# Patient Record
Sex: Female | Born: 1980 | Race: White | Hispanic: No | Marital: Married | Smoking: Former smoker
Health system: Southern US, Community
[De-identification: ages and names within clinical notes are randomized; demographics above are authoritative.]

## PROBLEM LIST (undated history)

## (undated) ENCOUNTER — Inpatient Hospital Stay (HOSPITAL_COMMUNITY): Payer: Self-pay

## (undated) DIAGNOSIS — Z803 Family history of malignant neoplasm of breast: Secondary | ICD-10-CM

## (undated) DIAGNOSIS — Z800 Family history of malignant neoplasm of digestive organs: Secondary | ICD-10-CM

## (undated) DIAGNOSIS — C50912 Malignant neoplasm of unspecified site of left female breast: Secondary | ICD-10-CM

## (undated) DIAGNOSIS — F419 Anxiety disorder, unspecified: Secondary | ICD-10-CM

## (undated) HISTORY — DX: Family history of malignant neoplasm of digestive organs: Z80.0

## (undated) HISTORY — DX: Family history of malignant neoplasm of breast: Z80.3

---
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
## 2000-05-28 HISTORY — PX: WISDOM TOOTH EXTRACTION: SHX21

## 2001-11-04 ENCOUNTER — Emergency Department (HOSPITAL_COMMUNITY): Admission: EM | Admit: 2001-11-04 | Discharge: 2001-11-04 | Payer: Self-pay | Admitting: Emergency Medicine

## 2002-06-02 ENCOUNTER — Other Ambulatory Visit: Admission: RE | Admit: 2002-06-02 | Discharge: 2002-06-02 | Payer: Self-pay | Admitting: Obstetrics and Gynecology

## 2003-07-14 ENCOUNTER — Other Ambulatory Visit: Admission: RE | Admit: 2003-07-14 | Discharge: 2003-07-14 | Payer: Self-pay | Admitting: Obstetrics and Gynecology

## 2004-08-29 ENCOUNTER — Other Ambulatory Visit: Admission: RE | Admit: 2004-08-29 | Discharge: 2004-08-29 | Payer: Self-pay | Admitting: Obstetrics and Gynecology

## 2005-09-17 ENCOUNTER — Other Ambulatory Visit: Admission: RE | Admit: 2005-09-17 | Discharge: 2005-09-17 | Payer: Self-pay | Admitting: Obstetrics and Gynecology

## 2006-12-18 ENCOUNTER — Emergency Department (HOSPITAL_COMMUNITY): Admission: EM | Admit: 2006-12-18 | Discharge: 2006-12-18 | Payer: Self-pay | Admitting: Emergency Medicine

## 2008-03-26 ENCOUNTER — Encounter: Payer: Self-pay | Admitting: Obstetrics and Gynecology

## 2008-03-26 ENCOUNTER — Other Ambulatory Visit: Admission: RE | Admit: 2008-03-26 | Discharge: 2008-03-26 | Payer: Self-pay | Admitting: Obstetrics and Gynecology

## 2008-03-26 ENCOUNTER — Ambulatory Visit: Payer: Self-pay | Admitting: Obstetrics and Gynecology

## 2009-06-09 ENCOUNTER — Other Ambulatory Visit: Admission: RE | Admit: 2009-06-09 | Discharge: 2009-06-09 | Payer: Self-pay | Admitting: Obstetrics and Gynecology

## 2009-06-09 ENCOUNTER — Ambulatory Visit: Payer: Self-pay | Admitting: Obstetrics and Gynecology

## 2010-06-12 ENCOUNTER — Other Ambulatory Visit
Admission: RE | Admit: 2010-06-12 | Discharge: 2010-06-12 | Payer: Self-pay | Source: Home / Self Care | Admitting: Obstetrics and Gynecology

## 2010-06-12 ENCOUNTER — Other Ambulatory Visit: Payer: Self-pay | Admitting: Obstetrics and Gynecology

## 2010-06-12 ENCOUNTER — Ambulatory Visit
Admission: RE | Admit: 2010-06-12 | Discharge: 2010-06-12 | Payer: Self-pay | Source: Home / Self Care | Attending: Obstetrics and Gynecology | Admitting: Obstetrics and Gynecology

## 2011-07-09 ENCOUNTER — Encounter: Payer: Self-pay | Admitting: Gynecology

## 2011-07-09 DIAGNOSIS — B001 Herpesviral vesicular dermatitis: Secondary | ICD-10-CM | POA: Insufficient documentation

## 2011-07-13 ENCOUNTER — Ambulatory Visit (INDEPENDENT_AMBULATORY_CARE_PROVIDER_SITE_OTHER): Payer: BC Managed Care – PPO | Admitting: Obstetrics and Gynecology

## 2011-07-13 ENCOUNTER — Encounter: Payer: Self-pay | Admitting: Obstetrics and Gynecology

## 2011-07-13 DIAGNOSIS — N912 Amenorrhea, unspecified: Secondary | ICD-10-CM

## 2011-07-13 NOTE — Progress Notes (Signed)
Patient came to see me today having not had a cycle since 05/28/2011. She is therefore about 7 weeks from the first day of her last period. She has not used contraception for one year. She had 3 positive pregnancy tests at home.  Pelvic exam: External within normal limits. BUS within normal limits. Vaginal exam within normal limits. Cervix is clean without lesions. Uterus is normal size and shape but soft.  Adnexa failed to reveal masses. Rectovaginal examination is confirmatory and without masses. Kennon Portela present.  Assessment: Probable early pregnancy.  Plan: Quantitative hCG done. We will call patient with results. Patient started on prenatal vitamins.

## 2011-07-14 LAB — HCG, QUANTITATIVE, PREGNANCY: hCG, Beta Chain, Quant, S: 1922.7 m[IU]/mL

## 2011-07-16 ENCOUNTER — Other Ambulatory Visit: Payer: Self-pay | Admitting: *Deleted

## 2011-07-16 DIAGNOSIS — N912 Amenorrhea, unspecified: Secondary | ICD-10-CM

## 2011-07-18 ENCOUNTER — Ambulatory Visit (INDEPENDENT_AMBULATORY_CARE_PROVIDER_SITE_OTHER): Payer: BC Managed Care – PPO

## 2011-07-18 ENCOUNTER — Ambulatory Visit (INDEPENDENT_AMBULATORY_CARE_PROVIDER_SITE_OTHER): Payer: BC Managed Care – PPO | Admitting: Obstetrics and Gynecology

## 2011-07-18 DIAGNOSIS — O365990 Maternal care for other known or suspected poor fetal growth, unspecified trimester, not applicable or unspecified: Secondary | ICD-10-CM

## 2011-07-18 DIAGNOSIS — N912 Amenorrhea, unspecified: Secondary | ICD-10-CM

## 2011-07-18 LAB — US OB TRANSVAGINAL

## 2011-07-18 NOTE — Progress Notes (Signed)
Patient came back to see me today for early pregnancy ultrasound. She is not sure of her LMP. She had a reassuring Quant. On ultrasound today she has a five-week four-day pregnancy. It is intrauterine. No fetal pole seen. No fetal heart motion seen. The yolk sac appears normal. The patient has a corpus luteal cyst on the right ovary. Her left ovary is normal. Her cul-de-sac is free of fluid.  Assessment: Early intrauterine pregnancy  Plan:  followup ultrasound in 2 weeks.

## 2011-08-01 ENCOUNTER — Ambulatory Visit (INDEPENDENT_AMBULATORY_CARE_PROVIDER_SITE_OTHER): Payer: BC Managed Care – PPO

## 2011-08-01 ENCOUNTER — Ambulatory Visit (INDEPENDENT_AMBULATORY_CARE_PROVIDER_SITE_OTHER): Payer: BC Managed Care – PPO | Admitting: Obstetrics and Gynecology

## 2011-08-01 DIAGNOSIS — N912 Amenorrhea, unspecified: Secondary | ICD-10-CM

## 2011-08-01 DIAGNOSIS — Z1389 Encounter for screening for other disorder: Secondary | ICD-10-CM

## 2011-08-01 DIAGNOSIS — Z3687 Encounter for antenatal screening for uncertain dates: Secondary | ICD-10-CM

## 2011-08-01 LAB — US OB TRANSVAGINAL

## 2011-08-01 NOTE — Progress Notes (Signed)
Patient came back today for followup ultrasound. She was unsure of her dates and we did her first ultrasound 2 weeks ago there was an intrauterine pregnancy without fetal heart activity. Today there is been 2 weeks of growth and the patient is 7 weeks 4 days by ultrasound. There is now good fetal heart rate activity. The cervix is long and closed. A fetal pole was seen. A normal yolk sac is seen. A corpus luteal cyst on the right ovary was seen. The left ovary is normal. The cul-de-sac is normal. Patient's EDC was calculated based on ultrasound to be 03/15/2012. Patient and her husband were informed of all the above. She is doing well. She was given names of obstetricians. She will make an appointment. We discussed noninvasive genetic testing and she will discuss it with her new obstetrician.

## 2011-08-07 LAB — OB RESULTS CONSOLE HEPATITIS B SURFACE ANTIGEN: Hepatitis B Surface Ag: NEGATIVE

## 2011-08-07 LAB — OB RESULTS CONSOLE ANTIBODY SCREEN: Antibody Screen: NEGATIVE

## 2011-08-07 LAB — OB RESULTS CONSOLE RPR: RPR: NONREACTIVE

## 2011-08-07 LAB — OB RESULTS CONSOLE ABO/RH: RH Type: POSITIVE

## 2011-11-19 ENCOUNTER — Encounter (HOSPITAL_COMMUNITY): Payer: Self-pay | Admitting: *Deleted

## 2011-11-19 ENCOUNTER — Inpatient Hospital Stay (HOSPITAL_COMMUNITY)
Admission: AD | Admit: 2011-11-19 | Discharge: 2011-11-19 | Disposition: A | Discharge status: Home / Self Care | Payer: BC Managed Care – PPO | Source: Ambulatory Visit | Attending: Obstetrics and Gynecology | Admitting: Obstetrics and Gynecology

## 2011-11-19 ENCOUNTER — Inpatient Hospital Stay (HOSPITAL_COMMUNITY): Payer: BC Managed Care – PPO

## 2011-11-19 DIAGNOSIS — R1011 Right upper quadrant pain: Secondary | ICD-10-CM | POA: Insufficient documentation

## 2011-11-19 DIAGNOSIS — O212 Late vomiting of pregnancy: Secondary | ICD-10-CM | POA: Insufficient documentation

## 2011-11-19 DIAGNOSIS — O99891 Other specified diseases and conditions complicating pregnancy: Secondary | ICD-10-CM | POA: Insufficient documentation

## 2011-11-19 HISTORY — DX: Anxiety disorder, unspecified: F41.9

## 2011-11-19 LAB — COMPREHENSIVE METABOLIC PANEL
Albumin: 3.1 g/dL — ABNORMAL LOW (ref 3.5–5.2)
Alkaline Phosphatase: 68 U/L (ref 39–117)
BUN: 7 mg/dL (ref 6–23)
Potassium: 4 mEq/L (ref 3.5–5.1)
Sodium: 133 mEq/L — ABNORMAL LOW (ref 135–145)
Total Protein: 6.3 g/dL (ref 6.0–8.3)

## 2011-11-19 LAB — CBC
HCT: 30.4 % — ABNORMAL LOW (ref 36.0–46.0)
MCHC: 34.5 g/dL (ref 30.0–36.0)
RDW: 13.1 % (ref 11.5–15.5)

## 2011-11-19 MED ORDER — DEXTROSE 5 % AND 0.9 % NACL IV BOLUS
1000.0000 mL | Freq: Once | INTRAVENOUS | Status: AC
  Administered 2011-11-19: 1000 mL via INTRAVENOUS
  Filled 2011-11-19: qty 1000

## 2011-11-19 MED ORDER — FAMOTIDINE IN NACL 20-0.9 MG/50ML-% IV SOLN
20.0000 mg | Freq: Once | INTRAVENOUS | Status: AC
  Administered 2011-11-19: 20 mg via INTRAVENOUS
  Filled 2011-11-19: qty 50

## 2011-11-19 MED ORDER — ONDANSETRON HCL 4 MG/2ML IJ SOLN
4.0000 mg | Freq: Once | INTRAMUSCULAR | Status: AC
  Administered 2011-11-19: 4 mg via INTRAVENOUS
  Filled 2011-11-19: qty 2

## 2011-11-19 MED ORDER — ACETAMINOPHEN 325 MG PO TABS
650.0000 mg | ORAL_TABLET | Freq: Once | ORAL | Status: AC
  Administered 2011-11-19: 650 mg via ORAL
  Filled 2011-11-19: qty 2

## 2011-11-19 NOTE — MAU Provider Note (Signed)
  History    CSN: [id number], Arrival date and time: 11/19/11 0220  HPI G1 at [redacted] wks gestation. C/o sudden onset RUQ pain, nausea vomiting x 3 episodes since last evening. Ate Mac/cheese and fried Chicken at 6 pm, noted pain since, tried Tums, Pepcid since felt like epigastric fullness and heartburn initially but moved to RUQ then. No fever/ chills.  No prior gall stones or liver problems.  [facility] at [facility] (Dr Billy Coast) since 1st trim.   Past Medical History  Diagnosis Date  . Fever blister   . Anxiety     Past Surgical History  Procedure Date  . Wisdom tooth extraction     Family History  Problem Relation Age of Onset  . Diabetes Mother     History  Substance Use Topics  . Smoking status: Current Everyday Smoker -- 0.5 packs/day  . Smokeless tobacco: Not on file  . Alcohol Use: No    Allergies: No Known Allergies  Prescriptions prior to admission  Medication Sig Dispense Refill  . calcium carbonate (TUMS - DOSED IN MG ELEMENTAL CALCIUM) 500 MG chewable tablet Chew 2 tablets by mouth 2 (two) times daily.      . Escitalopram Oxalate (LEXAPRO PO) Take 15 mg by mouth daily.       . Famotidine (PEPCID PO) Take by mouth.      . lansoprazole (PREVACID) 30 MG capsule Take 30 mg by mouth daily.      . Prenatal Vit-Fe Fumarate-FA (MULTIVITAMIN-PRENATAL) 27-0.8 MG TABS Take 1 tablet by mouth daily.      Marland Kitchen ALPRAZolam (XANAX) 0.5 MG tablet Take 0.5 mg by mouth at bedtime as needed.      . ValACYclovir HCl (VALTREX PO) Take by mouth.        ROS No fever/ chills.  Physical Exam   Blood pressure 141/69, pulse 81, temperature 97.9 F (36.6 C), temperature source Oral, resp. rate 18, height 5\' 5"  (1.651 m), weight 107.049 kg (236 lb), last menstrual period 05/28/2011, SpO2 100.00%.  Physical Exam  A&O x 3, no acute distress. Pleasant HEENT: neg, no thyromegaly Lungs: CTA bilat CV: RRR, S1S2 normal Abdo: RUQ tenderness, but Murphy's sign neg. No rebound/guarding/rigidity.  Uterus soft, gravid, no contractions Extr: no edema/ tenderness Pelvic: deferred.  FHT: Present Toco: none  MAU Course  Procedures CBC, CMP reviewed. Elevated WBC.  RUQ sono- Gall stone (small), no e/o acute cholecystitis.   Assessment and Plan  IV hydration with D5NS (2 lit) Zofran, Famotidine, NPO. RUQ sono- reviewed.  Tolerated PO challenge, pain decreased significantly.   Plan-  F/up Gen Surgeon in next few days (office will call pt back with appointment Bland, no fat diet reviewed.  Pain mngmt with Tylenol Reportable symptoms incl fever/ worsening pain/nausea -vomiting and Ob related symptoms incl contractions/ bleeding/ leaking of fluid/ decreased fetal movements reviewed. Risk of PTL with any infection reviewed, pt voiced understanding.    Kyaira Trantham R 11/19/2011, 6:48 AM

## 2011-11-19 NOTE — MAU Note (Signed)
PT SAYS SHE HAS  UPPER  ABD AND MID- CHEST PAIN AT 7 PM-  TOOK TUMS-  VOMITED.  AT 10PM - CALLED OFFICE  - TOLD TO TAKE PREVACID- SHE DID .  NOW    PAIN IS  UPPER R QUAD-

## 2011-11-19 NOTE — Discharge Instructions (Signed)
Cholecystitis   Cholecystitis is swelling and irritation (inflammation) of your gallbladder. This often happens when gallstones or sludge build up in the gallbladder. Treatment is needed right away.  HOME CARE  Home care depends on how you were treated. In general:   If you were given antibiotic medicine, take it as told. Finish the medicine even if you start to feel better.   Only take medicines as told by your doctor.   Eat low-fat foods until your next doctor visit.   Keep all doctor visits as told.  GET HELP RIGHT AWAY IF:   You have more pain and medicine does not help.   Your pain moves to a different part of your belly (abdomen) or to your back.   You have a fever.   You feel sick to your stomach (nauseous).   You throw up (vomit).  MAKE SURE YOU:   Understand these instructions.   Will watch your condition.   Will get help right away if you are not doing well or get worse.  Document Released: 05/03/2011 Document Reviewed: 05/01/2011  [employer] Patient Information 2012 [employer].

## 2011-11-20 ENCOUNTER — Encounter (INDEPENDENT_AMBULATORY_CARE_PROVIDER_SITE_OTHER): Payer: Self-pay | Admitting: General Surgery

## 2011-11-20 ENCOUNTER — Encounter (INDEPENDENT_AMBULATORY_CARE_PROVIDER_SITE_OTHER): Payer: Self-pay | Admitting: Surgery

## 2011-11-20 ENCOUNTER — Ambulatory Visit (INDEPENDENT_AMBULATORY_CARE_PROVIDER_SITE_OTHER): Payer: BC Managed Care – PPO | Admitting: Surgery

## 2011-11-20 VITALS — BP 132/74 | HR 72 | Temp 97.4°F | Resp 14 | Ht 66.5 in | Wt 230.0 lb

## 2011-11-20 DIAGNOSIS — K8020 Calculus of gallbladder without cholecystitis without obstruction: Secondary | ICD-10-CM | POA: Insufficient documentation

## 2011-11-20 NOTE — Progress Notes (Signed)
Patient ID: [name], female   DOB: 1981/03/14, 31 y.o.   MRN: [id number]  Chief Complaint  Patient presents with  . Cholelithiasis    HPI [name] is a 31 y.o. female.  She is referred by Dr. Billy Coast for symptomatic cholelithiasis. She is starting her 24 week pregnancy. She had an attack of right upper quadrant abdominal pain over the weekend with nausea vomiting and diarrhea. Gallstones were diagnosed on ultrasound. Since then she has been much better and only has minimal discomfort. The pain was sharp and did not refer any where else. It was moderate to severe when it began HPI  Past Medical History  Diagnosis Date  . Fever blister   . Anxiety   . Abdominal pain   . Diarrhea   . Nausea & vomiting   . Gallstones     Past Surgical History  Procedure Date  . Wisdom tooth extraction 2002    Family History  Problem Relation Age of Onset  . Diabetes Mother   . Cancer Mother     no sure of tupe, was on her shoulder blade    Social History History  Substance Use Topics  . Smoking status: Current Everyday Smoker -- 1.0 packs/day    Types: Cigarettes  . Smokeless tobacco: Never Used  . Alcohol Use: No    No Known Allergies  Current Outpatient Prescriptions  Medication Sig Dispense Refill  . calcium carbonate (TUMS - DOSED IN MG ELEMENTAL CALCIUM) 500 MG chewable tablet Chew 2 tablets by mouth as needed.       . Escitalopram Oxalate (LEXAPRO PO) Take 15 mg by mouth daily.       . lansoprazole (PREVACID) 30 MG capsule Take 30 mg by mouth daily.      . Prenatal Vit-Fe Fumarate-FA (MULTIVITAMIN-PRENATAL) 27-0.8 MG TABS Take 1 tablet by mouth daily.        Review of Systems Review of Systems  Constitutional: Negative for fever, chills and unexpected weight change.  HENT: Negative for hearing loss, congestion, sore throat, trouble swallowing and voice change.   Eyes: Negative for visual disturbance.  Respiratory: Negative for cough and wheezing.     Cardiovascular: Negative for chest pain, palpitations and leg swelling.  Gastrointestinal: Positive for nausea, vomiting, abdominal pain and diarrhea. Negative for constipation, blood in stool, abdominal distention and anal bleeding.  Genitourinary: Negative for hematuria, vaginal bleeding and difficulty urinating.  Musculoskeletal: Negative for arthralgias.  Skin: Negative for rash and wound.  Neurological: Negative for seizures, syncope and headaches.  Hematological: Negative for adenopathy. Does not bruise/bleed easily.  Psychiatric/Behavioral: Negative for confusion.    Blood pressure 132/74, pulse 72, temperature 97.4 F (36.3 C), temperature source Temporal, resp. rate 14, height 5' 6.5" (1.689 m), weight 230 lb (104.327 kg), last menstrual period 05/28/2011.  Physical Exam Physical Exam  Constitutional: She is oriented to person, place, and time. She appears well-developed and well-nourished. No distress.       Gravid  HENT:  Head: Normocephalic and atraumatic.  Right Ear: External ear normal.  Left Ear: External ear normal.  Nose: Nose normal.  Mouth/Throat: Oropharynx is clear and moist.  Eyes: Conjunctivae are normal. Right eye exhibits no discharge.  Neck: Normal range of motion. No tracheal deviation present. No thyromegaly present.  Cardiovascular: Normal rate, regular rhythm, normal heart sounds and intact distal pulses.   No murmur heard. Pulmonary/Chest: Effort normal and breath sounds normal. No respiratory distress. She has no wheezes. She has no rales.  Abdominal: Soft. Bowel sounds are normal.       Her abdomen is gravid. It is soft. There is minimal right upper quadrant tenderness and no guarding  Musculoskeletal: Normal range of motion. She exhibits no edema and no tenderness.  Lymphadenopathy:    She has no cervical adenopathy.  Neurological: She is alert and oriented to person, place, and time.  Skin: Skin is warm and dry. No rash noted. No erythema.   Psychiatric: Her behavior is normal. Judgment normal.    Data Reviewed Ultrasound shows cholelithiasis. There is no gallbladder wall thickening and the bile ducts are normal. Liver function tests are normal  Assessment    Symptomatic cholelithiasis    Plan    I am going to try to treat this conservatively and will start her on antibiotics. I will see her back in one week. Hopefully we can avoid cholecystectomy in the risk of preterm labor. Should she have another attack, she will proceed to [facility] and we will change plans as needed       Jalena Vanderlinden A 11/20/2011, 10:54 AM

## 2011-11-23 ENCOUNTER — Telehealth (INDEPENDENT_AMBULATORY_CARE_PROVIDER_SITE_OTHER): Payer: Self-pay | Admitting: General Surgery

## 2011-11-23 NOTE — Telephone Encounter (Signed)
Pt calling to report another GB attack last night.  No fever, but severe nausea and pain, lasting for about 2 hours.  (She was previously instructed by Dr. Magnus Ivan to go to ER at [facility] if attack was 4 hours or more.)  Paged and updated Dr. Magnus Ivan; no new orders received.  Called pt and told her no change in the plan to manage conservatively for as long as possible, preferably until post partem.  She understands and will keep [place] informed.

## 2011-11-27 ENCOUNTER — Ambulatory Visit (INDEPENDENT_AMBULATORY_CARE_PROVIDER_SITE_OTHER): Payer: BC Managed Care – PPO | Admitting: Surgery

## 2011-11-27 ENCOUNTER — Encounter (INDEPENDENT_AMBULATORY_CARE_PROVIDER_SITE_OTHER): Payer: Self-pay | Admitting: Surgery

## 2011-11-27 VITALS — BP 120/70 | HR 108 | Temp 97.2°F | Resp 20 | Ht 66.5 in | Wt 224.8 lb

## 2011-11-27 DIAGNOSIS — K8020 Calculus of gallbladder without cholecystitis without obstruction: Secondary | ICD-10-CM

## 2011-11-27 NOTE — Progress Notes (Signed)
Subjective:     Patient ID: [name], female   DOB: [birth year], 31 y.o.   MRN: [id number]  HPI  She is here for followup. She had one attack of symptomatically  Cholelithiasis last week. The pain was moderate to severe and lasted approximately 2 hours. She has felt fine since then. Review of Systems     Objective:   Physical Exam Today her abdomen is soft and nontender    Assessment:     Symptomatically cholelithiasis in a pregnant patient    Plan:     She will be meeting her obstetrician today. We are still deciding whether to proceed with cholecystectomy next week versus still trying to wait it out until after the pregnancy

## 2011-11-28 ENCOUNTER — Telehealth (INDEPENDENT_AMBULATORY_CARE_PROVIDER_SITE_OTHER): Payer: Self-pay | Admitting: General Surgery

## 2011-11-28 ENCOUNTER — Other Ambulatory Visit (INDEPENDENT_AMBULATORY_CARE_PROVIDER_SITE_OTHER): Payer: Self-pay | Admitting: Surgery

## 2011-11-28 NOTE — Telephone Encounter (Signed)
Patient called status post office visit yesterday with Dr Magnus Ivan re: cholecystitis. She spoke with her OBGYN Dr Billy Coast who advised she can proceed with surgery as long as it is by next week since she will go into her third trimester after that. She has called re: getting surgery set up with no reply. Please advise if she can proceed with surgery next week.

## 2011-11-28 NOTE — Telephone Encounter (Signed)
Orders and posting in Epic

## 2011-12-02 ENCOUNTER — Encounter (HOSPITAL_COMMUNITY): Payer: Self-pay

## 2011-12-06 ENCOUNTER — Encounter (HOSPITAL_COMMUNITY)
Admission: RE | Admit: 2011-12-06 | Discharge: 2011-12-06 | Disposition: A | Discharge status: Home / Self Care | Payer: BC Managed Care – PPO | Source: Ambulatory Visit | Attending: Surgery | Admitting: Surgery

## 2011-12-06 ENCOUNTER — Encounter (HOSPITAL_COMMUNITY): Payer: Self-pay

## 2011-12-06 LAB — CBC
Hemoglobin: 11.3 g/dL — ABNORMAL LOW (ref 12.0–15.0)
MCH: 30.1 pg (ref 26.0–34.0)
RBC: 3.75 MIL/uL — ABNORMAL LOW (ref 3.87–5.11)

## 2011-12-06 LAB — SURGICAL PCR SCREEN: MRSA, PCR: NEGATIVE

## 2011-12-06 NOTE — Pre-Procedure Instructions (Signed)
Patient is [redacted] wks gestation with gallstones for a lap chole tomorrow. Per Dr Malen Gauze ok to see patient DOS.  Patient is healthy.

## 2011-12-06 NOTE — Patient Instructions (Addendum)
   Your procedure is scheduled on: Friday, July 12th  Enter through the Main Entrance of [facility] at: 8:30am Pick up the phone at the desk and dial [phone] and inform us of your arrival.  Please call this number if you have any problems the morning of surgery: [phone]  Remember: Do not eat food after midnight: Thursday Do not drink clear liquids after: Thursday Take these medicines the morning of surgery with a SIP OF WATER: Dispirone if needed with a sip of water on DOS  Do not wear jewelry, make-up, or FINGER nail polish No metal in your hair or on your body. Do not wear lotions, powders, perfumes or deodorant. Do not shave 48 hours prior to surgery. Do not bring valuables to the hospital. Contacts, dentures or bridgework may not be worn into surgery.  Leave suitcase in the car. After Surgery it may be brought to your room. For patients being admitted to the hospital, checkout time is 11:00am the day of discharge. Home with Husband [name] cell [phone]  Patients discharged on the day of surgery will not be allowed to drive home.     Remember to use your hibiclens as instructed.Please shower with 1/2 bottle the evening before your surgery and the other 1/2 bottle the morning of surgery. Neck down avoiding private area.

## 2011-12-07 ENCOUNTER — Ambulatory Visit (HOSPITAL_COMMUNITY): Payer: BC Managed Care – PPO | Admitting: Anesthesiology

## 2011-12-07 ENCOUNTER — Encounter (HOSPITAL_COMMUNITY): Payer: Self-pay | Admitting: Anesthesiology

## 2011-12-07 ENCOUNTER — Ambulatory Visit (INDEPENDENT_AMBULATORY_CARE_PROVIDER_SITE_OTHER): Payer: BC Managed Care – PPO | Admitting: General Surgery

## 2011-12-07 ENCOUNTER — Encounter (HOSPITAL_COMMUNITY)
Admission: RE | Disposition: A | Discharge status: Home / Self Care | Payer: Self-pay | Source: Ambulatory Visit | Attending: Surgery

## 2011-12-07 ENCOUNTER — Other Ambulatory Visit: Payer: Self-pay | Admitting: Obstetrics and Gynecology

## 2011-12-07 ENCOUNTER — Ambulatory Visit (HOSPITAL_COMMUNITY)
Admission: RE | Admit: 2011-12-07 | Discharge: 2011-12-07 | Disposition: A | Discharge status: Home / Self Care | Payer: BC Managed Care – PPO | Source: Ambulatory Visit | Attending: Surgery | Admitting: Surgery

## 2011-12-07 DIAGNOSIS — K8066 Calculus of gallbladder and bile duct with acute and chronic cholecystitis without obstruction: Secondary | ICD-10-CM

## 2011-12-07 DIAGNOSIS — Z01812 Encounter for preprocedural laboratory examination: Secondary | ICD-10-CM | POA: Insufficient documentation

## 2011-12-07 DIAGNOSIS — Z01818 Encounter for other preprocedural examination: Secondary | ICD-10-CM | POA: Insufficient documentation

## 2011-12-07 DIAGNOSIS — K8020 Calculus of gallbladder without cholecystitis without obstruction: Secondary | ICD-10-CM

## 2011-12-07 DIAGNOSIS — O9989 Other specified diseases and conditions complicating pregnancy, childbirth and the puerperium: Secondary | ICD-10-CM | POA: Insufficient documentation

## 2011-12-07 DIAGNOSIS — K8000 Calculus of gallbladder with acute cholecystitis without obstruction: Secondary | ICD-10-CM | POA: Insufficient documentation

## 2011-12-07 HISTORY — PX: CHOLECYSTECTOMY: SHX55

## 2011-12-07 SURGERY — LAPAROSCOPIC CHOLECYSTECTOMY
Anesthesia: General | Site: Abdomen | Wound class: Clean Contaminated

## 2011-12-07 MED ORDER — FENTANYL CITRATE 0.05 MG/ML IJ SOLN
INTRAMUSCULAR | Status: AC
  Filled 2011-12-07: qty 2

## 2011-12-07 MED ORDER — PROPOFOL 10 MG/ML IV EMUL
INTRAVENOUS | Status: DC | PRN
  Administered 2011-12-07: 80 mg via INTRAVENOUS
  Administered 2011-12-07 (×2): 20 mg via INTRAVENOUS
  Administered 2011-12-07: 180 mg via INTRAVENOUS
  Administered 2011-12-07: 80 mg via INTRAVENOUS

## 2011-12-07 MED ORDER — EPHEDRINE 5 MG/ML INJ
INTRAVENOUS | Status: AC
  Filled 2011-12-07: qty 10

## 2011-12-07 MED ORDER — NEOSTIGMINE METHYLSULFATE 1 MG/ML IJ SOLN
INTRAMUSCULAR | Status: DC | PRN
  Administered 2011-12-07: 2 mg via INTRAVENOUS
  Administered 2011-12-07: 1 mg via INTRAVENOUS

## 2011-12-07 MED ORDER — BUPIVACAINE HCL (PF) 0.5 % IJ SOLN
INTRAMUSCULAR | Status: AC
  Filled 2011-12-07: qty 30

## 2011-12-07 MED ORDER — ROCURONIUM BROMIDE 100 MG/10ML IV SOLN
INTRAVENOUS | Status: DC | PRN
  Administered 2011-12-07: 5 mg via INTRAVENOUS
  Administered 2011-12-07 (×2): 10 mg via INTRAVENOUS

## 2011-12-07 MED ORDER — HYDROCODONE-ACETAMINOPHEN 5-325 MG PO TABS
ORAL_TABLET | ORAL | Status: AC
  Filled 2011-12-07: qty 1

## 2011-12-07 MED ORDER — CEFAZOLIN SODIUM-DEXTROSE 2-3 GM-% IV SOLR
INTRAVENOUS | Status: AC
  Filled 2011-12-07: qty 50

## 2011-12-07 MED ORDER — HYDROMORPHONE HCL PF 1 MG/ML IJ SOLN
0.2500 mg | INTRAMUSCULAR | Status: DC | PRN
  Administered 2011-12-07 (×2): 0.5 mg via INTRAVENOUS

## 2011-12-07 MED ORDER — PROPOFOL 10 MG/ML IV EMUL
INTRAVENOUS | Status: AC
  Filled 2011-12-07: qty 20

## 2011-12-07 MED ORDER — PROMETHAZINE HCL 25 MG/ML IJ SOLN: 12.5000 mg | INTRAMUSCULAR | Status: DC | PRN

## 2011-12-07 MED ORDER — RINGERS IRRIGATION IR SOLN
Status: DC | PRN
  Administered 2011-12-07: 1

## 2011-12-07 MED ORDER — ROCURONIUM BROMIDE 50 MG/5ML IV SOLN
INTRAVENOUS | Status: AC
  Filled 2011-12-07: qty 1

## 2011-12-07 MED ORDER — CEFAZOLIN SODIUM-DEXTROSE 2-3 GM-% IV SOLR
2.0000 g | INTRAVENOUS | Status: AC
  Administered 2011-12-07: 2 g via INTRAVENOUS

## 2011-12-07 MED ORDER — FENTANYL CITRATE 0.05 MG/ML IJ SOLN
25.0000 ug | INTRAMUSCULAR | Status: DC | PRN
  Administered 2011-12-07: 50 ug via INTRAVENOUS

## 2011-12-07 MED ORDER — FENTANYL CITRATE 0.05 MG/ML IJ SOLN
INTRAMUSCULAR | Status: AC
  Filled 2011-12-07: qty 5

## 2011-12-07 MED ORDER — PHENYLEPHRINE 40 MCG/ML (10ML) SYRINGE FOR IV PUSH (FOR BLOOD PRESSURE SUPPORT)
PREFILLED_SYRINGE | INTRAVENOUS | Status: AC
  Filled 2011-12-07: qty 5

## 2011-12-07 MED ORDER — ONDANSETRON HCL 4 MG/2ML IJ SOLN
INTRAMUSCULAR | Status: AC
  Filled 2011-12-07: qty 2

## 2011-12-07 MED ORDER — SUCCINYLCHOLINE CHLORIDE 20 MG/ML IJ SOLN
INTRAMUSCULAR | Status: DC | PRN
  Administered 2011-12-07: 140 mg via INTRAVENOUS

## 2011-12-07 MED ORDER — PHENYLEPHRINE HCL 10 MG/ML IJ SOLN
INTRAMUSCULAR | Status: DC | PRN
  Administered 2011-12-07 (×2): 80 ug via INTRAVENOUS

## 2011-12-07 MED ORDER — SUCCINYLCHOLINE CHLORIDE 20 MG/ML IJ SOLN
INTRAMUSCULAR | Status: AC
  Filled 2011-12-07: qty 10

## 2011-12-07 MED ORDER — ATROPINE SULFATE 0.4 MG/ML IJ SOLN
INTRAMUSCULAR | Status: AC
  Filled 2011-12-07: qty 1

## 2011-12-07 MED ORDER — HYDROCODONE-ACETAMINOPHEN 5-325 MG PO TABS: 1.0000 | ORAL_TABLET | ORAL | Status: AC | PRN

## 2011-12-07 MED ORDER — LIDOCAINE HCL (CARDIAC) 20 MG/ML IV SOLN
INTRAVENOUS | Status: AC
  Filled 2011-12-07: qty 5

## 2011-12-07 MED ORDER — GLYCOPYRROLATE 0.2 MG/ML IJ SOLN
INTRAMUSCULAR | Status: DC | PRN
  Administered 2011-12-07: 0.4 mg via INTRAVENOUS
  Administered 2011-12-07: 0.2 mg via INTRAVENOUS

## 2011-12-07 MED ORDER — ROCURONIUM BROMIDE 100 MG/10ML IV SOLN: INTRAVENOUS | Status: DC | PRN

## 2011-12-07 MED ORDER — LACTATED RINGERS IV SOLN
INTRAVENOUS | Status: DC
  Administered 2011-12-07 (×3): via INTRAVENOUS

## 2011-12-07 MED ORDER — HYDROCODONE-ACETAMINOPHEN 5-325 MG PO TABS
1.0000 | ORAL_TABLET | Freq: Once | ORAL | Status: AC
  Administered 2011-12-07: 1 via ORAL

## 2011-12-07 MED ORDER — HYDROMORPHONE HCL PF 1 MG/ML IJ SOLN
INTRAMUSCULAR | Status: AC
  Administered 2011-12-07: 0.5 mg via INTRAVENOUS
  Filled 2011-12-07: qty 1

## 2011-12-07 MED ORDER — EPHEDRINE SULFATE 50 MG/ML IJ SOLN
INTRAMUSCULAR | Status: DC | PRN
  Administered 2011-12-07 (×4): 10 mg via INTRAVENOUS

## 2011-12-07 MED ORDER — FENTANYL CITRATE 0.05 MG/ML IJ SOLN
INTRAMUSCULAR | Status: DC | PRN
  Administered 2011-12-07: 100 ug via INTRAVENOUS
  Administered 2011-12-07: 50 ug via INTRAVENOUS
  Administered 2011-12-07: 100 ug via INTRAVENOUS
  Administered 2011-12-07: 25 ug via INTRAVENOUS
  Administered 2011-12-07: 50 ug via INTRAVENOUS

## 2011-12-07 MED ORDER — BUPIVACAINE HCL (PF) 0.5 % IJ SOLN
INTRAMUSCULAR | Status: DC | PRN
  Administered 2011-12-07: 20 mL

## 2011-12-07 MED ORDER — FENTANYL CITRATE 0.05 MG/ML IJ SOLN
INTRAMUSCULAR | Status: AC
  Administered 2011-12-07: 50 ug via INTRAVENOUS
  Filled 2011-12-07: qty 2

## 2011-12-07 SURGICAL SUPPLY — 35 items
APL SKNCLS STERI-STRIP NONHPOA (GAUZE/BANDAGES/DRESSINGS) ×2
APPLIER CLIP ROT 10 11.4 M/L (STAPLE) ×3
APR CLP MED LRG 11.4X10 (STAPLE) ×2
BAG SPEC RTRVL LRG 6X4 10 (ENDOMECHANICALS) ×2
BANDAGE ADHESIVE 1X3 (GAUZE/BANDAGES/DRESSINGS) ×8 IMPLANT
BENZOIN TINCTURE PRP APPL 2/3 (GAUZE/BANDAGES/DRESSINGS) ×3 IMPLANT
CABLE HIGH FREQUENCY MONO STRZ (ELECTRODE) ×3 IMPLANT
CANISTER SUCTION 2500CC (MISCELLANEOUS) ×3 IMPLANT
CHLORAPREP W/TINT 26ML (MISCELLANEOUS) ×3 IMPLANT
CLIP APPLIE ROT 10 11.4 M/L (STAPLE) ×2 IMPLANT
CLOTH BEACON ORANGE TIMEOUT ST (SAFETY) ×3 IMPLANT
COVER MAYO STAND STRL (DRAPES) ×2 IMPLANT
DECANTER SPIKE VIAL GLASS SM (MISCELLANEOUS) ×3 IMPLANT
DRAPE LAPAROSCOPIC ABDOMINAL (DRAPES) ×3 IMPLANT
DRSG COVADERM PLUS 2X2 (GAUZE/BANDAGES/DRESSINGS) ×3 IMPLANT
ELECT REM PT RETURN 9FT ADLT (ELECTROSURGICAL) ×3
ELECTRODE REM PT RTRN 9FT ADLT (ELECTROSURGICAL) ×2 IMPLANT
EVACUATOR SMOKE 8.L (FILTER) ×2 IMPLANT
GLOVE SURG SIGNA 7.5 PF LTX (GLOVE) ×6 IMPLANT
GOWN STRL NON-REIN LRG LVL3 (GOWN DISPOSABLE) ×3 IMPLANT
GOWN STRL REIN XL XLG (GOWN DISPOSABLE) ×6 IMPLANT
HEMOSTAT SURGICEL 4X8 (HEMOSTASIS) ×3 IMPLANT
KIT BASIN OR (CUSTOM PROCEDURE TRAY) ×3 IMPLANT
NS IRRIG 1000ML POUR BTL (IV SOLUTION) ×2 IMPLANT
POUCH SPECIMEN RETRIEVAL 10MM (ENDOMECHANICALS) ×2 IMPLANT
SCISSORS LAP 5X35 DISP (ENDOMECHANICALS) ×3 IMPLANT
SET IRRIG TUBING LAPAROSCOPIC (IRRIGATION / IRRIGATOR) ×3 IMPLANT
SOLUTION ANTI FOG 6CC (MISCELLANEOUS) ×3 IMPLANT
STRIP CLOSURE SKIN 1/2X4 (GAUZE/BANDAGES/DRESSINGS) ×3 IMPLANT
SUT MNCRL AB 4-0 PS2 18 (SUTURE) ×3 IMPLANT
SUT VICRYL 0 UR6 27IN ABS (SUTURE) ×3 IMPLANT
TOWEL OR 17X24 6PK STRL BLUE (TOWEL DISPOSABLE) ×4 IMPLANT
TRAY LAP CHOLE (CUSTOM PROCEDURE TRAY) ×3 IMPLANT
TROCAR BLADELESS OPT 5 75 (ENDOMECHANICALS) ×9 IMPLANT
TROCAR XCEL BLUNT TIP 100MML (ENDOMECHANICALS) ×3 IMPLANT

## 2011-12-07 NOTE — Progress Notes (Signed)
Dr. Billy Coast called to assess EFM tracing. Informed of baseline 135 with min/mod variability, with occasional 20 sec variable decel down to 110. No active contractions noted on EFM nor pt reports any. Dr. Billy Coast stated he would be around in 20-30 min to evaluate pt. And EFM tracing

## 2011-12-07 NOTE — Interval H&P Note (Signed)
History and Physical Interval Note:  12/07/2011 9:32 AM  [name]  has presented today for surgery, with the diagnosis of gallstones  The various methods of treatment have been discussed with the patient and family. After consideration of risks, benefits and other options for treatment, the patient has consented to  Procedure(s) (LRB): LAPAROSCOPIC CHOLECYSTECTOMY WITH INTRAOPERATIVE CHOLANGIOGRAM (N/A) as a surgical intervention .  The patient's history has been reviewed, patient examined, no change in status, stable for surgery.  I have reviewed the patients' chart and labs.  Questions were answered to the patient's satisfaction.     [name] A

## 2011-12-07 NOTE — H&P (Signed)
[name], [name]              ACCOUNT NO.:  [id number]  MEDICAL RECORD NO.:  [id number]  LOCATION:  [facility]                          FACILITY:  [facility]  PHYSICIAN:  [name], M.D.DATE OF BIRTH:  Jan 31, 1981  DATE OF ADMISSION:  12/07/2011 DATE OF DISCHARGE:  12/07/2011                             HISTORY & PHYSICAL   CHIEF COMPLAINT:  The patient is status post cholecystectomy at 25 weeks for obstetric followup.  HISTORY OF PRESENT ILLNESS:  She is a 31 year old white female, G1, P0 at 40 and [redacted] weeks gestation who presented with acute cholecystitis and is status post uncomplicated laparoscopic cholecystectomy by Dr. Magnus Ivan today.  Discussion with Dr. Magnus Ivan after the procedure reveals no evidence of complication but evidence of acute cholecystitis.  ALLERGIES:  She has no known drug allergies.  MEDICATIONS:  Zofran as needed, Lexapro, prenatal vitamins.  SOCIAL HISTORY:  She is a current smoker, less than a pack a day.  She denies domestic or physical violence.  FAMILY HISTORY:  She has a family history of heart disease, diabetes, and bone cancer.  This is her first pregnancy.  SURGICAL HISTORY:  She has a surgical history prior to today remarkable for wisdom tooth removal.  PHYSICAL EXAMINATION:  VITAL SIGNS:  Stable. GENERAL:  The patient is a well-developed, well-nourished, white female, in no acute distress. HEENT:  Normal. NECK:  Supple.  Full range of motion. LUNGS:  Clear. HEART:  Regular rhythm. ABDOMEN:  Soft, gravid, nontender.  Surgical incisions noted. EXTREMITIES:  No cords. NEUROLOGIC:  Nonfocal. SKIN:  Intact. PELVIC:  Deferred.  NST performed postoperatively for an hour reveals the fetal heart tones in the normal range of 120-130 beats per minute with fair beat-to-beat variability, occasional mild variable deceleration noted, no contractions noted.  IMPRESSION:  A 25 week intrauterine pregnancy, status post uncomplicated laparoscopic  cholecystectomy.  No evidence of preterm labor.  PLAN:  Discharge home.  Preterm labor warnings given.  Follow up in the office within 1 week.  Fetal activity discussed.     [name], M.D.     RJT/MEDQ  D:  12/07/2011  T:  12/07/2011  Job:  [id number]

## 2011-12-07 NOTE — H&P (Signed)
Chief Complaint   Patient presents with   .  Cholelithiasis    HPI  [name] is [name] 31 y.o. female. She is referred by Dr. Billy Coast for symptomatic cholelithiasis. She is starting her 26th or 27th week ofpregnancy. She had an attack of right upper quadrant abdominal pain over the weekend with nausea vomiting and diarrhea. Gallstones were diagnosed on ultrasound. Since then she has been much better and only has minimal discomfort. The pain was sharp and did not refer any where else. It was moderate to severe when it began.  she continues to have persistent attacks throughout her pregnancyN. Did not appear to be able to make it to term without cholecystectomy  HPI  Past Medical History   Diagnosis  Date   .  Fever blister    .  Anxiety    .  Abdominal pain    .  Diarrhea    .  Nausea & vomiting    .  Gallstones     Past Surgical History   Procedure  Date   .  Wisdom tooth extraction  2002    Family History   Problem  Relation  Age of Onset   .  Diabetes  Mother    .  Cancer  Mother       no sure of tupe, was on her shoulder blade    Social History  History   Substance Use Topics   .  Smoking status:  Current Everyday Smoker -- 1.0 packs/day     Types:  Cigarettes   .  Smokeless tobacco:  Never Used   .  Alcohol Use:  No    No Known Allergies  Current Outpatient Prescriptions   Medication  Sig  Dispense  Refill   .  calcium carbonate (TUMS - DOSED IN MG ELEMENTAL CALCIUM) 500 MG chewable tablet  Chew 2 tablets by mouth as needed.     .  Escitalopram Oxalate (LEXAPRO PO)  Take 15 mg by mouth daily.     .  lansoprazole (PREVACID) 30 MG capsule  Take 30 mg by mouth daily.     .  Prenatal Vit-Fe Fumarate-FA (MULTIVITAMIN-PRENATAL) 27-0.8 MG TABS  Take 1 tablet by mouth daily.      Review of Systems  Review of Systems  Constitutional: Negative for fever, chills and unexpected weight change.  HENT: Negative for hearing loss, congestion, sore throat, trouble swallowing and  voice change.  Eyes: Negative for visual disturbance.  Respiratory: Negative for cough and wheezing.  Cardiovascular: Negative for chest pain, palpitations and leg swelling.  Gastrointestinal: Positive for nausea, vomiting, abdominal pain and diarrhea. Negative for constipation, blood in stool, abdominal distention and anal bleeding.  Genitourinary: Negative for hematuria, vaginal bleeding and difficulty urinating.  Musculoskeletal: Negative for arthralgias.  Skin: Negative for rash and wound.  Neurological: Negative for seizures, syncope and headaches.  Hematological: Negative for adenopathy. Does not bruise/bleed easily.  Psychiatric/Behavioral: Negative for confusion.   Blood pressure 132/74, pulse 72, temperature 97.4 F (36.3 C), temperature source Temporal, resp. rate 14, height 5' 6.5" (1.689 m), weight 230 lb (104.327 kg), last menstrual period 05/28/2011.  Physical Exam  Physical Exam  Constitutional: She is oriented to person, place, and time. She appears well-developed and well-nourished. No distress.  Gravid  HENT:  Head: Normocephalic and atraumatic.  Right Ear: External ear normal.  Left Ear: External ear normal.  Nose: Nose normal.  Mouth/Throat: Oropharynx is clear and moist.  Eyes: Conjunctivae are normal.  Right eye exhibits no discharge.  Neck: Normal range of motion. No tracheal deviation present. No thyromegaly present.  Cardiovascular: Normal rate, regular rhythm, normal heart sounds and intact distal pulses.  No murmur heard.  Pulmonary/Chest: Effort normal and breath sounds normal. No respiratory distress. She has no wheezes. She has no rales.  Abdominal: Soft. Bowel sounds are normal.  Her abdomen is gravid. It is soft. There is minimal right upper quadrant tenderness and no guarding  Musculoskeletal: Normal range of motion. She exhibits no edema and no tenderness.  Lymphadenopathy:  She has no cervical adenopathy.  Neurological: She is alert and oriented to  person, place, and time.  Skin: Skin is warm and dry. No rash noted. No erythema.  Psychiatric: Her behavior is normal. Judgment normal.   Data Reviewed  Ultrasound shows cholelithiasis. There is no gallbladder wall thickening and the bile ducts are normal. Liver function tests are normal  Assessment   Symptomatic cholelithiasis   Plan   Because of her persistent attacks of biliary colic, she was to go ahead and proceed with cholecystectomy prior to reaching term with her pregnancy. Dr. Billy Coast is in agreement. I discussed the procedure with her in detail. I discussed the risks of surgery which includes but is not limited to bleeding, infection, bile duct injury, bile leak, injury to other structures, need to convert to an open procedure, preterm labor, etc. She understands and wishes to proceed. Surgery is scheduled  [name]

## 2011-12-07 NOTE — Progress Notes (Signed)
Patient ID: [name], female   DOB: [birth year], 31 y.o.   MRN: [id number] Consulted to see pt s/p LS choly per Dr. Magnus Ivan Postoperatively, stable. FHT 120-140 with occ mild variable. BTBV 5-25. No contractions noted after 1 hr monitoring. No history of pregnancy complications. Pain management discussed. Fu in office next week. PTL precautions. Note dictated.

## 2011-12-07 NOTE — Anesthesia Preprocedure Evaluation (Signed)
Anesthesia Evaluation  Patient identified by MRN, date of birth, ID band Patient awake    Reviewed: Allergy & Precautions, H&P , Patient's Chart, lab work & pertinent test results, reviewed documented beta blocker date and time   Airway Mallampati: III TM Distance: >3 FB Neck ROM: full    Dental No notable dental hx.    Pulmonary  breath sounds clear to auscultation  Pulmonary exam normal       Cardiovascular Rhythm:regular Rate:Normal     Neuro/Psych    GI/Hepatic   Endo/Other    Renal/GU      Musculoskeletal   Abdominal   Peds  Hematology   Anesthesia Other Findings   Reproductive/Obstetrics                           Anesthesia Physical Anesthesia Plan  ASA: III  Anesthesia Plan: General   Post-op Pain Management:    Induction: Intravenous, Rapid sequence and Cricoid pressure planned  Airway Management Planned: Oral ETT  Additional Equipment:   Intra-op Plan:   Post-operative Plan:   Informed Consent: I have reviewed the patients History and Physical, chart, labs and discussed the procedure including the risks, benefits and alternatives for the proposed anesthesia with the patient or authorized representative who has indicated his/her understanding and acceptance.   Dental Advisory Given and Dental advisory given  Plan Discussed with: CRNA and Surgeon  Anesthesia Plan Comments: (  Discussed  general anesthesia, including possible nausea, instrumentation of airway, sore throat,pulmonary aspiration, etc. I asked if the were any outstanding questions, or  concerns before we proceeded. )        Anesthesia Quick Evaluation

## 2011-12-07 NOTE — Op Note (Signed)
Laparoscopic Cholecystectomy Procedure Note  Indications: This patient presents with symptomatic gallbladder disease and will undergo laparoscopic cholecystectomy.  approx [redacted] weeks pregnant  Pre-operative Diagnosis: Calculus of gallbladder without mention of cholecystitis or obstruction  Post-operative Diagnosis: Calculus of gallbladder with other cholecystitis, without mention of obstruction  Surgeon: Abigail Miyamoto A   Assistants: 0  Anesthesia: General endotracheal anesthesia  ASA Class: 3  Procedure Details  The patient was seen again in the Holding Room. The risks, benefits, complications, treatment options, and expected outcomes were discussed with the patient. The possibilities of reaction to medication, pulmonary aspiration, perforation of viscus, bleeding, recurrent infection, finding a normal gallbladder, the need for additional procedures, failure to diagnose a condition, the possible need to convert to an open procedure, and creating a complication requiring transfusion or operation were discussed with the patient. The likelihood of improving the patient's symptoms with return to their baseline status is good.  The patient and/or family concurred with the proposed plan, giving informed consent. The site of surgery properly noted. The patient was taken to Operating Room, identified as Shane Crutch and the procedure verified as Laparoscopic Cholecystectomy with Intraoperative Cholangiogram. A Time Out was held and the above information confirmed.  Prior to the induction of general anesthesia, antibiotic prophylaxis was administered. General endotracheal anesthesia was then administered and tolerated well. After the induction, the abdomen was prepped with Chloraprep and draped in sterile fashion. The patient was positioned in the supine position.  Local anesthetic agent was injected into the skin near the umbilicus and an incision made. We dissected down to the abdominal fascia  with blunt dissection.  The fascia was incised vertically and we entered the peritoneal cavity bluntly.  A pursestring suture of 0-Vicryl was placed around the fascial opening.  The Hasson cannula was inserted and secured with the stay suture.  Pneumoperitoneum was then created with CO2 and tolerated well without any adverse changes in the patient's vital signs. An 11-mm port was placed in the subxiphoid position.  Two 5-mm ports were placed in the right upper quadrant. All skin incisions were infiltrated with a local anesthetic agent before making the incision and placing the trocars. The uterus was visualized and appeared without evidence of injury.  Uterus was gravid.  We positioned the patient in reverse Trendelenburg, tilted slightly to the patient's left.  The gallbladder was identified, the fundus grasped and retracted cephalad. Adhesions were lysed bluntly and with the electrocautery where indicated, taking care not to injure any adjacent organs or viscus. The infundibulum was grasped and retracted laterally, exposing the peritoneum overlying the triangle of Calot. This was then divided and exposed in a blunt fashion. The cystic duct was clearly identified and bluntly dissected circumferentially. A critical view of the cystic duct and cystic artery was obtained.  The cystic duct was then ligated with clips and divided. The cystic artery was, dissected free, ligated with clips and divided as well.   The gallbladder was dissected from the liver bed in retrograde fashion with the electrocautery. The gallbladder was removed and placed in an Endocatch sac. The liver bed was irrigated and inspected. Hemostasis was achieved with the electrocautery. Copious irrigation was utilized and was repeatedly aspirated until clear.  The gallbladder and Endocatch sac were then removed through the umbilical port site.  The pursestring suture was used to close the umbilical fascia.    We again inspected the right upper  quadrant for hemostasis.  Pneumoperitoneum was released as we removed the trocars.  4-0  Monocryl was used to close the skin.   Benzoin, steri-strips, and clean dressings were applied. The patient was then extubated and brought to the recovery room in stable condition. Instrument, sponge, and needle counts were correct at closure and at the conclusion of the case.   Findings: Cholecystitis with Cholelithiasis  Estimated Blood Loss: Minimal         Drains: 0         Specimens: Gallbladder           Complications: None; patient tolerated the procedure well.         Disposition: PACU - hemodynamically stable.         Condition: stable

## 2011-12-07 NOTE — Transfer of Care (Signed)
Immediate Anesthesia Transfer of Care Note  Patient: [name]  Procedure(s) Performed: Procedure(s) (LRB): LAPAROSCOPIC CHOLECYSTECTOMY (N/A)  Patient Location: PACU  Anesthesia Type: General  Level of Consciousness: sedated  Airway & Oxygen Therapy: Patient Spontanous Breathing and Patient connected to nasal cannula oxygen  Post-op Assessment: Report given to PACU RN  Post vital signs: Reviewed and stable  Complications: No apparent anesthesia complications

## 2011-12-07 NOTE — Anesthesia Postprocedure Evaluation (Signed)
  Anesthesia Post-op Note  Patient: [name]  Procedure(s) Performed: Procedure(s) (LRB): LAPAROSCOPIC CHOLECYSTECTOMY (N/A)  Patient Location: PACU  Anesthesia Type: General  Level of Consciousness: awake, alert  and oriented  Airway and Oxygen Therapy: Patient Spontanous Breathing  Post-op Pain: mild  Post-op Assessment: Post-op Vital signs reviewed, Patient's Cardiovascular Status Stable, Respiratory Function Stable, Patent Airway, No signs of Nausea or vomiting and Pain level controlled  Post-op Vital Signs: Reviewed and stable  Complications: No apparent anesthesia complications

## 2011-12-10 ENCOUNTER — Encounter (HOSPITAL_COMMUNITY): Payer: Self-pay | Admitting: Surgery

## 2011-12-26 ENCOUNTER — Ambulatory Visit (INDEPENDENT_AMBULATORY_CARE_PROVIDER_SITE_OTHER): Payer: BC Managed Care – PPO | Admitting: Surgery

## 2011-12-26 ENCOUNTER — Encounter (INDEPENDENT_AMBULATORY_CARE_PROVIDER_SITE_OTHER): Payer: Self-pay | Admitting: Surgery

## 2011-12-26 VITALS — BP 128/84 | HR 83 | Temp 97.4°F | Resp 14 | Ht 67.0 in | Wt 226.4 lb

## 2011-12-26 DIAGNOSIS — Z09 Encounter for follow-up examination after completed treatment for conditions other than malignant neoplasm: Secondary | ICD-10-CM

## 2011-12-26 NOTE — Progress Notes (Signed)
Subjective:     Patient ID: [name], female   DOB: 1980-11-29, 31 y.o.   MRN: [id number]  HPI She is here for her first postoperative visit status post laparoscopic cholecystectomy. Again this was performed at 25 weeks of pregnancy. She is doing very well has no complaints. She is eating well and moving her bowels well. The pregnancy remains viable and is doing well  Review of Systems     Objective:   Physical Exam Her incisions are well-healed. The final pathology showed acute and chronic cholecystitis with gallstones    Assessment:     Patient doing well status post laparoscopic cholecystectomy    Plan:     She may return to normal activity. I will see her back as needed

## 2012-03-14 ENCOUNTER — Encounter (HOSPITAL_COMMUNITY): Payer: Self-pay | Admitting: *Deleted

## 2012-03-14 ENCOUNTER — Inpatient Hospital Stay (HOSPITAL_COMMUNITY)
Admission: AD | Admit: 2012-03-14 | Discharge: 2012-03-17 | DRG: 370 | Disposition: A | Discharge status: Home / Self Care | Payer: BC Managed Care – PPO | Source: Ambulatory Visit | Attending: Obstetrics and Gynecology | Admitting: Obstetrics and Gynecology

## 2012-03-14 DIAGNOSIS — O9903 Anemia complicating the puerperium: Secondary | ICD-10-CM | POA: Diagnosis not present

## 2012-03-14 DIAGNOSIS — B001 Herpesviral vesicular dermatitis: Secondary | ICD-10-CM

## 2012-03-14 DIAGNOSIS — O324XX0 Maternal care for high head at term, not applicable or unspecified: Secondary | ICD-10-CM | POA: Diagnosis present

## 2012-03-14 DIAGNOSIS — O409XX0 Polyhydramnios, unspecified trimester, not applicable or unspecified: Principal | ICD-10-CM | POA: Diagnosis present

## 2012-03-14 DIAGNOSIS — D62 Acute posthemorrhagic anemia: Secondary | ICD-10-CM | POA: Diagnosis not present

## 2012-03-14 DIAGNOSIS — O3660X0 Maternal care for excessive fetal growth, unspecified trimester, not applicable or unspecified: Secondary | ICD-10-CM | POA: Diagnosis present

## 2012-03-14 DIAGNOSIS — K8020 Calculus of gallbladder without cholecystitis without obstruction: Secondary | ICD-10-CM

## 2012-03-14 LAB — CBC
HCT: 35.5 % — ABNORMAL LOW (ref 36.0–46.0)
Hemoglobin: 11.9 g/dL — ABNORMAL LOW (ref 12.0–15.0)
MCV: 91.5 fL (ref 78.0–100.0)
RBC: 3.88 MIL/uL (ref 3.87–5.11)
WBC: 16.7 10*3/uL — ABNORMAL HIGH (ref 4.0–10.5)

## 2012-03-14 LAB — PREPARE RBC (CROSSMATCH)

## 2012-03-14 MED ORDER — LIDOCAINE HCL (PF) 1 % IJ SOLN
30.0000 mL | INTRAMUSCULAR | Status: DC | PRN
  Filled 2012-03-14: qty 30

## 2012-03-14 MED ORDER — LACTATED RINGERS IV SOLN
INTRAVENOUS | Status: DC
  Administered 2012-03-14 – 2012-03-15 (×3): via INTRAVENOUS

## 2012-03-14 MED ORDER — OXYTOCIN BOLUS FROM INFUSION
500.0000 mL | INTRAVENOUS | Status: DC
  Filled 2012-03-14 (×88): qty 500

## 2012-03-14 MED ORDER — TERBUTALINE SULFATE 1 MG/ML IJ SOLN: 0.2500 mg | Freq: Once | INTRAMUSCULAR | Status: AC | PRN

## 2012-03-14 MED ORDER — MISOPROSTOL 25 MCG QUARTER TABLET
25.0000 ug | ORAL_TABLET | ORAL | Status: DC | PRN
  Administered 2012-03-14 – 2012-03-15 (×2): 25 ug via VAGINAL
  Filled 2012-03-14 (×2): qty 0.25

## 2012-03-14 MED ORDER — ONDANSETRON HCL 4 MG/2ML IJ SOLN: 4.0000 mg | Freq: Four times a day (QID) | INTRAMUSCULAR | Status: DC | PRN

## 2012-03-14 MED ORDER — OXYTOCIN 40 UNITS IN LACTATED RINGERS INFUSION - SIMPLE MED
1.0000 m[IU]/min | INTRAVENOUS | Status: DC
  Administered 2012-03-15: 2 m[IU]/min via INTRAVENOUS
  Filled 2012-03-14: qty 1000

## 2012-03-14 MED ORDER — CITRIC ACID-SODIUM CITRATE 334-500 MG/5ML PO SOLN
30.0000 mL | ORAL | Status: DC | PRN
  Administered 2012-03-15: 30 mL via ORAL
  Filled 2012-03-14: qty 15

## 2012-03-14 MED ORDER — OXYTOCIN 40 UNITS IN LACTATED RINGERS INFUSION - SIMPLE MED: 62.5000 mL/h | INTRAVENOUS | Status: DC

## 2012-03-14 MED ORDER — ACETAMINOPHEN 325 MG PO TABS: 650.0000 mg | ORAL_TABLET | ORAL | Status: DC | PRN

## 2012-03-14 MED ORDER — IBUPROFEN 600 MG PO TABS: 600.0000 mg | ORAL_TABLET | Freq: Four times a day (QID) | ORAL | Status: DC | PRN

## 2012-03-14 MED ORDER — ESCITALOPRAM OXALATE 5 MG PO TABS
15.0000 mg | ORAL_TABLET | Freq: Every day | ORAL | Status: DC
  Administered 2012-03-14: 15 mg via ORAL
  Filled 2012-03-14 (×2): qty 1

## 2012-03-14 MED ORDER — FLEET ENEMA 7-19 GM/118ML RE ENEM: 1.0000 | ENEMA | Freq: Once | RECTAL | Status: DC

## 2012-03-14 MED ORDER — LACTATED RINGERS IV SOLN: 500.0000 mL | INTRAVENOUS | Status: DC | PRN

## 2012-03-14 MED ORDER — OXYCODONE-ACETAMINOPHEN 5-325 MG PO TABS: 1.0000 | ORAL_TABLET | ORAL | Status: DC | PRN

## 2012-03-14 MED ORDER — ZOLPIDEM TARTRATE 5 MG PO TABS: 5.0000 mg | ORAL_TABLET | Freq: Every evening | ORAL | Status: DC | PRN

## 2012-03-14 NOTE — Progress Notes (Signed)
[name] is a 31 y.o. G1P0 at [redacted]w[redacted]d by LMP admitted for induction of labor due to Hydramnios.  Subjective: Comfortable  Objective: BP 139/86  Pulse 99  Temp 98.1 F (36.7 C) (Oral)  Resp 18  Ht 5\' 5"  (1.651 m)  Wt 108.41 kg (239 lb)  BMI 39.77 kg/m2  LMP 05/28/2011      FHT:  FHR: 155 bpm, variability: moderate,  accelerations:  Present,  decelerations:  Absent UC:   irregular, every 5-8 minutes SVE:   Dilation: 2 Effacement (%): 60 Station: -2 Exam by:: C. Blackstock, RN Cytotec placed  Labs: CBC pending  Assessment / Plan: 40 weeks Moderate Polyhydramnios  Labor: Progressing normally Preeclampsia:  na Fetal Wellbeing:  Category I Pain Control:  Labor support without medications I/D:  n/a Anticipated MOD:  High risk for cesarean delivery  Cassity Christian J 03/14/2012, 8:35 PM

## 2012-03-15 ENCOUNTER — Encounter (HOSPITAL_COMMUNITY): Payer: Self-pay | Admitting: Anesthesiology

## 2012-03-15 ENCOUNTER — Encounter (HOSPITAL_COMMUNITY)
Admission: AD | Disposition: A | Discharge status: Home / Self Care | Payer: Self-pay | Source: Ambulatory Visit | Attending: Obstetrics and Gynecology

## 2012-03-15 ENCOUNTER — Inpatient Hospital Stay (HOSPITAL_COMMUNITY): Payer: BC Managed Care – PPO | Admitting: Anesthesiology

## 2012-03-15 SURGERY — Surgical Case
Anesthesia: Regional | Site: Abdomen | Wound class: Clean Contaminated

## 2012-03-15 MED ORDER — PROMETHAZINE HCL 25 MG/ML IJ SOLN: 6.2500 mg | INTRAMUSCULAR | Status: DC | PRN

## 2012-03-15 MED ORDER — SCOPOLAMINE 1 MG/3DAYS TD PT72
MEDICATED_PATCH | TRANSDERMAL | Status: AC
  Administered 2012-03-15: 1.5 mg via TRANSDERMAL
  Filled 2012-03-15: qty 1

## 2012-03-15 MED ORDER — DIPHENHYDRAMINE HCL 50 MG/ML IJ SOLN: 25.0000 mg | INTRAMUSCULAR | Status: DC | PRN

## 2012-03-15 MED ORDER — SODIUM CHLORIDE 0.9 % IV SOLN: 1.0000 ug/kg/h | INTRAVENOUS | Status: DC | PRN

## 2012-03-15 MED ORDER — SCOPOLAMINE 1 MG/3DAYS TD PT72
1.0000 | MEDICATED_PATCH | Freq: Once | TRANSDERMAL | Status: DC
  Administered 2012-03-15: 1.5 mg via TRANSDERMAL

## 2012-03-15 MED ORDER — KETOROLAC TROMETHAMINE 30 MG/ML IJ SOLN
INTRAMUSCULAR | Status: AC
  Filled 2012-03-15: qty 1

## 2012-03-15 MED ORDER — EPHEDRINE 5 MG/ML INJ
10.0000 mg | INTRAVENOUS | Status: DC | PRN
  Filled 2012-03-15: qty 4

## 2012-03-15 MED ORDER — OXYTOCIN 10 UNIT/ML IJ SOLN
40.0000 [IU] | INTRAVENOUS | Status: DC | PRN
  Administered 2012-03-15: 40 [IU] via INTRAVENOUS

## 2012-03-15 MED ORDER — BUPIVACAINE HCL (PF) 0.25 % IJ SOLN
INTRAMUSCULAR | Status: DC | PRN
  Administered 2012-03-15: 6 mL
  Administered 2012-03-15 (×2): 5 mL

## 2012-03-15 MED ORDER — PHENYLEPHRINE HCL 10 MG/ML IJ SOLN
INTRAMUSCULAR | Status: DC | PRN
  Administered 2012-03-15 (×2): 80 ug via INTRAVENOUS
  Administered 2012-03-15: 120 ug via INTRAVENOUS
  Administered 2012-03-15: 80 ug via INTRAVENOUS
  Administered 2012-03-15: 120 ug via INTRAVENOUS
  Administered 2012-03-15: 80 ug via INTRAVENOUS
  Administered 2012-03-15 (×2): 120 ug via INTRAVENOUS
  Administered 2012-03-15: 80 ug via INTRAVENOUS

## 2012-03-15 MED ORDER — DIPHENHYDRAMINE HCL 25 MG PO CAPS: 25.0000 mg | ORAL_CAPSULE | ORAL | Status: DC | PRN

## 2012-03-15 MED ORDER — DIPHENHYDRAMINE HCL 25 MG PO CAPS: 25.0000 mg | ORAL_CAPSULE | Freq: Four times a day (QID) | ORAL | Status: DC | PRN

## 2012-03-15 MED ORDER — LACTATED RINGERS IV SOLN
INTRAVENOUS | Status: DC | PRN
  Administered 2012-03-15 (×3): via INTRAVENOUS

## 2012-03-15 MED ORDER — ONDANSETRON HCL 4 MG/2ML IJ SOLN: 4.0000 mg | Freq: Three times a day (TID) | INTRAMUSCULAR | Status: DC | PRN

## 2012-03-15 MED ORDER — DIPHENHYDRAMINE HCL 50 MG/ML IJ SOLN: 12.5000 mg | INTRAMUSCULAR | Status: DC | PRN

## 2012-03-15 MED ORDER — CEFAZOLIN SODIUM-DEXTROSE 2-3 GM-% IV SOLR
INTRAVENOUS | Status: AC
  Filled 2012-03-15: qty 50

## 2012-03-15 MED ORDER — EPHEDRINE 5 MG/ML INJ: 10.0000 mg | INTRAVENOUS | Status: DC | PRN

## 2012-03-15 MED ORDER — PHENYLEPHRINE 40 MCG/ML (10ML) SYRINGE FOR IV PUSH (FOR BLOOD PRESSURE SUPPORT)
PREFILLED_SYRINGE | INTRAVENOUS | Status: AC
  Filled 2012-03-15: qty 10

## 2012-03-15 MED ORDER — OXYCODONE-ACETAMINOPHEN 5-325 MG PO TABS
1.0000 | ORAL_TABLET | ORAL | Status: DC | PRN
  Administered 2012-03-16 (×2): 1 via ORAL
  Filled 2012-03-15 (×2): qty 1

## 2012-03-15 MED ORDER — OXYTOCIN 10 UNIT/ML IJ SOLN
INTRAMUSCULAR | Status: AC
  Filled 2012-03-15: qty 4

## 2012-03-15 MED ORDER — OXYTOCIN 40 UNITS IN LACTATED RINGERS INFUSION - SIMPLE MED: 62.5000 mL/h | INTRAVENOUS | Status: DC

## 2012-03-15 MED ORDER — PHENYLEPHRINE 40 MCG/ML (10ML) SYRINGE FOR IV PUSH (FOR BLOOD PRESSURE SUPPORT): 80.0000 ug | PREFILLED_SYRINGE | INTRAVENOUS | Status: DC | PRN

## 2012-03-15 MED ORDER — METOCLOPRAMIDE HCL 5 MG/ML IJ SOLN: 10.0000 mg | Freq: Three times a day (TID) | INTRAMUSCULAR | Status: DC | PRN

## 2012-03-15 MED ORDER — ACETAMINOPHEN 10 MG/ML IV SOLN: 1000.0000 mg | Freq: Four times a day (QID) | INTRAVENOUS | Status: DC | PRN

## 2012-03-15 MED ORDER — SODIUM BICARBONATE 8.4 % IV SOLN
INTRAVENOUS | Status: DC | PRN
  Administered 2012-03-15: 5 mL via EPIDURAL
  Administered 2012-03-15: 10 mL via EPIDURAL

## 2012-03-15 MED ORDER — BUPIVACAINE HCL (PF) 0.25 % IJ SOLN
INTRAMUSCULAR | Status: AC
  Filled 2012-03-15: qty 30

## 2012-03-15 MED ORDER — FENTANYL CITRATE 0.05 MG/ML IJ SOLN
INTRAMUSCULAR | Status: AC
  Filled 2012-03-15: qty 2

## 2012-03-15 MED ORDER — CEFOTETAN DISODIUM 2 G IJ SOLR: 2.0000 g | INTRAMUSCULAR | Status: DC

## 2012-03-15 MED ORDER — LACTATED RINGERS IV SOLN
INTRAVENOUS | Status: DC
  Administered 2012-03-15 – 2012-03-16 (×2): via INTRAVENOUS

## 2012-03-15 MED ORDER — NALBUPHINE HCL 10 MG/ML IJ SOLN: 5.0000 mg | INTRAMUSCULAR | Status: DC | PRN

## 2012-03-15 MED ORDER — SODIUM CHLORIDE 0.9 % IJ SOLN: 3.0000 mL | INTRAMUSCULAR | Status: DC | PRN

## 2012-03-15 MED ORDER — LACTATED RINGERS IV SOLN
500.0000 mL | Freq: Once | INTRAVENOUS | Status: AC
  Administered 2012-03-15: 500 mL via INTRAVENOUS

## 2012-03-15 MED ORDER — ZOLPIDEM TARTRATE 5 MG PO TABS: 5.0000 mg | ORAL_TABLET | Freq: Every evening | ORAL | Status: DC | PRN

## 2012-03-15 MED ORDER — PHENYLEPHRINE 40 MCG/ML (10ML) SYRINGE FOR IV PUSH (FOR BLOOD PRESSURE SUPPORT)
PREFILLED_SYRINGE | INTRAVENOUS | Status: AC
  Filled 2012-03-15: qty 5

## 2012-03-15 MED ORDER — DIBUCAINE 1 % RE OINT: 1.0000 "application " | TOPICAL_OINTMENT | RECTAL | Status: DC | PRN

## 2012-03-15 MED ORDER — BUPIVACAINE HCL (PF) 0.25 % IJ SOLN
INTRAMUSCULAR | Status: DC | PRN
  Administered 2012-03-15: 10 mL

## 2012-03-15 MED ORDER — SODIUM CHLORIDE 0.9 % IR SOLN
Status: DC | PRN
  Administered 2012-03-15: 1000 mL

## 2012-03-15 MED ORDER — TETANUS-DIPHTH-ACELL PERTUSSIS 5-2.5-18.5 LF-MCG/0.5 IM SUSP
0.5000 mL | Freq: Once | INTRAMUSCULAR | Status: AC
  Administered 2012-03-16: 0.5 mL via INTRAMUSCULAR
  Filled 2012-03-15: qty 0.5

## 2012-03-15 MED ORDER — SIMETHICONE 80 MG PO CHEW: 80.0000 mg | CHEWABLE_TABLET | ORAL | Status: DC | PRN

## 2012-03-15 MED ORDER — MIDAZOLAM HCL 2 MG/2ML IJ SOLN: 0.5000 mg | Freq: Once | INTRAMUSCULAR | Status: DC | PRN

## 2012-03-15 MED ORDER — SENNOSIDES-DOCUSATE SODIUM 8.6-50 MG PO TABS
2.0000 | ORAL_TABLET | Freq: Every day | ORAL | Status: DC
  Administered 2012-03-15 – 2012-03-16 (×2): 2 via ORAL

## 2012-03-15 MED ORDER — ONDANSETRON HCL 4 MG PO TABS: 4.0000 mg | ORAL_TABLET | ORAL | Status: DC | PRN

## 2012-03-15 MED ORDER — MEPERIDINE HCL 25 MG/ML IJ SOLN: 6.2500 mg | INTRAMUSCULAR | Status: DC | PRN

## 2012-03-15 MED ORDER — NALOXONE HCL 0.4 MG/ML IJ SOLN: 0.4000 mg | INTRAMUSCULAR | Status: DC | PRN

## 2012-03-15 MED ORDER — KETOROLAC TROMETHAMINE 30 MG/ML IJ SOLN: 30.0000 mg | Freq: Four times a day (QID) | INTRAMUSCULAR | Status: DC | PRN

## 2012-03-15 MED ORDER — KETOROLAC TROMETHAMINE 30 MG/ML IJ SOLN
30.0000 mg | Freq: Four times a day (QID) | INTRAMUSCULAR | Status: DC | PRN
  Administered 2012-03-15: 30 mg via INTRAVENOUS
  Filled 2012-03-15: qty 1

## 2012-03-15 MED ORDER — IBUPROFEN 600 MG PO TABS
600.0000 mg | ORAL_TABLET | Freq: Four times a day (QID) | ORAL | Status: DC
  Administered 2012-03-16 – 2012-03-17 (×5): 600 mg via ORAL
  Filled 2012-03-15 (×5): qty 1

## 2012-03-15 MED ORDER — SIMETHICONE 80 MG PO CHEW
80.0000 mg | CHEWABLE_TABLET | Freq: Three times a day (TID) | ORAL | Status: DC
  Administered 2012-03-15 – 2012-03-17 (×6): 80 mg via ORAL

## 2012-03-15 MED ORDER — MENTHOL 3 MG MT LOZG: 1.0000 | LOZENGE | OROMUCOSAL | Status: DC | PRN

## 2012-03-15 MED ORDER — PRENATAL MULTIVITAMIN CH
1.0000 | ORAL_TABLET | Freq: Every day | ORAL | Status: DC
  Administered 2012-03-16: 1 via ORAL
  Filled 2012-03-15: qty 1

## 2012-03-15 MED ORDER — METHYLERGONOVINE MALEATE 0.2 MG/ML IJ SOLN: 0.2000 mg | INTRAMUSCULAR | Status: DC | PRN

## 2012-03-15 MED ORDER — ONDANSETRON HCL 4 MG/2ML IJ SOLN
INTRAMUSCULAR | Status: DC | PRN
  Administered 2012-03-15: 4 mg via INTRAVENOUS

## 2012-03-15 MED ORDER — LANOLIN HYDROUS EX OINT: 1.0000 "application " | TOPICAL_OINTMENT | CUTANEOUS | Status: DC | PRN

## 2012-03-15 MED ORDER — FENTANYL 2.5 MCG/ML BUPIVACAINE 1/10 % EPIDURAL INFUSION (WH - ANES)
14.0000 mL/h | INTRAMUSCULAR | Status: DC
Start: 2012-03-15 — End: 2012-03-15
  Administered 2012-03-15: 14 mL/h via EPIDURAL
  Filled 2012-03-15 (×2): qty 125

## 2012-03-15 MED ORDER — WITCH HAZEL-GLYCERIN EX PADS: 1.0000 "application " | MEDICATED_PAD | CUTANEOUS | Status: DC | PRN

## 2012-03-15 MED ORDER — ONDANSETRON HCL 4 MG/2ML IJ SOLN: 4.0000 mg | INTRAMUSCULAR | Status: DC | PRN

## 2012-03-15 MED ORDER — FENTANYL CITRATE 0.05 MG/ML IJ SOLN: 25.0000 ug | INTRAMUSCULAR | Status: DC | PRN

## 2012-03-15 MED ORDER — ONDANSETRON HCL 4 MG/2ML IJ SOLN
INTRAMUSCULAR | Status: AC
  Filled 2012-03-15: qty 2

## 2012-03-15 MED ORDER — CEFAZOLIN SODIUM-DEXTROSE 2-3 GM-% IV SOLR
INTRAVENOUS | Status: DC | PRN
  Administered 2012-03-15: 2 g via INTRAVENOUS

## 2012-03-15 MED ORDER — MORPHINE SULFATE 0.5 MG/ML IJ SOLN
INTRAMUSCULAR | Status: AC
  Filled 2012-03-15: qty 10

## 2012-03-15 MED ORDER — MORPHINE SULFATE (PF) 0.5 MG/ML IJ SOLN
INTRAMUSCULAR | Status: DC | PRN
  Administered 2012-03-15: 1 mg via INTRAVENOUS

## 2012-03-15 MED ORDER — MORPHINE SULFATE (PF) 0.5 MG/ML IJ SOLN
INTRAMUSCULAR | Status: DC | PRN
  Administered 2012-03-15: 4 mg via EPIDURAL

## 2012-03-15 MED ORDER — PHENYLEPHRINE 40 MCG/ML (10ML) SYRINGE FOR IV PUSH (FOR BLOOD PRESSURE SUPPORT)
80.0000 ug | PREFILLED_SYRINGE | INTRAVENOUS | Status: DC | PRN
  Filled 2012-03-15: qty 5

## 2012-03-15 MED ORDER — METHYLERGONOVINE MALEATE 0.2 MG PO TABS: 0.2000 mg | ORAL_TABLET | ORAL | Status: DC | PRN

## 2012-03-15 SURGICAL SUPPLY — 34 items
CLOTH BEACON ORANGE TIMEOUT ST (SAFETY) ×2 IMPLANT
CONTAINER PREFILL 10% NBF 15ML (MISCELLANEOUS) IMPLANT
DRAPE SURG 17X23 STRL (DRAPES) ×2 IMPLANT
DRESSING TELFA 8X3 (GAUZE/BANDAGES/DRESSINGS) IMPLANT
DRSG COVADERM 4X10 (GAUZE/BANDAGES/DRESSINGS) ×1 IMPLANT
DURAPREP 26ML APPLICATOR (WOUND CARE) ×2 IMPLANT
ELECT REM PT RETURN 9FT ADLT (ELECTROSURGICAL) ×2
ELECTRODE REM PT RTRN 9FT ADLT (ELECTROSURGICAL) ×1 IMPLANT
EXTRACTOR VACUUM M CUP 4 TUBE (SUCTIONS) IMPLANT
GAUZE SPONGE 4X4 12PLY STRL LF (GAUZE/BANDAGES/DRESSINGS) ×2 IMPLANT
GLOVE BIO SURGEON STRL SZ7.5 (GLOVE) ×4 IMPLANT
GOWN PREVENTION PLUS LG XLONG (DISPOSABLE) ×4 IMPLANT
GOWN PREVENTION PLUS XLARGE (GOWN DISPOSABLE) ×2 IMPLANT
KIT ABG SYR 3ML LUER SLIP (SYRINGE) IMPLANT
NDL HYPO 25X1 1.5 SAFETY (NEEDLE) ×1 IMPLANT
NEEDLE HYPO 25X1 1.5 SAFETY (NEEDLE) ×2 IMPLANT
NEEDLE HYPO 25X5/8 SAFETYGLIDE (NEEDLE) IMPLANT
NS IRRIG 1000ML POUR BTL (IV SOLUTION) ×2 IMPLANT
PACK C SECTION WH (CUSTOM PROCEDURE TRAY) ×2 IMPLANT
PAD ABD 7.5X8 STRL (GAUZE/BANDAGES/DRESSINGS) ×1 IMPLANT
PAD OB MATERNITY 4.3X12.25 (PERSONAL CARE ITEMS) IMPLANT
SLEEVE SCD COMPRESS KNEE MED (MISCELLANEOUS) ×1 IMPLANT
STAPLER VISISTAT 35W (STAPLE) ×2 IMPLANT
SUT MNCRL 0 VIOLET CTX 36 (SUTURE) ×2 IMPLANT
SUT MON AB 2-0 CT1 27 (SUTURE) ×2 IMPLANT
SUT MON AB-0 CT1 36 (SUTURE) ×3 IMPLANT
SUT MONOCRYL 0 CTX 36 (SUTURE) ×3
SUT PLAIN 0 NONE (SUTURE) IMPLANT
SUT PLAIN 2 0 XLH (SUTURE) IMPLANT
SYR CONTROL 10ML LL (SYRINGE) ×2 IMPLANT
TAPE CLOTH SURG 4X10 WHT LF (GAUZE/BANDAGES/DRESSINGS) ×1 IMPLANT
TOWEL OR 17X24 6PK STRL BLUE (TOWEL DISPOSABLE) ×4 IMPLANT
TRAY FOLEY CATH 14FR (SET/KITS/TRAYS/PACK) ×1 IMPLANT
WATER STERILE IRR 1000ML POUR (IV SOLUTION) ×2 IMPLANT

## 2012-03-15 NOTE — Progress Notes (Signed)
Pt walking around in hallway; informed that she had to be monitored for one more hour continuously before monitors could be removed.

## 2012-03-15 NOTE — Progress Notes (Signed)
[name] is a 31 y.o. G1P0000 at [redacted]w[redacted]d by LMP admitted for induction of labor due to Hydramnios and LGA.  Subjective: Feels pressure Pushing well  Objective: BP 112/93  Pulse 157  Temp 98.3 F (36.8 C) (Oral)  Resp 20  Ht 5\' 5"  (1.651 m)  Wt 108.41 kg (239 lb)  BMI 39.77 kg/m2  SpO2 99%  LMP 05/28/2011   Total I/O In: -  Out: 450 [Urine:450]  FHT:  FHR: 150 bpm, variability: moderate,  accelerations:  Present,  decelerations:  Present variables with occ late return UC:   regular, every 2 minutes SVE:   Dilation: 10 Effacement (%): 100 Station: -1;0 Exam by:: B.Smith RN No change in descent, asynclitic.  Labs: Lab Results  Component Value Date   WBC 16.7* 03/14/2012   HGB 11.9* 03/14/2012   HCT 35.5* 03/14/2012   MCV 91.5 03/14/2012   PLT 349 03/14/2012    Assessment / Plan: Polyhydramnios and LGA Failure to descend  Labor: Failed descent Preeclampsia:  na Fetal Wellbeing:  Category I and Category II Pain Control:  Epidural I/D:  n/a Anticipated MOD:  Proceed with csec. Consent done. Risks and benefits discussed.   Gregory Dowe J 03/15/2012, 5:12 PM

## 2012-03-15 NOTE — Op Note (Signed)
Cesarean Section Procedure Note  Indications: failure to progress: arrest of descent  Pre-operative Diagnosis: 40 week 0 day pregnancy.  Post-operative Diagnosis: same  Surgeon: [name]   Assistants: Fredric Mare, CNM  Anesthesia: Epidural anesthesia and Local anesthesia 0.25.% bupivacaine  ASA Class: 2  Procedure Details  The patient was seen in the Holding Room. The risks, benefits, complications, treatment options, and expected outcomes were discussed with the patient.  The patient concurred with the proposed plan, giving informed consent. The risks of anesthesia, infection, bleeding and possible injury to other organs discussed. Injury to bowel, bladder, or ureter with possible need for repair discussed. Possible need for transfusion with secondary risks of hepatitis or HIV acquisition discussed. Post operative complications to include but not limited to DVT, PE and Pneumonia noted. The site of surgery properly noted/marked. The patient was taken to Operating Room # 1, identified as [name] and the procedure verified as C-Section Delivery. A Time Out was held and the above information confirmed.  After induction of anesthesia, the patient was draped and prepped in the usual sterile manner. A Pfannenstiel incision was made and carried down through the subcutaneous tissue to the fascia. Fascial incision was made and extended transversely using Mayo scissors. The fascia was separated from the underlying rectus tissue superiorly and inferiorly. The peritoneum was identified and entered. Peritoneal incision was extended longitudinally. The utero-vesical peritoneal reflection was incised transversely and the bladder flap was bluntly freed from the lower uterine segment. A low transverse uterine incision(Kerr hysterotomy) was made. Delivered from ROP presentation was a  female with Apgar scores of 4 at one minute and 7 at five minutes. Cord ph collected.Bulb suctioning gently performed.  Neonatal team in attendance.After the umbilical cord was clamped and cut cord blood was obtained for evaluation. The placenta was removed intact and appeared normal. The uterus was curetted with a dry lap pack. Good hemostasis was noted.The uterine outline, tubes and ovaries appeared normal. The uterine incision was closed with running locked sutures of 0 Monocryl x 2 layers. Hemostasis was observed. Lavage was carried out until clear.The parietal peritoneum was closed with a running 2-0 Monocryl suture. The fascia was then reapproximated with running sutures of 0 Monocryl. [place] tissue reapproximated with 2-0 plain. The skin was reapproximated with staples.  Instrument, sponge, and needle counts were correct prior the abdominal closure and at the conclusion of the case.   Findings: Terminal meconium  Estimated Blood Loss:  300 mL         Drains: foley                 Specimens: placenta                 Complications:  None; patient tolerated the procedure well.         Disposition: PACU - hemodynamically stable.         Condition: stable  Attending Attestation: I performed the procedure.

## 2012-03-15 NOTE — Progress Notes (Signed)
[name] is a 31 y.o. G1P0000 at [redacted]w[redacted]d by LMP admitted for induction of labor due to Hydramnios.  Subjective: Feels pressure Contractions painful  Objective: BP 125/89  Pulse 124  Temp 98.7 F (37.1 C) (Oral)  Resp 18  Ht 5\' 5"  (1.651 m)  Wt 108.41 kg (239 lb)  BMI 39.77 kg/m2  SpO2 99%  LMP 05/28/2011   Total I/O In: -  Out: 450 [Urine:450]  FHT:  FHR: 140 bpm, variability: moderate,  accelerations:  Present,  decelerations:  Absent UC:   regular, every 2 minutes SVE:   Dilation: 8.5 Effacement (%): 80 Station: -1 Exam by:: Tex Conroy 200-250 MVU by IUPC  Labs: Lab Results  Component Value Date   WBC 16.7* 03/14/2012   HGB 11.9* 03/14/2012   HCT 35.5* 03/14/2012   MCV 91.5 03/14/2012   PLT 349 03/14/2012    Assessment / Plan: Protracted active phase LGA with polyhydramnios  Labor: Slowed progress in active phase Preeclampsia:  na Fetal Wellbeing:  Category I Pain Control:  Epidural I/D:  n/a Anticipated MOD:  Cautious approach to VD.  Tahji Bowdle J 03/15/2012, 2:24 PM

## 2012-03-15 NOTE — Anesthesia Preprocedure Evaluation (Signed)
Anesthesia Evaluation  Patient identified by MRN, date of birth, ID band Patient awake    Reviewed: Allergy & Precautions, H&P , NPO status , Patient's Chart, lab work & pertinent test results, reviewed documented beta blocker date and time   History of Anesthesia Complications Negative for: history of anesthetic complications  Airway Mallampati: III TM Distance: >3 FB Neck ROM: full    Dental  (+) Teeth Intact   Pulmonary neg pulmonary ROS,  breath sounds clear to auscultation        Cardiovascular negative cardio ROS  Rhythm:regular Rate:Normal     Neuro/Psych PSYCHIATRIC DISORDERS (anxiety) negative neurological ROS     GI/Hepatic negative GI ROS, Neg liver ROS,   Endo/Other  Morbid obesity  Renal/GU negative Renal ROS     Musculoskeletal   Abdominal   Peds  Hematology negative hematology ROS (+)   Anesthesia Other Findings   Reproductive/Obstetrics (+) Pregnancy                           Anesthesia Physical Anesthesia Plan  ASA: III  Anesthesia Plan: Epidural   Post-op Pain Management:    Induction:   Airway Management Planned:   Additional Equipment:   Intra-op Plan:   Post-operative Plan:   Informed Consent: I have reviewed the patients History and Physical, chart, labs and discussed the procedure including the risks, benefits and alternatives for the proposed anesthesia with the patient or authorized representative who has indicated his/her understanding and acceptance.     Plan Discussed with:   Anesthesia Plan Comments:         Anesthesia Quick Evaluation

## 2012-03-15 NOTE — Transfer of Care (Signed)
Immediate Anesthesia Transfer of Care Note  Patient: [name]  Procedure(s) Performed: Procedure(s) (LRB) with comments: CESAREAN SECTION (N/A) - Primary cesarean section with delivery of baby girl at 57. Apgars 4/7.  Patient Location: PACU  Anesthesia Type: Epidural  Level of Consciousness: awake, alert  and oriented  Airway & Oxygen Therapy: Patient Spontanous Breathing  Post-op Assessment: Report given to PACU RN and Post -op Vital signs reviewed and stable  Post vital signs: stable  Complications: No apparent anesthesia complications

## 2012-03-15 NOTE — OR Nursing (Addendum)
Uterus massaged by S. Daltyn Degroat Charity fundraiser. Two tubes of cord blood sent to lab. Foley catheter in upon arrival to OR. Urine color-bloody.  10cc of blood evacuated from uterus during uterine massage.

## 2012-03-15 NOTE — Anesthesia Postprocedure Evaluation (Signed)
Anesthesia Post Note  Patient: [name]  Procedure(s) Performed: Procedure(s) (LRB): CESAREAN SECTION (N/A)  Anesthesia type: Epidural  Patient location: PACU  Post pain: Pain level controlled  Post assessment: Post-op Vital signs reviewed  Last Vitals:  Filed Vitals:   03/15/12 2000  BP: 96/50  Pulse: 99  Temp:   Resp: 16    Post vital signs: Reviewed  Level of consciousness: awake  Complications: No apparent anesthesia complications

## 2012-03-15 NOTE — H&P (Signed)
[name], [name]              ACCOUNT NO.:  [id number]  MEDICAL RECORD NO.:  [id number]  LOCATION:  9173                          FACILITY:  [facility]  PHYSICIAN:  [name], M.D.DATE OF BIRTH:  September 09, 1980  DATE OF ADMISSION:  03/14/2012 DATE OF DISCHARGE:                             HISTORY & PHYSICAL   CHIEF COMPLAINT:  Moderate polyhydramnios at 40 weeks for cervical ripening and induction.  HISTORY OF PRESENT ILLNESS:  She is a 31 year old white female, G1, P0, with moderate polyhydramnios and presumed LGA for induction.  ALLERGIES:  She has no known drug allergies.  MEDICATIONS:  Prenatal vitamins.  SOCIAL HISTORY:  She is a nonsmoker, nondrinker.  She denies domestic physical violence.  MEDICATIONS:  Zofran as needed, Lexapro, and prenatal vitamins.  FAMILY HISTORY:  She has a family history of diabetes, congenital heart disease, and bone cancer.  This is her first pregnancy.  She has a history of wisdom tooth removal and second trimester cholecystectomy.  PHYSICAL EXAMINATION:  GENERAL:  She is a well-developed, well- nourished, white female, in no acute distress. HEENT:  Normal. NECK:  Supple.  Full range of motion. LUNGS:  Clear. HEART:  Regular rhythm. ABDOMEN:  Soft, gravid, nontender.  Estimated fetal weight 9-1/2 pounds. Cervix is 2 cm, 50%, vertex, -2. EXTREMITIES:  There are no cords. NEUROLOGIC:  Nonfocal. SKIN:  Intact.  IMPRESSION:  A 40-week intrauterine pregnancy with moderate polyhydramnios and LGA for cervical ripening induction.  PLAN:  Proceed with induction.  Anticipate cautious attempts at labor and vaginal delivery, epidural as needed.  Pitocin in a.m.     [name], M.D.     RJT/MEDQ  D:  03/14/2012  T:  03/15/2012  Job:  [id number]

## 2012-03-15 NOTE — Progress Notes (Signed)
[name] is a 31 y.o. G1P0000 at [redacted]w[redacted]d by LMP admitted for induction of labor due to Hydramnios.  Subjective: Occ pressure  Objective: BP 121/77  Pulse 99  Temp 98.6 F (37 C) (Oral)  Resp 18  Ht 5\' 5"  (1.651 m)  Wt 108.41 kg (239 lb)  BMI 39.77 kg/m2  SpO2 99%  LMP 05/28/2011   Total I/O In: -  Out: 450 [Urine:450]  FHT:  FHR: 155 bpm, variability: moderate,  accelerations:  Present,  decelerations:  Absent UC:   irregular, every 3-5 minutes SVE:   6/100/0 IUPC placed without difficulty  Labs: Lab Results  Component Value Date   WBC 16.7* 03/14/2012   HGB 11.9* 03/14/2012   HCT 35.5* 03/14/2012   MCV 91.5 03/14/2012   PLT 349 03/14/2012    Assessment / Plan: Induction of labor due to polyhydramnios and LGA,  progressing well on pitocin  Labor: Progressing normally Preeclampsia:  na Fetal Wellbeing:  Category I Pain Control:  Epidural I/D:  n/a Anticipated MOD:  Cautous approach to SVD, good progress at this time  [name] 03/15/2012, 11:02 AM

## 2012-03-15 NOTE — Anesthesia Procedure Notes (Signed)
Epidural Patient location during procedure: OB Start time: 03/15/2012 5:19 AM  Staffing Performed by: anesthesiologist   Preanesthetic Checklist Completed: patient identified, site marked, surgical consent, pre-op evaluation, timeout performed, IV checked, risks and benefits discussed and monitors and equipment checked  Epidural Patient position: sitting Prep: site prepped and draped and DuraPrep Patient monitoring: continuous pulse ox and blood pressure Approach: midline Injection technique: LOR air  Needle:  Needle type: Tuohy  Needle gauge: 17 G Needle length: 9 cm and 9 Needle insertion depth: 5 cm cm Catheter type: closed end flexible Catheter size: 19 Gauge Catheter at skin depth: 10 cm Test dose: negative  Assessment Events: blood not aspirated, injection not painful, no injection resistance, negative IV test and no paresthesia  Additional Notes Discussed risk of headache, infection, bleeding, nerve injury and failed or incomplete block.  Patient voices understanding and wishes to proceed. Reason for block:procedure for pain

## 2012-03-16 ENCOUNTER — Encounter (HOSPITAL_COMMUNITY): Payer: Self-pay | Admitting: *Deleted

## 2012-03-16 DIAGNOSIS — D62 Acute posthemorrhagic anemia: Secondary | ICD-10-CM | POA: Diagnosis not present

## 2012-03-16 LAB — CBC
HCT: 29.1 % — ABNORMAL LOW (ref 36.0–46.0)
MCV: 91.8 fL (ref 78.0–100.0)
RDW: 14.5 % (ref 11.5–15.5)
WBC: 17.8 10*3/uL — ABNORMAL HIGH (ref 4.0–10.5)

## 2012-03-16 MED ORDER — INFLUENZA VIRUS VACC SPLIT PF IM SUSP
0.5000 mL | INTRAMUSCULAR | Status: AC
  Administered 2012-03-17: 0.5 mL via INTRAMUSCULAR
  Filled 2012-03-16: qty 0.5

## 2012-03-16 MED ORDER — DOCUSATE SODIUM 100 MG PO CAPS
100.0000 mg | ORAL_CAPSULE | Freq: Every day | ORAL | Status: DC
  Administered 2012-03-17: 100 mg via ORAL
  Filled 2012-03-16: qty 1

## 2012-03-16 MED ORDER — POLYSACCHARIDE IRON COMPLEX 150 MG PO CAPS
150.0000 mg | ORAL_CAPSULE | Freq: Every day | ORAL | Status: DC
  Administered 2012-03-17: 150 mg via ORAL
  Filled 2012-03-16: qty 1

## 2012-03-16 NOTE — Anesthesia Postprocedure Evaluation (Signed)
  Anesthesia Post-op Note  Patient: [name]  Procedure(s) Performed: Procedure(s) (LRB) with comments: CESAREAN SECTION (N/A) - Primary cesarean section with delivery of baby girl at 26. Apgars 4/7.  Patient Location: Mother/Baby  Anesthesia Type: Epidural  Level of Consciousness: awake  Airway and Oxygen Therapy: Patient Spontanous Breathing  Post-op Pain: mild  Post-op Assessment: Patient's Cardiovascular Status Stable and Respiratory Function Stable  Post-op Vital Signs: stable  Complications: No apparent anesthesia complications

## 2012-03-16 NOTE — Clinical Social Work Note (Signed)

## 2012-03-16 NOTE — Addendum Note (Signed)
Addendum  created 03/16/12 0844 by Renford Dills, CRNA   Modules edited:Notes Section

## 2012-03-16 NOTE — Progress Notes (Signed)
POSTOPERATIVE DAY # 1 S/P cesarean section   S:         Reports feeling ok today - interested in early discharge             Tolerating po intake / no nausea / no vomiting / no flatus / no BM             Bleeding is light             Pain controlled with motrin and percocet             Not ambulating yet - encouraged OOB / ambulate / void every 3 hours regardless of urge  Newborn breast-feeding  / newborn in room - no nursery or NICU evaluation transfer    O:  VS: BP 111/59  Pulse 79  Temp 98.4 F (36.9 C) (Oral)  Resp 18  Ht 5\' 5"  (1.651 m)  Wt 108.41 kg (239 lb)  BMI 39.77 kg/m2  SpO2 97%  LMP 05/28/2011  Breastfeeding? Unknown   LABS:  Basename 03/16/12 0525 03/14/12 1845  WBC 17.8* 16.7*  HGB 9.7* 11.9*  PLT 224 349                           I&O:  + 205 ml             Physical Exam:             Alert and Oriented X3  Lungs: Clear and unlabored  Heart: regular rate and rhythm / no mumurs  Abdomen: soft, non-tender, non-distended, active BS             Fundus: firm, non-tender, Ueven             Dressing intact - no drainage              Lochia: light  Extremities: trace edema, no calf pain or tenderness, SCD in place  A:        POD # 1 S/P cesarean section            Mild ABL anemia - postoperative  P:        Routine postoperative care - consider early discharge tomorrow              Discussed delivery questions - patient and family asked about initial status of baby at birth "trouble breathing" and passed meconium - wonders if any long-term concerns. Informed of Apgar scores 4 and 7 and cord gas (7.26). Baby had  initial slow transition to air breathing - responded well to initial stimulation to breath and had no residual concerns. Passage of meconium at delivery is not an uncommon occurrence - combination of anesthesia and delivery will cause babies to pass meconium. NO evidence of any complications from slow transition or meconium passage. Reassured.         Marlinda Mike CNM, MSN 03/16/2012, 11:26 AM

## 2012-03-17 ENCOUNTER — Encounter (HOSPITAL_COMMUNITY): Payer: Self-pay | Admitting: Obstetrics and Gynecology

## 2012-03-17 LAB — TYPE AND SCREEN
ABO/RH(D): O POS
Unit division: 0

## 2012-03-17 MED ORDER — IBUPROFEN 600 MG PO TABS: 600.0000 mg | ORAL_TABLET | Freq: Four times a day (QID) | ORAL | Status: DC

## 2012-03-17 MED ORDER — DOCUSATE SODIUM 100 MG PO CAPS: 100.0000 mg | ORAL_CAPSULE | Freq: Three times a day (TID) | ORAL | Status: AC | PRN

## 2012-03-17 MED ORDER — POLYSACCHARIDE IRON COMPLEX 150 MG PO CAPS: 150.0000 mg | ORAL_CAPSULE | Freq: Every day | ORAL | Status: DC

## 2012-03-17 MED ORDER — OXYCODONE-ACETAMINOPHEN 5-325 MG PO TABS: 1.0000 | ORAL_TABLET | Freq: Four times a day (QID) | ORAL | Status: DC | PRN

## 2012-03-17 NOTE — Progress Notes (Signed)
Patient ID: [name], female   DOB: Mar 23, 1981, 31 y.o.   MRN: [id number] POD # 2  Subjective: Pt reports feeling sore, but well and eager for early d/c home/ Pain controlled with percocet and ibuprofen Tolerating po/Voiding without problems/ No n/v/Flatus pos Activity: out of bed and ambulate Bleeding is light Newborn info:  Information for the patient's newborn:  Sabrina, [name] [[id number]]  female Feeding: bottle   Objective: VS: Blood pressure 114/75, pulse 85, temperature 97.7 F (36.5 C), temperature source Oral, resp. rate 18.   LABS:  Basename 03/16/12 0525 03/14/12 1845  WBC 17.8* 16.7*  HGB 9.7* 11.9*  HCT 29.1* 35.5*  PLT 224 349     Physical Exam:  General: alert, cooperative and no distress CV: Regular rate and rhythm Resp: clear Abdomen: soft, nontender, normal bowel sounds Incision: remains covered with dsg Uterine Fundus: firm, below umbilicus, nontender Lochia: minimal Ext: edema trace and Homans sign is negative, no sign of DVT    A/P: POD # 2 G1P1001/ S/P Primary C/Section d/t failure to progress Doing well and stable for early discharge home Staple removal in the office in 1 to 2 days; will call pt to schedule RX's: Ibuprofen 600mg  po Q 6 hrs prn pain #30 Refill x 1 Percocet 5/325 1 - 2 tabs po every 6 hrs prn pain  #30 No refill Niferex 150mg  po QD/BID #30/#60 Refill x 1 Colace 100mg  po 1 to 3 times po QD prn constipation #60 Ref x    Signed: Demetrius Revel, MSN, [facility] 03/17/2012, 8:59 AM

## 2012-03-18 NOTE — Discharge Summary (Signed)
Obstetric Discharge Summary Reason for Admission: [redacted] wk gestation with moderate polyhydramnios for induction Prenatal Procedures: NST and ultrasound Intrapartum Procedures: cesarean: low cervical, transverse Postpartum Procedures: none Complications-Operative and Postpartum: none Hemoglobin  Date Value Range Status  03/16/2012 9.7* 12.0 - 15.0 g/dL Final     DELTA CHECK NOTED     REPEATED TO VERIFY     HCT  Date Value Range Status  03/16/2012 29.1* 36.0 - 46.0 % Final    Physical Exam:  General: alert, cooperative and no distress Lochia: appropriate Uterine Fundus: firm Incision: healing well, and closed with staples.  Will have pt return for removal of staples on 3rd post op day DVT Evaluation: No evidence of DVT seen on physical exam.  Discharge Diagnoses: Term Pregnancy-delivered and ABL Anemia  Discharge Information: Date: 03/17/12 Activity: pelvic rest Diet: routine Medications: PNV, Ibuprofen, Colace, Iron and Percocet Condition: stable Instructions: refer to practice specific booklet Discharge to: home Follow-up Information    Follow up with [name], MD. In 6 weeks.   Contact information:   [name] [id number] [phone]          Newborn Data: Live born female on 03/15/12 Birth Weight: 8 lb 0.8 oz (3650 g) APGAR: 4, 7  Home with mother.  [name] 03/17/12

## 2014-03-29 ENCOUNTER — Encounter (HOSPITAL_COMMUNITY): Payer: Self-pay | Admitting: Obstetrics and Gynecology

## 2015-02-10 LAB — OB RESULTS CONSOLE ABO/RH: RH TYPE: POSITIVE

## 2015-02-10 LAB — OB RESULTS CONSOLE ANTIBODY SCREEN: ANTIBODY SCREEN: NEGATIVE

## 2015-02-10 LAB — OB RESULTS CONSOLE HEPATITIS B SURFACE ANTIGEN: Hepatitis B Surface Ag: NEGATIVE

## 2015-02-10 LAB — OB RESULTS CONSOLE RPR: RPR: NONREACTIVE

## 2015-02-10 LAB — OB RESULTS CONSOLE GC/CHLAMYDIA
CHLAMYDIA, DNA PROBE: NEGATIVE
Gonorrhea: NEGATIVE

## 2015-02-10 LAB — OB RESULTS CONSOLE RUBELLA ANTIBODY, IGM: RUBELLA: IMMUNE

## 2015-02-10 LAB — OB RESULTS CONSOLE HIV ANTIBODY (ROUTINE TESTING): HIV: NONREACTIVE

## 2015-07-06 ENCOUNTER — Encounter: Payer: Commercial Managed Care - HMO | Attending: Obstetrics and Gynecology

## 2015-07-06 VITALS — Ht 67.0 in | Wt 237.9 lb

## 2015-07-06 DIAGNOSIS — O24419 Gestational diabetes mellitus in pregnancy, unspecified control: Secondary | ICD-10-CM | POA: Diagnosis present

## 2015-07-06 DIAGNOSIS — O99810 Abnormal glucose complicating pregnancy: Secondary | ICD-10-CM | POA: Diagnosis not present

## 2015-07-06 DIAGNOSIS — Z3A15 15 weeks gestation of pregnancy: Secondary | ICD-10-CM | POA: Diagnosis not present

## 2015-07-12 NOTE — Progress Notes (Signed)
  Patient was seen on 07/06/15 for Gestational Diabetes self-management . The following learning objectives were met by the patient :   States the definition of Gestational Diabetes  States why dietary management is important in controlling blood glucose  Describes the effects of carbohydrates on blood glucose levels  Demonstrates ability to create a balanced meal plan  Demonstrates carbohydrate counting   States when to check blood glucose levels  Demonstrates proper blood glucose monitoring techniques  States the effect of stress and exercise on blood glucose levels  States the importance of limiting caffeine and abstaining from alcohol and smoking  Plan:  Aim for 2 Carb Choices per meal (30 grams) +/- 1 either way for breakfast Aim for 3 Carb Choices per meal (45 grams) +/- 1 either way from lunch and dinner Aim for 1-2 Carbs per snack Begin reading food labels for Total Carbohydrate and sugar grams of foods Consider  increasing your activity level by walking daily as tolerated Begin checking BG before breakfast and 1-2 hours after first bit of breakfast, lunch and dinner after  as directed by MD  Take medication  as directed by MD  Blood glucose monitor given: One Touch Verio Flex  Patient instructed to monitor glucose levels: FBS: 60 - <90 1 hour: <140 2 hour: <120  Patient received the following handouts:  Nutrition Diabetes and Pregnancy  Carbohydrate Counting List  Meal Planning worksheet  Patient will be seen for follow-up as needed.

## 2015-08-09 ENCOUNTER — Other Ambulatory Visit: Payer: Self-pay | Admitting: Obstetrics and Gynecology

## 2015-08-11 LAB — OB RESULTS CONSOLE GBS: GBS: NEGATIVE

## 2015-08-24 ENCOUNTER — Encounter (HOSPITAL_COMMUNITY): Payer: Self-pay | Admitting: *Deleted

## 2015-08-24 ENCOUNTER — Telehealth (HOSPITAL_COMMUNITY): Payer: Self-pay | Admitting: *Deleted

## 2015-08-24 NOTE — Telephone Encounter (Signed)
Preadmission screen  

## 2015-08-29 ENCOUNTER — Encounter (HOSPITAL_COMMUNITY): Payer: Self-pay | Admitting: *Deleted

## 2015-08-29 ENCOUNTER — Telehealth (HOSPITAL_COMMUNITY): Payer: Self-pay | Admitting: *Deleted

## 2015-08-29 NOTE — Telephone Encounter (Signed)
Preadmission screen  

## 2015-08-31 ENCOUNTER — Other Ambulatory Visit: Payer: Self-pay | Admitting: Obstetrics and Gynecology

## 2015-09-02 ENCOUNTER — Encounter (HOSPITAL_COMMUNITY)
Admission: RE | Admit: 2015-09-02 | Discharge: 2015-09-02 | Disposition: A | Discharge status: Home / Self Care | Payer: Commercial Managed Care - HMO | Source: Ambulatory Visit | Attending: Obstetrics and Gynecology | Admitting: Obstetrics and Gynecology

## 2015-09-02 LAB — BASIC METABOLIC PANEL
ANION GAP: 8 (ref 5–15)
BUN: 13 mg/dL (ref 6–20)
CO2: 22 mmol/L (ref 22–32)
Calcium: 8.6 mg/dL — ABNORMAL LOW (ref 8.9–10.3)
Chloride: 105 mmol/L (ref 101–111)
Creatinine, Ser: 0.61 mg/dL (ref 0.44–1.00)
GFR calc Af Amer: >60 mL/min (ref >60)
GLUCOSE: 99 mg/dL (ref 65–99)
POTASSIUM: 4.3 mmol/L (ref 3.5–5.1)
SODIUM: 135 mmol/L (ref 135–145)

## 2015-09-02 LAB — CBC
HEMATOCRIT: 31.1 % — AB (ref 36.0–46.0)
Hemoglobin: 10.2 g/dL — ABNORMAL LOW (ref 12.0–15.0)
MCH: 28.2 pg (ref 26.0–34.0)
MCHC: 32.8 g/dL (ref 30.0–36.0)
MCV: 85.9 fL (ref 78.0–100.0)
Platelets: 223 10*3/uL (ref 150–400)
RBC: 3.62 MIL/uL — ABNORMAL LOW (ref 3.87–5.11)
RDW: 14.5 % (ref 11.5–15.5)
WBC: 8.8 10*3/uL (ref 4.0–10.5)

## 2015-09-03 ENCOUNTER — Inpatient Hospital Stay (HOSPITAL_COMMUNITY)
Admission: AD | Admit: 2015-09-03 | Discharge: 2015-09-03 | Disposition: A | Discharge status: Home / Self Care | Payer: Commercial Managed Care - HMO | Source: Ambulatory Visit | Attending: Obstetrics and Gynecology | Admitting: Obstetrics and Gynecology

## 2015-09-03 ENCOUNTER — Inpatient Hospital Stay (HOSPITAL_COMMUNITY)
Admission: RE | Admit: 2015-09-03 | Discharge: 2015-09-05 | DRG: 765 | Disposition: A | Discharge status: Home / Self Care | Payer: Commercial Managed Care - HMO | Source: Ambulatory Visit | Attending: Obstetrics and Gynecology | Admitting: Obstetrics and Gynecology

## 2015-09-03 ENCOUNTER — Inpatient Hospital Stay (HOSPITAL_COMMUNITY): Payer: Commercial Managed Care - HMO | Admitting: Anesthesiology

## 2015-09-03 ENCOUNTER — Encounter (HOSPITAL_COMMUNITY)
Admission: RE | Disposition: A | Discharge status: Home / Self Care | Payer: Self-pay | Source: Ambulatory Visit | Attending: Obstetrics and Gynecology

## 2015-09-03 ENCOUNTER — Encounter (HOSPITAL_COMMUNITY): Payer: Self-pay | Admitting: *Deleted

## 2015-09-03 DIAGNOSIS — O9081 Anemia of the puerperium: Secondary | ICD-10-CM | POA: Diagnosis present

## 2015-09-03 DIAGNOSIS — Z3A39 39 weeks gestation of pregnancy: Secondary | ICD-10-CM | POA: Diagnosis not present

## 2015-09-03 DIAGNOSIS — O24420 Gestational diabetes mellitus in childbirth, diet controlled: Secondary | ICD-10-CM | POA: Diagnosis present

## 2015-09-03 DIAGNOSIS — D509 Iron deficiency anemia, unspecified: Secondary | ICD-10-CM | POA: Diagnosis present

## 2015-09-03 DIAGNOSIS — D62 Acute posthemorrhagic anemia: Secondary | ICD-10-CM | POA: Diagnosis present

## 2015-09-03 DIAGNOSIS — O34211 Maternal care for low transverse scar from previous cesarean delivery: Secondary | ICD-10-CM | POA: Diagnosis present

## 2015-09-03 DIAGNOSIS — Z98891 History of uterine scar from previous surgery: Secondary | ICD-10-CM

## 2015-09-03 DIAGNOSIS — O99344 Other mental disorders complicating childbirth: Secondary | ICD-10-CM | POA: Diagnosis present

## 2015-09-03 DIAGNOSIS — Z87891 Personal history of nicotine dependence: Secondary | ICD-10-CM | POA: Diagnosis not present

## 2015-09-03 DIAGNOSIS — Z302 Encounter for sterilization: Secondary | ICD-10-CM

## 2015-09-03 DIAGNOSIS — F419 Anxiety disorder, unspecified: Secondary | ICD-10-CM | POA: Diagnosis present

## 2015-09-03 LAB — GLUCOSE, CAPILLARY
GLUCOSE-CAPILLARY: 64 mg/dL — AB (ref 65–99)
Glucose-Capillary: 75 mg/dL (ref 65–99)

## 2015-09-03 LAB — RPR: RPR: NONREACTIVE

## 2015-09-03 LAB — PREPARE RBC (CROSSMATCH)

## 2015-09-03 SURGERY — Surgical Case
Anesthesia: Spinal | Laterality: Bilateral

## 2015-09-03 MED ORDER — PHENYLEPHRINE 8 MG IN D5W 100 ML (0.08MG/ML) PREMIX OPTIME
INJECTION | INTRAVENOUS | Status: DC | PRN
  Administered 2015-09-03: 60 ug/min via INTRAVENOUS

## 2015-09-03 MED ORDER — ERYTHROMYCIN 5 MG/GM OP OINT
TOPICAL_OINTMENT | OPHTHALMIC | Status: AC
  Filled 2015-09-03: qty 1

## 2015-09-03 MED ORDER — MORPHINE SULFATE (PF) 0.5 MG/ML IJ SOLN
INTRAMUSCULAR | Status: DC | PRN
  Administered 2015-09-03: .2 mg via INTRAVENOUS

## 2015-09-03 MED ORDER — PHENYLEPHRINE HCL 10 MG/ML IJ SOLN
INTRAMUSCULAR | Status: DC | PRN
  Administered 2015-09-03: 40 ug via INTRAVENOUS

## 2015-09-03 MED ORDER — CITRIC ACID-SODIUM CITRATE 334-500 MG/5ML PO SOLN
30.0000 mL | Freq: Once | ORAL | Status: AC
Start: 2015-09-03 — End: 2015-09-03
  Administered 2015-09-03: 30 mL via ORAL

## 2015-09-03 MED ORDER — KETOROLAC TROMETHAMINE 30 MG/ML IJ SOLN
INTRAMUSCULAR | Status: AC
  Filled 2015-09-03: qty 1

## 2015-09-03 MED ORDER — SCOPOLAMINE 1 MG/3DAYS TD PT72
MEDICATED_PATCH | TRANSDERMAL | Status: AC
  Filled 2015-09-03: qty 1

## 2015-09-03 MED ORDER — KETOROLAC TROMETHAMINE 30 MG/ML IJ SOLN
30.0000 mg | Freq: Once | INTRAMUSCULAR | Status: AC
  Administered 2015-09-03: 30 mg via INTRAMUSCULAR

## 2015-09-03 MED ORDER — DIBUCAINE 1 % RE OINT
1.0000 "application " | TOPICAL_OINTMENT | RECTAL | Status: DC | PRN
  Administered 2015-09-04: 1 via RECTAL
  Filled 2015-09-03: qty 28

## 2015-09-03 MED ORDER — SIMETHICONE 80 MG PO CHEW
80.0000 mg | CHEWABLE_TABLET | Freq: Three times a day (TID) | ORAL | Status: DC
  Administered 2015-09-04 – 2015-09-05 (×3): 80 mg via ORAL
  Filled 2015-09-03 (×4): qty 1

## 2015-09-03 MED ORDER — ZOLPIDEM TARTRATE 5 MG PO TABS: 5.0000 mg | ORAL_TABLET | Freq: Every evening | ORAL | Status: DC | PRN

## 2015-09-03 MED ORDER — ESCITALOPRAM OXALATE 20 MG PO TABS
20.0000 mg | ORAL_TABLET | Freq: Every day | ORAL | Status: DC
  Administered 2015-09-03 – 2015-09-04 (×2): 20 mg via ORAL
  Filled 2015-09-03 (×2): qty 1

## 2015-09-03 MED ORDER — FENTANYL CITRATE (PF) 100 MCG/2ML IJ SOLN
INTRAMUSCULAR | Status: DC | PRN
  Administered 2015-09-03: 10 ug via INTRATHECAL

## 2015-09-03 MED ORDER — SENNOSIDES-DOCUSATE SODIUM 8.6-50 MG PO TABS
2.0000 | ORAL_TABLET | ORAL | Status: DC
Start: 2015-09-04 — End: 2015-09-05
  Administered 2015-09-04 – 2015-09-05 (×2): 2 via ORAL
  Filled 2015-09-03 (×2): qty 2

## 2015-09-03 MED ORDER — LACTATED RINGERS IV SOLN
INTRAVENOUS | Status: DC
  Administered 2015-09-03 – 2015-09-04 (×2): via INTRAVENOUS

## 2015-09-03 MED ORDER — PHENYLEPHRINE 40 MCG/ML (10ML) SYRINGE FOR IV PUSH (FOR BLOOD PRESSURE SUPPORT)
PREFILLED_SYRINGE | INTRAVENOUS | Status: AC
  Filled 2015-09-03: qty 10

## 2015-09-03 MED ORDER — DIPHENHYDRAMINE HCL 25 MG PO CAPS
25.0000 mg | ORAL_CAPSULE | Freq: Four times a day (QID) | ORAL | Status: DC | PRN
  Filled 2015-09-03: qty 1

## 2015-09-03 MED ORDER — PHENYLEPHRINE 8 MG IN D5W 100 ML (0.08MG/ML) PREMIX OPTIME
INJECTION | INTRAVENOUS | Status: AC
  Filled 2015-09-03: qty 100

## 2015-09-03 MED ORDER — CITRIC ACID-SODIUM CITRATE 334-500 MG/5ML PO SOLN
ORAL | Status: AC
Start: 2015-09-03 — End: 2015-09-03
  Filled 2015-09-03: qty 15

## 2015-09-03 MED ORDER — LACTATED RINGERS IV SOLN
INTRAVENOUS | Status: DC | PRN
  Administered 2015-09-03 (×3): via INTRAVENOUS

## 2015-09-03 MED ORDER — DEXTROSE 5 % IV SOLN
2.0000 g | INTRAVENOUS | Status: AC
  Administered 2015-09-03: 2 g via INTRAVENOUS
  Filled 2015-09-03: qty 20

## 2015-09-03 MED ORDER — ONDANSETRON HCL 4 MG/2ML IJ SOLN
INTRAMUSCULAR | Status: DC | PRN
  Administered 2015-09-03: 4 mg via INTRAVENOUS

## 2015-09-03 MED ORDER — MORPHINE SULFATE (PF) 0.5 MG/ML IJ SOLN
INTRAMUSCULAR | Status: AC
  Filled 2015-09-03: qty 10

## 2015-09-03 MED ORDER — SIMETHICONE 80 MG PO CHEW
80.0000 mg | CHEWABLE_TABLET | ORAL | Status: DC
  Administered 2015-09-04 – 2015-09-05 (×2): 80 mg via ORAL
  Filled 2015-09-03 (×2): qty 1

## 2015-09-03 MED ORDER — SIMETHICONE 80 MG PO CHEW: 80.0000 mg | CHEWABLE_TABLET | ORAL | Status: DC | PRN

## 2015-09-03 MED ORDER — LACTATED RINGERS IV BOLUS (SEPSIS)
500.0000 mL | Freq: Once | INTRAVENOUS | Status: AC
  Administered 2015-09-03: 500 mL via INTRAVENOUS

## 2015-09-03 MED ORDER — LANOLIN HYDROUS EX OINT: 1.0000 "application " | TOPICAL_OINTMENT | CUTANEOUS | Status: DC | PRN

## 2015-09-03 MED ORDER — BUPIVACAINE IN DEXTROSE 0.75-8.25 % IT SOLN
INTRATHECAL | Status: DC | PRN
  Administered 2015-09-03: 13.5 mg via INTRATHECAL

## 2015-09-03 MED ORDER — OXYTOCIN 10 UNIT/ML IJ SOLN
INTRAMUSCULAR | Status: AC
  Filled 2015-09-03: qty 4

## 2015-09-03 MED ORDER — METHYLERGONOVINE MALEATE 0.2 MG PO TABS: 0.2000 mg | ORAL_TABLET | ORAL | Status: DC | PRN

## 2015-09-03 MED ORDER — MEPERIDINE HCL 25 MG/ML IJ SOLN
INTRAMUSCULAR | Status: DC | PRN
  Administered 2015-09-03: 25 mg via INTRAVENOUS

## 2015-09-03 MED ORDER — FENTANYL CITRATE (PF) 100 MCG/2ML IJ SOLN
INTRAMUSCULAR | Status: AC
  Filled 2015-09-03: qty 2

## 2015-09-03 MED ORDER — TETANUS-DIPHTH-ACELL PERTUSSIS 5-2.5-18.5 LF-MCG/0.5 IM SUSP: 0.5000 mL | Freq: Once | INTRAMUSCULAR | Status: DC

## 2015-09-03 MED ORDER — METHYLERGONOVINE MALEATE 0.2 MG/ML IJ SOLN: 0.2000 mg | INTRAMUSCULAR | Status: DC | PRN

## 2015-09-03 MED ORDER — BUPIVACAINE HCL (PF) 0.25 % IJ SOLN
INTRAMUSCULAR | Status: AC
  Filled 2015-09-03: qty 30

## 2015-09-03 MED ORDER — WITCH HAZEL-GLYCERIN EX PADS
1.0000 "application " | MEDICATED_PAD | CUTANEOUS | Status: DC | PRN
  Administered 2015-09-04: 1 via TOPICAL

## 2015-09-03 MED ORDER — PRENATAL MULTIVITAMIN CH
1.0000 | ORAL_TABLET | Freq: Every day | ORAL | Status: DC
  Administered 2015-09-04: 1 via ORAL
  Filled 2015-09-03: qty 1

## 2015-09-03 MED ORDER — MENTHOL 3 MG MT LOZG: 1.0000 | LOZENGE | OROMUCOSAL | Status: DC | PRN

## 2015-09-03 MED ORDER — MEPERIDINE HCL 25 MG/ML IJ SOLN
INTRAMUSCULAR | Status: AC
Start: 2015-09-03 — End: 2015-09-03
  Filled 2015-09-03: qty 1

## 2015-09-03 MED ORDER — ONDANSETRON HCL 4 MG/2ML IJ SOLN
INTRAMUSCULAR | Status: AC
  Filled 2015-09-03: qty 2

## 2015-09-03 MED ORDER — BUPIVACAINE HCL (PF) 0.25 % IJ SOLN
INTRAMUSCULAR | Status: DC | PRN
  Administered 2015-09-03: 30 mL

## 2015-09-03 MED ORDER — OXYTOCIN 10 UNIT/ML IJ SOLN: 2.5000 [IU]/h | INTRAVENOUS | Status: AC

## 2015-09-03 MED ORDER — ACETAMINOPHEN 325 MG PO TABS
650.0000 mg | ORAL_TABLET | ORAL | Status: DC | PRN
  Administered 2015-09-03 – 2015-09-05 (×8): 650 mg via ORAL
  Filled 2015-09-03 (×8): qty 2

## 2015-09-03 MED ORDER — OXYTOCIN 10 UNIT/ML IJ SOLN
40.0000 [IU] | INTRAVENOUS | Status: DC | PRN
  Administered 2015-09-03: 40 [IU] via INTRAVENOUS

## 2015-09-03 MED ORDER — IBUPROFEN 600 MG PO TABS
600.0000 mg | ORAL_TABLET | Freq: Four times a day (QID) | ORAL | Status: DC
  Administered 2015-09-03 – 2015-09-05 (×8): 600 mg via ORAL
  Filled 2015-09-03 (×8): qty 1

## 2015-09-03 SURGICAL SUPPLY — 38 items
APL SKNCLS STERI-STRIP NONHPOA (GAUZE/BANDAGES/DRESSINGS) ×1
BENZOIN TINCTURE PRP APPL 2/3 (GAUZE/BANDAGES/DRESSINGS) ×1 IMPLANT
CHLORAPREP W/TINT 26ML (MISCELLANEOUS) ×2 IMPLANT
CLAMP CORD UMBIL (MISCELLANEOUS) IMPLANT
CLOTH BEACON ORANGE TIMEOUT ST (SAFETY) ×2 IMPLANT
CONTAINER PREFILL 10% NBF 15ML (MISCELLANEOUS) IMPLANT
DRSG OPSITE POSTOP 4X10 (GAUZE/BANDAGES/DRESSINGS) ×2 IMPLANT
ELECT REM PT RETURN 9FT ADLT (ELECTROSURGICAL) ×2
ELECTRODE REM PT RTRN 9FT ADLT (ELECTROSURGICAL) ×1 IMPLANT
EXTRACTOR VACUUM M CUP 4 TUBE (SUCTIONS) IMPLANT
GLOVE BIO SURGEON STRL SZ7.5 (GLOVE) ×2 IMPLANT
GLOVE BIOGEL PI IND STRL 7.0 (GLOVE) ×2 IMPLANT
GLOVE BIOGEL PI INDICATOR 7.0 (GLOVE) ×2
GOWN STRL REUS W/TWL LRG LVL3 (GOWN DISPOSABLE) ×6 IMPLANT
KIT ABG SYR 3ML LUER SLIP (SYRINGE) IMPLANT
NDL HYPO 25X5/8 SAFETYGLIDE (NEEDLE) IMPLANT
NDL SPNL 20GX3.5 QUINCKE YW (NEEDLE) IMPLANT
NEEDLE HYPO 22GX1.5 SAFETY (NEEDLE) ×2 IMPLANT
NEEDLE HYPO 25X5/8 SAFETYGLIDE (NEEDLE) IMPLANT
NEEDLE SPNL 20GX3.5 QUINCKE YW (NEEDLE) IMPLANT
NS IRRIG 1000ML POUR BTL (IV SOLUTION) ×2 IMPLANT
PACK C SECTION WH (CUSTOM PROCEDURE TRAY) ×2 IMPLANT
PAD OB MATERNITY 4.3X12.25 (PERSONAL CARE ITEMS) ×2 IMPLANT
STERI STRIP BROWN 1/4X3 R155 (GAUZE/BANDAGES/DRESSINGS) ×1 IMPLANT
STRIP CLOSURE SKIN 1/2X4 (GAUZE/BANDAGES/DRESSINGS) ×1 IMPLANT
SUT MNCRL 0 VIOLET CTX 36 (SUTURE) ×2 IMPLANT
SUT MNCRL AB 3-0 PS2 27 (SUTURE) IMPLANT
SUT MON AB 2-0 CT1 27 (SUTURE) ×2 IMPLANT
SUT MON AB-0 CT1 36 (SUTURE) ×4 IMPLANT
SUT MONOCRYL 0 CTX 36 (SUTURE) ×2
SUT PLAIN 0 NONE (SUTURE) IMPLANT
SUT PLAIN 2 0 (SUTURE)
SUT PLAIN 2 0 XLH (SUTURE) IMPLANT
SUT PLAIN ABS 2-0 CT1 27XMFL (SUTURE) IMPLANT
SYR 20CC LL (SYRINGE) IMPLANT
SYR CONTROL 10ML LL (SYRINGE) ×2 IMPLANT
TOWEL OR 17X24 6PK STRL BLUE (TOWEL DISPOSABLE) ×2 IMPLANT
TRAY FOLEY CATH SILVER 14FR (SET/KITS/TRAYS/PACK) ×2 IMPLANT

## 2015-09-03 NOTE — Transfer of Care (Signed)
Immediate Anesthesia Transfer of Care Note  Patient: [name]  Procedure(s) Performed: Procedure(s) with comments: REPEAT CESAREAN SECTION WITH BILATERAL TUBAL LIGATION (Bilateral) - EDD: 09/10/15  Patient Location: PACU  Anesthesia Type:Spinal  Level of Consciousness: awake, alert  and oriented  Airway & Oxygen Therapy: Patient Spontanous Breathing  Post-op Assessment: Report given to RN  Post vital signs: Reviewed  Last Vitals:  Filed Vitals:   09/03/15 1012  BP: 115/69  Pulse: 129  Temp: 123XX123 C    Complications: No apparent anesthesia complications

## 2015-09-03 NOTE — Progress Notes (Signed)
MOB was referred for history of depression/anxiety. * Referral screened out by Clinical Social Worker because none of the following criteria appear to apply: ~ History of anxiety/depression during this pregnancy, or of post-partum depression. ~ Diagnosis of anxiety and/or depression within last 3 years OR * MOB's symptoms currently being treated with medication and/or therapy. Please contact the Clinical Social Worker if needs arise, or if MOB requests.  MOB has rx for Buspar.

## 2015-09-03 NOTE — Anesthesia Procedure Notes (Signed)
Spinal Patient location during procedure: OR Staffing Anesthesiologist: Suzette Battiest Performed by: anesthesiologist  Preanesthetic Checklist Completed: patient identified, site marked, surgical consent, pre-op evaluation, timeout performed, IV checked, risks and benefits discussed and monitors and equipment checked Spinal Block Patient position: sitting Prep: site prepped and draped and DuraPrep Patient monitoring: heart rate, continuous pulse ox and blood pressure Approach: midline Location: L3-4 Injection technique: single-shot Needle Needle type: Pencan  Needle gauge: 24 G Needle length: 9 cm Assessment Sensory level: T6

## 2015-09-03 NOTE — Anesthesia Preprocedure Evaluation (Addendum)
Anesthesia Evaluation  Patient identified by MRN, date of birth, ID band Patient awake    Reviewed: Allergy & Precautions, NPO status , Patient's Chart, lab work & pertinent test results  Airway Mallampati: II  TM Distance: >3 FB Neck ROM: Full    Dental   Pulmonary former smoker,    Pulmonary exam normal        Cardiovascular negative cardio ROS Normal cardiovascular exam     Neuro/Psych Anxiety negative neurological ROS     GI/Hepatic negative GI ROS, Neg liver ROS,   Endo/Other  diabetes, Gestational  Renal/GU negative Renal ROS     Musculoskeletal   Abdominal   Peds  Hematology  (+) anemia ,   Anesthesia Other Findings   Reproductive/Obstetrics (+) Pregnancy (IUP for repeat c/s and tubal ligation)                            Lab Results  Component Value Date   WBC 8.8 09/02/2015   HGB 10.2* 09/02/2015   HCT 31.1* 09/02/2015   MCV 85.9 09/02/2015   PLT 223 09/02/2015   Lab Results  Component Value Date   CREATININE 0.61 09/02/2015   BUN 13 09/02/2015   NA 135 09/02/2015   K 4.3 09/02/2015   CL 105 09/02/2015   CO2 22 09/02/2015    Anesthesia Physical Anesthesia Plan  ASA: II  Anesthesia Plan: Spinal   Post-op Pain Management:    Induction:   Airway Management Planned: Natural Airway  Additional Equipment:   Intra-op Plan:   Post-operative Plan:   Informed Consent: I have reviewed the patients History and Physical, chart, labs and discussed the procedure including the risks, benefits and alternatives for the proposed anesthesia with the patient or authorized representative who has indicated his/her understanding and acceptance.     Plan Discussed with: CRNA  Anesthesia Plan Comments:         Anesthesia Quick Evaluation

## 2015-09-03 NOTE — H&P (Signed)
[name] is a 35 y.o. female presenting for rpt csection and TL.  Maternal Medical History:  Reason for admission: Contractions.   Contractions: Onset was less than 1 hour ago.   Frequency: rare.   Perceived severity is mild.    Fetal activity: Perceived fetal activity is normal.   Last perceived fetal movement was within the past hour.    Prenatal complications: no prenatal complications Prenatal Complications - Diabetes: gestational. Diabetes is managed by diet.      OB History    Gravida Para Term Preterm AB TAB SAB Ectopic Multiple Living   2 1 1  0 0 0 0 0 0 1     Past Medical History  Diagnosis Date  . Fever blister     history  . Anxiety   . Abdominal pain     r/t gallstones  . Diarrhea     history - diarrhea since gall bladder attacks per pt  . Nausea & vomiting     history  . Gallstones   . Gestational diabetes mellitus (GDM), antepartum   . Hx of varicella   . Hemorrhoids   . Gestational diabetes     diet controlled   Past Surgical History  Procedure Laterality Date  . Wisdom tooth extraction  2002  . Cholecystectomy  12/07/2011    Procedure: LAPAROSCOPIC CHOLECYSTECTOMY;  Surgeon: Harl Bowie, MD;  Location: [facility];  Service: General;  Laterality: N/A;  . Cesarean section  03/15/2012    Procedure: CESAREAN SECTION;  Surgeon: Lovenia Kim, MD;  Location: [facility];  Service: Obstetrics;  Laterality: N/A;  Primary cesarean section with delivery of baby girl at 88. Apgars 4/7.   Family History: family history includes Cancer in her mother; Diabetes in her mother. There is no history of Other. Social History:  reports that she quit smoking about 4 years ago. Her smoking use included Cigarettes. She has a 13 pack-year smoking history. She has never used smokeless tobacco. She reports that she does not drink alcohol or use illicit drugs.   Prenatal Transfer Tool  Maternal Diabetes: Yes:  Diabetes Type:  Diet controlled Genetic Screening:  Normal Maternal Ultrasounds/Referrals: Normal Fetal Ultrasounds or other Referrals:  None Maternal Substance Abuse:  No Significant Maternal Medications:  None Significant Maternal Lab Results:  None Other Comments:  None  Review of Systems  Constitutional: Negative.   All other systems reviewed and are negative.     Last menstrual period 12/11/2014, unknown if currently breastfeeding. Maternal Exam:  Uterine Assessment: Contraction strength is mild.  Contraction frequency is rare.   Abdomen: Patient reports no abdominal tenderness. Surgical scars: low transverse.   Fetal presentation: vertex  Introitus: Normal vulva. Normal vagina.  Ferning test: not done.  Nitrazine test: not done. Amniotic fluid character: not assessed.  Pelvis: questionable for delivery.   Cervix: Cervix evaluated by digital exam.     Physical Exam  Nursing note and vitals reviewed. Constitutional: She is oriented to person, place, and time. She appears well-developed and well-nourished.  HENT:  Head: Normocephalic and atraumatic.  Neck: Normal range of motion. Neck supple.  Cardiovascular: Normal rate and regular rhythm.   Respiratory: Effort normal and breath sounds normal.  GI: Soft. Bowel sounds are normal.  Genitourinary: Vagina normal and uterus normal.  Musculoskeletal: Normal range of motion.  Neurological: She is alert and oriented to person, place, and time. She has normal reflexes.  Skin: Skin is warm and dry.  Psychiatric: She has a normal  mood and affect.    Prenatal labs: ABO, Rh: --/--/O POS (04/07 1140) Antibody: NEG (04/07 1140) Rubella: Immune (09/15 0000) RPR: Non Reactive (04/07 1140)  HBsAg: Negative (09/15 0000)  HIV: Non-reactive (09/15 0000)  GBS: Negative (03/16 0000)   Assessment/Plan: 39 week IUP GDM- diet controlled Previous csection Rpt csection and TL Risks vs benefits of surgery discussed. Consent done.   Eleshia Wooley J 09/03/2015, 10:08 AM

## 2015-09-03 NOTE — Anesthesia Postprocedure Evaluation (Signed)
Anesthesia Post Note  Patient: [name]  Procedure(s) Performed: Procedure(s) (LRB): REPEAT CESAREAN SECTION WITH BILATERAL TUBAL LIGATION (Bilateral)  Patient location during evaluation: PACU Anesthesia Type: Spinal and MAC Level of consciousness: awake and alert Pain management: pain level controlled Vital Signs Assessment: post-procedure vital signs reviewed and stable Respiratory status: spontaneous breathing and respiratory function stable Cardiovascular status: blood pressure returned to baseline and stable Postop Assessment: spinal receding Anesthetic complications: no    Last Vitals:  Filed Vitals:   09/03/15 1415 09/03/15 1416  BP: 110/63   Pulse: 80 74  Temp:    Resp: 19 18    Last Pain: There were no vitals filed for this visit.               Tiajuana Amass

## 2015-09-03 NOTE — Op Note (Signed)
Cesarean Section Procedure Note  Indications: previous uterine incision kerr x one and elective sterilization  Pre-operative Diagnosis: 39 week 0 day pregnancy.  Post-operative Diagnosis: same  Surgeon: Lovenia Kim   Assistants: Cora Collum, facm  Anesthesia: Local anesthesia 0.25.% bupivacaine and Spinal anesthesia  ASA Class: 2  Procedure Details  The patient was seen in the Holding Room. The risks, benefits, complications, treatment options, and expected outcomes were discussed with the patient.  The patient concurred with the proposed plan, giving informed consent. The risks of anesthesia, infection, bleeding and possible injury to other organs discussed. Injury to bowel, bladder, or ureter with possible need for repair discussed. Possible need for transfusion with secondary risks of hepatitis or HIV acquisition discussed. Post operative complications to include but not limited to DVT, PE and Pneumonia noted. The site of surgery properly noted/marked. The patient was taken to Operating Room # 9, identified as [name] and the procedure verified as C-Section Delivery. A Time Out was held and the above information confirmed.  After induction of anesthesia, the patient was draped and prepped in the usual sterile manner. A Pfannenstiel incision was made and carried down through the subcutaneous tissue to the fascia. Fascial incision was made and extended transversely using Mayo scissors. The fascia was separated from the underlying rectus tissue superiorly and inferiorly. The peritoneum was identified and entered. Peritoneal incision was extended longitudinally. The utero-vesical peritoneal reflection was incised transversely and the bladder flap was bluntly freed from the lower uterine segment. A low transverse uterine incision(Kerr hysterotomy) was made. Delivered from OA presentation was a  Female with Apgar scores of 9 at one minute and 9 at five minutes.  Loose [place] reduced before  delivery. Bulb suctioning gently performed. Neonatal team in attendance.After the umbilical cord was clamped and cut cord blood was obtained for evaluation. The placenta was removed intact and appeared normal. The uterus was curetted with a dry lap pack. Good hemostasis was noted.The uterine outline, tubes and ovaries appeared normal. Bilateral modified [place]oy tubal ligation.  The uterine incision was closed with running locked sutures of 0 Monocryl x 1 layers. Hemostasis was observed. Lavage was carried out until clear.The parietal peritoneum was closed with a running 2-0 Monocryl suture. The fascia was then reapproximated with running sutures of 0 Monocryl. The skin was reapproximated with 3-0 monocryl after [place] closure with 2-0 plain.  Instrument, sponge, and needle counts were correct prior the abdominal closure and at the conclusion of the case.   Findings: As noted  Estimated Blood Loss:  300 mL         Drains: foley                 Specimens: placenta and tubal segments                 Complications:  None; patient tolerated the procedure well.         Disposition: PACU - hemodynamically stable.         Condition: stable  Attending Attestation: I performed the procedure.

## 2015-09-04 LAB — CBC
HCT: 26.7 % — ABNORMAL LOW (ref 36.0–46.0)
Hemoglobin: 8.8 g/dL — ABNORMAL LOW (ref 12.0–15.0)
MCH: 28.6 pg (ref 26.0–34.0)
MCHC: 33 g/dL (ref 30.0–36.0)
MCV: 86.7 fL (ref 78.0–100.0)
Platelets: 165 10*3/uL (ref 150–400)
RBC: 3.08 MIL/uL — ABNORMAL LOW (ref 3.87–5.11)
RDW: 14.4 % (ref 11.5–15.5)
WBC: 8 10*3/uL (ref 4.0–10.5)

## 2015-09-04 LAB — BIRTH TISSUE RECOVERY COLLECTION (PLACENTA DONATION)

## 2015-09-04 NOTE — Progress Notes (Addendum)
POSTOPERATIVE DAY # 1 CS   S:         Reports feeling little anxious and tired             Tolerating po intake / no nausea / no vomiting / no flatus / no BM             Bleeding is light             Pain controlled with motrin             Up ad lib / ambulatory/ voiding QS  Newborn bottle feeding  / female  O:  VS: BP 103/51 mmHg  Pulse 73  Temp(Src) 98.2 F (36.8 C) (Oral)  Resp 20  SpO2 97%  LMP 12/11/2014  Breastfeeding? Unknown   LABS:               Recent Labs  09/02/15 1140 09/04/15 0608  WBC 8.8 8.0  HGB 10.2* 8.8*  PLT 223 165               Bloodtype: --/--/O POS (04/07 1140)  Rubella: Immune (09/15 0000)                                             I&O: Intake/Output      04/08 0701 - 04/09 0700 04/09 0701 - 04/10 0700   I.V. 2700    Total Intake 2700     Urine 1700 800   Blood 700    Total Output 2400 800   Net +300 -800                     Physical Exam:             Alert and Oriented X3  Lungs: Clear and unlabored  Heart: regular rate and rhythm / no mumurs  Abdomen: soft, non-tender, mildly distended              Fundus: firm, non-tender, Ueven             Dressing intact honeycomb              Incision:  approximated with suture / no erythema / no ecchymosis / no drainage  Perineum: intact  Lochia: light  Extremities: traceedema, no calf pain or tenderness, SCD  A:        POD # 1 S/P CS             IDA with ABL anemia  P:        Routine postoperative care              Advance post-op activity             Iron and magnesium   Artelia Laroche CNM, MSN, FACNM 09/04/2015, 10:19 AM

## 2015-09-05 ENCOUNTER — Encounter (HOSPITAL_COMMUNITY): Payer: Self-pay | Admitting: Obstetrics and Gynecology

## 2015-09-05 DIAGNOSIS — Z98891 History of uterine scar from previous surgery: Secondary | ICD-10-CM

## 2015-09-05 MED ORDER — ESCITALOPRAM OXALATE 20 MG PO TABS: 20.0000 mg | ORAL_TABLET | Freq: Every day | ORAL | Status: DC

## 2015-09-05 MED ORDER — MAGNESIUM OXIDE 400 MG PO TABS: 400.0000 mg | ORAL_TABLET | Freq: Every day | ORAL | Status: DC

## 2015-09-05 MED ORDER — ALPRAZOLAM 0.25 MG PO TABS
0.2500 mg | ORAL_TABLET | Freq: Three times a day (TID) | ORAL | Status: DC | PRN
  Administered 2015-09-05: 0.25 mg via ORAL
  Filled 2015-09-05: qty 1

## 2015-09-05 MED ORDER — POLYSACCHARIDE IRON COMPLEX 150 MG PO CAPS: 150.0000 mg | ORAL_CAPSULE | Freq: Every day | ORAL | Status: DC

## 2015-09-05 MED ORDER — ALPRAZOLAM 0.25 MG PO TABS: 0.2500 mg | ORAL_TABLET | Freq: Three times a day (TID) | ORAL | Status: DC | PRN

## 2015-09-05 NOTE — Progress Notes (Addendum)
POSTOPERATIVE DAY # 2 S/P CS    S:         Reports feeling ok - used xanax for increasing anxiety / wants to go home             Tolerating po intake / no nausea / no vomiting / + flatus / + BM             Bleeding is light             Pain controlled with motrin & Tylenol             Up ad lib / ambulatory/ voiding QS  Newborn breast feeding   O:  VS: BP 111/71 mmHg  Pulse 81  Temp(Src) 98.4 F (36.9 C) (Oral)  Resp 16  SpO2 99%  LMP 12/11/2014  Breastfeeding? Unknown   LABS:               Recent Labs  09/02/15 1140 09/04/15 0608  WBC 8.8 8.0  HGB 10.2* 8.8*  PLT 223 165               Bloodtype: --/--/O POS (04/07 1140)  Rubella: Immune (09/15 0000)                                 Physical Exam:             Alert and Oriented X3  Abdomen: soft, non-tender, non-distended, active BS             Fundus: firm, non-tender, Ueven             Dressing intact honeycomb              Incision:  approximated with suture / no erythema / no ecchymosis / no drainage  Perineum: intact  Lochia: light  Extremities: 1+ edema, no calf pain or tenderness, negative  [place]  A:        POD # 2 S/P CS            IDA - stable            Known anxiety disorder on medication  P:        Routine postoperative care              DC home - [facility] booklet -instructions reviewed             Rx Xanax for interval use until mental health provider visit - aware cannot use if decides to BF             Iron and magnesium daily    Artelia Laroche CNM, MSN, [facility] 09/05/2015, 9:41 AM

## 2015-09-05 NOTE — Discharge Summary (Addendum)
POSTOPERATIVE DISCHARGE SUMMARY:  Patient ID: [name] MRN: [id number] DOB/AGE: 1980-09-05 35 y.o.  Admit date: 09/03/2015 Admission Diagnoses: 39 weeks /undesired fertility  Discharge date:  09/05/2015 Discharge Diagnoses: POD 2 s/p cesarean section with BTL  Prenatal history: G2P2002   EDC : 09/10/2015, by Other Basis  Prenatal care at [facility]  Primary provider : taavon Prenatal course complicated by previous CS  Prenatal Labs: ABO, Rh: --/--/O POS (04/07 1140) Antibody: NEG (04/07 1140) Rubella: Immune (09/15 0000)   RPR: Non Reactive (04/07 1140)  HBsAg: Negative (09/15 0000)  HIV: Non-reactive (09/15 0000)  GBS: Negative (03/16 0000)   Medical / Surgical History :  Past medical history:  Past Medical History  Diagnosis Date  . Fever blister     history  . Anxiety   . Abdominal pain     r/t gallstones  . Diarrhea     history - diarrhea since gall bladder attacks per pt  . Nausea & vomiting     history  . Gallstones   . Gestational diabetes mellitus (GDM), antepartum   . Hx of varicella   . Hemorrhoids   . Gestational diabetes     diet controlled    Past surgical history:  Past Surgical History  Procedure Laterality Date  . Wisdom tooth extraction  2002  . Cholecystectomy  12/07/2011    Procedure: LAPAROSCOPIC CHOLECYSTECTOMY;  Surgeon: Harl Bowie, MD;  Location: [facility];  Service: General;  Laterality: N/A;  . Cesarean section  03/15/2012    Procedure: CESAREAN SECTION;  Surgeon: Lovenia Kim, MD;  Location: [facility];  Service: Obstetrics;  Laterality: N/A;  Primary cesarean section with delivery of baby girl at 53. Apgars 4/7.    Family History:  Family History  Problem Relation Age of Onset  . Diabetes Mother   . Cancer Mother     no sure of tupe, was on her shoulder blade  . Other Neg Hx     Social History:  reports that she quit smoking about 4 years ago. Her smoking use included Cigarettes. She has a 13  pack-year smoking history. She has never used smokeless tobacco. She reports that she does not drink alcohol or use illicit drugs.  Allergies: Review of patient's allergies indicates no known allergies.   Current Medications at time of admission:  Prior to Admission medications   Medication Sig Start Date End Date Taking? Authorizing Provider  escitalopram (LEXAPRO) 20 MG tablet Take 20 mg by mouth at bedtime.   Yes Historical Provider, MD  Prenatal Vit-Fe Fumarate-FA (PRENATAL MULTIVITAMIN) TABS Take 1 tablet by mouth every evening.   Yes Historical Provider, MD  ALPRAZolam (XANAX) 0.25 MG tablet Take 1 tablet (0.25 mg total) by mouth 3 (three) times daily as needed for anxiety. 09/05/15   Artelia Laroche, CNM  busPIRone (BUSPAR) 5 MG tablet Take 5 mg by mouth 2 (two) times daily as needed (anxiety).  08/11/15   Historical Provider, MD  escitalopram (LEXAPRO) 20 MG tablet Take 1 tablet (20 mg total) by mouth at bedtime. 09/05/15   Artelia Laroche, CNM    Procedures: Cesarean section delivery on 09/03/2015 with delivery of viable newborn by Dr Ronita Hipps   See operative report for further details APGAR (1 MIN): 8   APGAR (5 MINS): 8    Postoperative / postpartum course:  Uncomplicated with discharge on POD 2   Discharge Instructions:  Discharged Condition: stable  Activity: pelvic rest and postoperative restrictions x 2  Diet: routine  Medications:    Medication List    TAKE these medications        ALPRAZolam 0.25 MG tablet  Commonly known as:  XANAX  Take 1 tablet (0.25 mg total) by mouth 3 (three) times daily as needed for anxiety.     busPIRone 5 MG tablet  Commonly known as:  BUSPAR  Take 5 mg by mouth 2 (two) times daily as needed (anxiety).     escitalopram 20 MG tablet  Commonly known as:  LEXAPRO  Take 1 tablet (20 mg total) by mouth at bedtime.     escitalopram 20 MG tablet  Commonly known as:  LEXAPRO  Take 20 mg by mouth at bedtime.     iron polysaccharides 150  MG capsule  Commonly known as:  NIFEREX  Take 1 capsule (150 mg total) by mouth daily.     magnesium oxide 400 MG tablet  Commonly known as:  MAG-OX  Take 1 tablet (400 mg total) by mouth daily. Take with iron to PREVENT constipation     prenatal multivitamin Tabs tablet  Take 1 tablet by mouth every evening.        Wound Care: keep clean and dry / remove honeycomb POD 5 Postpartum Instructions: [facility] discharge booklet - instructions reviewed  Discharge to: Home  Follow up :   [facility] in 6 weeks for routine postpartum visit with Dr Sallyanne Havers test 6-12 weeks postpartum                Signed: Artelia Laroche CNM, MSN, [facility] 09/05/2015, 10:44 AM

## 2015-09-05 NOTE — Progress Notes (Signed)
Baby to nursery   Mom emotional  Request xanax which she has had before    Dr Ronita Hipps called

## 2015-09-06 LAB — TYPE AND SCREEN
ABO/RH(D): O POS
Antibody Screen: NEGATIVE
UNIT DIVISION: 0
Unit division: 0

## 2017-04-19 ENCOUNTER — Emergency Department (HOSPITAL_COMMUNITY)
Admission: EM | Admit: 2017-04-19 | Discharge: 2017-04-19 | Disposition: A | Discharge status: Home / Self Care | Payer: 59 | Attending: Emergency Medicine | Admitting: Emergency Medicine

## 2017-04-19 ENCOUNTER — Encounter (HOSPITAL_COMMUNITY): Payer: Self-pay

## 2017-04-19 DIAGNOSIS — H5712 Ocular pain, left eye: Secondary | ICD-10-CM | POA: Diagnosis present

## 2017-04-19 DIAGNOSIS — X58XXXA Exposure to other specified factors, initial encounter: Secondary | ICD-10-CM | POA: Insufficient documentation

## 2017-04-19 DIAGNOSIS — Z87891 Personal history of nicotine dependence: Secondary | ICD-10-CM | POA: Insufficient documentation

## 2017-04-19 DIAGNOSIS — Y929 Unspecified place or not applicable: Secondary | ICD-10-CM | POA: Insufficient documentation

## 2017-04-19 DIAGNOSIS — Y939 Activity, unspecified: Secondary | ICD-10-CM | POA: Diagnosis not present

## 2017-04-19 DIAGNOSIS — S0502XA Injury of conjunctiva and corneal abrasion without foreign body, left eye, initial encounter: Secondary | ICD-10-CM

## 2017-04-19 DIAGNOSIS — Y999 Unspecified external cause status: Secondary | ICD-10-CM | POA: Insufficient documentation

## 2017-04-19 MED ORDER — ERYTHROMYCIN 5 MG/GM OP OINT
TOPICAL_OINTMENT | Freq: Three times a day (TID) | OPHTHALMIC | Status: DC
  Administered 2017-04-19: 21:00:00 via OPHTHALMIC
  Filled 2017-04-19: qty 3.5

## 2017-04-19 MED ORDER — LIDOCAINE-EPINEPHRINE-TETRACAINE (LET) SOLUTION
3.0000 mL | Freq: Once | NASAL | Status: DC
  Filled 2017-04-19: qty 3

## 2017-04-19 MED ORDER — DIPHENHYDRAMINE HCL 50 MG/ML IJ SOLN
50.0000 mg | Freq: Once | INTRAMUSCULAR | Status: AC
  Administered 2017-04-19: 50 mg via INTRAMUSCULAR
  Filled 2017-04-19: qty 1

## 2017-04-19 MED ORDER — TETRACAINE HCL 0.5 % OP SOLN
1.0000 [drp] | Freq: Once | OPHTHALMIC | Status: AC
  Administered 2017-04-19: 1 [drp] via OPHTHALMIC
  Filled 2017-04-19: qty 4

## 2017-04-19 MED ORDER — FLUORESCEIN SODIUM 1 MG OP STRP
1.0000 | ORAL_STRIP | Freq: Once | OPHTHALMIC | Status: AC
  Administered 2017-04-19: 1 via OPHTHALMIC
  Filled 2017-04-19: qty 1

## 2017-04-19 NOTE — ED Provider Notes (Signed)
[facility] DEPT Provider Note   CSN: [id number] Arrival date & time: 04/19/17  1859     History   Chief Complaint Chief Complaint  Patient presents with  . Eye Problem    HPI [name] is a 36 y.o. female who presents to the ED with c/o left eye irritation. Patient reports that she was rubbing her eye and it started bothering her. The symptoms started about 30 minutes prior to arrival to the ED. Patient reports her eye has been watering and starting to swell. Patient reports that she had her contact in when she was rubbing the eye. She took it out after it started to swell.   HPI  Past Medical History:  Diagnosis Date  . Abdominal pain    r/t gallstones  . Anxiety   . Diarrhea    history - diarrhea since gall bladder attacks per pt  . Fever blister    history  . Gallstones   . Gestational diabetes    diet controlled  . Gestational diabetes mellitus (GDM), antepartum   . Hemorrhoids   . Hx of varicella   . Nausea & vomiting    history    Patient Active Problem List   Diagnosis Date Noted  . Previous cesarean section 09/05/2015  . Postpartum care following cesarean delivery with btl (4/8) 09/04/2015  . Symptomatic cholelithiasis 11/20/2011    Past Surgical History:  Procedure Laterality Date  . CESAREAN SECTION  03/15/2012   Procedure: CESAREAN SECTION;  Surgeon: Lovenia Kim, MD;  Location: [facility];  Service: Obstetrics;  Laterality: N/A;  Primary cesarean section with delivery of baby girl at 35. Apgars 4/7.  Marland Kitchen CESAREAN SECTION WITH BILATERAL TUBAL LIGATION Bilateral 09/03/2015   Procedure: REPEAT CESAREAN SECTION WITH BILATERAL TUBAL LIGATION;  Surgeon: Brien Few, MD;  Location: [facility];  Service: Obstetrics;  Laterality: Bilateral;  EDD: 09/10/15  . CHOLECYSTECTOMY  12/07/2011   Procedure: LAPAROSCOPIC CHOLECYSTECTOMY;  Surgeon: Harl Bowie, MD;  Location: [facility];  Service: General;  Laterality: N/A;  . WISDOM  TOOTH EXTRACTION  2002    OB History    Gravida Para Term Preterm AB Living   2 2 2  0 0 2   SAB TAB Ectopic Multiple Live Births   0 0 0 0 2       Home Medications    Prior to Admission medications   Medication Sig Start Date End Date Taking? Authorizing Provider  ALPRAZolam (XANAX) 0.25 MG tablet Take 1 tablet (0.25 mg total) by mouth 3 (three) times daily as needed for anxiety. 09/05/15   Artelia Laroche, CNM  busPIRone (BUSPAR) 5 MG tablet Take 5 mg by mouth 2 (two) times daily as needed (anxiety).  08/11/15   [provider]  escitalopram (LEXAPRO) 20 MG tablet Take 20 mg by mouth at bedtime.    [provider]  escitalopram (LEXAPRO) 20 MG tablet Take 1 tablet (20 mg total) by mouth at bedtime. 09/05/15   Artelia Laroche, CNM  iron polysaccharides (NIFEREX) 150 MG capsule Take 1 capsule (150 mg total) by mouth daily. 09/05/15   Artelia Laroche, CNM  magnesium oxide (MAG-OX) 400 MG tablet Take 1 tablet (400 mg total) by mouth daily. Take with iron to PREVENT constipation 09/05/15   Artelia Laroche, CNM  Prenatal Vit-Fe Fumarate-FA (PRENATAL MULTIVITAMIN) TABS Take 1 tablet by mouth every evening.    [provider]    Family History Family History  Problem Relation Age of Onset  .  Diabetes Mother   . Cancer Mother        no sure of tupe, was on her shoulder blade  . Other Neg Hx     Social History Social History   Tobacco Use  . Smoking status: Former Smoker    Packs/day: 1.00    Years: 13.00    Pack years: 13.00    Types: Cigarettes    Last attempt to quit: 08/13/2011    Years since quitting: 5.6  . Smokeless tobacco: Never Used  Substance Use Topics  . Alcohol use: No  . Drug use: No     Allergies   Patient has no known allergies.   Review of Systems Review of Systems  Eyes: Positive for redness and itching.  All other systems reviewed and are negative.    Physical Exam Updated Vital Signs BP 137/84 (BP Location: Left Arm)   Pulse  70   Temp 98.8 F (37.1 C) (Oral)   Resp 18   Ht 5\' 7"  (1.702 m)   Wt 97.5 kg (215 lb)   LMP 03/29/2017   SpO2 100%   BMI 33.67 kg/m   Physical Exam  Constitutional: She appears well-developed and well-nourished. No distress.  Eyes: EOM are normal. Pupils are equal, round, and reactive to light. Lids are everted and swept, no foreign bodies found. Left eye exhibits no exudate and no hordeolum. No foreign body present in the left eye. Left conjunctiva is injected. Left eye exhibits normal extraocular motion and no nystagmus.  Fundoscopic exam:      The left eye shows no exudate.  Slit lamp exam:      The left eye shows corneal abrasion and fluorescein uptake. The left eye shows no corneal ulcer, no foreign body and no hyphema.  Neck: Neck supple.  Cardiovascular: Normal rate.  Pulmonary/Chest: Effort normal.  Musculoskeletal: Normal range of motion.  Neurological: She is alert.  Skin: Skin is warm and dry.  Psychiatric: She has a normal mood and affect.  Nursing note and vitals reviewed.    ED Treatments / Results  Labs (all labs ordered are listed, but only abnormal results are displayed) Labs Reviewed - No data to display  EKG Radiology No results found.  Procedures Procedures (including critical care time)  Medications Ordered in ED Medications  lidocaine-EPINEPHrine-tetracaine (LET) solution (3 mLs Topical Not Given 04/19/17 2007)  tetracaine (PONTOCAINE) 0.5 % ophthalmic solution 1 drop (not administered)  erythromycin ophthalmic ointment (not administered)  diphenhydrAMINE (BENADRYL) injection 50 mg (50 mg Intramuscular Given 04/19/17 2007)  fluorescein ophthalmic strip 1 strip (1 strip Left Eye Given 04/19/17 2007)     Initial Impression / Assessment and Plan / ED Course  I have reviewed the triage vital signs and the nursing notes. Corneal abrasion  Pt with corneal abrasion on PE. Up to date on tetanus. No evidence of FB.  No change in vision. Vision  checked with contact lens out of left eye.  Exam non-concerning for orbital cellulitis, hyphema, corneal ulcers. Patient will be discharged home with erythromycin.   Patient understands to follow up with ophthalmology, & to return to ER if new symptoms develop including change in vision, purulent drainage, or entrapment.   Final Clinical Impressions(s) / ED Diagnoses   Final diagnoses:  Abrasion of left cornea, initial encounter    ED Discharge Orders    None       Debroah Baller [place], [place] 04/19/17 2041    Gareth Morgan, MD 04/20/17 [id number]

## 2017-04-19 NOTE — Discharge Instructions (Signed)
Follow up with your eye doctor in 3 days or return here sooner for any problems.  Take benadryl as needed for itching and swelling.

## 2017-04-19 NOTE — ED Triage Notes (Signed)
Pt presents to the ED with c/o left eye problem. Pt reports that her left eye starting bothering her after rubbing her eye approx 30-45 minutes ago. Pt reports that since then, her eye has been watering and started to swell. Pt's eyeball is swollen internally.

## 2017-07-24 ENCOUNTER — Other Ambulatory Visit: Payer: Self-pay | Admitting: Radiology

## 2017-07-26 ENCOUNTER — Telehealth: Payer: Self-pay | Admitting: *Deleted

## 2017-07-26 DIAGNOSIS — C50912 Malignant neoplasm of unspecified site of left female breast: Secondary | ICD-10-CM

## 2017-07-26 HISTORY — DX: Malignant neoplasm of unspecified site of left female breast: C50.912

## 2017-07-26 NOTE — Telephone Encounter (Signed)
Confirmed [facility] for 07/31/17 at 815am .  Instructions and contact information given.

## 2017-07-29 ENCOUNTER — Other Ambulatory Visit: Payer: Self-pay | Admitting: *Deleted

## 2017-07-30 ENCOUNTER — Other Ambulatory Visit: Payer: Self-pay | Admitting: *Deleted

## 2017-07-30 DIAGNOSIS — C50212 Malignant neoplasm of upper-inner quadrant of left female breast: Secondary | ICD-10-CM | POA: Insufficient documentation

## 2017-07-30 DIAGNOSIS — Z170 Estrogen receptor positive status [ER+]: Principal | ICD-10-CM

## 2017-07-31 ENCOUNTER — Encounter: Payer: Self-pay | Admitting: Physical Therapy

## 2017-07-31 ENCOUNTER — Other Ambulatory Visit: Payer: Self-pay

## 2017-07-31 ENCOUNTER — Inpatient Hospital Stay: Payer: 59 | Attending: Hematology and Oncology | Admitting: Hematology and Oncology

## 2017-07-31 ENCOUNTER — Ambulatory Visit
Admission: RE | Admit: 2017-07-31 | Discharge: 2017-07-31 | Disposition: A | Discharge status: Home / Self Care | Payer: 59 | Source: Ambulatory Visit | Attending: Radiation Oncology | Admitting: Radiation Oncology

## 2017-07-31 ENCOUNTER — Ambulatory Visit: Payer: Self-pay | Admitting: General Surgery

## 2017-07-31 ENCOUNTER — Encounter: Payer: Self-pay | Admitting: Radiation Oncology

## 2017-07-31 ENCOUNTER — Encounter: Payer: Self-pay | Admitting: Hematology and Oncology

## 2017-07-31 ENCOUNTER — Encounter: Payer: Self-pay | Admitting: *Deleted

## 2017-07-31 ENCOUNTER — Ambulatory Visit: Payer: 59 | Attending: General Surgery | Admitting: Physical Therapy

## 2017-07-31 ENCOUNTER — Ambulatory Visit (HOSPITAL_BASED_OUTPATIENT_CLINIC_OR_DEPARTMENT_OTHER): Payer: 59 | Admitting: Genetic Counselor

## 2017-07-31 ENCOUNTER — Inpatient Hospital Stay: Payer: 59

## 2017-07-31 DIAGNOSIS — K8080 Other cholelithiasis without obstruction: Secondary | ICD-10-CM | POA: Diagnosis not present

## 2017-07-31 DIAGNOSIS — Z809 Family history of malignant neoplasm, unspecified: Secondary | ICD-10-CM | POA: Insufficient documentation

## 2017-07-31 DIAGNOSIS — Z800 Family history of malignant neoplasm of digestive organs: Secondary | ICD-10-CM

## 2017-07-31 DIAGNOSIS — R197 Diarrhea, unspecified: Secondary | ICD-10-CM | POA: Insufficient documentation

## 2017-07-31 DIAGNOSIS — Z331 Pregnant state, incidental: Secondary | ICD-10-CM | POA: Diagnosis not present

## 2017-07-31 DIAGNOSIS — Z803 Family history of malignant neoplasm of breast: Secondary | ICD-10-CM | POA: Diagnosis not present

## 2017-07-31 DIAGNOSIS — Z170 Estrogen receptor positive status [ER+]: Principal | ICD-10-CM

## 2017-07-31 DIAGNOSIS — Z79899 Other long term (current) drug therapy: Secondary | ICD-10-CM | POA: Diagnosis not present

## 2017-07-31 DIAGNOSIS — Z7183 Encounter for nonprocreative genetic counseling: Secondary | ICD-10-CM

## 2017-07-31 DIAGNOSIS — F419 Anxiety disorder, unspecified: Secondary | ICD-10-CM | POA: Diagnosis not present

## 2017-07-31 DIAGNOSIS — R293 Abnormal posture: Secondary | ICD-10-CM | POA: Insufficient documentation

## 2017-07-31 DIAGNOSIS — C50212 Malignant neoplasm of upper-inner quadrant of left female breast: Secondary | ICD-10-CM

## 2017-07-31 DIAGNOSIS — C50912 Malignant neoplasm of unspecified site of left female breast: Secondary | ICD-10-CM

## 2017-07-31 DIAGNOSIS — Z87891 Personal history of nicotine dependence: Secondary | ICD-10-CM | POA: Diagnosis not present

## 2017-07-31 LAB — CBC WITH DIFFERENTIAL (CANCER CENTER ONLY)
BASOS ABS: 0.1 10*3/uL (ref 0.0–0.1)
Basophils Relative: 1 %
EOS PCT: 2 %
Eosinophils Absolute: 0.1 10*3/uL (ref 0.0–0.5)
HCT: 36.6 % (ref 34.8–46.6)
Hemoglobin: 12.4 g/dL (ref 11.6–15.9)
LYMPHS PCT: 30 %
Lymphs Abs: 1.8 10*3/uL (ref 0.9–3.3)
MCH: 29.7 pg (ref 25.1–34.0)
MCHC: 34 g/dL (ref 31.5–36.0)
MCV: 87.4 fL (ref 79.5–101.0)
MONO ABS: 0.3 10*3/uL (ref 0.1–0.9)
Monocytes Relative: 6 %
Neutro Abs: 3.7 10*3/uL (ref 1.5–6.5)
Neutrophils Relative %: 61 %
PLATELETS: 226 10*3/uL (ref 145–400)
RBC: 4.19 MIL/uL (ref 3.70–5.45)
RDW: 13.3 % (ref 11.2–14.5)
WBC Count: 6 10*3/uL (ref 3.9–10.3)

## 2017-07-31 LAB — CMP (CANCER CENTER ONLY)
ALBUMIN: 4 g/dL (ref 3.5–5.0)
ALT: 14 U/L (ref 0–55)
AST: 14 U/L (ref 5–34)
Alkaline Phosphatase: 79 U/L (ref 40–150)
Anion gap: 6 (ref 3–11)
BILIRUBIN TOTAL: 0.4 mg/dL (ref 0.2–1.2)
BUN: 19 mg/dL (ref 7–26)
CALCIUM: 9.2 mg/dL (ref 8.4–10.4)
CO2: 27 mmol/L (ref 22–29)
CREATININE: 0.85 mg/dL (ref 0.60–1.10)
Chloride: 106 mmol/L (ref 98–109)
GFR, Est AFR Am: >60 mL/min (ref >60)
GFR, Estimated: >60 mL/min (ref >60)
GLUCOSE: 74 mg/dL (ref 70–140)
Potassium: 4 mmol/L (ref 3.5–5.1)
Sodium: 139 mmol/L (ref 136–145)
TOTAL PROTEIN: 7 g/dL (ref 6.4–8.3)

## 2017-07-31 NOTE — Progress Notes (Signed)
Nutrition Assessment  Reason for Assessment:  Pt seen in Breast Clinic  ASSESSMENT:   37 year old female with new diagnosis of breast cancer.  Past medical history reviewed.    Patient reports normal appetite.   Medications:  reviewed  Labs: reviewed  Anthropometrics:   Height: 67 inches Weight: 222 lb 3.2 oz BMI: 34   NUTRITION DIAGNOSIS: Food and nutrition related knowledge deficit related to new diagnosis of breast cancer as evidenced by no prior need for nutrition related information.  INTERVENTION:   Discussed and provided packet of information regarding nutritional tips for breast cancer patients.  Questions answered.  Teachback method used.  Contact information provided and patient knows to contact me with questions/concerns.    MONITORING, EVALUATION, and GOAL: Pt will consume a healthy plant based diet to maintain lean body mass throughout treatment.   Kahmari Koller B. Zenia Resides, [place], [place] Registered Dietitian [phone] (pager)

## 2017-07-31 NOTE — Progress Notes (Signed)
[facility] CONSULT NOTE  Patient Care Team: Brien Few, MD as PCP - General (Obstetrics and Gynecology)  CHIEF COMPLAINTS/PURPOSE OF CONSULTATION:  Newly diagnosed breast cancer  HISTORY OF PRESENTING ILLNESS:  [name] 37 y.o. female is here because of recent diagnosis of left breast cancer.  She felt a lump in the left breast in the upper inner quadrant.  She saw her primary care physician obtained an immediate mammogram and ultrasound.  The ultrasound revealed a ill-defined 1.6 cm mass.  This was biopsied by ultrasound.  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  She was presented this morning in the multidisciplinary tumor board and she is here today accompanied by her husband and her mom to discuss treatment options.  I reviewed her records extensively and collaborated the history with the patient.  SUMMARY OF ONCOLOGIC HISTORY:   Malignant neoplasm of upper-inner quadrant of left breast in female, estrogen receptor positive ([place])   07/24/2017 Initial Diagnosis    Left breast palpable lump upper inner quadrant 11 o'clock position: Ill-defined 1.6 cm mass, axilla ultrasound negative, ultrasound-guided biopsy: Grade 2 IDC ER 90%, PR 40%, Ki-67 15%, HER-2 negative      MEDICAL HISTORY:  Past Medical History:  Diagnosis Date  . Abdominal pain    r/t gallstones  . Anxiety   . Diarrhea    history - diarrhea since gall bladder attacks per pt  . Fever blister    history  . Gallstones   . Gestational diabetes    diet controlled  . Gestational diabetes mellitus (GDM), antepartum   . Hemorrhoids   . Hx of varicella   . Nausea & vomiting    history    SURGICAL HISTORY: Past Surgical History:  Procedure Laterality Date  . CESAREAN SECTION  03/15/2012   Procedure: CESAREAN SECTION;  Surgeon: Lovenia Kim, MD;  Location: [facility];  Service: Obstetrics;  Laterality: N/A;  Primary cesarean  section with delivery of baby girl at 55. Apgars 4/7.  Marland Kitchen CESAREAN SECTION WITH BILATERAL TUBAL LIGATION Bilateral 09/03/2015   Procedure: REPEAT CESAREAN SECTION WITH BILATERAL TUBAL LIGATION;  Surgeon: Brien Few, MD;  Location: [facility];  Service: Obstetrics;  Laterality: Bilateral;  EDD: 09/10/15  . CHOLECYSTECTOMY  12/07/2011   Procedure: LAPAROSCOPIC CHOLECYSTECTOMY;  Surgeon: Harl Bowie, MD;  Location: [facility];  Service: General;  Laterality: N/A;  . WISDOM TOOTH EXTRACTION  2002    SOCIAL HISTORY: Social History   Socioeconomic History  . Marital status: Married    Spouse name: Not on file  . Number of children: Not on file  . Years of education: Not on file  . Highest education level: Not on file  Social Needs  . Financial resource strain: Not on file  . Food insecurity - worry: Not on file  . Food insecurity - inability: Not on file  . Transportation needs - medical: Not on file  . Transportation needs - non-medical: Not on file  Occupational History  . Not on file  Tobacco Use  . Smoking status: Former Smoker    Packs/day: 1.00    Years: 13.00    Pack years: 13.00    Types: Cigarettes    Last attempt to quit: 08/13/2011    Years since quitting: 5.9  . Smokeless tobacco: Never Used  Substance and Sexual Activity  . Alcohol use: No  . Drug use: No  . Sexual activity: Yes  Birth control/protection: None    Comment: currently [redacted] wks pregnant  Other Topics Concern  . Not on file  Social History Narrative  . Not on file    FAMILY HISTORY: Family History  Problem Relation Age of Onset  . Diabetes Mother   . Cancer Mother        no sure of tupe, was on her shoulder blade  . Breast cancer Other        Maternal Great Aunt  . Breast cancer Other        Maternal Great Aunt  . Other Neg Hx     ALLERGIES:  has No Known Allergies.  MEDICATIONS:  Current Outpatient Medications  Medication Sig Dispense Refill  . escitalopram (LEXAPRO) 20 MG tablet Take  20 mg by mouth at bedtime.     No current facility-administered medications for this visit.     REVIEW OF SYSTEMS:   Constitutional: Denies fevers, chills or abnormal night sweats Eyes: Denies blurriness of vision, double vision or watery eyes Ears, nose, mouth, throat, and face: Denies mucositis or sore throat Respiratory: Denies cough, dyspnea or wheezes Cardiovascular: Denies palpitation, chest discomfort or lower extremity swelling Gastrointestinal:  Denies nausea, heartburn or change in bowel habits Skin: Denies abnormal skin rashes Lymphatics: Denies new lymphadenopathy or easy bruising Neurological:Denies numbness, tingling or new weaknesses Behavioral/Psych: Mood is stable, no new changes  Breast: Palpable lump in the left breast All other systems were reviewed with the patient and are negative.  PHYSICAL EXAMINATION: ECOG PERFORMANCE STATUS: 1 - Symptomatic but completely ambulatory  Vitals:   07/31/17 0908  BP: (!) 141/77  Pulse: 78  Resp: 20  Temp: 98.4 F (36.9 C)  SpO2: 100%   Filed Weights   07/31/17 0908  Weight: 222 lb 3.2 oz (100.8 kg)    GENERAL:alert, no distress and comfortable SKIN: skin color, texture, turgor are normal, no rashes or significant lesions EYES: normal, conjunctiva are pink and non-injected, sclera clear OROPHARYNX:no exudate, no erythema and lips, buccal mucosa, and tongue normal  NECK: supple, thyroid normal size, non-tender, without nodularity LYMPH:  no palpable lymphadenopathy in the cervical, axillary or inguinal LUNGS: clear to auscultation and percussion with normal breathing effort HEART: regular rate & rhythm and no murmurs and no lower extremity edema ABDOMEN:abdomen soft, non-tender and normal bowel sounds Musculoskeletal:no cyanosis of digits and no clubbing  PSYCH: alert & oriented x 3 with fluent speech NEURO: no focal motor/sensory deficits BREAST: Palpable lump in the left breast. No palpable axillary or  supraclavicular lymphadenopathy (exam performed in the presence of a chaperone)   LABORATORY DATA:  I have reviewed the data as listed Lab Results  Component Value Date   WBC 6.0 07/31/2017   HGB 8.8 (L) 09/04/2015   HCT 36.6 07/31/2017   MCV 87.4 07/31/2017   PLT 226 07/31/2017   Lab Results  Component Value Date   NA 139 07/31/2017   K 4.0 07/31/2017   CL 106 07/31/2017   CO2 27 07/31/2017    RADIOGRAPHIC STUDIES: I have personally reviewed the radiological reports and agreed with the findings in the report.  ASSESSMENT AND PLAN:  Malignant neoplasm of upper-inner quadrant of left breast in female, estrogen receptor positive ([place]) 07/24/2017: Left breast palpable lump upper inner quadrant 11 o'clock position: Ill-defined 1.6 cm mass, axilla ultrasound negative, ultrasound-guided biopsy: Grade 2 IDC ER 90%, PR 40%, Ki-67 15%, HER-2 negative  Pathology and radiology counseling:Discussed with the patient, the details of pathology including the type  of breast cancer,the clinical staging, the significance of ER, PR and HER-2/neu receptors and the implications for treatment. After reviewing the pathology in detail, we proceeded to discuss the different treatment options between surgery, radiation, chemotherapy, antiestrogen therapies.  Recommendations: 1. Breast conserving surgery followed by 2. Mammaprint testing to determine if chemotherapy would be of any benefit followed by 3. Adjuvant radiation therapy followed by 4. Adjuvant antiestrogen therapy with tamoxifen 20 mg daily versus ovarian suppression with letrozole patient is very worried about mood swings from tamoxifen.)  Mammaprint counseling: MINDACT is a prospective, randomized phase III controlled trial that investigates the clinical utility of MammaPrint, when compared to standard clinical pathological criteria, with 6,693 patients enrolled from over 111 institutions. Clinical high-risk patients with a Low Risk MammaPrint  result, including 48% node-positive, had 5-year distant metastasis-free survival rate in excess of 94 percent, whether randomized to receive adjuvant chemotherapy or not proving MammaPrint's ability to safely identify Low Risk patients.  Return to clinic after surgery to discuss final pathology report and then determine if Mammaprint testing will need to be sent.  All questions were answered. The patient knows to call the clinic with any problems, questions or concerns.    Harriette Ohara, MD 07/31/17

## 2017-07-31 NOTE — Progress Notes (Signed)
Clinical Social Work [facility] Psychosocial Distress Screening [place]  Patient completed distress screening protocol and scored a 7 on the Psychosocial Distress Thermometer which indicates moderate distress. Clinical Social Worker met with patient and patients family in [facility] to assess for distress and other psychosocial needs. Patient stated she was feeling overwhelmed but felt "better" after meeting with the treatment team and getting more information on her treatment plan. CSW and patient discussed common feeling and emotions when being diagnosed with cancer, and the importance of support during treatment.  CSW and patient discussed "talking" resources for her 2 young daughters, and patient knows to contact CSW if she would like additional information.  CSW informed patient of the support team and support services at [facility], and patient was agreeable to an [employer] referral. CSW provided contact information and encouraged patient to call with any questions or concerns.  ONCBCN DISTRESS SCREENING 07/31/2017  Screening Type Initial Screening  Distress experienced in past week (1-10) 7  Emotional problem type Nervousness/Anxiety;Adjusting to illness;Isolation/feeling alone  Spiritual/Religous concerns type Facing my mortality  Information Concerns Type Lack of info about diagnosis;Lack of info about treatment  Referral to support programs Yes     Johnnye Lana, MSW, LCSW, OSW-C Clinical Social Worker [facility] [phone]

## 2017-07-31 NOTE — Progress Notes (Signed)
Radiation Oncology         [phone] ________________________________  Name: [name]        MRN: [id number]  Date of Service: 07/31/2017 DOB: September 09, 1980  [id number], Delfino Lovett, MD  Excell Seltzer, MD     REFERRING PHYSICIAN: Excell Seltzer, MD   DIAGNOSIS: The encounter diagnosis was Malignant neoplasm of upper-inner quadrant of left breast in female, estrogen receptor positive ([place]).   HISTORY OF PRESENT ILLNESS: [name] is a 37 y.o. female seen in the multidisciplinary breast clinic for a new diagnosis of left breast cancer. The patient was noted to have a palpable mass in the left breast. She proceeded with diagnostic imaging and this revealed an ill defined mass at 11:00. Her axilla was negative for adenopathy. A biopsy on 07/24/17 revealed a grade 2, invasive ductal carcinoma, ER/PR, HER2 negative, Ki 67 of 15%. She comes today to discuss options of treatment for her cancer.   PREVIOUS RADIATION THERAPY: No   PAST MEDICAL HISTORY:  Past Medical History:  Diagnosis Date  . Abdominal pain    r/t gallstones  . Anxiety   . Diarrhea    history - diarrhea since gall bladder attacks per pt  . Fever blister    history  . Gallstones   . Gestational diabetes    diet controlled  . Gestational diabetes mellitus (GDM), antepartum   . Hemorrhoids   . Hx of varicella   . Nausea & vomiting    history       PAST SURGICAL HISTORY: Past Surgical History:  Procedure Laterality Date  . CESAREAN SECTION  03/15/2012   Procedure: CESAREAN SECTION;  Surgeon: Lovenia Kim, MD;  Location: [facility];  Service: Obstetrics;  Laterality: N/A;  Primary cesarean section with delivery of baby girl at 62. Apgars 4/7.  Marland Kitchen CESAREAN SECTION WITH BILATERAL TUBAL LIGATION Bilateral 09/03/2015   Procedure: REPEAT CESAREAN SECTION WITH BILATERAL TUBAL LIGATION;  Surgeon: Brien Few, MD;  Location: [facility];  Service: Obstetrics;  Laterality: Bilateral;  EDD: 09/10/15  .  CHOLECYSTECTOMY  12/07/2011   Procedure: LAPAROSCOPIC CHOLECYSTECTOMY;  Surgeon: Harl Bowie, MD;  Location: [facility];  Service: General;  Laterality: N/A;  . WISDOM TOOTH EXTRACTION  2002     FAMILY HISTORY:  Family History  Problem Relation Age of Onset  . Diabetes Mother   . Cancer Mother        no sure of tupe, was on her shoulder blade  . Other Neg Hx      SOCIAL HISTORY:  reports that she quit smoking about 5 years ago. Her smoking use included cigarettes. She has a 13.00 pack-year smoking history. she has never used smokeless tobacco. She reports that she does not drink alcohol or use drugs. The patient is married and accompanied by her husband and mother. She lives in [place], and she works in an insurance setting. She has two daughters.   ALLERGIES: Patient has no known allergies.   MEDICATIONS:  Current Outpatient Medications  Medication Sig Dispense Refill  . escitalopram (LEXAPRO) 20 MG tablet Take 20 mg by mouth at bedtime.     No current facility-administered medications for this encounter.      REVIEW OF SYSTEMS: On review of systems, the patient reports that she is doing well overall. She denies any chest pain, shortness of breath, cough, fevers, chills, night sweats, unintended weight changes. She denies any bowel or bladder disturbances, and denies abdominal pain, nausea or vomiting. She denies any  new musculoskeletal or joint aches or pains. A complete review of systems is obtained and is otherwise negative.     PHYSICAL EXAM:  Wt Readings from Last 3 Encounters:  07/31/17 222 lb 3.2 oz (100.8 kg)  04/19/17 215 lb (97.5 kg)  08/29/15 232 lb (105.2 kg)   Temp Readings from Last 3 Encounters:  07/31/17 98.4 F (36.9 C) (Oral)  04/19/17 98.8 F (37.1 C) (Oral)  09/05/15 98.4 F (36.9 C) (Oral)   BP Readings from Last 3 Encounters:  07/31/17 (!) 141/77  04/19/17 137/84  09/05/15 111/71   Pulse Readings from Last 3 Encounters:  07/31/17 78    04/19/17 70  09/05/15 81     In general this is a well appearing caucasian female in no acute distress. She is alert and oriented x4 and appropriate throughout the examination. HEENT reveals that the patient is normocephalic, atraumatic. EOMs are intact. PERRLA. Skin is intact without any evidence of gross lesions. Cardiovascular exam reveals a regular rate and rhythm, no clicks rubs or murmurs are auscultated. Chest is clear to auscultation bilaterally. Lymphatic assessment is performed and does not reveal any adenopathy in the cervical, supraclavicular, axillary, or inguinal chains. Bilateral breast exam is performed and reveals ecchymosis of the left breast biopsy site and no palpable mass in the right breast. The left does have some fullness deep to the biopsy site. No nipple bleeding or discharge is noted.. Abdomen has active bowel sounds in all quadrants and is intact. The abdomen is soft, non tender, non distended. Lower extremities are negative for pretibial pitting edema, deep calf tenderness, cyanosis or clubbing.   ECOG = 0  0 - Asymptomatic (Fully active, able to carry on all predisease activities without restriction)  1 - Symptomatic but completely ambulatory (Restricted in physically strenuous activity but ambulatory and able to carry out work of a light or sedentary nature. For example, light housework, office work)  2 - Symptomatic, <50% in bed during the day (Ambulatory and capable of all self care but unable to carry out any work activities. Up and about more than 50% of waking hours)  3 - Symptomatic, >50% in bed, but not bedbound (Capable of only limited self-care, confined to bed or chair 50% or more of waking hours)  4 - Bedbound (Completely disabled. Cannot carry on any self-care. Totally confined to bed or chair)  5 - Death   Eustace Pen MM, Creech RH, Tormey DC, et al. [id number]). "Toxicity and response criteria of the [facility] Group". [place] Oncol. 5  (6): 649-55    LABORATORY DATA:  Lab Results  Component Value Date   WBC 6.0 07/31/2017   HGB 8.8 (L) 09/04/2015   HCT 36.6 07/31/2017   MCV 87.4 07/31/2017   PLT 226 07/31/2017   Lab Results  Component Value Date   NA 139 07/31/2017   K 4.0 07/31/2017   CL 106 07/31/2017   CO2 27 07/31/2017   Lab Results  Component Value Date   ALT 14 07/31/2017   AST 14 07/31/2017   ALKPHOS 79 07/31/2017   BILITOT 0.4 07/31/2017      RADIOGRAPHY: No results found.     IMPRESSION/PLAN: 1. Stage IA, [id number], grade 2, ER/PR positive invasive ductal carcinoma of the left breast. Dr. Lisbeth Renshaw discusses the pathology findings and reviews the nature of invasive breast disease. The consensus from the breast conference include breast conservation with lumpectomy with sentinel mapping versus surgical planning that could depend on her genetic  testing results. Dr. Lindi Adie has recommended proceeding with oncotype dx score to determine a role for systemic therapy. Provided that chemotherapy is not indicated, the patient's course would then be followed by external radiotherapy to the breast followed by antiestrogen therapy again if she proceed with breast conservation. We discussed the risks, benefits, short, and long term effects of radiotherapy, and the patient is interested in proceeding. Dr. Lisbeth Renshaw discusses the delivery and logistics of radiotherapy and anticipates a course of 6 1/2 weeks with deep inspiration breath hold technique. We will see her back about 2 weeks after surgery if she has lumpectomy to move forward with the simulation and planning process and anticipate starting radiotherapy about 4 weeks after surgery.  2. Possible genetic predisposition to malignancy. The patient is eligible for genetic testing and will be referred to discuss testing. This may change her surgical approach for her cancer as well.  3. Contraception and ovarian supression. The patient is counseled on the importance to avoid  pregnancy during radiotherapy treatments. We will review this at the appropriate time. Dr. Lindi Adie will also review this with her.  The above documentation reflects my direct findings during this shared patient visit. Please see the separate note by Dr. Lisbeth Renshaw on this date for the remainder of the patient's plan of care.    Carola Rhine, PAC

## 2017-07-31 NOTE — Assessment & Plan Note (Signed)
07/24/2017: Left breast palpable lump upper inner quadrant 11 o'clock position: Ill-defined 1.6 cm mass, axilla ultrasound negative, ultrasound-guided biopsy: Grade 2 IDC ER 90%, PR 40%, Ki-67 15%, HER-2 negative  Pathology and radiology counseling:Discussed with the patient, the details of pathology including the type of breast cancer,the clinical staging, the significance of ER, PR and HER-2/neu receptors and the implications for treatment. After reviewing the pathology in detail, we proceeded to discuss the different treatment options between surgery, radiation, chemotherapy, antiestrogen therapies.  Recommendations: 1. Breast conserving surgery followed by 2. Mammaprint testing to determine if chemotherapy would be of any benefit followed by 3. Adjuvant radiation therapy followed by 4. Adjuvant antiestrogen therapy with tamoxifen 20 mg daily versus ovarian suppression with letrozole patient is very worried about mood swings from tamoxifen.)  Oncotype counseling: I discussed Oncotype DX test. I explained to the patient that this is a 21 gene panel to evaluate patient tumors DNA to calculate recurrence score. This would help determine whether patient has high risk or intermediate risk or low risk breast cancer. She understands that if her tumor was found to be high risk, she would benefit from systemic chemotherapy. If low risk, no need of chemotherapy. If she was found to be intermediate risk, we would need to evaluate the score as well as other risk factors and determine if an abbreviated chemotherapy may be of benefit.  Return to clinic after surgery to discuss final pathology report and then determine if Oncotype DX testing will need to be sent.

## 2017-07-31 NOTE — Patient Instructions (Signed)

## 2017-07-31 NOTE — Therapy (Signed)
[place], [place], [address] Phone: [phone]   Fax:  [phone]  Physical Therapy Evaluation  Patient Details  Name: [name] MRN: [id number] Date of Birth: 30-Oct-1980 Referring Provider: Dr. Excell Seltzer   Encounter Date: 07/31/2017  PT End of Session - 07/31/17 1050    Visit Number  1    Number of Visits  2    Date for PT Re-Evaluation  09/25/17    PT Start Time  1020    PT Stop Time  6962 Also saw pt from 9528 to 1114 and 1148-1154 for a total of 31 minutes    PT Time Calculation (min)  14 min    Activity Tolerance  Patient tolerated treatment well    Behavior During Therapy  [facility] for tasks assessed/performed       Past Medical History:  Diagnosis Date  . Abdominal pain    r/t gallstones  . Anxiety   . Diarrhea    history - diarrhea since gall bladder attacks per pt  . Fever blister    history  . Gallstones   . Gestational diabetes    diet controlled  . Gestational diabetes mellitus (GDM), antepartum   . Hemorrhoids   . Hx of varicella   . Nausea & vomiting    history    Past Surgical History:  Procedure Laterality Date  . CESAREAN SECTION  03/15/2012   Procedure: CESAREAN SECTION;  Surgeon: Lovenia Kim, MD;  Location: [facility];  Service: Obstetrics;  Laterality: N/A;  Primary cesarean section with delivery of baby girl at 14. Apgars 4/7.  Marland Kitchen CESAREAN SECTION WITH BILATERAL TUBAL LIGATION Bilateral 09/03/2015   Procedure: REPEAT CESAREAN SECTION WITH BILATERAL TUBAL LIGATION;  Surgeon: Brien Few, MD;  Location: [facility];  Service: Obstetrics;  Laterality: Bilateral;  EDD: 09/10/15  . CHOLECYSTECTOMY  12/07/2011   Procedure: LAPAROSCOPIC CHOLECYSTECTOMY;  Surgeon: Harl Bowie, MD;  Location: [facility];  Service: General;  Laterality: N/A;  . WISDOM TOOTH EXTRACTION  2002    There were no vitals filed for this visit.   Subjective Assessment - 07/31/17 1040     Subjective  Patient reports she is here today to be seen by her medical team for her newly diagnosed left breast cancer.    Patient is accompained by:  Family member    Pertinent History  Patient was diagnosed 07/23/17 with left grade 2 invasive ductal carcinoma breast cancer. It measures 1.6 cm and is located in the upper inner quadrant. It is ER/PR positive and HEr2 negative with a ki67 of 15%.    Patient Stated Goals  reduce lymphedema risk and learn post op shoulder ROM HEP    Currently in Pain?  No/denies         [facility] PT Assessment - 07/31/17 0001      Assessment   Medical Diagnosis  Left breast cancer    Referring Provider  Dr. Excell Seltzer    Onset Date/Surgical Date  07/23/17    Hand Dominance  Right    Prior Therapy  none      Precautions   Precautions  Other (comment)    Precaution Comments  Active cancer      Restrictions   Weight Bearing Restrictions  No      Balance Screen   Has the patient fallen in the past 6 months  No    Has the patient had a decrease in activity level because of a  fear of falling?   No    Is the patient reluctant to leave their home because of a fear of falling?   No      Home Environment   Living Environment  Private residence    Living Arrangements  Spouse/significant other;Children Husband, 5 and 2 y.o.    Available Help at Discharge  Family      Prior Function   Level of Independence  Independent    Vocation  Full time employment    Counsellor; travels    Leisure  She walks or does the elliptical 30 min/day      Cognition   Overall Cognitive Status  Within Functional Limits for tasks assessed      Posture/Postural Control   Posture/Postural Control  Postural limitations    Postural Limitations  Rounded Shoulders;Forward head      ROM / Strength   AROM / PROM / Strength  AROM;Strength      AROM   AROM Assessment Site  Shoulder;Cervical    Right/Left Shoulder  Left;Right    Right  Shoulder Extension  60 Degrees    Right Shoulder Flexion  155 Degrees    Right Shoulder ABduction  167 Degrees    Right Shoulder Internal Rotation  79 Degrees    Right Shoulder External Rotation  76 Degrees    Left Shoulder Extension  48 Degrees    Left Shoulder Flexion  160 Degrees    Left Shoulder ABduction  159 Degrees    Left Shoulder Internal Rotation  60 Degrees    Left Shoulder External Rotation  79 Degrees    Cervical Flexion  WNL    Cervical Extension  WNL    Cervical - Right Side Bend  WNL    Cervical - Left Side Bend  WNL    Cervical - Right Rotation  WNL    Cervical - Left Rotation  WNL      Strength   Overall Strength  Within functional limits for tasks performed        LYMPHEDEMA/ONCOLOGY QUESTIONNAIRE - 07/31/17 1047      Type   Cancer Type  Left breast       Lymphedema Assessments   Lymphedema Assessments  Upper extremities      Right Upper Extremity Lymphedema   10 cm Proximal to Olecranon Process  34 cm    Olecranon Process  27.2 cm    10 cm Proximal to Ulnar Styloid Process  25.2 cm    Just Proximal to Ulnar Styloid Process  17.8 cm    Across Hand at [employer]  19.8 cm    At [place] of 2nd Digit  6.5 cm      Left Upper Extremity Lymphedema   10 cm Proximal to Olecranon Process  34.4 cm    Olecranon Process  27.5 cm    10 cm Proximal to Ulnar Styloid Process  24.8 cm    Just Proximal to Ulnar Styloid Process  17.1 cm    Across Hand at [employer]  20.1 cm    At [place] of 2nd Digit  6.5 cm          Objective measurements completed on examination: See above findings.      Patient was instructed today in a home exercise program today for post op shoulder range of motion. These included active assist shoulder flexion in sitting, scapular retraction, wall walking with shoulder abduction, and hands behind head external rotation.  She was encouraged to do these twice a day, holding 3 seconds and repeating 5 times when permitted by her  physician.       PT Education - 07/31/17 1048    Education provided  Yes    Education Details  Lymphedema risk reduction and post op shoulder ROM HEP    Person(s) Educated  Patient;Spouse    Methods  Explanation;Demonstration;Handout    Comprehension  Returned demonstration;Verbalized understanding          PT Long Term Goals - 07/31/17 1058      PT LONG TERM GOAL #1   Title  Patient will be able to demonstrate she has regained shoulder ROM and function post operatively.    Time  8    Period  Weeks    Status  New      Breast Clinic Goals - 07/31/17 1058      Patient will be able to verbalize understanding of pertinent lymphedema risk reduction practices relevant to her diagnosis specifically related to skin care.   Time  1    Period  Days    Status  Achieved      Patient will be able to return demonstrate and/or verbalize understanding of the post-op home exercise program related to regaining shoulder range of motion.   Time  1    Period  Days    Status  Achieved      Patient will be able to verbalize understanding of the importance of attending the postoperative After Breast Cancer Class for further lymphedema risk reduction education and therapeutic exercise.   Time  1    Period  Days    Status  Achieved            Plan - 07/31/17 1051    Clinical Impression Statement  Patient was diagnosed 07/23/17 with left grade 2 invasive ductal carcinoma breast cancer. It measures 1.6 cm and is located in the upper inner quadrant. It is ER/PR positive and HEr2 negative with a ki67 of 15%. Her multidisciplinary medical team met prior to her assessments to determine a recommended treatment plan. She is planning to have a left lumpectomy and sentinel node biopsy followed by Oncotype testing, radiation, and anti-estrogen therapy. She will benefit from post op PT to reassess and determine PT needs.    History and Personal Factors relevant to plan of care:  None    Clinical  Presentation  Stable    Clinical Decision Making  Low    Rehab Potential  Excellent    Clinical Impairments Affecting Rehab Potential  None    PT Frequency  -- Eval and 1 f/u visit    PT Treatment/Interventions  ADLs/Self Care Home Management;Therapeutic exercise;Patient/family education    PT Next Visit Plan  Will f/u after surgery to determine PT needs    PT Home Exercise Plan  Post op shoulder ROM HEP    Consulted and Agree with Plan of Care  Patient;Family member/caregiver    Family Member Consulted  Husband and mother       Patient will benefit from skilled therapeutic intervention in order to improve the following deficits and impairments:  Decreased knowledge of precautions, Impaired UE functional use, Decreased range of motion, Postural dysfunction, Pain  Visit Diagnosis: Malignant neoplasm of upper-inner quadrant of left breast in female, estrogen receptor positive ([place]) - Plan: PT plan of care cert/re-cert  Abnormal posture - Plan: PT plan of care cert/re-cert   Patient will follow up at outpatient cancer rehab  3-4 weeks following surgery.  If the patient requires physical therapy at that time, a specific plan will be dictated and sent to the referring physician for approval. The patient was educated today on appropriate basic range of motion exercises to begin post operatively and the importance of attending the After Breast Cancer class following surgery.  Patient was educated today on lymphedema risk reduction practices as it pertains to recommendations that will benefit the patient immediately following surgery.  She verbalized good understanding.      Problem List Patient Active Problem List   Diagnosis Date Noted  . Malignant neoplasm of upper-inner quadrant of left breast in female, estrogen receptor positive ([place]) 07/30/2017  . Previous cesarean section 09/05/2015  . Postpartum care following cesarean delivery with btl (4/8) 09/04/2015  . Symptomatic cholelithiasis  11/20/2011   Annia Friendly, PT 07/31/17 12:33 PM  [place], [place], [address] Phone: [phone]   Fax:  [phone]  Name: [name] MRN: [id number] Date of Birth: [birth year]

## 2017-08-01 ENCOUNTER — Encounter: Payer: Self-pay | Admitting: Genetic Counselor

## 2017-08-01 ENCOUNTER — Encounter: Payer: Self-pay | Admitting: General Practice

## 2017-08-01 DIAGNOSIS — Z809 Family history of malignant neoplasm, unspecified: Secondary | ICD-10-CM | POA: Insufficient documentation

## 2017-08-01 DIAGNOSIS — Z800 Family history of malignant neoplasm of digestive organs: Secondary | ICD-10-CM | POA: Insufficient documentation

## 2017-08-01 DIAGNOSIS — Z803 Family history of malignant neoplasm of breast: Secondary | ICD-10-CM | POA: Insufficient documentation

## 2017-08-01 NOTE — Progress Notes (Signed)
[place] CSW Progress Note  Call from husband Rekisha Welling [phone]) re FMLA paperwork given to CSW [place] at clinic 3/6.  Wanted status report. Per CSW Dalene Seltzer, nurse navigator took care of FMLA paperwork and forwarded to patient's surgeon's office for completion.  VM left for husband, nurse navigator also advised of husbands question via staff message.  Edwyna Shell, LCSW Clinical Social Worker Phone:  [phone]

## 2017-08-01 NOTE — Progress Notes (Signed)
REFERRING PROVIDER: Rolm Bookbinder, MD [address], [place] [address]  PRIMARY PROVIDER:  Brien Few, MD  PRIMARY REASON FOR VISIT:  1. Malignant neoplasm of upper-inner quadrant of left breast in female, estrogen receptor positive ([name])   2. Family history of breast cancer   3. Family history of colon cancer   4. Family history of cancer      HISTORY OF PRESENT ILLNESS:   [name], a 37 y.o. female, was seen for a [facility] cancer genetics consultation at the request of Dr. Donne [name] due to a personal and family history of cancer.  [name] presents to clinic today to discuss the possibility of a hereditary predisposition to cancer, genetic testing, and to further clarify her future cancer risks, as well as potential cancer risks for family members.   In 2019, at the age of 23, [name] was diagnosed with Invasive ductal carcinoma of the left breast. The tumor is ER+/PR+/Her2-. This is anticipated to be treated with either a lumpectomy, radiation and tamoxifen, or a mastectomy and tamoxifen. Chemotherapy will be decided after the mammoprint testing is complete.      CANCER HISTORY:    Malignant neoplasm of upper-inner quadrant of left breast in female, estrogen receptor positive ([place])   07/24/2017 Initial Diagnosis    Left breast palpable lump upper inner quadrant 11 o'clock position: Ill-defined 1.6 cm mass, axilla ultrasound negative, ultrasound-guided biopsy: Grade 2 IDC ER 90%, PR 40%, Ki-67 15%, HER-2 negative        HORMONAL RISK FACTORS:  Menarche was at age 60-14.  First live birth at age 75.  OCP use for approximately 14 years.  Ovaries intact: yes.  Hysterectomy: no.  Menopausal status: premenopausal.  HRT use: 0 years. Colonoscopy: no; not examined. Mammogram within the last year: yes. Number of breast biopsies: 1. Up to date with pelvic exams:  yes. Any excessive radiation exposure in the past:  no  Past Medical History:   Diagnosis Date  . Abdominal pain    r/t gallstones  . Anxiety   . Diarrhea    history - diarrhea since gall bladder attacks per pt  . Family history of breast cancer   . Family history of cancer   . Family history of colon cancer   . Fever blister    history  . Gallstones   . Gestational diabetes    diet controlled  . Gestational diabetes mellitus (GDM), antepartum   . Hemorrhoids   . Hx of varicella   . Nausea & vomiting    history    Past Surgical History:  Procedure Laterality Date  . CESAREAN SECTION  03/15/2012   Procedure: CESAREAN SECTION;  Surgeon: Sabrina Kim, MD;  Location: [facility];  Service: Obstetrics;  Laterality: N/A;  Primary cesarean section with delivery of baby girl at 58. Apgars 4/7.  Marland Kitchen CESAREAN SECTION WITH BILATERAL TUBAL LIGATION Bilateral 09/03/2015   Procedure: REPEAT CESAREAN SECTION WITH BILATERAL TUBAL LIGATION;  Surgeon: Sabrina Few, MD;  Location: [facility];  Service: Obstetrics;  Laterality: Bilateral;  EDD: 09/10/15  . CHOLECYSTECTOMY  12/07/2011   Procedure: LAPAROSCOPIC CHOLECYSTECTOMY;  Surgeon: Sabrina Bowie, MD;  Location: [facility];  Service: General;  Laterality: N/A;  . WISDOM TOOTH EXTRACTION  2002    Social History   Socioeconomic History  . Marital status: Married    Spouse name: Not on file  . Number of children: Not on file  . Years of education: Not on  file  . Highest education level: Not on file  Social Needs  . Financial resource strain: Not on file  . Food insecurity - worry: Not on file  . Food insecurity - inability: Not on file  . Transportation needs - medical: Not on file  . Transportation needs - non-medical: Not on file  Occupational History  . Not on file  Tobacco Use  . Smoking status: Former Smoker    Packs/day: 1.00    Years: 13.00    Pack years: 13.00    Types: Cigarettes    Last attempt to quit: 08/13/2011    Years since quitting: 5.9  . Smokeless tobacco: Never Used  Substance and Sexual  Activity  . Alcohol use: No  . Drug use: No  . Sexual activity: Yes    Birth control/protection: None    Comment: currently [redacted] wks pregnant  Other Topics Concern  . Not on file  Social History Narrative  . Not on file     FAMILY HISTORY:  We obtained a detailed, 4-generation family history.  Significant diagnoses are listed below: Family History  Problem Relation Age of Onset  . Diabetes Mother   . Cancer Mother 71       chondrosarcoma  . Breast cancer Mother 1       DCIS vs Atypical Hyperplasia  . Breast cancer Other        Maternal Great Aunt; MGMs sister  . Breast cancer Other        Maternal Great Aunt  . Other Maternal Grandmother        d. from complications of a fall and breaking her hip  . Alcohol abuse Maternal Grandfather   . Colon cancer Paternal Grandmother     The patient has two young daughters who are cancer free.  She has a maternal half brother (brother conceived by sperm donor) who is cancer free.  Both parents are living.    The patient's mother was diagnosed with a chondrosarcoma of her left scapula at age 47.  She was also diagnosed with atypical hyperplasia with calcifications within the duct at age 92.  This was removed by lumpectomy.  She is an only child.  The maternal grandparents are both deceased from non cancer related issues.  The grandmother was one of 13 children.  One sister had breast cancer.  The patient's father is alive at 92.  He has one full brother and two maternal half brothers and one maternal half sister.  None of his siblings have cancer.  The paternal grandparents are deceased.  The grandmother had colon cancer.  [name] is unaware of previous family history of genetic testing for hereditary cancer risks. Patient's maternal ancestors are of [place] descent, and paternal ancestors are of [place] descent. There is no reported Ashkenazi Jewish ancestry. There is no known consanguinity.  GENETIC COUNSELING ASSESSMENT: [name]  is a 37 y.o. female with a personal and family history of cancer which is somewhat suggestive of a hereditary cancer syndrome and predisposition to cancer. We, therefore, discussed and recommended the following at today's visit.   DISCUSSION: We discussed that about 5-10% of breast cancer is hereditary with most cases due to BRCA mutations.  There are other genes that are associated with hereditary breast cancer syndromes, including ATM, CHEK2 and PALB2. We discussed that her family history is fairly reassuring, but that her age of onset is concerning to [place].    We reviewed the characteristics, features and inheritance patterns  of hereditary cancer syndromes. We also discussed genetic testing, including the appropriate family members to test, the process of testing, insurance coverage and turn-around-time for results. We discussed the implications of a negative, positive and/or variant of uncertain significant result. In order to get genetic test results in a timely manner so that [name] can use these genetic test results for surgical decisions, we recommended [name] pursue genetic testing for the [facility] 9-gene STAT panel. If this test is negative, we then recommend [name] pursue reflex genetic testing to the Common hereditary cancer gene panel. The genes on the STAT panel are also on the common hereditary panel.  The Hereditary Gene Panel offered by [facility] includes sequencing and/or deletion duplication testing of the following 47 genes: APC, ATM, AXIN2, BARD1, BMPR1A, BRCA1, BRCA2, BRIP1, CDH1, CDK4, CDKN2A (p14ARF), CDKN2A (p16INK4a), CHEK2, CTNNA1, DICER1, EPCAM (Deletion/duplication testing only), GREM1 (promoter region deletion/duplication testing only), KIT, MEN1, MLH1, MSH2, MSH3, MSH6, MUTYH, NBN, NF1, NHTL1, PALB2, PDGFRA, PMS2, POLD1, POLE, PTEN, RAD50, RAD51C, RAD51D, SDHB, SDHC, SDHD, SMAD4, SMARCA4. STK11, TP53, TSC1, TSC2, and VHL.  The following genes were evaluated for sequence  changes only: SDHA and HOXB13 c.251G>A variant only.     Based on [name]'s personal and family history of cancer, she meets medical criteria for genetic testing. Despite that she meets criteria, she may still have an out of pocket cost. We discussed that if her out of pocket cost for testing is over $100, the laboratory will call and confirm whether she wants to proceed with testing.  If the out of pocket cost of testing is less than $100 she will be billed by the genetic testing laboratory.   PLAN: After considering the risks, benefits, and limitations, [name]  provided informed consent to pursue genetic testing and the blood sample was sent to [facility] for analysis of the STAT panel and the common hereditary cancer panel. Results should be available within approximately 10-14 days.  IF the common hereditary cancer panel is reflexed, then it will be an additional 1-2 weeks' time, at which point they will be disclosed by telephone to [name], as will any additional recommendations warranted by these results. [name] will receive a summary of her genetic counseling visit and a copy of her results once available. This information will also be available in Epic. We encouraged [name] to remain in contact with cancer genetics annually so that we can continuously update the family history and inform her of any changes in cancer genetics and testing that may be of benefit for her family. [name] questions were answered to her satisfaction today. Our contact information was provided should additional questions or concerns arise.  Lastly, we encouraged [name] to remain in contact with cancer genetics annually so that we can continuously update the family history and inform her of any changes in cancer genetics and testing that may be of benefit for this family.   Ms.  [name] questions were answered to her satisfaction today. Our contact information was provided should  additional questions or concerns arise. Thank you for the referral and allowing [place] to share in the care of your patient.   Adrain Nesbit P. Florene Glen, [place], [facility] Certified Genetic Counselor [email or web address] phone: [phone]  The patient was seen for a total of 35 minutes in face-to-face genetic counseling.  This patient was discussed with Drs. Magrinat, Lindi Adie and/or Burr Medico who agrees with the above.    _______________________________________________________________________ For Office Staff:  Number of people  involved in session: 2 Was an Intern/ student involved with case: no

## 2017-08-08 ENCOUNTER — Telehealth: Payer: Self-pay | Admitting: *Deleted

## 2017-08-08 NOTE — Telephone Encounter (Signed)
Spoke to pt regarding [employer] from 07/31/17. Denies questions or concerns regarding dx or treatment care plan. Encourage pt to call with needs. Received verbal understanding. Contact information provided.

## 2017-08-09 ENCOUNTER — Ambulatory Visit: Payer: Self-pay | Admitting: Genetic Counselor

## 2017-08-09 ENCOUNTER — Telehealth: Payer: Self-pay | Admitting: Genetic Counselor

## 2017-08-09 ENCOUNTER — Other Ambulatory Visit: Payer: Self-pay

## 2017-08-09 ENCOUNTER — Encounter: Payer: Self-pay | Admitting: Genetic Counselor

## 2017-08-09 ENCOUNTER — Encounter (HOSPITAL_BASED_OUTPATIENT_CLINIC_OR_DEPARTMENT_OTHER): Payer: Self-pay | Admitting: *Deleted

## 2017-08-09 DIAGNOSIS — C50212 Malignant neoplasm of upper-inner quadrant of left female breast: Secondary | ICD-10-CM

## 2017-08-09 DIAGNOSIS — Z1379 Encounter for other screening for genetic and chromosomal anomalies: Secondary | ICD-10-CM | POA: Insufficient documentation

## 2017-08-09 DIAGNOSIS — Z170 Estrogen receptor positive status [ER+]: Secondary | ICD-10-CM

## 2017-08-09 DIAGNOSIS — Z803 Family history of malignant neoplasm of breast: Secondary | ICD-10-CM

## 2017-08-09 DIAGNOSIS — Z809 Family history of malignant neoplasm, unspecified: Secondary | ICD-10-CM

## 2017-08-09 DIAGNOSIS — Z800 Family history of malignant neoplasm of digestive organs: Secondary | ICD-10-CM

## 2017-08-09 NOTE — Telephone Encounter (Signed)
Revealed negative genetic testing.  Discussed that we do not know why she has breast cancer or why there is cancer in the family. It could be due to a different gene that we are not testing, or maybe our current technology may not be able to pick something up.  It will be important for her to keep in contact with genetics to keep up with whether additional testing may be needed. 

## 2017-08-09 NOTE — Progress Notes (Signed)
HPI: Ms. Tolley was previously seen in the [facility] clinic due to a personal and family history of cancer and concerns regarding a hereditary predisposition to cancer. Please refer to our prior cancer genetics clinic note for more information regarding Ms. Mayeux's medical, social and family histories, and our assessment and recommendations, at the time. Ms. Lisbon recent genetic test results were disclosed to her, as were recommendations warranted by these results. These results and recommendations are discussed in more detail below.  CANCER HISTORY:    Malignant neoplasm of upper-inner quadrant of left breast in female, estrogen receptor positive ([place])   07/24/2017 Initial Diagnosis    Left breast palpable lump upper inner quadrant 11 o'clock position: Ill-defined 1.6 cm mass, axilla ultrasound negative, ultrasound-guided biopsy: Grade 2 IDC ER 90%, PR 40%, Ki-67 15%, HER-2 negative      08/09/2017 Genetic Testing    Negative genetic testing common hereditary cancer panel.  The Hereditary Gene Panel offered by [facility] includes sequencing and/or deletion duplication testing of the following 47 genes: APC, ATM, AXIN2, BARD1, BMPR1A, BRCA1, BRCA2, BRIP1, CDH1, CDK4, CDKN2A (p14ARF), CDKN2A (p16INK4a), CHEK2, CTNNA1, DICER1, EPCAM (Deletion/duplication testing only), GREM1 (promoter region deletion/duplication testing only), KIT, MEN1, MLH1, MSH2, MSH3, MSH6, MUTYH, NBN, NF1, NHTL1, PALB2, PDGFRA, PMS2, POLD1, POLE, PTEN, RAD50, RAD51C, RAD51D, SDHB, SDHC, SDHD, SMAD4, SMARCA4. STK11, TP53, TSC1, TSC2, and VHL.  The following genes were evaluated for sequence changes only: SDHA and HOXB13 c.251G>A variant only. The report date is August 07, 2017.       FAMILY HISTORY:  We obtained a detailed, 4-generation family history.  Significant diagnoses are listed below: Family History  Problem Relation Age of Onset  . Diabetes Mother   . Cancer Mother 53       chondrosarcoma  . Breast  cancer Mother 58       DCIS vs Atypical Hyperplasia  . Breast cancer Other        Maternal Great Aunt; MGMs sister  . Breast cancer Other        Maternal Great Aunt  . Other Maternal Grandmother        d. from complications of a fall and breaking her hip  . Alcohol abuse Maternal Grandfather   . Colon cancer Paternal Grandmother     The patient has two young daughters who are cancer free.  She has a maternal half brother (brother conceived by sperm donor) who is cancer free.  Both parents are living.    The patient's mother was diagnosed with a chondrosarcoma of her left scapula at age 90.  She was also diagnosed with atypical hyperplasia with calcifications within the duct at age 17.  This was removed by lumpectomy.  She is an only child.  The maternal grandparents are both deceased from non cancer related issues.  The grandmother was one of 66 children.  One sister had breast cancer.  The patient's father is alive at 68.  He has one full brother and two maternal half brothers and one maternal half sister.  None of his siblings have cancer.  The paternal grandparents are deceased.  The grandmother had colon cancer.  Ms. Wallenstein is unaware of previous family history of genetic testing for hereditary cancer risks. Patient's maternal ancestors are of [place] descent, and paternal ancestors are of [place] descent. There is no reported Ashkenazi Jewish ancestry. There is no known consanguinity.  GENETIC TEST RESULTS: Genetic testing reported out on August 07, 2017 through the common hereditary cancer  panel found no deleterious mutations.  The Hereditary Gene Panel offered by [facility] includes sequencing and/or deletion duplication testing of the following 47 genes: APC, ATM, AXIN2, BARD1, BMPR1A, BRCA1, BRCA2, BRIP1, CDH1, CDK4, CDKN2A (p14ARF), CDKN2A (p16INK4a), CHEK2, CTNNA1, DICER1, EPCAM (Deletion/duplication testing only), GREM1 (promoter region deletion/duplication testing only), KIT, MEN1,  MLH1, MSH2, MSH3, MSH6, MUTYH, NBN, NF1, NHTL1, PALB2, PDGFRA, PMS2, POLD1, POLE, PTEN, RAD50, RAD51C, RAD51D, SDHB, SDHC, SDHD, SMAD4, SMARCA4. STK11, TP53, TSC1, TSC2, and VHL.  The following genes were evaluated for sequence changes only: SDHA and HOXB13 c.251G>A variant only.   The test report has been scanned into EPIC and is located under the Molecular Pathology section of the Results Review tab.   We discussed with Ms. Stagner that since the current genetic testing is not perfect, it is possible there may be a gene mutation in one of these genes that current testing cannot detect, but that chance is small. We also discussed, that it is possible that another gene that has not yet been discovered, or that we have not yet tested, is responsible for the cancer diagnoses in the family, and it is, therefore, important to remain in touch with cancer genetics in the future so that we can continue to offer Ms. Halk the most up to date genetic testing.    CANCER SCREENING RECOMMENDATIONS:  This result is reassuring and indicates that Ms. Tangredi likely does not have an increased risk for a future cancer due to a mutation in one of these genes. This normal test also suggests that Ms. Corliss's cancer was most likely not due to an inherited predisposition associated with one of these genes.  Most cancers happen by chance and this negative test suggests that her cancer falls into this category.  We, therefore, recommended she continue to follow the cancer management and screening guidelines provided by her oncology and primary healthcare provider.   An individual's cancer risk and medical management are not determined by genetic test results alone. Overall cancer risk assessment incorporates additional factors, including personal medical history, family history, and any available genetic information that may result in a personalized plan for cancer prevention and surveillance.  RECOMMENDATIONS FOR FAMILY  MEMBERS: Women in this family might be at some increased risk of developing cancer, over the general population risk, simply due to the family history of cancer. We recommended women in this family have a yearly mammogram beginning at age 55, or 86 years younger than the earliest onset of cancer, an annual clinical breast exam, and perform monthly breast self-exams. Women in this family should also have a gynecological exam as recommended by their primary provider. All family members should have a colonoscopy by age 68.  FOLLOW-UP: Lastly, we discussed with Ms. Oak that cancer genetics is a rapidly advancing field and it is possible that new genetic tests will be appropriate for her and/or her family members in the future. We encouraged her to remain in contact with cancer genetics on an annual basis so we can update her personal and family histories and let her know of advances in cancer genetics that may benefit this family.   Our contact number was provided. Ms. Radovich questions were answered to her satisfaction, and she knows she is welcome to call us at anytime with additional questions or concerns.   Roma Kayser, MS, [facility] Certified Genetic Counselor [email or web address]

## 2017-08-09 NOTE — Pre-Procedure Instructions (Signed)
To come pick up Ensure pre-surgery drink 10 oz. - to drink by 0730 DOS. 

## 2017-08-15 NOTE — H&P (Signed)
History of Present Illness Sabrina Kitchen T. Jaylise Peek MD; 07/31/2017 11:11 AM) The patient is a 37 year old female who presents with breast cancer. She is a premenopausal female referred by Dr. Marcelo Baldy for evaluation of recently diagnosed carcinoma of the left breast. She recently presented for a screening mamogram revealing a left breast mass.. Subsequent imaging included diagnostic mamogram showing an irregular mass in the left breast and ultrasound showing a 1.6 cm irregular hypoechoic mass at the 11:00 position of the left breast upper inner quadrant. An ultrasound guided breast biopsy was performed on [place] 27th 2019 with pathology revealing invasive ductal carcinoma of the breast. She is seen now in [place] for initial treatment planning. She has experienced a breast lump but no other worrisome symptoms such as skin changes or nipple discharge or pain. She does not have a personal history of any previous breast problems.  Findings at that time were the following: Tumor size: 1.6 cm Tumor grade: 2 Estrogen Receptor: positive Progesterone Receptor: positive Her-2 neu: negative Lymph node status: negative    Past Surgical History Tawni Pummel, RN; 07/31/2017 7:30 AM) Cesarean Section - Multiple  Gallbladder Surgery - Laparoscopic  Oral Surgery   Diagnostic Studies History Tawni Pummel, RN; 07/31/2017 7:30 AM) Colonoscopy  never Mammogram  within last year Pap Smear  1-5 years ago  Medication History Tawni Pummel, RN; 07/31/2017 7:31 AM) Medications Reconciled  Social History Tawni Pummel, RN; 07/31/2017 7:30 AM) Alcohol use  Occasional alcohol use. Caffeine use  Carbonated beverages. No drug use  Tobacco use  Former smoker.  Family History Tawni Pummel, RN; 07/31/2017 7:30 AM) Alcohol Abuse  Brother, Family Members In General. Cancer  Mother. Diabetes Mellitus  Mother.  Pregnancy / Birth History Tawni Pummel, RN; 07/31/2017 7:30 AM) Age at menarche  58  years. Contraceptive History  Oral contraceptives. Gravida  2 Maternal age  76-35 Para  2 Regular periods   Other Problems Tawni Pummel, RN; 07/31/2017 7:30 AM) Anxiety Disorder  Cholelithiasis  Gastroesophageal Reflux Disease  Hemorrhoids     Review of Systems Sunday Spillers Ledford RN; 07/31/2017 7:30 AM) General Present- Appetite Loss and Night Sweats. Not Present- Chills, Fatigue, Fever, Weight Gain and Weight Loss. Skin Present- Dryness. Not Present- Change in Wart/Mole, Hives, Jaundice, New Lesions, Non-Healing Wounds, Rash and Ulcer. HEENT Present- Wears glasses/contact lenses. Not Present- Earache, Hearing Loss, Hoarseness, Nose Bleed, Oral Ulcers, Ringing in the Ears, Seasonal Allergies, Sinus Pain, Sore Throat, Visual Disturbances and Yellow Eyes. Breast Present- Breast Mass. Not Present- Breast Pain, Nipple Discharge and Skin Changes. Psychiatric Present- Anxiety. Not Present- Bipolar, Change in Sleep Pattern, Depression, Fearful and Frequent crying.   Physical Exam Sabrina Kitchen T. Damonica Chopra MD; 07/31/2017 11:12 AM) The physical exam findings are as follows: Note:General: Alert, mildly overweight Caucasian female, in no distress Skin: Warm and dry without rash or infection. HEENT: No palpable masses or thyromegaly. Sclera nonicteric. Pupils equal round and reactive. Lymph nodes: No cervical, supraclavicular, nodes palpable. Breasts: There is a approximately 2 cm firm freely movable mass in the upper inner left breast. Large pendulous breasts bilaterally. No other palpa adenopathy.n either breast. No palpable axillary adenopathy. Lungs: Breath sounds clear and equal. No wheezing or increased work of breathing. Cardiovascular: Regular rate and rhythm without murmer. No JVD or edema. Peripheral pulses intact. No carotid bruits. Abdomen: Nondistended. Soft and nontender. No masses palpable. Extremities: No edema or joint swelling or deformity. No chronic venous stasis  changes. Neurologic: Alert and fully oriented. Gait normal. No focal weakness.  Psychiatric: Normal mood and affect. Thought content appropriate with normal judgement and insight    Assessment & Plan Sabrina Kitchen T. Allyce Bochicchio MD; 07/31/2017 11:17 AM) MALIGNANT NEOPLASM OF LEFT BREAST, STAGE 1, ESTROGEN RECEPTOR POSITIVE (C50.912) Impression: 37 year old female with a new diagnosis of cancer of the left breast, upper outer quadrant. Clinical stage Ia, ER positive, PR positive, HER-2 negative. I discussed with the patient and her husband and mother today initial surgical treatment options. We discussed options of breast conservation with lumpectomy or total mastectomy and sentinal lymph node biopsy/dissection. Options for reconstruction were discussed. I believation would be a good candidate for breast conservation and this is what she would prefer unless she tests positive genetically. She has very large pendulous breasts and in order to obtain good margins and her beal candidate foration I think she would be an ideal candidate for staged breast reduction in conjor plastic surgery opinion.y. We will refer her for plastic surgery opinion. We discussed the indications and nature of the procedure, and expected recovery, in detail. Surgical risks including anesthetic complications, cardiorespiratory complications, bleeding, infection, wound healing complications, blood clots, lymphedema, local and distant recurrence and possible need for further surgery based on the final pathology was discussed and understood. Chemotherapy, hormonal therapy and radiation therapy have been discussed. They have been provided with literature regarding the treatment of breast cancer. All questions were answered. They understand and agree to proceed and we will go ahead with scheduling, tentatively, pending genetic testing and plastic surgery referral. Current Plans Referred to Genetic Counseling, for evaluation and follow up (Medical  Genetics). Routine. Referred to Surgery - Plastic, for evaluation and follow up (Plastic Surgery). Routine. radioactive seed localized left breast lumpectomy and left axillary sentinel lymph node biopsy in conjunction with bilateral mammoplasty by plastic surgery. We will need to schedule far enough out to allow results of genetic testing.

## 2017-08-15 NOTE — Progress Notes (Signed)
Ensure pre surgery drink given with instructions to complete by 0730 dos, surgical soap given with instructions, pt verbalized understanding. 

## 2017-08-16 ENCOUNTER — Other Ambulatory Visit: Payer: Self-pay

## 2017-08-16 ENCOUNTER — Ambulatory Visit (HOSPITAL_BASED_OUTPATIENT_CLINIC_OR_DEPARTMENT_OTHER): Payer: No Typology Code available for payment source | Admitting: Certified Registered"

## 2017-08-16 ENCOUNTER — Encounter: Payer: Self-pay | Admitting: Hematology and Oncology

## 2017-08-16 ENCOUNTER — Ambulatory Visit (HOSPITAL_COMMUNITY)
Admission: RE | Admit: 2017-08-16 | Discharge: 2017-08-16 | Disposition: A | Discharge status: Home / Self Care | Payer: No Typology Code available for payment source | Source: Ambulatory Visit | Attending: General Surgery | Admitting: General Surgery

## 2017-08-16 ENCOUNTER — Ambulatory Visit (HOSPITAL_BASED_OUTPATIENT_CLINIC_OR_DEPARTMENT_OTHER)
Admission: RE | Admit: 2017-08-16 | Discharge: 2017-08-16 | Disposition: A | Discharge status: Home / Self Care | Payer: No Typology Code available for payment source | Source: Ambulatory Visit | Attending: General Surgery | Admitting: General Surgery

## 2017-08-16 ENCOUNTER — Encounter (HOSPITAL_BASED_OUTPATIENT_CLINIC_OR_DEPARTMENT_OTHER): Payer: Self-pay | Admitting: Certified Registered"

## 2017-08-16 ENCOUNTER — Encounter (HOSPITAL_BASED_OUTPATIENT_CLINIC_OR_DEPARTMENT_OTHER)
Admission: RE | Disposition: A | Discharge status: Home / Self Care | Payer: Self-pay | Source: Ambulatory Visit | Attending: General Surgery

## 2017-08-16 DIAGNOSIS — Z87891 Personal history of nicotine dependence: Secondary | ICD-10-CM | POA: Diagnosis not present

## 2017-08-16 DIAGNOSIS — F419 Anxiety disorder, unspecified: Secondary | ICD-10-CM | POA: Diagnosis not present

## 2017-08-16 DIAGNOSIS — C773 Secondary and unspecified malignant neoplasm of axilla and upper limb lymph nodes: Secondary | ICD-10-CM | POA: Diagnosis not present

## 2017-08-16 DIAGNOSIS — Z79899 Other long term (current) drug therapy: Secondary | ICD-10-CM | POA: Diagnosis not present

## 2017-08-16 DIAGNOSIS — Z170 Estrogen receptor positive status [ER+]: Secondary | ICD-10-CM | POA: Diagnosis not present

## 2017-08-16 DIAGNOSIS — C50412 Malignant neoplasm of upper-outer quadrant of left female breast: Secondary | ICD-10-CM | POA: Insufficient documentation

## 2017-08-16 DIAGNOSIS — C50912 Malignant neoplasm of unspecified site of left female breast: Secondary | ICD-10-CM

## 2017-08-16 HISTORY — PX: BREAST LUMPECTOMY WITH SENTINEL LYMPH NODE BIOPSY: SHX5597

## 2017-08-16 HISTORY — DX: Malignant neoplasm of unspecified site of left female breast: C50.912

## 2017-08-16 HISTORY — PX: BREAST LUMPECTOMY WITH RADIOACTIVE SEED AND SENTINEL LYMPH NODE BIOPSY: SHX6550

## 2017-08-16 SURGERY — BREAST LUMPECTOMY WITH RADIOACTIVE SEED AND SENTINEL LYMPH NODE BIOPSY
Anesthesia: General | Site: Breast | Laterality: Left

## 2017-08-16 MED ORDER — HEPARIN SODIUM (PORCINE) 5000 UNIT/ML IJ SOLN: 5000.0000 [IU] | Freq: Once | INTRAMUSCULAR | Status: DC

## 2017-08-16 MED ORDER — LIDOCAINE HCL (CARDIAC) 20 MG/ML IV SOLN
INTRAVENOUS | Status: DC | PRN
  Administered 2017-08-16: 30 mg via INTRAVENOUS

## 2017-08-16 MED ORDER — DEXAMETHASONE SODIUM PHOSPHATE 4 MG/ML IJ SOLN
INTRAMUSCULAR | Status: DC | PRN
  Administered 2017-08-16: 10 mg via INTRAVENOUS

## 2017-08-16 MED ORDER — CHLORHEXIDINE GLUCONATE CLOTH 2 % EX PADS: 6.0000 | MEDICATED_PAD | Freq: Once | CUTANEOUS | Status: DC

## 2017-08-16 MED ORDER — ACETAMINOPHEN 500 MG PO TABS
1000.0000 mg | ORAL_TABLET | Freq: Once | ORAL | Status: AC
  Administered 2017-08-16: 1000 mg via ORAL

## 2017-08-16 MED ORDER — GABAPENTIN 300 MG PO CAPS
300.0000 mg | ORAL_CAPSULE | Freq: Once | ORAL | Status: AC
  Administered 2017-08-16: 300 mg via ORAL

## 2017-08-16 MED ORDER — TECHNETIUM TC 99M SULFUR COLLOID FILTERED
1.0000 | Freq: Once | INTRAVENOUS | Status: AC | PRN
  Administered 2017-08-16: 1 via INTRADERMAL

## 2017-08-16 MED ORDER — BUPIVACAINE-EPINEPHRINE (PF) 0.5% -1:200000 IJ SOLN
INTRAMUSCULAR | Status: AC
  Filled 2017-08-16: qty 30

## 2017-08-16 MED ORDER — FENTANYL CITRATE (PF) 100 MCG/2ML IJ SOLN
INTRAMUSCULAR | Status: AC
  Filled 2017-08-16: qty 2

## 2017-08-16 MED ORDER — CELECOXIB 200 MG PO CAPS
200.0000 mg | ORAL_CAPSULE | Freq: Once | ORAL | Status: AC
  Administered 2017-08-16: 200 mg via ORAL

## 2017-08-16 MED ORDER — CELECOXIB 200 MG PO CAPS: 200.0000 mg | ORAL_CAPSULE | ORAL | Status: DC

## 2017-08-16 MED ORDER — EPHEDRINE SULFATE 50 MG/ML IJ SOLN
INTRAMUSCULAR | Status: DC | PRN
  Administered 2017-08-16 (×3): 10 mg via INTRAVENOUS

## 2017-08-16 MED ORDER — CEFAZOLIN SODIUM-DEXTROSE 2-4 GM/100ML-% IV SOLN: 2.0000 g | INTRAVENOUS | Status: DC

## 2017-08-16 MED ORDER — BUPIVACAINE-EPINEPHRINE (PF) 0.5% -1:200000 IJ SOLN
INTRAMUSCULAR | Status: DC | PRN
  Administered 2017-08-16: 30 mL

## 2017-08-16 MED ORDER — PROPOFOL 10 MG/ML IV BOLUS
INTRAVENOUS | Status: DC | PRN
  Administered 2017-08-16: 200 mg via INTRAVENOUS

## 2017-08-16 MED ORDER — OXYCODONE HCL 5 MG/5ML PO SOLN: 5.0000 mg | Freq: Once | ORAL | Status: DC | PRN

## 2017-08-16 MED ORDER — CELECOXIB 200 MG PO CAPS
ORAL_CAPSULE | ORAL | Status: AC
  Filled 2017-08-16: qty 1

## 2017-08-16 MED ORDER — MIDAZOLAM HCL 2 MG/2ML IJ SOLN
INTRAMUSCULAR | Status: AC
  Filled 2017-08-16: qty 2

## 2017-08-16 MED ORDER — MIDAZOLAM HCL 2 MG/2ML IJ SOLN
1.0000 mg | INTRAMUSCULAR | Status: DC | PRN
  Administered 2017-08-16: 1 mg via INTRAVENOUS

## 2017-08-16 MED ORDER — 0.9 % SODIUM CHLORIDE (POUR BTL) OPTIME
TOPICAL | Status: DC | PRN
  Administered 2017-08-16: 1000 mL

## 2017-08-16 MED ORDER — ONDANSETRON HCL 4 MG/2ML IJ SOLN
INTRAMUSCULAR | Status: DC | PRN
  Administered 2017-08-16: 4 mg via INTRAVENOUS

## 2017-08-16 MED ORDER — ACETAMINOPHEN 500 MG PO TABS
ORAL_TABLET | ORAL | Status: AC
  Filled 2017-08-16: qty 2

## 2017-08-16 MED ORDER — ACETAMINOPHEN 500 MG PO TABS: 1000.0000 mg | ORAL_TABLET | ORAL | Status: DC

## 2017-08-16 MED ORDER — OXYCODONE HCL 5 MG PO TABS: 5.0000 mg | ORAL_TABLET | Freq: Once | ORAL | Status: DC | PRN

## 2017-08-16 MED ORDER — HYDROCODONE-ACETAMINOPHEN 5-325 MG PO TABS: 1.0000 | ORAL_TABLET | Freq: Four times a day (QID) | ORAL | 0 refills | Status: DC | PRN

## 2017-08-16 MED ORDER — GABAPENTIN 300 MG PO CAPS: 300.0000 mg | ORAL_CAPSULE | ORAL | Status: DC

## 2017-08-16 MED ORDER — HYDROMORPHONE HCL 1 MG/ML IJ SOLN: 0.2500 mg | INTRAMUSCULAR | Status: DC | PRN

## 2017-08-16 MED ORDER — CEFAZOLIN SODIUM-DEXTROSE 2-4 GM/100ML-% IV SOLN
2.0000 g | INTRAVENOUS | Status: AC
  Administered 2017-08-16 (×2): 2 g via INTRAVENOUS

## 2017-08-16 MED ORDER — FENTANYL CITRATE (PF) 100 MCG/2ML IJ SOLN
50.0000 ug | INTRAMUSCULAR | Status: AC | PRN
  Administered 2017-08-16: 50 ug via INTRAVENOUS
  Administered 2017-08-16: 75 ug via INTRAVENOUS
  Administered 2017-08-16: 25 ug via INTRAVENOUS
  Administered 2017-08-16 (×2): 50 ug via INTRAVENOUS

## 2017-08-16 MED ORDER — LACTATED RINGERS IV SOLN
INTRAVENOUS | Status: DC
  Administered 2017-08-16 (×2): via INTRAVENOUS

## 2017-08-16 MED ORDER — SCOPOLAMINE 1 MG/3DAYS TD PT72: 1.0000 | MEDICATED_PATCH | Freq: Once | TRANSDERMAL | Status: DC | PRN

## 2017-08-16 MED ORDER — BUPIVACAINE-EPINEPHRINE (PF) 0.25% -1:200000 IJ SOLN
INTRAMUSCULAR | Status: DC | PRN
  Administered 2017-08-16: 15 mL

## 2017-08-16 MED ORDER — CEFAZOLIN SODIUM-DEXTROSE 2-4 GM/100ML-% IV SOLN
INTRAVENOUS | Status: AC
  Filled 2017-08-16: qty 100

## 2017-08-16 MED ORDER — GABAPENTIN 300 MG PO CAPS
ORAL_CAPSULE | ORAL | Status: AC
  Filled 2017-08-16: qty 1

## 2017-08-16 SURGICAL SUPPLY — 42 items
ADH SKN CLS APL DERMABOND .7 (GAUZE/BANDAGES/DRESSINGS) ×1
BINDER BREAST XXLRG (GAUZE/BANDAGES/DRESSINGS) ×2 IMPLANT
BLADE SURG 15 STRL LF DISP TIS (BLADE) ×1 IMPLANT
BLADE SURG 15 STRL SS (BLADE) ×2
CANISTER SUCT 1200ML W/VALVE (MISCELLANEOUS) ×1 IMPLANT
CHLORAPREP W/TINT 26ML (MISCELLANEOUS) ×2 IMPLANT
CLIP VESOCCLUDE SM WIDE 6/CT (CLIP) ×1 IMPLANT
COVER BACK TABLE 60X90IN (DRAPES) ×2 IMPLANT
COVER MAYO STAND STRL (DRAPES) ×2 IMPLANT
COVER PROBE W GEL 5X96 (DRAPES) ×2 IMPLANT
DERMABOND ADVANCED (GAUZE/BANDAGES/DRESSINGS) ×1
DERMABOND ADVANCED .7 DNX12 (GAUZE/BANDAGES/DRESSINGS) ×1 IMPLANT
DEVICE DUBIN W/COMP PLATE 8390 (MISCELLANEOUS) ×2 IMPLANT
DRAPE LAPAROSCOPIC ABDOMINAL (DRAPES) ×2 IMPLANT
DRAPE UTILITY XL STRL (DRAPES) ×2 IMPLANT
ELECT COATED BLADE 2.86 ST (ELECTRODE) ×2 IMPLANT
ELECT REM PT RETURN 9FT ADLT (ELECTROSURGICAL) ×2
ELECTRODE REM PT RTRN 9FT ADLT (ELECTROSURGICAL) ×1 IMPLANT
GLOVE BIOGEL PI IND STRL 8 (GLOVE) ×1 IMPLANT
GLOVE BIOGEL PI INDICATOR 8 (GLOVE) ×1
GLOVE ECLIPSE 7.5 STRL STRAW (GLOVE) ×2 IMPLANT
GOWN STRL REUS W/ TWL LRG LVL3 (GOWN DISPOSABLE) ×1 IMPLANT
GOWN STRL REUS W/ TWL XL LVL3 (GOWN DISPOSABLE) ×1 IMPLANT
GOWN STRL REUS W/TWL LRG LVL3 (GOWN DISPOSABLE) ×2
GOWN STRL REUS W/TWL XL LVL3 (GOWN DISPOSABLE) ×2
KIT MARKER MARGIN INK (KITS) ×2 IMPLANT
NDL HYPO 25X1 1.5 SAFETY (NEEDLE) ×2 IMPLANT
NDL SAFETY ECLIPSE 18X1.5 (NEEDLE) ×1 IMPLANT
NEEDLE HYPO 18GX1.5 SHARP (NEEDLE) ×2
NEEDLE HYPO 25X1 1.5 SAFETY (NEEDLE) ×4 IMPLANT
NS IRRIG 1000ML POUR BTL (IV SOLUTION) ×1 IMPLANT
PACK BASIN DAY SURGERY FS (CUSTOM PROCEDURE TRAY) ×2 IMPLANT
PENCIL BUTTON HOLSTER BLD 10FT (ELECTRODE) ×2 IMPLANT
SLEEVE SCD COMPRESS KNEE MED (MISCELLANEOUS) ×2 IMPLANT
SPONGE LAP 4X18 X RAY DECT (DISPOSABLE) ×2 IMPLANT
SUT MON AB 5-0 PS2 18 (SUTURE) ×4 IMPLANT
SUT VICRYL 3-0 CR8 SH (SUTURE) ×3 IMPLANT
SYR CONTROL 10ML LL (SYRINGE) ×4 IMPLANT
TOWEL OR 17X24 6PK STRL BLUE (TOWEL DISPOSABLE) ×2 IMPLANT
TOWEL OR NON WOVEN STRL DISP B (DISPOSABLE) ×2 IMPLANT
TUBE CONNECTING 20X1/4 (TUBING) ×1 IMPLANT
YANKAUER SUCT BULB TIP NO VENT (SUCTIONS) ×2 IMPLANT

## 2017-08-16 NOTE — Interval H&P Note (Signed)
History and Physical Interval Note:  08/16/2017 11:45 AM  [name]  has presented today for surgery, with the diagnosis of LEFT BREAST CANCER  The various methods of treatment have been discussed with the patient and family. After consideration of risks, benefits and other options for treatment, the patient has consented to  Procedure(s): BREAST LUMPECTOMY WITH RADIOACTIVE SEED AND SENTINEL LYMPH NODE BIOPSY (Left) as a surgical intervention .  The patient's history has been reviewed, patient examined, no change in status, stable for surgery.  I have reviewed the patient's chart and labs.  Questions were answered to the patient's satisfaction.     [name] Ellagrace Yoshida

## 2017-08-16 NOTE — Op Note (Signed)
Preoperative Diagnosis: LEFT BREAST CANCER  Postoprative Diagnosis: LEFT BREAST CANCER  Procedure: Procedure(s): BREAST LUMPECTOMY WITH RADIOACTIVE SEED AND SENTINEL LYMPH NODE BIOPSY   Surgeon: Excell Seltzer T   Assistants: None  Anesthesia:  General LMA anesthesia  Indications: 37 year old female with a new diagnosis of cancer of the left breast, upper outer quadrant. Clinical stage Ia, ER positive, PR positive, HER-2 negative.  After extensive preoperative workup and discussion detailed elsewhere we have elected to proceed with radioactive seed localized left breast lumpectomy and axillary sentinel lymph node biopsy with planned staged breast reduction to allow for a good oncologic resection and cosmetic result.      Procedure Detail: Dr. Iran Planas had previously marked the patient's breast with the reduction incisions.  She had undergone accurate placement of radioactive seed at the tumor and clip site.  Had also undergone a pectoral block by anesthesia and injection of 1 mCi of technetium sulfur colloid intradermally around the nipple.  With Dr. Iran Planas the breast was examined with the neoprobe and incision planned.  Patient was taken to the operating room, placed in supine position on the operating table, and laryngeal mask general anesthesia induced.  She received preoperative IV antibiotics.  The breast and axilla and upper arm were widely sterilely prepped and draped.  Patient timeout was performed and correct procedure verified.  The site of the seed and tumor were localized.  The planned reduction incision was used and incision made in the markings and dissection carried down into the subcutaneous tissue.  Short skin and subcutaneous flaps were raised superiorly and anteriorly.  Using the neoprobe for guidance as well as a palpable area post biopsy a generous lumpectomy was performed around the seed.  The specimen was removed and inked for margins.  Specimen x-ray showed the seed  and marking clip and tumor well contained within the specimen.  Complete hemostasis was assured.  The deep breast and subtenons tissue was closed with interrupted 3-0 Vicryl after marking the lumpectomy cavity with clips.  Attention was turned to the sentinel lymph node.  A hot area in the left axilla was identified.  A small transverse incision was made and dissection carried down through the subcutaneous tissue.  Clavipectoral fascia was incised.  Using the neoprobe for guidance I bluntly dissected down onto a slightly enlarged soft node with high counts which was completely excised.  Ex vivo this node had counts in excess of 600.  There was still some moderate counts in the axilla and just deep and anterior to this I dissected down onto a second normal-appearing lymph node which was excised and ex vivo had counts of about 300.  Background in the axilla at this point was less than 10 without anything localizing and I could feel no palpable adenopathy.  Complete hemostasis was assured in the deep axillary and subcutaneous tissue was closed with interrupted 3-0 Vicryl.  Both incisions were infiltrated with Marcaine and skin closed with running subcuticular 5-0 Monocryl and Dermabond.  Sponge needle and instrument counts were correct.    Findings: As above  Estimated Blood Loss:  Minimal         Drains: None  Blood Given: none          Specimens: #1 left breast lumpectomy   #2 left axillary sentinel lymph nodes  X 2        Complications:  * No complications entered in OR log *         Disposition: PACU -  hemodynamically stable.         Condition: stable

## 2017-08-16 NOTE — Progress Notes (Signed)
Assisted Dr. Orene Desanctis with left, ultrasound guided, pectoralis block. Side rails up, monitors on throughout procedure. See vital signs in flow sheet. Tolerated Procedure well.

## 2017-08-16 NOTE — Progress Notes (Signed)
Nuc med inj performed by nuc med staff. Pt tol well with additional sedation for comfort. VSS see flowsheet. Will call pt husband/mother back to bedside and update/provide emotional support.

## 2017-08-16 NOTE — Anesthesia Preprocedure Evaluation (Addendum)
Anesthesia Evaluation  Patient identified by MRN, date of birth, ID band Patient awake    Reviewed: Allergy & Precautions, NPO status , Patient's Chart, lab work & pertinent test results  Airway Mallampati: I  TM Distance: >3 FB Neck ROM: Full    Dental no notable dental hx.    Pulmonary former smoker,    Pulmonary exam normal breath sounds clear to auscultation       Cardiovascular Normal cardiovascular exam Rhythm:Regular Rate:Normal     Neuro/Psych Anxiety    GI/Hepatic   Endo/Other    Renal/GU      Musculoskeletal   Abdominal   Peds  Hematology   Anesthesia Other Findings   Reproductive/Obstetrics                            Anesthesia Physical Anesthesia Plan  ASA: II  Anesthesia Plan: General   Post-op Pain Management:  Regional for Post-op pain   Induction: Intravenous  PONV Risk Score and Plan: 3 and Ondansetron, Dexamethasone and Midazolam  Airway Management Planned: LMA  Additional Equipment:   Intra-op Plan:   Post-operative Plan: Extubation in OR  Informed Consent: I have reviewed the patients History and Physical, chart, labs and discussed the procedure including the risks, benefits and alternatives for the proposed anesthesia with the patient or authorized representative who has indicated his/her understanding and acceptance.     Plan Discussed with: CRNA and Surgeon  Anesthesia Plan Comments:         Anesthesia Quick Evaluation

## 2017-08-16 NOTE — Anesthesia Procedure Notes (Signed)
Procedure Name: LMA Insertion Date/Time: 08/16/2017 12:04 PM Performed by: Signe Colt, CRNA Pre-anesthesia Checklist: Patient identified, Emergency Drugs available, Suction available and Patient being monitored Patient Re-evaluated:Patient Re-evaluated prior to induction Oxygen Delivery Method: Circle system utilized Preoxygenation: Pre-oxygenation with 100% oxygen Induction Type: IV induction Ventilation: Mask ventilation without difficulty LMA: LMA inserted LMA Size: 4.0 Number of attempts: 1 Airway Equipment and Method: Bite block Placement Confirmation: positive ETCO2 Tube secured with: Tape Dental Injury: Teeth and Oropharynx as per pre-operative assessment

## 2017-08-16 NOTE — Discharge Instructions (Signed)
[facility] °Office Phone Number [phone] ° °BREAST BIOPSY/ LUMPECTOMY: POST OP INSTRUCTIONS ° °Always review your discharge instruction sheet given to you by the facility where your surgery was performed. ° °IF YOU HAVE DISABILITY OR FAMILY LEAVE FORMS, YOU MUST BRING THEM TO THE OFFICE FOR PROCESSING.  DO NOT GIVE THEM TO YOUR DOCTOR. ° °1. A prescription for pain medication may be given to you upon discharge.  Take your pain medication as prescribed, if needed.  If narcotic pain medicine is not needed, then you may take acetaminophen (Tylenol) or ibuprofen (Advil) as needed. °2. Take your usually prescribed medications unless otherwise directed °3. If you need a refill on your pain medication, please contact your pharmacy.  They will contact our office to request authorization.  Prescriptions will not be filled after 5pm or on week-ends. °4. You should eat very light the first 24 hours after surgery, such as soup, crackers, pudding, etc.  Resume your normal diet the day after surgery. °5. Most patients will experience some swelling and bruising in the breast.  Ice packs and a good support bra will help.  Swelling and bruising can take several days to resolve.  °6. It is common to experience some constipation if taking pain medication after surgery.  Increasing fluid intake and taking a stool softener will usually help or prevent this problem from occurring.  A mild laxative (Milk of Magnesia or Miralax) should be taken according to package directions if there are no bowel movements after 48 hours. °7. Unless discharge instructions indicate otherwise, you may remove your bandages 24-48 hours after surgery, and you may shower at that time.  You may have steri-strips (small skin tapes) in place directly over the incision.  These strips should be left on the skin for 7-10 days.  If your surgeon used skin glue on the incision, you may shower in 24 hours.  The glue will flake off over the next 2-3  weeks.  Any sutures or staples will be removed at the office during your follow-up visit. °8. ACTIVITIES:  You may resume regular daily activities (gradually increasing) beginning the next day.  Wearing a good support bra or sports bra minimizes pain and swelling.  You may have sexual intercourse when it is comfortable. °a. You may drive when you no longer are taking prescription pain medication, you can comfortably wear a seatbelt, and you can safely maneuver your car and apply brakes. °b. RETURN TO WORK:  ______________________________________________________________________________________ °9. You should see your doctor in the office for a follow-up appointment approximately two weeks after your surgery.  Your doctor’s nurse will typically make your follow-up appointment when she calls you with your pathology report.  Expect your pathology report 2-3 business days after your surgery.  You may call to check if you do not hear from us after three days. °10. OTHER INSTRUCTIONS: _______________________________________________________________________________________________ _____________________________________________________________________________________________________________________________________ °_____________________________________________________________________________________________________________________________________ °_____________________________________________________________________________________________________________________________________ ° °WHEN TO CALL YOUR DOCTOR: °1. Fever over 101.0 °2. Nausea and/or vomiting. °3. Extreme swelling or bruising. °4. Continued bleeding from incision. °5. Increased pain, redness, or drainage from the incision. ° °The clinic staff is available to answer your questions during regular business hours.  Please don’t hesitate to call and ask to speak to one of the nurses for clinical concerns.  If you have a medical emergency, go to the nearest emergency  room or call 911.  A surgeon from [facility] is always on call at the hospital. ° °For further questions, please visit [email or web address]  ° °  Post Anesthesia Home Care Instructions ° °Activity: °Get plenty of rest for the remainder of the day. A responsible individual must stay with you for 24 hours following the procedure.  °For the next 24 hours, DO NOT: °-Drive a car °-Operate machinery °-Drink alcoholic beverages °-Take any medication unless instructed by your physician °-Make any legal decisions or sign important papers. ° °Meals: °Start with liquid foods such as gelatin or soup. Progress to regular foods as tolerated. Avoid greasy, spicy, heavy foods. If nausea and/or vomiting occur, drink only clear liquids until the nausea and/or vomiting subsides. Call your physician if vomiting continues. ° °Special Instructions/Symptoms: °Your throat may feel dry or sore from the anesthesia or the breathing tube placed in your throat during surgery. If this causes discomfort, gargle with warm salt water. The discomfort should disappear within 24 hours. ° °If you had a scopolamine patch placed behind your ear for the management of post- operative nausea and/or vomiting: ° °1. The medication in the patch is effective for 72 hours, after which it should be removed.  Wrap patch in a tissue and discard in the trash. Wash hands thoroughly with soap and water. °2. You may remove the patch earlier than 72 hours if you experience unpleasant side effects which may include dry mouth, dizziness or visual disturbances. °3. Avoid touching the patch. Wash your hands with soap and water after contact with the patch. °  ° ° °

## 2017-08-16 NOTE — Anesthesia Procedure Notes (Addendum)
Anesthesia Regional Block: Pectoralis block   Pre-Anesthetic Checklist: ,, timeout performed, Correct Patient, Correct Site, Correct Laterality, Correct Procedure, Correct Position, site marked, Risks and benefits discussed,  Surgical consent,  Pre-op evaluation,  At surgeon's request and post-op pain management  Laterality: Left and Upper  Prep: chloraprep       Needles:   Needle Type: Echogenic Stimulator Needle     Needle Length: 9cm  Needle Gauge: 21   Needle insertion depth: 6 cm   Additional Needles:   Procedures:,,,, ultrasound used (permanent image in chart),,,,  Narrative:  Start time: 08/16/2017 10:30 AM End time: 08/16/2017 10:45 AM Injection made incrementally with aspirations every 5 mL.  Performed by: Personally  Anesthesiologist: Rica Koyanagi, MD

## 2017-08-16 NOTE — Anesthesia Postprocedure Evaluation (Signed)
Anesthesia Post Note  Patient: YANELIZ RADEBAUGH  Procedure(s) Performed: BREAST LUMPECTOMY WITH RADIOACTIVE SEED AND SENTINEL LYMPH NODE BIOPSY (Left Breast)     Patient location during evaluation: PACU Anesthesia Type: General Level of consciousness: awake and alert Pain management: pain level controlled Vital Signs Assessment: post-procedure vital signs reviewed and stable Respiratory status: spontaneous breathing, nonlabored ventilation, respiratory function stable and patient connected to nasal cannula oxygen Cardiovascular status: blood pressure returned to baseline and stable Postop Assessment: no apparent nausea or vomiting Anesthetic complications: no    Last Vitals:  Vitals:   08/16/17 1345 08/16/17 1414  BP: 120/78 135/72  Pulse: 96 97  Resp: 15 16  Temp:  36.7 C  SpO2: 98% 99%    Last Pain:  Vitals:   08/16/17 1414  TempSrc:   PainSc: 1                  Aceson Labell DAVID

## 2017-08-16 NOTE — Transfer of Care (Signed)
Immediate Anesthesia Transfer of Care Note  Patient: [name]  Procedure(s) Performed: BREAST LUMPECTOMY WITH RADIOACTIVE SEED AND SENTINEL LYMPH NODE BIOPSY (Left Breast)  Patient Location: PACU  Anesthesia Type:GA combined with regional for post-op pain  Level of Consciousness: awake, alert , oriented and patient cooperative  Airway & Oxygen Therapy: Patient Spontanous Breathing and Patient connected to face mask oxygen  Post-op Assessment: Report given to RN and Post -op Vital signs reviewed and stable  Post vital signs: Reviewed and stable  Last Vitals:  Vitals Value Taken Time  BP    Temp    Pulse 99 08/16/2017  1:24 PM  Resp 19 08/16/2017  1:24 PM  SpO2 96 % 08/16/2017  1:24 PM  Vitals shown include unvalidated device data.  Last Pain:  Vitals:   08/16/17 0825  TempSrc: Oral  PainSc: 0-No pain      Patients Stated Pain Goal: 3 ([id number])  Complications: No apparent anesthesia complications

## 2017-08-19 ENCOUNTER — Encounter (HOSPITAL_BASED_OUTPATIENT_CLINIC_OR_DEPARTMENT_OTHER): Payer: Self-pay | Admitting: *Deleted

## 2017-08-19 ENCOUNTER — Other Ambulatory Visit: Payer: Self-pay

## 2017-08-20 ENCOUNTER — Telehealth: Payer: Self-pay | Admitting: Hematology and Oncology

## 2017-08-20 ENCOUNTER — Telehealth: Payer: Self-pay | Admitting: *Deleted

## 2017-08-20 NOTE — Telephone Encounter (Signed)
Received order for Mammaprint testing. Requisition sent to pathology. Received by Varney Biles.

## 2017-08-20 NOTE — Telephone Encounter (Signed)
I spoke to the patient extensively about her pathology report on the telephone. We are requesting Mammaprint testing. Patient is undergoing breast reduction surgery this Friday. I will see her back after Mammaprint test results are available to discuss whether she needs chemotherapy. We will cancel this week's appointment and I will see her  after Mammaprint results are available.

## 2017-08-21 ENCOUNTER — Encounter: Payer: Self-pay | Admitting: General Practice

## 2017-08-21 ENCOUNTER — Telehealth: Payer: Self-pay

## 2017-08-21 NOTE — Progress Notes (Signed)
[facility] CSW Progress Note  Appointment scheduled on 3/28 at 11 AM w patient for support.  Edwyna Shell, LCSW Clinical Social Worker Phone:  [phone]

## 2017-08-21 NOTE — H&P (Signed)
Subjective:     Patient ID: [name] is a 37 y.o. unknown.  HPI  Patient of Drs. Hoxworth and Gudena with left breast cancer and plan for breast conservation and oncoplastic reconstruction. MMG/US revealed a 1.6 cm mass over left UIQ. Axilla normal on [place]. 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 1/2 SLN + with extracapsular extension.  Mammaprint on surgical specimen pending  Mother with ADH. Genetics negative.  Current 26F . Notes several year history back pain that has not improved with NSAIDs, specialty fitted bras, regular exercise. Notes rash present "100% of the time" that has failed topical measures. Notes tingling in fingers when she exercises. Wt stable. Desires C cup.  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  Review of Systems  Musculoskeletal: Positive for back pain.  Psychiatric/Behavioral: The patient is nervous/anxious.    Remaibder 12 point review negative    Objective:   Physical Exam  Constitutional: He is oriented to person, place, and time.  Cardiovascular: Normal rate.  Normal heart sounds Pulmonary/Chest: Effort normal. Normal breath sounds Abdominal: Soft.  Neurological: He is alert and oriented to person, place, and time.  Skin:  Fitzpatrick 2   Grade 3 ptosis bilateral Palpable lump left breast 12 o clock   SN to nipple R 38 L 38 cm BW R 20 L 20 cm Nipple to IMF R 14 L 14 cm  Assessment:     Left breast ca UIQ ER+ s/p left lumpectomy SLN Macromastia Chronic back pain Intertrigo    Plan:     Plan oncoplastic reconstruction as staged procedure with reduction 7-10 d post lumpectomy to ensure pathologic clearance.Reviewed reduction with anchor type scars, drains, post operative visits and limitations, recovery.   Discussed riskswound healing problems, asymmetry, diminished sensation breast.  Discussedsmaller breast size may aid with adjuvant radiation. Discussed will have some contraction of breast volume and increased firmness with radiation, less ptosis with aging. Discussed changes with wt gain, loss, aging. Discussed breast reduction postXRT would have higherrisk of complications. Counseled I cannot assure her bra size.   Irene Limbo, MD [facility]astic [facility] Reconstruct[facility] Surgery [phone], pin [phone]

## 2017-08-21 NOTE — Telephone Encounter (Signed)
Pt called to check what test results Dr.Gudena was waiting on. Told pt that a mammaprint will be sent out to determine risk of cancer recurrence. Usually takes 10 business days to come back with report. Pt verbalized understanding and thankful for getting questions clarified.

## 2017-08-22 ENCOUNTER — Encounter: Payer: Self-pay | Admitting: General Practice

## 2017-08-22 ENCOUNTER — Ambulatory Visit: Payer: 59 | Admitting: Hematology and Oncology

## 2017-08-22 NOTE — Progress Notes (Signed)
   [facility]osocial Assessment Clinical Social Work  Clinical Social Work was referred by patient request for anxiety related to diagnosis and upcoming surgery, chemotherapy and radiation.  Clinical Social Worker Edwyna Shell met w patient in office to assess psychosocial, emotional, mental health, and spiritual needs of the patient.  Patient's knowledge about cancer and its treatment including level of understanding, reactions, goals for care, and expectations:  Increasing anxiety related to progression of cancer diagnosis based on increased knowledge of treatment team.  Initially was less anxious as cancer was thought to be Stage 1 and caught early, requiring only surgery and radiation.  After more information was gathered, cancer was determined to be Stage 2 and chemotherapy is additionally recommended. Pt overwhelmed by possibility of chemotherapy and possible side effects/limitations on activities.    Characteristics of the patient's support system:  Married, 2 daughters (ages 63 and 63).  Parents and in laws live nearby and are very supportive, brother and wife live out of state but also supportive.  Has friends who live out of town, not as much of a friend support system in [place].  Parents tearful when learning of cancer diagnosis, patient also concerned about impact of cancer diagnosis especially on 37 year old.    Patient and family psychosocial functioning including strengths, limitations, and coping skills: Close and supportive family, husband has helped significantly with needs.  Parents and in laws are helping w children and other needs.  Patient has been anxious since childhood, in treatment for anxiety disorder since adolescence.  Reports that she has experienced frequent panic attacks; currently anxious throughout the day especially since diagnosis and finding out increasingly "worse news" about cancer staging and treatment plan.  Overwhelmed at thought of chemotherapy which  will be rigorous.  Identifications of barriers to care:  Patient's own tendency to be anxious about unknown/future makes thought of chemotherapy overwhelming, difficult to think about how she will be able to handle it.  Wants to remain active in activities w her children and their school/community activities  Availability of community resources:  Good support, insured, has access to resources for transport within the family.    Clinical Social Worker follow up needed: Yes.    If yes, follow up plan:  Will meet w patient weekly to work on anxiety management and stress reduction skills, next visit 08/29/17.  Edwyna Shell, LCSW Clinical Social Worker Phone:  [phone]

## 2017-08-23 ENCOUNTER — Encounter (HOSPITAL_BASED_OUTPATIENT_CLINIC_OR_DEPARTMENT_OTHER)
Admission: RE | Disposition: A | Discharge status: Home / Self Care | Payer: Self-pay | Source: Ambulatory Visit | Attending: Plastic Surgery

## 2017-08-23 ENCOUNTER — Encounter (HOSPITAL_BASED_OUTPATIENT_CLINIC_OR_DEPARTMENT_OTHER): Payer: Self-pay | Admitting: Anesthesiology

## 2017-08-23 ENCOUNTER — Ambulatory Visit (HOSPITAL_BASED_OUTPATIENT_CLINIC_OR_DEPARTMENT_OTHER): Payer: No Typology Code available for payment source | Admitting: Anesthesiology

## 2017-08-23 ENCOUNTER — Other Ambulatory Visit: Payer: Self-pay

## 2017-08-23 ENCOUNTER — Ambulatory Visit (HOSPITAL_BASED_OUTPATIENT_CLINIC_OR_DEPARTMENT_OTHER)
Admission: RE | Admit: 2017-08-23 | Discharge: 2017-08-23 | Disposition: A | Discharge status: Home / Self Care | Payer: No Typology Code available for payment source | Source: Ambulatory Visit | Attending: Plastic Surgery | Admitting: Plastic Surgery

## 2017-08-23 DIAGNOSIS — M549 Dorsalgia, unspecified: Secondary | ICD-10-CM | POA: Diagnosis not present

## 2017-08-23 DIAGNOSIS — M542 Cervicalgia: Secondary | ICD-10-CM | POA: Insufficient documentation

## 2017-08-23 DIAGNOSIS — N62 Hypertrophy of breast: Secondary | ICD-10-CM | POA: Insufficient documentation

## 2017-08-23 DIAGNOSIS — G8929 Other chronic pain: Secondary | ICD-10-CM | POA: Insufficient documentation

## 2017-08-23 DIAGNOSIS — L304 Erythema intertrigo: Secondary | ICD-10-CM | POA: Diagnosis not present

## 2017-08-23 DIAGNOSIS — D0512 Intraductal carcinoma in situ of left breast: Secondary | ICD-10-CM | POA: Insufficient documentation

## 2017-08-23 DIAGNOSIS — N6011 Diffuse cystic mastopathy of right breast: Secondary | ICD-10-CM | POA: Diagnosis not present

## 2017-08-23 DIAGNOSIS — Z170 Estrogen receptor positive status [ER+]: Secondary | ICD-10-CM | POA: Diagnosis not present

## 2017-08-23 HISTORY — PX: BREAST REDUCTION SURGERY: SHX8

## 2017-08-23 SURGERY — MAMMOPLASTY, REDUCTION
Anesthesia: General | Site: Breast | Laterality: Bilateral

## 2017-08-23 MED ORDER — BUPIVACAINE LIPOSOME 1.3 % IJ SUSP
INTRAMUSCULAR | Status: AC
Start: 2017-08-23 — End: 2017-08-23
  Filled 2017-08-23: qty 20

## 2017-08-23 MED ORDER — MIDAZOLAM HCL 2 MG/2ML IJ SOLN
1.0000 mg | INTRAMUSCULAR | Status: DC | PRN
  Administered 2017-08-23: 2 mg via INTRAVENOUS

## 2017-08-23 MED ORDER — HYDROMORPHONE HCL 1 MG/ML IJ SOLN
INTRAMUSCULAR | Status: AC
  Filled 2017-08-23: qty 0.5

## 2017-08-23 MED ORDER — HEPARIN SODIUM (PORCINE) 5000 UNIT/ML IJ SOLN
5000.0000 [IU] | Freq: Once | INTRAMUSCULAR | Status: AC
  Administered 2017-08-23: 5000 [IU] via SUBCUTANEOUS

## 2017-08-23 MED ORDER — FENTANYL CITRATE (PF) 100 MCG/2ML IJ SOLN
INTRAMUSCULAR | Status: AC
  Filled 2017-08-23: qty 2

## 2017-08-23 MED ORDER — ACETAMINOPHEN 500 MG PO TABS
1000.0000 mg | ORAL_TABLET | ORAL | Status: AC
  Administered 2017-08-23: 1000 mg via ORAL

## 2017-08-23 MED ORDER — ROCURONIUM BROMIDE 10 MG/ML (PF) SYRINGE
PREFILLED_SYRINGE | INTRAVENOUS | Status: AC
  Filled 2017-08-23: qty 5

## 2017-08-23 MED ORDER — LIDOCAINE HCL (CARDIAC) 20 MG/ML IV SOLN
INTRAVENOUS | Status: AC
  Filled 2017-08-23: qty 5

## 2017-08-23 MED ORDER — CEFAZOLIN SODIUM-DEXTROSE 2-4 GM/100ML-% IV SOLN
INTRAVENOUS | Status: AC
  Filled 2017-08-23: qty 100

## 2017-08-23 MED ORDER — PROPOFOL 10 MG/ML IV BOLUS
INTRAVENOUS | Status: DC | PRN
  Administered 2017-08-23: 150 mg via INTRAVENOUS
  Administered 2017-08-23: 50 mg via INTRAVENOUS

## 2017-08-23 MED ORDER — OXYCODONE HCL 5 MG PO TABS
ORAL_TABLET | ORAL | Status: AC
  Filled 2017-08-23: qty 1

## 2017-08-23 MED ORDER — SUGAMMADEX SODIUM 200 MG/2ML IV SOLN
INTRAVENOUS | Status: DC | PRN
  Administered 2017-08-23: 200 mg via INTRAVENOUS

## 2017-08-23 MED ORDER — MEPERIDINE HCL 25 MG/ML IJ SOLN: 6.2500 mg | INTRAMUSCULAR | Status: DC | PRN

## 2017-08-23 MED ORDER — SCOPOLAMINE 1 MG/3DAYS TD PT72
1.0000 | MEDICATED_PATCH | Freq: Once | TRANSDERMAL | Status: AC | PRN
  Administered 2017-08-23: 1 via TRANSDERMAL

## 2017-08-23 MED ORDER — DEXAMETHASONE SODIUM PHOSPHATE 10 MG/ML IJ SOLN
INTRAMUSCULAR | Status: AC
  Filled 2017-08-23: qty 1

## 2017-08-23 MED ORDER — DEXAMETHASONE SODIUM PHOSPHATE 4 MG/ML IJ SOLN
INTRAMUSCULAR | Status: DC | PRN
  Administered 2017-08-23: 10 mg via INTRAVENOUS

## 2017-08-23 MED ORDER — HYDROMORPHONE HCL 1 MG/ML IJ SOLN
0.2500 mg | INTRAMUSCULAR | Status: DC | PRN
  Administered 2017-08-23 (×2): 0.5 mg via INTRAVENOUS

## 2017-08-23 MED ORDER — ACETAMINOPHEN 500 MG PO TABS
ORAL_TABLET | ORAL | Status: AC
  Filled 2017-08-23: qty 2

## 2017-08-23 MED ORDER — CELECOXIB 200 MG PO CAPS
ORAL_CAPSULE | ORAL | Status: AC
  Filled 2017-08-23: qty 1

## 2017-08-23 MED ORDER — CEFAZOLIN SODIUM-DEXTROSE 2-4 GM/100ML-% IV SOLN
2.0000 g | INTRAVENOUS | Status: AC
  Administered 2017-08-23: 2 g via INTRAVENOUS

## 2017-08-23 MED ORDER — KETOROLAC TROMETHAMINE 30 MG/ML IJ SOLN
INTRAMUSCULAR | Status: DC | PRN
  Administered 2017-08-23: 30 mg via INTRAVENOUS

## 2017-08-23 MED ORDER — EPHEDRINE 5 MG/ML INJ
INTRAVENOUS | Status: AC
  Filled 2017-08-23: qty 10

## 2017-08-23 MED ORDER — OXYCODONE HCL 5 MG PO TABS: 5.0000 mg | ORAL_TABLET | ORAL | 0 refills | Status: DC | PRN

## 2017-08-23 MED ORDER — LACTATED RINGERS IV SOLN: INTRAVENOUS | Status: DC

## 2017-08-23 MED ORDER — KETOROLAC TROMETHAMINE 30 MG/ML IJ SOLN
INTRAMUSCULAR | Status: AC
  Filled 2017-08-23: qty 1

## 2017-08-23 MED ORDER — CHLORHEXIDINE GLUCONATE CLOTH 2 % EX PADS: 6.0000 | MEDICATED_PAD | Freq: Once | CUTANEOUS | Status: DC

## 2017-08-23 MED ORDER — PROMETHAZINE HCL 25 MG/ML IJ SOLN: 6.2500 mg | INTRAMUSCULAR | Status: DC | PRN

## 2017-08-23 MED ORDER — GABAPENTIN 300 MG PO CAPS
300.0000 mg | ORAL_CAPSULE | ORAL | Status: AC
  Administered 2017-08-23: 300 mg via ORAL

## 2017-08-23 MED ORDER — LACTATED RINGERS IV SOLN
INTRAVENOUS | Status: DC
  Administered 2017-08-23: 07:00:00 via INTRAVENOUS

## 2017-08-23 MED ORDER — GABAPENTIN 300 MG PO CAPS
ORAL_CAPSULE | ORAL | Status: AC
  Filled 2017-08-23: qty 1

## 2017-08-23 MED ORDER — ROCURONIUM BROMIDE 100 MG/10ML IV SOLN
INTRAVENOUS | Status: DC | PRN
  Administered 2017-08-23: 20 mg via INTRAVENOUS
  Administered 2017-08-23: 60 mg via INTRAVENOUS
  Administered 2017-08-23: 10 mg via INTRAVENOUS

## 2017-08-23 MED ORDER — PROPOFOL 500 MG/50ML IV EMUL
INTRAVENOUS | Status: AC
  Filled 2017-08-23: qty 50

## 2017-08-23 MED ORDER — SUGAMMADEX SODIUM 200 MG/2ML IV SOLN
INTRAVENOUS | Status: AC
  Filled 2017-08-23: qty 2

## 2017-08-23 MED ORDER — SCOPOLAMINE 1 MG/3DAYS TD PT72
MEDICATED_PATCH | TRANSDERMAL | Status: AC
  Filled 2017-08-23: qty 1

## 2017-08-23 MED ORDER — EPHEDRINE SULFATE 50 MG/ML IJ SOLN
INTRAMUSCULAR | Status: DC | PRN
  Administered 2017-08-23 (×5): 10 mg via INTRAVENOUS

## 2017-08-23 MED ORDER — HYDROMORPHONE HCL 1 MG/ML IJ SOLN: 0.2500 mg | INTRAMUSCULAR | Status: DC | PRN

## 2017-08-23 MED ORDER — OXYCODONE HCL 5 MG/5ML PO SOLN: 5.0000 mg | Freq: Once | ORAL | Status: DC | PRN

## 2017-08-23 MED ORDER — ONDANSETRON HCL 4 MG/2ML IJ SOLN
INTRAMUSCULAR | Status: DC | PRN
  Administered 2017-08-23: 4 mg via INTRAVENOUS

## 2017-08-23 MED ORDER — SODIUM CHLORIDE 0.9 % IJ SOLN
INTRAMUSCULAR | Status: AC
  Filled 2017-08-23: qty 20

## 2017-08-23 MED ORDER — ONDANSETRON HCL 4 MG/2ML IJ SOLN
INTRAMUSCULAR | Status: AC
  Filled 2017-08-23: qty 2

## 2017-08-23 MED ORDER — LIDOCAINE HCL (CARDIAC) 20 MG/ML IV SOLN
INTRAVENOUS | Status: DC | PRN
  Administered 2017-08-23: 50 mg via INTRAVENOUS

## 2017-08-23 MED ORDER — FENTANYL CITRATE (PF) 100 MCG/2ML IJ SOLN
50.0000 ug | INTRAMUSCULAR | Status: AC | PRN
  Administered 2017-08-23: 50 ug via INTRAVENOUS
  Administered 2017-08-23: 25 ug via INTRAVENOUS
  Administered 2017-08-23: 50 ug via INTRAVENOUS
  Administered 2017-08-23: 25 ug via INTRAVENOUS
  Administered 2017-08-23: 100 ug via INTRAVENOUS

## 2017-08-23 MED ORDER — 0.9 % SODIUM CHLORIDE (POUR BTL) OPTIME
TOPICAL | Status: DC | PRN
  Administered 2017-08-23: 1000 mL

## 2017-08-23 MED ORDER — HEPARIN SODIUM (PORCINE) 5000 UNIT/ML IJ SOLN
INTRAMUSCULAR | Status: AC
Start: 2017-08-23 — End: 2017-08-23
  Filled 2017-08-23: qty 1

## 2017-08-23 MED ORDER — SODIUM CHLORIDE 0.9 % IV SOLN
INTRAVENOUS | Status: DC | PRN
  Administered 2017-08-23: 40 mL

## 2017-08-23 MED ORDER — CELECOXIB 200 MG PO CAPS
200.0000 mg | ORAL_CAPSULE | ORAL | Status: AC
  Administered 2017-08-23: 200 mg via ORAL

## 2017-08-23 MED ORDER — MIDAZOLAM HCL 2 MG/2ML IJ SOLN
INTRAMUSCULAR | Status: AC
  Filled 2017-08-23: qty 2

## 2017-08-23 MED ORDER — ONDANSETRON HCL 4 MG/2ML IJ SOLN: 4.0000 mg | Freq: Once | INTRAMUSCULAR | Status: DC | PRN

## 2017-08-23 MED ORDER — OXYCODONE HCL 5 MG PO TABS: 5.0000 mg | ORAL_TABLET | Freq: Once | ORAL | Status: DC | PRN

## 2017-08-23 SURGICAL SUPPLY — 58 items
ADH SKN CLS APL DERMABOND .7 (GAUZE/BANDAGES/DRESSINGS) ×4
APPLIER CLIP 9.375 MED OPEN (MISCELLANEOUS)
APR CLP MED 9.3 20 MLT OPN (MISCELLANEOUS)
BINDER BREAST 3XL (GAUZE/BANDAGES/DRESSINGS) IMPLANT
BINDER BREAST LRG (GAUZE/BANDAGES/DRESSINGS) IMPLANT
BINDER BREAST MEDIUM (GAUZE/BANDAGES/DRESSINGS) IMPLANT
BINDER BREAST XLRG (GAUZE/BANDAGES/DRESSINGS) IMPLANT
BINDER BREAST XXLRG (GAUZE/BANDAGES/DRESSINGS) ×1 IMPLANT
BLADE SURG 10 STRL SS (BLADE) ×8 IMPLANT
BNDG GAUZE ELAST 4 BULKY (GAUZE/BANDAGES/DRESSINGS) ×4 IMPLANT
CANISTER SUCT 1200ML W/VALVE (MISCELLANEOUS) ×2 IMPLANT
CHLORAPREP W/TINT 26ML (MISCELLANEOUS) ×3 IMPLANT
CLIP APPLIE 9.375 MED OPEN (MISCELLANEOUS) IMPLANT
CLIP VESOCCLUDE MED 6/CT (CLIP) ×1 IMPLANT
COVER BACK TABLE 60X90IN (DRAPES) ×2 IMPLANT
COVER MAYO STAND STRL (DRAPES) ×2 IMPLANT
DERMABOND ADVANCED (GAUZE/BANDAGES/DRESSINGS) ×4
DERMABOND ADVANCED .7 DNX12 (GAUZE/BANDAGES/DRESSINGS) ×1 IMPLANT
DRAIN CHANNEL 15F RND FF W/TCR (WOUND CARE) ×2 IMPLANT
DRAPE TOP ARMCOVERS (MISCELLANEOUS) ×2 IMPLANT
DRAPE U-SHAPE 76X120 STRL (DRAPES) ×2 IMPLANT
DRSG PAD ABDOMINAL 8X10 ST (GAUZE/BANDAGES/DRESSINGS) ×5 IMPLANT
ELECT COATED BLADE 2.86 ST (ELECTRODE) ×2 IMPLANT
ELECT REM PT RETURN 9FT ADLT (ELECTROSURGICAL) ×2
ELECTRODE REM PT RTRN 9FT ADLT (ELECTROSURGICAL) ×1 IMPLANT
EVACUATOR SILICONE 100CC (DRAIN) ×2 IMPLANT
GLOVE BIO SURGEON STRL SZ 6 (GLOVE) ×5 IMPLANT
GLOVE BIO SURGEON STRL SZ 6.5 (GLOVE) ×2 IMPLANT
GLOVE BIOGEL PI IND STRL 7.0 (GLOVE) IMPLANT
GLOVE BIOGEL PI INDICATOR 7.0 (GLOVE) ×4
GLOVE ECLIPSE 6.5 STRL STRAW (GLOVE) ×4 IMPLANT
GOWN STRL REUS W/ TWL LRG LVL3 (GOWN DISPOSABLE) ×2 IMPLANT
GOWN STRL REUS W/TWL LRG LVL3 (GOWN DISPOSABLE) ×8
MARKER SKIN DUAL TIP RULER LAB (MISCELLANEOUS) IMPLANT
NDL HYPO 25X1 1.5 SAFETY (NEEDLE) IMPLANT
NEEDLE HYPO 25X1 1.5 SAFETY (NEEDLE) ×2 IMPLANT
NS IRRIG 1000ML POUR BTL (IV SOLUTION) ×2 IMPLANT
PACK BASIN DAY SURGERY FS (CUSTOM PROCEDURE TRAY) ×2 IMPLANT
PENCIL BUTTON HOLSTER BLD 10FT (ELECTRODE) ×2 IMPLANT
PIN SAFETY STERILE (MISCELLANEOUS) ×2 IMPLANT
SHEET MEDIUM DRAPE 40X70 STRL (DRAPES) ×2 IMPLANT
SLEEVE SCD COMPRESS KNEE MED (MISCELLANEOUS) ×2 IMPLANT
SPONGE LAP 18X18 RF (DISPOSABLE) ×9 IMPLANT
STAPLER VISISTAT 35W (STAPLE) ×4 IMPLANT
SUT ETHILON 2 0 FS 18 (SUTURE) ×4 IMPLANT
SUT MNCRL AB 4-0 PS2 18 (SUTURE) ×8 IMPLANT
SUT PDS AB 2-0 CT2 27 (SUTURE) IMPLANT
SUT VIC AB 3-0 PS1 18 (SUTURE) ×14
SUT VIC AB 3-0 PS1 18XBRD (SUTURE) ×4 IMPLANT
SUT VICRYL 4-0 PS2 18IN ABS (SUTURE) ×4 IMPLANT
SYR BULB IRRIGATION 50ML (SYRINGE) ×2 IMPLANT
SYR CONTROL 10ML LL (SYRINGE) ×2 IMPLANT
TAPE MEASURE VINYL STERILE (MISCELLANEOUS) IMPLANT
TOWEL OR 17X24 6PK STRL BLUE (TOWEL DISPOSABLE) ×4 IMPLANT
TRAY FOLEY BAG SILVER LF 14FR (SET/KITS/TRAYS/PACK) IMPLANT
TUBE CONNECTING 20X1/4 (TUBING) ×2 IMPLANT
UNDERPAD 30X30 (UNDERPADS AND DIAPERS) ×4 IMPLANT
YANKAUER SUCT BULB TIP NO VENT (SUCTIONS) ×2 IMPLANT

## 2017-08-23 NOTE — Anesthesia Postprocedure Evaluation (Signed)
Anesthesia Post Note  Patient: CARNITA GOLOB  Procedure(s) Performed: LEFT ONCOPLASTIC REDUCTION, RIGHT BREAST REDUCTION (Bilateral Breast)     Patient location during evaluation: PACU Anesthesia Type: General Level of consciousness: awake and alert Pain management: pain level controlled Vital Signs Assessment: post-procedure vital signs reviewed and stable Respiratory status: spontaneous breathing, nonlabored ventilation, respiratory function stable and patient connected to nasal cannula oxygen Cardiovascular status: blood pressure returned to baseline and stable Postop Assessment: no apparent nausea or vomiting Anesthetic complications: no    Last Vitals:  Vitals:   08/23/17 1215 08/23/17 1300  BP: 111/69 114/69  Pulse: 94 (!) 103  Resp: 16 18  Temp:  36.7 C  SpO2: 93% 97%    Last Pain:  Vitals:   08/23/17 1300  TempSrc:   PainSc: 0-No pain                 Effie Berkshire

## 2017-08-23 NOTE — Anesthesia Preprocedure Evaluation (Addendum)
Anesthesia Evaluation  Patient identified by MRN, date of birth, ID band Patient awake    Reviewed: Allergy & Precautions, NPO status , Patient's Chart, lab work & pertinent test results  Airway Mallampati: I  TM Distance: >3 FB Neck ROM: Full    Dental  (+) Teeth Intact, Dental Advisory Given   Pulmonary former smoker,    breath sounds clear to auscultation       Cardiovascular negative cardio ROS   Rhythm:Regular Rate:Normal     Neuro/Psych Anxiety negative neurological ROS     GI/Hepatic negative GI ROS, Neg liver ROS,   Endo/Other  negative endocrine ROS  Renal/GU negative Renal ROS     Musculoskeletal negative musculoskeletal ROS (+)   Abdominal Normal abdominal exam  (+)   Peds  Hematology negative hematology ROS (+)   Anesthesia Other Findings   Reproductive/Obstetrics                            Anesthesia Physical Anesthesia Plan  ASA: II  Anesthesia Plan: General   Post-op Pain Management:    Induction: Intravenous  PONV Risk Score and Plan: 4 or greater and Ondansetron, Dexamethasone, Midazolam and Scopolamine patch - Pre-op  Airway Management Planned: Oral ETT  Additional Equipment: None  Intra-op Plan:   Post-operative Plan: Extubation in OR  Informed Consent: I have reviewed the patients History and Physical, chart, labs and discussed the procedure including the risks, benefits and alternatives for the proposed anesthesia with the patient or authorized representative who has indicated his/her understanding and acceptance.   Dental advisory given  Plan Discussed with: CRNA  Anesthesia Plan Comments:        Anesthesia Quick Evaluation

## 2017-08-23 NOTE — Op Note (Addendum)
First Assist Op Note: [facility] I assisted the Surgeon(s) ____Dr. Arnoldo Hooker Thimmappa___ on the procedure(s): _Left oncoplastic reduction and right breast reduction___on Date ___3/29/19______  I provided my assistance on this case as follows:  I was present and acted as first Environmental consultant during this operation. I was present during the patient transport into the operative suite and assisted the OR staff with transferring and positioning of the patient. All extremities were checked and properly cushioned and safety straps in place. I was involved in the prepping and placement of sterile drapes. A time out was performed and all information confirmed to be correct.  I first assisted during the case including retraction for exposure, assisting with closure of surgical wounds and application of sterile dressings. I provided assistance with application of post operative garments/splinting and assisted with patient transfer back to the stretcher as needed.   RAYBURN,SHAWN,PA-C Plastic Surgery [phone]

## 2017-08-23 NOTE — Interval H&P Note (Signed)
History and Physical Interval Note:  08/23/2017 6:48 AM  [name]  has presented today for surgery, with the diagnosis of LEFT BREAST CANCER  The various methods of treatment have been discussed with the patient and family. After consideration of risks, benefits and other options for treatment, the patient has consented to  Procedure(s): LEFT ONCOPLASTIC REDUCTION, RIGHT BREAST REDUCTION (Bilateral) as a surgical intervention .  The patient's history has been reviewed, patient examined, no change in status, stable for surgery.  I have reviewed the patient's chart and labs.  Questions were answered to the patient's satisfaction.     Bodhi Stenglein

## 2017-08-23 NOTE — Anesthesia Procedure Notes (Signed)
Procedure Name: Intubation Performed by: Rael Tilly W, CRNA Pre-anesthesia Checklist: Patient identified, Emergency Drugs available, Suction available and Patient being monitored Patient Re-evaluated:Patient Re-evaluated prior to induction Oxygen Delivery Method: Circle system utilized Preoxygenation: Pre-oxygenation with 100% oxygen Induction Type: IV induction Ventilation: Mask ventilation without difficulty Laryngoscope Size: Miller and 2 Grade View: Grade I Tube type: Oral Tube size: 7.0 mm Number of attempts: 1 Airway Equipment and Method: Stylet and Oral airway Placement Confirmation: ETT inserted through vocal cords under direct vision,  positive ETCO2 and breath sounds checked- equal and bilateral Secured at: 22 cm Tube secured with: Tape Dental Injury: Teeth and Oropharynx as per pre-operative assessment        

## 2017-08-23 NOTE — Transfer of Care (Signed)
Immediate Anesthesia Transfer of Care Note  Patient: [name]  Procedure(s) Performed: LEFT ONCOPLASTIC REDUCTION, RIGHT BREAST REDUCTION (Bilateral Breast)  Patient Location: PACU  Anesthesia Type:General  Level of Consciousness: awake and sedated  Airway & Oxygen Therapy: Patient Spontanous Breathing and Patient connected to face mask oxygen  Post-op Assessment: Report given to RN and Post -op Vital signs reviewed and stable  Post vital signs: Reviewed and stable  Last Vitals:  Vitals Value Taken Time  BP 111/72 08/23/2017 10:48 AM  Temp    Pulse 100 08/23/2017 10:51 AM  Resp 17 08/23/2017 10:51 AM  SpO2 100 % 08/23/2017 10:51 AM  Vitals shown include unvalidated device data.  Last Pain:  Vitals:   08/23/17 0981  TempSrc: Oral  PainSc: 0-No pain         Complications: No apparent anesthesia complications

## 2017-08-23 NOTE — Discharge Instructions (Signed)
JP Drain [employer] this sheet to all of your post-operative appointments while you have your drains.  Please measure your drains by CC's or ML's.  Make sure you drain and measure your JP Drains 2 or 3 times per day.  At the end of each day, add up totals for the left side and add up totals for the right side.    ( 9 am )     ( 3 pm )        ( 9 pm )                Date L  R  L  R  L  R  Total L/R                                                                                                                                                                                       About my Jackson-Pratt Bulb Drain  What is a Jackson-Pratt bulb? A Jackson-Pratt is a soft, round device used to collect drainage. It is connected to a long, thin drainage catheter, which is held in place by one or two small stiches near your surgical incision site. When the bulb is squeezed, it forms a vacuum, forcing the drainage to empty into the bulb.  Emptying the Jackson-Pratt bulb- To empty the bulb: 1. Release the plug on the top of the bulb. 2. Pour the bulb's contents into a measuring container which your nurse will provide. 3. Record the time emptied and amount of drainage. Empty the drain(s) as often as your     doctor or nurse recommends.  Date                  Time                    Amount (Drain 1)                 Amount (Drain  2)  _____________________________________________________________________  _____________________________________________________________________  _____________________________________________________________________  _____________________________________________________________________  _____________________________________________________________________  _____________________________________________________________________  _____________________________________________________________________  _____________________________________________________________________  Squeezing the Jackson-Pratt Bulb- To squeeze the bulb: 1. Make sure the plug at the top of the bulb is open. 2. Squeeze the bulb tightly in your fist. You will hear air squeezing from the bulb. 3. Replace the plug while the bulb is squeezed. 4. Use a safety pin to attach the bulb to your clothing. This will keep the catheter from     pulling at the bulb insertion site.  When to call your doctor- Call your doctor if:  Drain site becomes red, swollen or hot.  You  have a fever greater than 101 degrees F.  There is oozing at the drain site.  Drain falls out (apply a guaze bandage over the drain hole and secure it with tape).  Drainage increases daily not related to activity patterns. (You will usually have more drainage when you are active than when you are resting.)  Drainage has a bad odor.   Post Anesthesia Home Care Instructions  Activity: Get plenty of rest for the remainder of the day. A responsible individual must stay with you for 24 hours following the procedure.  For the next 24 hours, DO NOT: -Drive a car -Paediatric nurse -Drink alcoholic beverages -Take any medication unless instructed by your physician -Make any legal decisions or sign important papers.  Meals: Start with liquid foods such as gelatin or soup. Progress to regular foods as tolerated. Avoid greasy, spicy, heavy foods. If nausea  and/or vomiting occur, drink only clear liquids until the nausea and/or vomiting subsides. Call your physician if vomiting continues.  Special Instructions/Symptoms: Your throat may feel dry or sore from the anesthesia or the breathing tube placed in your throat during surgery. If this causes discomfort, gargle with warm salt water. The discomfort should disappear within 24 hours.  If you had a scopolamine patch placed behind your ear for the management of post- operative nausea and/or vomiting:  1. The medication in the patch is effective for 72 hours, after which it should be removed.  Wrap patch in a tissue and discard in the trash. Wash hands thoroughly with soap and water. 2. You may remove the patch earlier than 72 hours if you experience unpleasant side effects which may include dry mouth, dizziness or visual disturbances. 3. Avoid touching the patch. Wash your hands with soap and water after contact with the patch.

## 2017-08-23 NOTE — Op Note (Signed)
Operative Note   DATE OF OPERATION: 3.29.19  LOCATION: [facility]-outpatient  SURGICAL DIVISION: Plastic Surgery  PREOPERATIVE DIAGNOSES:  1. Left breast cancer UIQ ER+ 2. Macromastia 3. Chronic neck and back pain  POSTOPERATIVE DIAGNOSES:  same  PROCEDURE:  Left oncoplastic reduction, right breast reduction  SURGEON: Irene Limbo MD MBA  ASSISTANT: S. Rayburn PA-C  ANESTHESIA:  General.   EBL: 50 ml  COMPLICATIONS: None immediate.   INDICATIONS FOR PROCEDURE:  The patient, [name], is a 37 y.o. female born on [birth year], is here for staged oncoplastic reduction following left lumpectomy with SLN.   FINDINGS: Right reduction 534 g left reduction 387 g plus left reduction:anterior lumpectomy cavity 69 g for total left 456 g  DESCRIPTION OF PROCEDURE:  The patient was marked standing in the preoperative area to mark sternal notch, chest midline, anterior axillary lines, inframammary folds. The location of new nipple areolar complex was marked at level of on inframammary fold on anterior surface breast by palpation. This was marked symmetric over bilateral breasts. With aid of Wise pattern marker, location of new nipple areolar complex and vertical limbs (8cm) were marked. The patient was taken to the operating room. SCDs were placed and IV antibiotics were given.The patient's operative site was prepped and draped in a sterile fashion. A time out was performed and all information was confirmed to be correct.   Over left breast, superomedial pedicle marked and nipple areolar complex marked with50 mm diameter marker. Pedicle deepithlialized and developedto chest wall.Seroma noted within lumpectomy cavity. Lower pole breast tissue excised.Additional superior pole and upper outer quadrant breast tissue excised that included the anterior lumpectomy cavity. This was sent as separate specimen and new margins cavity marked with clips. Medial and lateral flaps developed.  Breast tailor tacked closed.  I then directed attention to right breast where superomedial pedicle designed. Nipple areolar complex marked with50 mm diameter marker. The pedicle was deepithelialized and developed to 5-6 cm thickness and toward chest wall until tension free closure obtained. Lower polebreast tissue, lateral chest wall, and superior pole breast tissueexcised. Breast tailor tacked closed, and patient brought to upright sitting position and assessed for symmetry. Patent returned to supine position and breast cavities irrigated and hemostasis obtained. Exparel infiltrated throughout each breast. 15 Fr JP placed in each breast and secured with 2-0 nylon. Closure completed with 3-0 vicryl to approximate dermis along inframammary fold and vertical limb. NAC inset with 4-0 vicryl in dermis. Skin closure completed with 4-0 monocryl subcuticular throughout.Tissue adhesive applied. Dry dressing and breast binder applied  The patient was allowed to wake from anesthesia, extubated and taken to the recovery room in satisfactory condition.   SPECIMENS: 1. Right breast reduction 2. Left breast reduction 3. Left breast reduction: anterior lumpectomy cavity  DRAINS: 15 Fr JP in right and left breast  Irene Limbo, MD [facility] Re[facility] [phone], pin [phone]

## 2017-08-26 ENCOUNTER — Encounter (HOSPITAL_BASED_OUTPATIENT_CLINIC_OR_DEPARTMENT_OTHER): Payer: Self-pay | Admitting: Plastic Surgery

## 2017-08-27 ENCOUNTER — Inpatient Hospital Stay: Payer: 59 | Attending: Hematology and Oncology | Admitting: Hematology and Oncology

## 2017-08-27 ENCOUNTER — Telehealth: Payer: Self-pay | Admitting: Hematology and Oncology

## 2017-08-27 ENCOUNTER — Encounter: Payer: Self-pay | Admitting: *Deleted

## 2017-08-27 ENCOUNTER — Other Ambulatory Visit: Payer: Self-pay | Admitting: Hematology and Oncology

## 2017-08-27 ENCOUNTER — Other Ambulatory Visit: Payer: Self-pay | Admitting: *Deleted

## 2017-08-27 DIAGNOSIS — C50212 Malignant neoplasm of upper-inner quadrant of left female breast: Secondary | ICD-10-CM

## 2017-08-27 DIAGNOSIS — Z170 Estrogen receptor positive status [ER+]: Secondary | ICD-10-CM | POA: Diagnosis not present

## 2017-08-27 DIAGNOSIS — Z803 Family history of malignant neoplasm of breast: Secondary | ICD-10-CM | POA: Insufficient documentation

## 2017-08-27 DIAGNOSIS — Z800 Family history of malignant neoplasm of digestive organs: Secondary | ICD-10-CM | POA: Insufficient documentation

## 2017-08-27 DIAGNOSIS — Z87891 Personal history of nicotine dependence: Secondary | ICD-10-CM | POA: Diagnosis not present

## 2017-08-27 DIAGNOSIS — Z5111 Encounter for antineoplastic chemotherapy: Secondary | ICD-10-CM | POA: Insufficient documentation

## 2017-08-27 MED ORDER — PROCHLORPERAZINE MALEATE 10 MG PO TABS
10.0000 mg | ORAL_TABLET | Freq: Four times a day (QID) | ORAL | 1 refills | Status: DC | PRN
Start: 2017-08-27 — End: 2017-11-08

## 2017-08-27 MED ORDER — DEXAMETHASONE 4 MG PO TABS: ORAL_TABLET | ORAL | 0 refills | Status: DC

## 2017-08-27 MED ORDER — LORAZEPAM 0.5 MG PO TABS: 0.5000 mg | ORAL_TABLET | Freq: Every evening | ORAL | 0 refills | Status: DC | PRN

## 2017-08-27 MED ORDER — LIDOCAINE-PRILOCAINE 2.5-2.5 % EX CREA: TOPICAL_CREAM | CUTANEOUS | 3 refills | Status: DC

## 2017-08-27 MED ORDER — ONDANSETRON HCL 8 MG PO TABS: 8.0000 mg | ORAL_TABLET | Freq: Two times a day (BID) | ORAL | 1 refills | Status: DC | PRN

## 2017-08-27 NOTE — Telephone Encounter (Signed)
Gave patient AVS and calendar of upcoming April and may appointments.  °

## 2017-08-27 NOTE — Progress Notes (Signed)
START ON PATHWAY REGIMEN - Breast   Dose-Dense AC q14 days:   A cycle is every 14 days:     Doxorubicin      Cyclophosphamide      Pegfilgrastim-xxxx   **Always confirm dose/schedule in your pharmacy ordering system**  Paclitaxel 80 mg/m2 Weekly:   Administer weekly:     Paclitaxel   **Always confirm dose/schedule in your pharmacy ordering system**  Patient Characteristics: Postoperative without Neoadjuvant Therapy (Pathologic Staging), Invasive Disease, Adjuvant Therapy, HER2 Negative/Unknown/Equivocal, ER Positive, Node Positive, Node Positive (1-3), MammaPrint(R) Ordered, High Genomic Risk Therapeutic Status: Postoperative without Neoadjuvant Therapy (Pathologic Staging) AJCC Grade: G2 AJCC N Category: pN1a AJCC M Category: cM0 ER Status: Positive (+) AJCC 8 Stage Grouping: IB HER2 Status: Negative (-) Oncotype Dx Recurrence Score: Ordered Other Genomic Test AJCC T Category: pT2 PR Status: Positive (+) Has this patient completed genomic testing<= Yes - MammaPrint(R) MammaPrint(R) Score: High Genomic Risk Intent of Therapy: Curative Intent, Discussed with Patient 

## 2017-08-27 NOTE — Assessment & Plan Note (Addendum)
08/19/2017:Left lumpectomy: IDC grade 2, 2.5 cm, DCIS intermediate grade, lymphovascular invasion identified, margins negative, 1/2 lymph nodes positive with extracapsular extension, ER 90%, PR 40%, HER-2 negative ratio 1.15, Ki-67 15% T2 N1a stage II a; Mammaprint high risk   Pathology counseling: I discussed the final pathology report of the patient provided  a copy of this report. I discussed the margins as well as lymph node surgeries. We also discussed the final staging along with previously performed ER/PR and HER-2/neu testing.  Recommendation: 1.  Adjuvant chemotherapy with dose dense Adriamycin and Cytoxan x4 followed by Taxol weekly x12 2. followed by adjuvant radiation therapy 3.  Followed by adjuvant antiestrogen therapy   Chemotherapy Counseling: I discussed the risks and benefits of chemotherapy including the risks of nausea/ vomiting, risk of infection from low WBC count, fatigue due to chemo or anemia, bruising or bleeding due to low platelets, mouth sores, loss/ change in taste and decreased appetite. Liver and kidney function will be monitored through out chemotherapy as abnormalities in liver and kidney function may be a side effect of treatment. Cardiac dysfunction due to Adriamycin was discussed in detail. Risk of permanent bone marrow dysfunction and leukemia due to chemo were also discussed.  Plan: 1.  Echocardiogram 2. Chemo class 3.  Port placement  UPBEAT clinical trial ([id number]): Newly diagnosed stage I to III breast cancer patients receiving either adjuvant or neoadjuvant chemotherapy undergo cardiac MRI before treatment and at 24 months along with neurocognitive testing, exercise and disability measures at baseline 3, 12 and 24 months.  Return to clinic in 2 weeks to start chemotherapy.

## 2017-08-27 NOTE — Progress Notes (Signed)
Patient Care Team: Jonathon Jordan, MD as PCP - General (Family Medicine)  DIAGNOSIS:  Encounter Diagnosis  Name Primary?  . Malignant neoplasm of upper-inner quadrant of left breast in female, estrogen receptor positive ([place])     SUMMARY OF ONCOLOGIC HISTORY:   Malignant neoplasm of upper-inner quadrant of left breast in female, estrogen receptor positive ([place])   07/24/2017 Initial Diagnosis    Left breast palpable lump upper inner quadrant 11 o'clock position: Ill-defined 1.6 cm mass, axilla ultrasound negative, ultrasound-guided biopsy: Grade 2 IDC ER 90%, PR 40%, Ki-67 15%, HER-2 negative      08/09/2017 Genetic Testing    Negative genetic testing common hereditary cancer panel.  The Hereditary Gene Panel offered by [facility] includes sequencing and/or deletion duplication testing of the following 47 genes: APC, ATM, AXIN2, BARD1, BMPR1A, BRCA1, BRCA2, BRIP1, CDH1, CDK4, CDKN2A (p14ARF), CDKN2A (p16INK4a), CHEK2, CTNNA1, DICER1, EPCAM (Deletion/duplication testing only), GREM1 (promoter region deletion/duplication testing only), KIT, MEN1, MLH1, MSH2, MSH3, MSH6, MUTYH, NBN, NF1, NHTL1, PALB2, PDGFRA, PMS2, POLD1, POLE, PTEN, RAD50, RAD51C, RAD51D, SDHB, SDHC, SDHD, SMAD4, SMARCA4. STK11, TP53, TSC1, TSC2, and VHL.  The following genes were evaluated for sequence changes only: SDHA and HOXB13 c.251G>A variant only. The report date is August 07, 2017.      08/16/2017 Surgery    Left lumpectomy: IDC grade 2, 2.5 cm, DCIS intermediate grade, lymphovascular invasion identified, margins negative, 1/2 lymph nodes positive with extracapsular extension, ER 90%, PR 40%, HER-2 negative ratio 1.15, Ki-67 15% T2 N1a stage II a; Mammaprint high risk        CHIEF COMPLIANT: Patient is here to discuss the results of the surgery and Mammaprint  INTERVAL HISTORY: [name] is a 37 year old with above-mentioned history of left breast cancer who underwent lumpectomy and the pathology was sent for  Mammaprint.  She is here today to discuss the results of both of these.  She is recovering very well from the previous surgery.  I called her yesterday and gave her the results on the telephone.  She is here to discuss the final treatment plan.  REVIEW OF SYSTEMS:   Constitutional: Denies fevers, chills or abnormal weight loss Eyes: Denies blurriness of vision Ears, nose, mouth, throat, and face: Denies mucositis or sore throat Respiratory: Denies cough, dyspnea or wheezes Cardiovascular: Denies palpitation, chest discomfort Gastrointestinal:  Denies nausea, heartburn or change in bowel habits Skin: Denies abnormal skin rashes Lymphatics: Denies new lymphadenopathy or easy bruising Neurological:Denies numbness, tingling or new weaknesses Behavioral/Psych: Mood is stable, no new changes  Extremities: No lower extremity edema Breast: Left lumpectomy All other systems were reviewed with the patient and are negative.  I have reviewed the past medical history, past surgical history, social history and family history with the patient and they are unchanged from previous note.  ALLERGIES:  has No Known Allergies.  MEDICATIONS:  Current Outpatient Medications  Medication Sig Dispense Refill  . escitalopram (LEXAPRO) 20 MG tablet Take 20 mg by mouth at bedtime.    Marland Kitchen HYDROcodone-acetaminophen (NORCO/VICODIN) 5-325 MG tablet Take 1 tablet by mouth every 6 (six) hours as needed for moderate pain or severe pain. 15 tablet 0  . ibuprofen (ADVIL,MOTRIN) 200 MG tablet Take 200 mg by mouth every 6 (six) hours as needed.    Marland Kitchen oxyCODONE (ROXICODONE) 5 MG immediate release tablet Take 1 tablet (5 mg total) by mouth every 4 (four) hours as needed. 20 tablet 0   No current facility-administered medications for this visit.  PHYSICAL EXAMINATION: ECOG PERFORMANCE STATUS: 1 - Symptomatic but completely ambulatory  Vitals:   08/27/17 0812  BP: 130/83  Pulse: 84  Resp: 17  Temp: 98.1 F (36.7 C)    SpO2: 100%   Filed Weights   08/27/17 0812  Weight: 212 lb 12.8 oz (96.5 kg)    GENERAL:alert, no distress and comfortable SKIN: skin color, texture, turgor are normal, no rashes or significant lesions EYES: normal, Conjunctiva are pink and non-injected, sclera clear OROPHARYNX:no exudate, no erythema and lips, buccal mucosa, and tongue normal  NECK: supple, thyroid normal size, non-tender, without nodularity LYMPH:  no palpable lymphadenopathy in the cervical, axillary or inguinal LUNGS: clear to auscultation and percussion with normal breathing effort HEART: regular rate & rhythm and no murmurs and no lower extremity edema ABDOMEN:abdomen soft, non-tender and normal bowel sounds MUSCULOSKELETAL:no cyanosis of digits and no clubbing  NEURO: alert & oriented x 3 with fluent speech, no focal motor/sensory deficits EXTREMITIES: No lower extremity edema  LABORATORY DATA:  I have reviewed the data as listed CMP Latest Ref Rng & Units 07/31/2017 09/02/2015 11/19/2011  Glucose 70 - 140 mg/dL 74 99 145(H)  BUN 7 - 26 mg/dL '19 13 7  '$ Creatinine 0.60 - 1.10 mg/dL 0.85 0.61 0.53  Sodium 136 - 145 mmol/L 139 135 133(L)  Potassium 3.5 - 5.1 mmol/L 4.0 4.3 4.0  Chloride 98 - 109 mmol/L 106 105 100  CO2 22 - 29 mmol/L '27 22 22  '$ Calcium 8.4 - 10.4 mg/dL 9.2 8.6(L) 9.3  Total Protein 6.4 - 8.3 g/dL 7.0 - 6.3  Total Bilirubin 0.2 - 1.2 mg/dL 0.4 - 0.2(L)  Alkaline Phos 40 - 150 U/L 79 - 68  AST 5 - 34 U/L 14 - 13  ALT 0 - 55 U/L 14 - 6    Lab Results  Component Value Date   WBC 6.0 07/31/2017   HGB 8.8 (L) 09/04/2015   HCT 36.6 07/31/2017   MCV 87.4 07/31/2017   PLT 226 07/31/2017   NEUTROABS 3.7 07/31/2017    ASSESSMENT & PLAN:  Malignant neoplasm of upper-inner quadrant of left breast in female, estrogen receptor positive ([facility]) 08/19/2017:Left lumpectomy: IDC grade 2, 2.5 cm, DCIS intermediate grade, lymphovascular invasion identified, margins negative, 1/2 lymph nodes positive with  extracapsular extension, ER 90%, PR 40%, HER-2 negative ratio 1.15, Ki-67 15% T2 N1a stage II a; Mammaprint high risk   Pathology counseling: I discussed the final pathology report of the patient provided  a copy of this report. I discussed the margins as well as lymph node surgeries. We also discussed the final staging along with previously performed ER/PR and HER-2/neu testing.  Recommendation: 1.  Adjuvant chemotherapy with dose dense Adriamycin and Cytoxan x4 followed by Taxol weekly x12 2. followed by adjuvant radiation therapy 3.  Followed by adjuvant antiestrogen therapy   Chemotherapy Counseling: I discussed the risks and benefits of chemotherapy including the risks of nausea/ vomiting, risk of infection from low WBC count, fatigue due to chemo or anemia, bruising or bleeding due to low platelets, mouth sores, loss/ change in taste and decreased appetite. Liver and kidney function will be monitored through out chemotherapy as abnormalities in liver and kidney function may be a side effect of treatment. Cardiac dysfunction due to Adriamycin was discussed in detail. Risk of permanent bone marrow dysfunction and leukemia due to chemo were also discussed.  Plan: 1.  Echocardiogram 2. Chemo class 3.  Port placement  Return to clinic in 3 weeks to  start chemotherapy 09/12/2017.  No orders of the defined types were placed in this encounter.  The patient has a good understanding of the overall plan. she agrees with it. she will call with any problems that may develop before the next visit here.   Harriette Ohara, MD 08/27/17

## 2017-08-28 ENCOUNTER — Encounter (HOSPITAL_COMMUNITY): Payer: Self-pay | Admitting: Hematology and Oncology

## 2017-08-29 ENCOUNTER — Encounter: Payer: Self-pay | Admitting: General Practice

## 2017-08-29 NOTE — Progress Notes (Signed)
[place] CSW Progress Notes  Date of session:  08/29/17 Time:  11 AM - 12 PM  CSW met w patient in office, reviewed progress.  Patient had breast reduction surgery last week, some soreness and discomfort.  Has learned she will need chemotherapy which is expected to last approx 5 months.  Processed anxiety over disruption of life due to chemotherapy.  Has heard from MD that side effects can be minimized, and has heard similar from others who have had similar treatments. However has also heard of patients w severe side effects/life impact.  Discussed using worry/anxiety as a Pharmacist, hospital, helping to prepare for possibilities.  Patient most concerned about minimizing impact on 19 year old daughter.  Thought through various ways to provide appropriate support for children.  Patient has good support from family and husband.  Is reaching out to others as well.  Finds that while she has times of severe anxiety, she has also learned how to manage these times and reduce their impact by focusing on positive expectations from MD and others.  Was given information on Cancer and Careers which has guidance for continuing to work while in treatment.  Normalized feelings of anxiety/worry as she is facing upcoming challenge of chemo.  Affirmed her obvious care and concern for her children, has put significant effort into providing supportive, nurturing environment.  Discussed ways that positive teaching about resilience can be part of family experience of cancer treatment.  Will return in one week.  Edwyna Shell, LCSW Clinical Social Worker Phone:  [phone]

## 2017-08-30 ENCOUNTER — Encounter: Payer: Self-pay | Admitting: *Deleted

## 2017-09-04 ENCOUNTER — Inpatient Hospital Stay: Payer: 59

## 2017-09-04 ENCOUNTER — Ambulatory Visit (HOSPITAL_COMMUNITY)
Admission: RE | Admit: 2017-09-04 | Discharge: 2017-09-04 | Disposition: A | Discharge status: Home / Self Care | Payer: 59 | Source: Ambulatory Visit | Attending: Hematology and Oncology | Admitting: Hematology and Oncology

## 2017-09-04 ENCOUNTER — Ambulatory Visit: Payer: Self-pay | Admitting: General Surgery

## 2017-09-04 DIAGNOSIS — Z170 Estrogen receptor positive status [ER+]: Secondary | ICD-10-CM

## 2017-09-04 DIAGNOSIS — C50212 Malignant neoplasm of upper-inner quadrant of left female breast: Secondary | ICD-10-CM | POA: Insufficient documentation

## 2017-09-04 NOTE — Progress Notes (Signed)
  Echocardiogram 2D Echocardiogram has been performed.  Bobbye Charleston 09/04/2017, 10:44 AM

## 2017-09-05 ENCOUNTER — Encounter: Payer: Self-pay | Admitting: Hematology and Oncology

## 2017-09-05 ENCOUNTER — Encounter: Payer: Self-pay | Admitting: General Practice

## 2017-09-05 NOTE — Progress Notes (Signed)
Called pt to introduce myself as her Arboriculturist and to discuss copay assistance.  Pt stated she has met her deductible so her treatment should be covered at 100%.  I also informed her of the [employer], went over what it covers and gave her the income requirement.  She stated she exceeds the income requirement so she doesn't qualify.  I will give her my card at her next visit for any questions or concerns she may have in the future.

## 2017-09-05 NOTE — Progress Notes (Signed)
[place] CSW Progress Note  Date of service:  09/05/17 Time:  11 AM - 12 PM  CSW met w patient in office for continued work on anxiety management related to cancer treatment and upcoming chemotherapy.  Information received in chemo ed class was somewhat overwhelming, triggered anxious thoughts which were difficult for patient.  Rated anxiety yesterday as "8"; normal anxiety since cancer diagnosis has been "4".  Anxiety is especially prevalent in AMs when first waking up and processing thoughts re cancer.  CSW and patient worked on Radiographer, therapeutic for infusion room, including modalities that provide grounding using all senses.  When patient can sense panic attack coming, she feels her skin tingling - usually walks to discharge energy.  Since she will be unable to use this coping strategy during infusion, CSW and patient discuss multiple other modalities including breath work, Designer, jewellery, photos/videos of children, use of movies or work as Chemical engineer.  Discussed limiting worrying or catastrophic thinking to specific time of day, allowing self to experience worry, journal.  Provided feelings wheel and exercise to assist with processing anxious negative thoughts.  Will return in one week.  Edwyna Shell, LCSW Clinical Social Worker Phone:  [phone]

## 2017-09-06 NOTE — Patient Instructions (Signed)
ZAMARA COZAD  09/06/2017   Your procedure is scheduled on: 09-11-17  Report to [facility] Main  Entrance    Report to admitting at 8:30AM    Call this number if you have problems the morning of surgery [phone]     Remember: NO SOLID FOOD AFTER MIDNIGHT THE NIGHT PRIOR TO SURGERY. NOTHING BY MOUTH EXCEPT CLEAR LIQUIDS UNTIL 3 HOURS PRIOR TO SCHEDULED SURGERY. PLEASE FINISH ENSURE DRINK PER SURGEON ORDER 3 HOURS PRIOR TO SCHEDULED SURGERY TIME WHICH NEEDS TO BE COMPLETED AT ___7:30AM______.   Take these medicines the morning of surgery with A SIP OF WATER: XANAX IF NEEDED, ESCITALOPRAM, HYDROCODONE IF NEEDED                                You may not have any metal on your body including hair pins and              piercings  Do not wear jewelry, make-up, lotions, powders or perfumes, deodorant             Do not wear nail polish.  Do not shave  48 hours prior to surgery.      Do not bring valuables to the hospital. [place].  Contacts, dentures or bridgework may not be worn into surgery.      Patients discharged the day of surgery will not be allowed to drive home.  Name and phone number of your driver:  Special Instructions: N/A              Please read over the following fact sheets you were given: _____________________________________________________________________    CLEAR LIQUID DIET   Foods Allowed                                                                     Foods Excluded  Coffee and tea, regular and decaf                             liquids that you cannot  Plain Jell-O in any flavor                                             see through such as: Fruit ices (not with fruit pulp)                                     milk, soups, orange juice  Iced Popsicles                                    All solid food Carbonated beverages, regular and diet  Cranberry, grape and apple juices Sports drinks like Gatorade Lightly seasoned clear broth or consume(fat free) Sugar, honey syrup  Sample Menu Breakfast                                Lunch                                     Supper Cranberry juice                    Beef broth                            Chicken broth Jell-O                                     Grape juice                           Apple juice Coffee or tea                        Jell-O                                      Popsicle                                                Coffee or tea                        Coffee or tea  _____________________________________________________________________  [facility] - Preparing for Surgery Before surgery, you can play an important role.  Because skin is not sterile, your skin needs to be as free of germs as possible.  You can reduce the number of germs on your skin by washing with CHG (chlorahexidine gluconate) soap before surgery.  CHG is an antiseptic cleaner which kills germs and bonds with the skin to continue killing germs even after washing. Please DO NOT use if you have an allergy to CHG or antibacterial soaps.  If your skin becomes reddened/irritated stop using the CHG and inform your nurse when you arrive at Short Stay. Do not shave (including legs and underarms) for at least 48 hours prior to the first CHG shower.  You may shave your face/neck. Please follow these instructions carefully:  1.  Shower with CHG Soap the night before surgery and the  morning of Surgery.  2.  If you choose to wash your hair, wash your hair first as usual with your  normal  shampoo.  3.  After you shampoo, rinse your hair and body thoroughly to remove the  shampoo.                           4.  Use CHG as you would any other liquid soap.  You can apply chg directly  to the skin and wash  Gently with a scrungie or clean washcloth.  5.  Apply the CHG Soap to your body ONLY  FROM THE NECK DOWN.   Do not use on face/ open                           Wound or open sores. Avoid contact with eyes, ears mouth and genitals (private parts).                       Wash face,  Genitals (private parts) with your normal soap.             6.  Wash thoroughly, paying special attention to the area where your surgery  will be performed.  7.  Thoroughly rinse your body with warm water from the neck down.  8.  DO NOT shower/wash with your normal soap after using and rinsing off  the CHG Soap.                9.  Pat yourself dry with a clean towel.            10.  Wear clean pajamas.            11.  Place clean sheets on your bed the night of your first shower and do not  sleep with pets. Day of Surgery : Do not apply any lotions/deodorants the morning of surgery.  Please wear clean clothes to the hospital/surgery center.  FAILURE TO FOLLOW THESE INSTRUCTIONS MAY RESULT IN THE CANCELLATION OF YOUR SURGERY PATIENT SIGNATURE_________________________________  NURSE SIGNATURE__________________________________  ________________________________________________________________________

## 2017-09-06 NOTE — Progress Notes (Signed)
ECHO 09-04-17 Epic

## 2017-09-09 ENCOUNTER — Encounter (HOSPITAL_COMMUNITY): Payer: Self-pay

## 2017-09-09 ENCOUNTER — Other Ambulatory Visit: Payer: Self-pay

## 2017-09-09 ENCOUNTER — Encounter (HOSPITAL_COMMUNITY)
Admission: RE | Admit: 2017-09-09 | Discharge: 2017-09-09 | Disposition: A | Discharge status: Home / Self Care | Payer: No Typology Code available for payment source | Source: Ambulatory Visit | Attending: General Surgery | Admitting: General Surgery

## 2017-09-09 DIAGNOSIS — Z170 Estrogen receptor positive status [ER+]: Secondary | ICD-10-CM | POA: Diagnosis not present

## 2017-09-09 DIAGNOSIS — C773 Secondary and unspecified malignant neoplasm of axilla and upper limb lymph nodes: Secondary | ICD-10-CM | POA: Diagnosis not present

## 2017-09-09 DIAGNOSIS — F419 Anxiety disorder, unspecified: Secondary | ICD-10-CM | POA: Diagnosis not present

## 2017-09-09 DIAGNOSIS — C50912 Malignant neoplasm of unspecified site of left female breast: Secondary | ICD-10-CM | POA: Diagnosis present

## 2017-09-09 DIAGNOSIS — K219 Gastro-esophageal reflux disease without esophagitis: Secondary | ICD-10-CM | POA: Diagnosis not present

## 2017-09-09 DIAGNOSIS — Z79899 Other long term (current) drug therapy: Secondary | ICD-10-CM | POA: Diagnosis not present

## 2017-09-09 LAB — CBC
HCT: 38.3 % (ref 36.0–46.0)
Hemoglobin: 12.6 g/dL (ref 12.0–15.0)
MCH: 29.4 pg (ref 26.0–34.0)
MCHC: 32.9 g/dL (ref 30.0–36.0)
MCV: 89.3 fL (ref 78.0–100.0)
Platelets: 287 10*3/uL (ref 150–400)
RBC: 4.29 MIL/uL (ref 3.87–5.11)
RDW: 13.1 % (ref 11.5–15.5)
WBC: 7.2 10*3/uL (ref 4.0–10.5)

## 2017-09-09 LAB — PREGNANCY, URINE: Preg Test, Ur: NEGATIVE

## 2017-09-11 ENCOUNTER — Ambulatory Visit (HOSPITAL_COMMUNITY): Payer: No Typology Code available for payment source | Admitting: Certified Registered Nurse Anesthetist

## 2017-09-11 ENCOUNTER — Ambulatory Visit (HOSPITAL_COMMUNITY)
Admission: RE | Admit: 2017-09-11 | Discharge: 2017-09-11 | Disposition: A | Discharge status: Home / Self Care | Payer: No Typology Code available for payment source | Source: Ambulatory Visit | Attending: General Surgery | Admitting: General Surgery

## 2017-09-11 ENCOUNTER — Encounter (HOSPITAL_COMMUNITY): Payer: Self-pay | Admitting: *Deleted

## 2017-09-11 ENCOUNTER — Ambulatory Visit (HOSPITAL_COMMUNITY): Payer: No Typology Code available for payment source

## 2017-09-11 ENCOUNTER — Encounter (HOSPITAL_COMMUNITY)
Admission: RE | Disposition: A | Discharge status: Home / Self Care | Payer: Self-pay | Source: Ambulatory Visit | Attending: General Surgery

## 2017-09-11 DIAGNOSIS — K219 Gastro-esophageal reflux disease without esophagitis: Secondary | ICD-10-CM | POA: Insufficient documentation

## 2017-09-11 DIAGNOSIS — Z79899 Other long term (current) drug therapy: Secondary | ICD-10-CM | POA: Insufficient documentation

## 2017-09-11 DIAGNOSIS — C50912 Malignant neoplasm of unspecified site of left female breast: Secondary | ICD-10-CM | POA: Insufficient documentation

## 2017-09-11 DIAGNOSIS — F419 Anxiety disorder, unspecified: Secondary | ICD-10-CM | POA: Insufficient documentation

## 2017-09-11 DIAGNOSIS — C773 Secondary and unspecified malignant neoplasm of axilla and upper limb lymph nodes: Secondary | ICD-10-CM | POA: Insufficient documentation

## 2017-09-11 DIAGNOSIS — Z170 Estrogen receptor positive status [ER+]: Secondary | ICD-10-CM | POA: Insufficient documentation

## 2017-09-11 DIAGNOSIS — Z95828 Presence of other vascular implants and grafts: Secondary | ICD-10-CM

## 2017-09-11 HISTORY — PX: PORTACATH PLACEMENT: SHX2246

## 2017-09-11 SURGERY — INSERTION, TUNNELED CENTRAL VENOUS DEVICE, WITH PORT
Anesthesia: General | Site: Chest | Laterality: Right

## 2017-09-11 MED ORDER — MIDAZOLAM HCL 2 MG/2ML IJ SOLN
INTRAMUSCULAR | Status: AC
  Filled 2017-09-11: qty 2

## 2017-09-11 MED ORDER — BUPIVACAINE HCL (PF) 0.25 % IJ SOLN
INTRAMUSCULAR | Status: DC | PRN
  Administered 2017-09-11: 19 mL

## 2017-09-11 MED ORDER — MIDAZOLAM HCL 5 MG/5ML IJ SOLN
INTRAMUSCULAR | Status: DC | PRN
  Administered 2017-09-11: 2 mg via INTRAVENOUS

## 2017-09-11 MED ORDER — BUPIVACAINE-EPINEPHRINE (PF) 0.5% -1:200000 IJ SOLN
INTRAMUSCULAR | Status: AC
  Filled 2017-09-11: qty 30

## 2017-09-11 MED ORDER — ACETAMINOPHEN 500 MG PO TABS
1000.0000 mg | ORAL_TABLET | ORAL | Status: AC
  Administered 2017-09-11: 1000 mg via ORAL
  Filled 2017-09-11: qty 2

## 2017-09-11 MED ORDER — SUGAMMADEX SODIUM 500 MG/5ML IV SOLN
INTRAVENOUS | Status: AC
  Filled 2017-09-11: qty 5

## 2017-09-11 MED ORDER — CELECOXIB 200 MG PO CAPS
200.0000 mg | ORAL_CAPSULE | ORAL | Status: AC
  Administered 2017-09-11: 200 mg via ORAL
  Filled 2017-09-11: qty 1

## 2017-09-11 MED ORDER — DEXAMETHASONE SODIUM PHOSPHATE 10 MG/ML IJ SOLN
INTRAMUSCULAR | Status: AC
  Filled 2017-09-11: qty 1

## 2017-09-11 MED ORDER — ONDANSETRON HCL 4 MG/2ML IJ SOLN
INTRAMUSCULAR | Status: AC
  Filled 2017-09-11: qty 2

## 2017-09-11 MED ORDER — SCOPOLAMINE 1 MG/3DAYS TD PT72
MEDICATED_PATCH | TRANSDERMAL | Status: DC | PRN
  Administered 2017-09-11: 1 via TRANSDERMAL

## 2017-09-11 MED ORDER — FENTANYL CITRATE (PF) 100 MCG/2ML IJ SOLN
INTRAMUSCULAR | Status: AC
  Filled 2017-09-11: qty 2

## 2017-09-11 MED ORDER — FENTANYL CITRATE (PF) 100 MCG/2ML IJ SOLN
INTRAMUSCULAR | Status: DC | PRN
  Administered 2017-09-11: 25 ug via INTRAVENOUS
  Administered 2017-09-11: 50 ug via INTRAVENOUS
  Administered 2017-09-11: 25 ug via INTRAVENOUS

## 2017-09-11 MED ORDER — CEFAZOLIN SODIUM-DEXTROSE 2-4 GM/100ML-% IV SOLN
2.0000 g | INTRAVENOUS | Status: AC
  Administered 2017-09-11: 2 g via INTRAVENOUS
  Filled 2017-09-11: qty 100

## 2017-09-11 MED ORDER — SCOPOLAMINE 1 MG/3DAYS TD PT72
MEDICATED_PATCH | TRANSDERMAL | Status: AC
  Filled 2017-09-11: qty 1

## 2017-09-11 MED ORDER — HEPARIN SOD (PORK) LOCK FLUSH 100 UNIT/ML IV SOLN
INTRAVENOUS | Status: AC
  Filled 2017-09-11: qty 5

## 2017-09-11 MED ORDER — LACTATED RINGERS IV SOLN
INTRAVENOUS | Status: DC
Start: 2017-09-11 — End: 2017-09-11
  Administered 2017-09-11 (×3): via INTRAVENOUS

## 2017-09-11 MED ORDER — ROCURONIUM BROMIDE 10 MG/ML (PF) SYRINGE
PREFILLED_SYRINGE | INTRAVENOUS | Status: AC
  Filled 2017-09-11: qty 5

## 2017-09-11 MED ORDER — EPHEDRINE SULFATE-NACL 50-0.9 MG/10ML-% IV SOSY
PREFILLED_SYRINGE | INTRAVENOUS | Status: DC | PRN
  Administered 2017-09-11: 15 mg via INTRAVENOUS

## 2017-09-11 MED ORDER — PHENYLEPHRINE 40 MCG/ML (10ML) SYRINGE FOR IV PUSH (FOR BLOOD PRESSURE SUPPORT)
PREFILLED_SYRINGE | INTRAVENOUS | Status: DC | PRN
  Administered 2017-09-11: 80 ug via INTRAVENOUS

## 2017-09-11 MED ORDER — LIDOCAINE 2% (20 MG/ML) 5 ML SYRINGE
INTRAMUSCULAR | Status: AC
  Filled 2017-09-11: qty 5

## 2017-09-11 MED ORDER — PROPOFOL 10 MG/ML IV BOLUS
INTRAVENOUS | Status: DC | PRN
  Administered 2017-09-11: 50 mg via INTRAVENOUS
  Administered 2017-09-11: 200 mg via INTRAVENOUS

## 2017-09-11 MED ORDER — SODIUM CHLORIDE 0.9 % IV SOLN
Freq: Once | INTRAVENOUS | Status: AC
  Administered 2017-09-11: 500 mL
  Filled 2017-09-11: qty 1.2

## 2017-09-11 MED ORDER — HEPARIN SOD (PORK) LOCK FLUSH 100 UNIT/ML IV SOLN
INTRAVENOUS | Status: DC | PRN
  Administered 2017-09-11: 500 [IU] via INTRAVENOUS

## 2017-09-11 MED ORDER — BUPIVACAINE HCL (PF) 0.5 % IJ SOLN
INTRAMUSCULAR | Status: AC
  Filled 2017-09-11: qty 30

## 2017-09-11 MED ORDER — HYDROMORPHONE HCL 1 MG/ML IJ SOLN: 0.2500 mg | INTRAMUSCULAR | Status: DC | PRN

## 2017-09-11 MED ORDER — PROPOFOL 10 MG/ML IV BOLUS
INTRAVENOUS | Status: AC
  Filled 2017-09-11: qty 20

## 2017-09-11 MED ORDER — CHLORHEXIDINE GLUCONATE CLOTH 2 % EX PADS: 6.0000 | MEDICATED_PAD | Freq: Once | CUTANEOUS | Status: DC

## 2017-09-11 MED ORDER — MEPERIDINE HCL 50 MG/ML IJ SOLN: 6.2500 mg | INTRAMUSCULAR | Status: DC | PRN

## 2017-09-11 MED ORDER — LIDOCAINE 2% (20 MG/ML) 5 ML SYRINGE
INTRAMUSCULAR | Status: DC | PRN
  Administered 2017-09-11: 100 mg via INTRAVENOUS

## 2017-09-11 MED ORDER — BUPIVACAINE HCL (PF) 0.25 % IJ SOLN
INTRAMUSCULAR | Status: AC
  Filled 2017-09-11: qty 30

## 2017-09-11 MED ORDER — DEXAMETHASONE SODIUM PHOSPHATE 10 MG/ML IJ SOLN
INTRAMUSCULAR | Status: DC | PRN
  Administered 2017-09-11: 10 mg via INTRAVENOUS

## 2017-09-11 MED ORDER — ONDANSETRON HCL 4 MG/2ML IJ SOLN
INTRAMUSCULAR | Status: DC | PRN
  Administered 2017-09-11: 4 mg via INTRAVENOUS

## 2017-09-11 MED ORDER — 0.9 % SODIUM CHLORIDE (POUR BTL) OPTIME
TOPICAL | Status: DC | PRN
  Administered 2017-09-11: 1000 mL

## 2017-09-11 MED ORDER — GABAPENTIN 300 MG PO CAPS
300.0000 mg | ORAL_CAPSULE | ORAL | Status: AC
  Administered 2017-09-11: 300 mg via ORAL
  Filled 2017-09-11: qty 1

## 2017-09-11 MED ORDER — ONDANSETRON HCL 4 MG/2ML IJ SOLN: 4.0000 mg | Freq: Once | INTRAMUSCULAR | Status: DC | PRN

## 2017-09-11 SURGICAL SUPPLY — 34 items
ADH SKN CLS APL DERMABOND .7 (GAUZE/BANDAGES/DRESSINGS) ×1
APL SKNCLS STERI-STRIP NONHPOA (GAUZE/BANDAGES/DRESSINGS)
BAG DECANTER FOR FLEXI CONT (MISCELLANEOUS) ×2 IMPLANT
BENZOIN TINCTURE PRP APPL 2/3 (GAUZE/BANDAGES/DRESSINGS) IMPLANT
BLADE SURG 15 STRL LF DISP TIS (BLADE) ×1 IMPLANT
BLADE SURG 15 STRL SS (BLADE) ×2
CHLORAPREP W/TINT 26ML (MISCELLANEOUS) ×2 IMPLANT
COVER PROBE U/S 5X48 (MISCELLANEOUS) ×1 IMPLANT
COVER SURGICAL LIGHT HANDLE (MISCELLANEOUS) ×2 IMPLANT
DECANTER SPIKE VIAL GLASS SM (MISCELLANEOUS) ×2 IMPLANT
DERMABOND ADVANCED (GAUZE/BANDAGES/DRESSINGS) ×1
DERMABOND ADVANCED .7 DNX12 (GAUZE/BANDAGES/DRESSINGS) ×1 IMPLANT
DRAPE C-ARM 42X120 X-RAY (DRAPES) ×2 IMPLANT
DRAPE LAPAROSCOPIC ABDOMINAL (DRAPES) ×2 IMPLANT
DRSG TEGADERM 4X4.75 (GAUZE/BANDAGES/DRESSINGS) ×2 IMPLANT
ELECT PENCIL ROCKER SW 15FT (MISCELLANEOUS) ×2 IMPLANT
ELECT REM PT RETURN 15FT ADLT (MISCELLANEOUS) ×2 IMPLANT
GAUZE SPONGE 4X4 12PLY STRL (GAUZE/BANDAGES/DRESSINGS) ×1 IMPLANT
GAUZE SPONGE 4X4 16PLY XRAY LF (GAUZE/BANDAGES/DRESSINGS) ×2 IMPLANT
GLOVE BIOGEL PI IND STRL 7.5 (GLOVE) ×1 IMPLANT
GLOVE BIOGEL PI INDICATOR 7.5 (GLOVE) ×1
GLOVE ECLIPSE 7.5 STRL STRAW (GLOVE) ×2 IMPLANT
GOWN STRL REUS W/TWL XL LVL3 (GOWN DISPOSABLE) ×4 IMPLANT
KIT BASIN OR (CUSTOM PROCEDURE TRAY) ×2 IMPLANT
KIT PORT POWER 8FR ISP CVUE (Port) ×1 IMPLANT
NEEDLE HYPO 22GX1.5 SAFETY (NEEDLE) ×2 IMPLANT
PACK BASIC VI WITH GOWN DISP (CUSTOM PROCEDURE TRAY) ×2 IMPLANT
STRIP CLOSURE SKIN 1/2X4 (GAUZE/BANDAGES/DRESSINGS) IMPLANT
SUT MNCRL AB 4-0 PS2 18 (SUTURE) ×2 IMPLANT
SUT PROLENE 2 0 CT2 30 (SUTURE) ×2 IMPLANT
SYR 10ML ECCENTRIC (SYRINGE) ×2 IMPLANT
SYR CONTROL 10ML LL (SYRINGE) ×2 IMPLANT
TOWEL OR 17X26 10 PK STRL BLUE (TOWEL DISPOSABLE) ×2 IMPLANT
TOWEL OR NON WOVEN STRL DISP B (DISPOSABLE) ×2 IMPLANT

## 2017-09-11 NOTE — Interval H&P Note (Signed)
History and Physical Interval Note:  09/11/2017 10:32 AM  [name]  has presented today for surgery, with the diagnosis of LEFT BREAST CANCER  The various methods of treatment have been discussed with the patient and family. After consideration of risks, benefits and other options for treatment, the patient has consented to  Procedure(s): INSERTION PORT-A-CATH (N/A) as a surgical intervention .  The patient's history has been reviewed, patient examined, no change in status, stable for surgery.  I have reviewed the patient's chart and labs.  I discussed Port-A-Cath placement with the patient and her husband in detail.  We discussed the nature of surgery and its indications.  I discussed risks of anesthetic complications, bleeding, infection and slight risks of pneumothorax or vascular injury.  Discussed long-term risks of catheter displacement or occlusion and DVT.  Questions were answered to the patient's satisfaction.     [name] Dennis Hegeman

## 2017-09-11 NOTE — Anesthesia Postprocedure Evaluation (Signed)
Anesthesia Post Note  Patient: [name]  Procedure(s) Performed: INSERTION PORT-A-CATH (Right Chest)     Patient location during evaluation: PACU Anesthesia Type: General Level of consciousness: awake and alert Pain management: pain level controlled Vital Signs Assessment: post-procedure vital signs reviewed and stable Respiratory status: spontaneous breathing, nonlabored ventilation, respiratory function stable and patient connected to nasal cannula oxygen Cardiovascular status: blood pressure returned to baseline and stable Postop Assessment: no apparent nausea or vomiting Anesthetic complications: no    Last Vitals:  Vitals:   09/11/17 1315 09/11/17 1350  BP: 138/80 137/71  Pulse: 95 85  Resp: 12 14  Temp: 36.9 C 36.9 C  SpO2: 95% 98%    Last Pain:  Vitals:   09/11/17 1350  TempSrc: Oral  PainSc: 2                  Kasra Melvin DAVID

## 2017-09-11 NOTE — H&P (Signed)
History of Present Illness  The patient is a 37 year old female with a recent diagnosis of left breast cancer.  Her original presentation was as follows:  Findings at that time were the following: Tumor size: 1.6 cm Tumor grade: 2 Estrogen Receptor: positive Progesterone Receptor: positive Her-2 neu: negative Lymph node status: negative  Patient underwent a staged procedure with initial lumpectomy and axillary sentinel lymph node biopsy subsequently followed by bilateral breast reduction.  Initial pathology showed a 2.5 cm grade 2 cancer with 1 of 2 sentinel lymph nodes positive.  She will require IV chemotherapy and presents for Port-A-Cath placement.    Past Surgical History  Cesarean Section - Multiple  Gallbladder Surgery - Laparoscopic  Oral Surgery   Diagnostic Studies History  Colonoscopy  never Mammogram  within last year Pap Smear  1-5 years ago  Medication History Medications Reconciled  Social History  Alcohol use  Occasional alcohol use. Caffeine use  Carbonated beverages. No drug use  Tobacco use  Former smoker.  Family History  Alcohol Abuse  Brother, Family Members In [place]  Mother. Diabetes Mellitus  Mother.  Pregnancy / Birth History  Age at menarche  23 years. Contraceptive History  Oral contraceptives. Gravida  2 Maternal age  87-35 Para  2 Regular periods   Other Problems  Anxiety Disorder  Cholelithiasis  Gastroesophageal Reflux Disease  Hemorrhoids     Review of Systems  General Present- Appetite Loss and Night Sweats. Not Present- Chills, Fatigue, Fever, Weight Gain and Weight Loss. Skin Present- Dryness. Not Present- Change in Wart/Mole, Hives, Jaundice, New Lesions, Non-Healing Wounds, Rash and Ulcer. HEENT Present- Wears glasses/contact lenses. Not Present- Earache, Hearing Loss, Hoarseness, Nose Bleed, Oral Ulcers, Ringing in the Ears, Seasonal Allergies, Sinus Pain, Sore Throat, Visual Disturbances  and Yellow Eyes. Breast Present- Breast Mass. Not Present- Breast Pain, Nipple Discharge and Skin Changes. Psychiatric Present- Anxiety. Not Present- Bipolar, Change in Sleep Pattern, Depression, Fearful and Frequent crying.   Physical Exam  The physical exam findings are as follows: Note:General: Alert, mildly overweight Caucasian female, in no distress Skin: Warm and dry without rash or infection. HEENT: No palpable masses or thyromegaly. Sclera nonicteric. Pupils equal round and reactive. Lymph nodes: No cervical, supraclavicular, nodes palpable. Breasts: Healing bilateral reduction incisions. Lungs: Breath sounds clear and equal. No wheezing or increased work of breathing. Cardiovascular: Regular rate and rhythm without murmer. No JVD or edema. Peripheral pulses intact. No carotid bruits. Abdomen: Nondistended. Soft and nontender. No masses palpable. Extremities: No edema or joint swelling or deformity. No chronic venous stasis changes. Neurologic: Alert and fully oriented. Gait normal. No focal weakness. Psychiatric: Normal mood and affect. 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  Status post staged procedure with lumpectomy and sentinel lymph node biopsy followed by bilateral reduction surgery.  She requires a Port-A-Cath for IV chemotherapy and presents for Port-A-Cath placement.

## 2017-09-11 NOTE — Discharge Instructions (Signed)
PORT-A-CATH: POST OP INSTRUCTIONS  Always review your discharge instruction sheet given to you by the facility where your surgery was performed.   1. A prescription for pain medication may be given to you upon discharge. Take your pain medication as prescribed, if needed. If narcotic pain medicine is not needed, then you make take acetaminophen (Tylenol) or ibuprofen (Advil) as needed.  2. Take your usually prescribed medications unless otherwise directed. 3. If you need a refill on your pain medication, please contact our office. All narcotic pain medicine now requires a paper prescription.  Phoned in and fax refills are no longer allowed by law.  Prescriptions will not be filled after 5 pm or on weekends.  4. You should follow a light diet for the remainder of the day after your procedure. 5. Most patients will experience some mild swelling and/or bruising in the area of the incision. It may take several days to resolve. 6. It is common to experience some constipation if taking pain medication after surgery. Increasing fluid intake and taking a stool softener (such as Colace) will usually help or prevent this problem from occurring. A mild laxative (Milk of Magnesia or Miralax) should be taken according to package directions if there are no bowel movements after 48 hours.  7. Unless discharge instructions indicate otherwise, you may remove your bandages 48 hours after surgery, and you may shower at that time. You may have steri-strips (small white skin tapes) in place directly over the incision.  These strips should be left on the skin for 7-10 days.  If your surgeon used Dermabond (skin glue) on the incision, you may shower in 24 hours.  The glue will flake off over the next 2-3 weeks.  8. If your port is left accessed at the end of surgery (needle left in port), the dressing cannot get wet and should only by changed by a healthcare professional. When the port is no longer accessed (when the  needle has been removed), follow step 7.   9. ACTIVITIES:  Limit activity involving your arms for the next 72 hours. Do no strenuous exercise or activity for 1 week. You may drive when you are no longer taking prescription pain medication, you can comfortably wear a seatbelt, and you can maneuver your car. 10.You may need to see your doctor in the office for a follow-up appointment.  Please       check with your doctor.  11.When you receive a new Port-a-Cath, you will get a product guide and        ID card.  Please keep them in case you need them.  WHEN TO CALL YOUR DOCTOR [phone]): 1. Fever over 101.0 2. Chills 3. Continued bleeding from incision 4. Increased redness and tenderness at the site 5. Shortness of breath, difficulty breathing   The clinic staff is available to answer your questions during regular business hours. Please dont hesitate to call and ask to speak to one of the nurses or medical assistants for clinical concerns. If you have a medical emergency, go to the nearest emergency room or call 911.  A surgeon from [facility] is always on call at the hospital.     For further information, please visit [email or web address]   General Anesthesia, Adult, Care After These instructions provide you with information about caring for yourself after your procedure. Your health care provider may also give you more specific instructions. Your treatment has been planned according to current medical practices, but problems  sometimes occur. Call your health care provider if you have any problems or questions after your procedure. What can I expect after the procedure? After the procedure, it is common to have:  Vomiting.  A sore throat.  Mental slowness.  It is common to feel:  Nauseous.  Cold or shivery.  Sleepy.  Tired.  Sore or achy, even in parts of your body where you did not have surgery.  Follow these instructions at home: For at least  24 hours after the procedure:  Do not: ? Participate in activities where you could fall or become injured. ? Drive. ? Use heavy machinery. ? Drink alcohol. ? Take sleeping pills or medicines that cause drowsiness. ? Make important decisions or sign legal documents. ? Take care of children on your own.  Rest. Eating and drinking  If you vomit, drink water, juice, or soup when you can drink without vomiting.  Drink enough fluid to keep your urine clear or pale yellow.  Make sure you have little or no nausea before eating solid foods.  Follow the diet recommended by your health care provider. General instructions  Have a responsible adult stay with you until you are awake and alert.  Return to your normal activities as told by your health care provider. Ask your health care provider what activities are safe for you.  Take over-the-counter and prescription medicines only as told by your health care provider.  If you smoke, do not smoke without supervision.  Keep all follow-up visits as told by your health care provider. This is important. Contact a health care provider if:  You continue to have nausea or vomiting at home, and medicines are not helpful.  You cannot drink fluids or start eating again.  You cannot urinate after 8-12 hours.  You develop a skin rash.  You have fever.  You have increasing redness at the site of your procedure. Get help right away if:  You have difficulty breathing.  You have chest pain.  You have unexpected bleeding.  You feel that you are having a life-threatening or urgent problem. This information is not intended to replace advice given to you by your health care provider. Make sure you discuss any questions you have with your health care provider. Document Released: 08/20/2000 Document Revised: 10/17/2015 Document Reviewed: 04/28/2015 Elsevier Interactive Patient Education  2018 [place] Insertion, Care  After This sheet gives you information about how to care for yourself after your procedure. Your health care provider may also give you more specific instructions. If you have problems or questions, contact your health care provider. What can I expect after the procedure? After your procedure, it is common to have:  Discomfort at the port insertion site.  Bruising on the skin over the port. This should improve over 3-4 days.  Follow these instructions at home: [facility] care  After your port is placed, you will get a manufacturer's information card. The card has information about your port. Keep this card with you at all times.  Take care of the port as told by your health care provider. Ask your health care provider if you or a family member can get training for taking care of the port at home. A home health care nurse may also take care of the port.  Make sure to remember what type of port you have. Incision care  Follow instructions from your health care provider about how to take care of your port insertion site. Make sure  you: ? Wash your hands with soap and water before you change your bandage (dressing). If soap and water are not available, use hand sanitizer. ? Change your dressing as told by your health care provider. ? Leave stitches (sutures), skin glue, or adhesive strips in place. These skin closures may need to stay in place for 2 weeks or longer. If adhesive strip edges start to loosen and curl up, you may trim the loose edges. Do not remove adhesive strips completely unless your health care provider tells you to do that.  Check your port insertion site every day for signs of infection. Check for: ? More redness, swelling, or pain. ? More fluid or blood. ? Warmth. ? Pus or a bad smell. General instructions  Do not take baths, swim, or use a hot tub until your health care provider approves.  Do not lift anything that is heavier than 10 lb (4.5 kg) for a week, or as told by  your health care provider.  Ask your health care provider when it is okay to: ? Return to work or school. ? Resume usual physical activities or sports.  Do not drive for 24 hours if you were given a medicine to help you relax (sedative).  Take over-the-counter and prescription medicines only as told by your health care provider.  Wear a medical alert bracelet in case of an emergency. This will tell any health care providers that you have a port.  Keep all follow-up visits as told by your health care provider. This is important. Contact a health care provider if:  You cannot flush your port with saline as directed, or you cannot draw blood from the port.  You have a fever or chills.  You have more redness, swelling, or pain around your port insertion site.  You have more fluid or blood coming from your port insertion site.  Your port insertion site feels warm to the touch.  You have pus or a bad smell coming from the port insertion site. Get help right away if:  You have chest pain or shortness of breath.  You have bleeding from your port that you cannot control. Summary  Take care of the port as told by your health care provider.  Change your dressing as told by your health care provider.  Keep all follow-up visits as told by your health care provider. This information is not intended to replace advice given to you by your health care provider. Make sure you discuss any questions you have with your health care provider. Document Released: 03/04/2013 Document Revised: 04/04/2016 Document Reviewed: 04/04/2016 Elsevier Interactive Patient Education  2017 [employer].

## 2017-09-11 NOTE — Anesthesia Procedure Notes (Signed)
Procedure Name: LMA Insertion Date/Time: 09/11/2017 10:49 AM Performed by: [place] Alec Mcphee C, CRNA Pre-anesthesia Checklist: Patient identified, Emergency Drugs available, Suction available and Patient being monitored Patient Re-evaluated:Patient Re-evaluated prior to induction Oxygen Delivery Method: Circle system utilized Preoxygenation: Pre-oxygenation with 100% oxygen Induction Type: IV induction Ventilation: Mask ventilation without difficulty LMA: LMA inserted LMA Size: 4.0 Number of attempts: 1 Airway Equipment and Method: Bite block Placement Confirmation: positive ETCO2 Tube secured with: Tape Dental Injury: Teeth and Oropharynx as per pre-operative assessment

## 2017-09-11 NOTE — Op Note (Signed)
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  Anesthesia: LMA general  Description of procedure: Patient is brought to the operating room and placed in the supine position on the operating table. LMA general anesthesia was administered. The entire upper chest and neck were widely sterilely prepped and draped. [place] was used to visualize the right IJ vein. Local anesthesia was used to infiltrate the insertion of and port sites. The right IJ  vein was cannulated under continuous ultrasound visualization with a needle and guidewire without difficulty and position in the superior vena cava was confirmed by fluoroscopy. The introducer was then placed over the guidewire and the flushed catheter placed via the introducer which was stripped away and the tip of the catheter positioned near the cavoatrial junction. Venous pressure backflow was confirmed in the catheter. A small transverse incision was made in the anterior chest wall and subcutaneous pocket created. The catheter was tunneled into the pocket, trimmed to length, and attached to the flushed port which was positioned in the pocket. The port was sutured to the chest wall with interrupted 2-0 Prolene. The incisions were closed with subcutaneous interrupted Monocryl and the skin incisions closed with subcuticular Monocryl and Dermabond. The port was accessed and flushed and aspirated easily and was left flushed with concentrated heparin solution. Sponge needle as the counts were correct. The patient was taken to recovery in good condition.  [name] [name]  09/11/2017

## 2017-09-11 NOTE — Anesthesia Preprocedure Evaluation (Signed)
Anesthesia Evaluation  Patient identified by MRN, date of birth, ID band Patient awake    Reviewed: Allergy & Precautions, NPO status , Patient's Chart, lab work & pertinent test results  Airway Mallampati: II  TM Distance: >3 FB Neck ROM: Full    Dental   Pulmonary former smoker,    Pulmonary exam normal        Cardiovascular Normal cardiovascular exam     Neuro/Psych Anxiety    GI/Hepatic   Endo/Other    Renal/GU      Musculoskeletal   Abdominal   Peds  Hematology   Anesthesia Other Findings   Reproductive/Obstetrics                             Anesthesia Physical Anesthesia Plan  ASA: II  Anesthesia Plan: General   Post-op Pain Management:    Induction: Intravenous  PONV Risk Score and Plan: 3 and Ondansetron, Dexamethasone and Midazolam  Airway Management Planned: LMA  Additional Equipment:   Intra-op Plan:   Post-operative Plan: Extubation in OR  Informed Consent: I have reviewed the patients History and Physical, chart, labs and discussed the procedure including the risks, benefits and alternatives for the proposed anesthesia with the patient or authorized representative who has indicated his/her understanding and acceptance.     Plan Discussed with: CRNA and Surgeon  Anesthesia Plan Comments:         Anesthesia Quick Evaluation

## 2017-09-11 NOTE — Transfer of Care (Signed)
Immediate Anesthesia Transfer of Care Note  Patient: [name]  Procedure(s) Performed: INSERTION PORT-A-CATH (Right Chest)  Patient Location: PACU  Anesthesia Type:General  Level of Consciousness: awake, alert  and oriented  Airway & Oxygen Therapy: Patient Spontanous Breathing and Patient connected to face mask oxygen  Post-op Assessment: Report given to RN and Post -op Vital signs reviewed and stable  Post vital signs: Reviewed and stable  Last Vitals:  Vitals Value Taken Time  BP    Temp    Pulse 88 09/11/2017 12:22 PM  Resp 10 09/11/2017 12:22 PM  SpO2 100 % 09/11/2017 12:22 PM  Vitals shown include unvalidated device data.  Last Pain:  Vitals:   09/11/17 0900  PainSc: 0-No pain         Complications: No apparent anesthesia complications

## 2017-09-12 ENCOUNTER — Inpatient Hospital Stay: Payer: 59

## 2017-09-12 ENCOUNTER — Encounter: Payer: Self-pay | Admitting: General Practice

## 2017-09-12 ENCOUNTER — Inpatient Hospital Stay: Payer: 59 | Admitting: Medical

## 2017-09-12 ENCOUNTER — Telehealth: Payer: Self-pay | Admitting: *Deleted

## 2017-09-12 ENCOUNTER — Inpatient Hospital Stay (HOSPITAL_BASED_OUTPATIENT_CLINIC_OR_DEPARTMENT_OTHER): Payer: 59 | Admitting: Adult Health

## 2017-09-12 ENCOUNTER — Encounter: Payer: Self-pay | Admitting: *Deleted

## 2017-09-12 ENCOUNTER — Encounter: Payer: Self-pay | Admitting: Adult Health

## 2017-09-12 VITALS — BP 140/76 | HR 71 | Temp 98.7°F | Resp 20 | Ht 66.0 in | Wt 213.7 lb

## 2017-09-12 DIAGNOSIS — Z5111 Encounter for antineoplastic chemotherapy: Secondary | ICD-10-CM | POA: Diagnosis not present

## 2017-09-12 DIAGNOSIS — Z87891 Personal history of nicotine dependence: Secondary | ICD-10-CM | POA: Diagnosis not present

## 2017-09-12 DIAGNOSIS — C50212 Malignant neoplasm of upper-inner quadrant of left female breast: Secondary | ICD-10-CM

## 2017-09-12 DIAGNOSIS — Z170 Estrogen receptor positive status [ER+]: Principal | ICD-10-CM

## 2017-09-12 DIAGNOSIS — Z95828 Presence of other vascular implants and grafts: Secondary | ICD-10-CM

## 2017-09-12 DIAGNOSIS — T8090XA Unspecified complication following infusion and therapeutic injection, initial encounter: Secondary | ICD-10-CM

## 2017-09-12 DIAGNOSIS — Z800 Family history of malignant neoplasm of digestive organs: Secondary | ICD-10-CM | POA: Diagnosis not present

## 2017-09-12 DIAGNOSIS — Z803 Family history of malignant neoplasm of breast: Secondary | ICD-10-CM

## 2017-09-12 LAB — CBC WITH DIFFERENTIAL (CANCER CENTER ONLY)
BASOS ABS: 0 10*3/uL (ref 0.0–0.1)
BASOS PCT: 0 %
EOS ABS: 0 10*3/uL (ref 0.0–0.5)
EOS PCT: 0 %
HEMATOCRIT: 35.5 % (ref 34.8–46.6)
Hemoglobin: 12 g/dL (ref 11.6–15.9)
Lymphocytes Relative: 25 %
Lymphs Abs: 2.1 10*3/uL (ref 0.9–3.3)
MCH: 30.2 pg (ref 25.1–34.0)
MCHC: 33.8 g/dL (ref 31.5–36.0)
MCV: 89.2 fL (ref 79.5–101.0)
MONO ABS: 0.4 10*3/uL (ref 0.1–0.9)
Monocytes Relative: 4 %
NEUTROS ABS: 5.9 10*3/uL (ref 1.5–6.5)
Neutrophils Relative %: 71 %
PLATELETS: 231 10*3/uL (ref 145–400)
RBC: 3.98 MIL/uL (ref 3.70–5.45)
RDW: 13.1 % (ref 11.2–14.5)
WBC Count: 8.3 10*3/uL (ref 3.9–10.3)

## 2017-09-12 LAB — CMP (CANCER CENTER ONLY)
ALBUMIN: 3.8 g/dL (ref 3.5–5.0)
ALK PHOS: 78 U/L (ref 40–150)
ALT: 10 U/L (ref 0–55)
ANION GAP: 8 (ref 3–11)
AST: 9 U/L (ref 5–34)
BILIRUBIN TOTAL: 0.4 mg/dL (ref 0.2–1.2)
BUN: 11 mg/dL (ref 7–26)
CALCIUM: 9.1 mg/dL (ref 8.4–10.4)
CO2: 23 mmol/L (ref 22–29)
CREATININE: 0.85 mg/dL (ref 0.60–1.10)
Chloride: 108 mmol/L (ref 98–109)
GFR, Est AFR Am: >60 mL/min (ref >60)
GFR, Estimated: >60 mL/min (ref >60)
GLUCOSE: 120 mg/dL (ref 70–140)
Potassium: 3.5 mmol/L (ref 3.5–5.1)
Sodium: 139 mmol/L (ref 136–145)
TOTAL PROTEIN: 6.9 g/dL (ref 6.4–8.3)

## 2017-09-12 MED ORDER — DOXORUBICIN HCL CHEMO IV INJECTION 2 MG/ML
60.0000 mg/m2 | Freq: Once | INTRAVENOUS | Status: AC
  Administered 2017-09-12: 128 mg via INTRAVENOUS
  Filled 2017-09-12: qty 64

## 2017-09-12 MED ORDER — FAMOTIDINE IN NACL 20-0.9 MG/50ML-% IV SOLN
20.0000 mg | Freq: Two times a day (BID) | INTRAVENOUS | Status: DC
  Administered 2017-09-12: 20 mg via INTRAVENOUS

## 2017-09-12 MED ORDER — PALONOSETRON HCL INJECTION 0.25 MG/5ML
0.2500 mg | Freq: Once | INTRAVENOUS | Status: AC
  Administered 2017-09-12: 0.25 mg via INTRAVENOUS

## 2017-09-12 MED ORDER — SODIUM CHLORIDE 0.9 % IV SOLN
600.0000 mg/m2 | Freq: Once | INTRAVENOUS | Status: AC
  Administered 2017-09-12: 1280 mg via INTRAVENOUS
  Filled 2017-09-12: qty 64

## 2017-09-12 MED ORDER — SODIUM CHLORIDE 0.9% FLUSH
10.0000 mL | INTRAVENOUS | Status: DC | PRN
  Administered 2017-09-12: 10 mL
  Filled 2017-09-12: qty 10

## 2017-09-12 MED ORDER — HEPARIN SOD (PORK) LOCK FLUSH 100 UNIT/ML IV SOLN
500.0000 [IU] | Freq: Once | INTRAVENOUS | Status: AC | PRN
  Administered 2017-09-12: 500 [IU]
  Filled 2017-09-12: qty 5

## 2017-09-12 MED ORDER — SODIUM CHLORIDE 0.9 % IV SOLN
Freq: Once | INTRAVENOUS | Status: AC
  Administered 2017-09-12: 11:00:00 via INTRAVENOUS
  Filled 2017-09-12: qty 5

## 2017-09-12 MED ORDER — SODIUM CHLORIDE 0.9 % IV SOLN
Freq: Once | INTRAVENOUS | Status: AC
  Administered 2017-09-12: 11:00:00 via INTRAVENOUS

## 2017-09-12 MED ORDER — PALONOSETRON HCL INJECTION 0.25 MG/5ML
INTRAVENOUS | Status: AC
Start: 2017-09-12 — End: 2017-09-12
  Filled 2017-09-12: qty 5

## 2017-09-12 NOTE — Assessment & Plan Note (Addendum)
08/19/2017:Left lumpectomy: IDC grade 2, 2.5 cm, DCIS intermediate grade, lymphovascular invasion identified, margins negative, 1/2 lymph nodes positive with extracapsular extension, ER 90%, PR 40%, HER-2 negative ratio 1.15, Ki-67 15% T2 N1a stage II a; Mammaprint high risk   Pathology counseling: I discussed the final pathology report of the patient provided  a copy of this report. I discussed the margins as well as lymph node surgeries. We also discussed the final staging along with previously performed ER/PR and HER-2/neu testing.  Recommendation: 1.  Adjuvant chemotherapy with dose dense Adriamycin and Cytoxan x4 followed by Taxol weekly x12, echocardiogram 09/04/17 was normal with EF of 55-60% 2. followed by adjuvant radiation therapy 3.  Followed by adjuvant antiestrogen therapy     _______________________________________________________________________________________________  [name] is doing well today.  Her labs are normal.  There was concern over her port placement (see phone note from Val Dodd), however all of that was straightened out, and she is ok to proceed with treatment today.  We reviewed her anti emetic "map" and she understands how to take her nausea medications.  She will return in one week for labs and f/u.    

## 2017-09-12 NOTE — Progress Notes (Signed)
Pt experienced burning of sinuses toward end ofcytoxan  infusion.  Rate slowed but this did not help.  Initially burning got worse.  Sandi Mealy, PA called to assess pt. 20mg  IV Pepcid given and cytoxan resumed after 4minute observation.  Pt was able to finish cytoxan without any further issues and was discharged to home in no distress.

## 2017-09-12 NOTE — Progress Notes (Signed)
[facility] CSW Progress Notes  Brief check in visit w patient and husband in infusion room.  Provided two books to help w children's adjustment to patient's cancer diagnosis and treatment.  Will see her on Weds 5/1 for next visit w CSW.  Edwyna Shell, LCSW Clinical Social Worker Phone:  [phone]

## 2017-09-12 NOTE — Progress Notes (Signed)
[facility] up:    Sabrina Jordan, MD [address] [place] [address]   DIAGNOSIS: Cancer Staging Malignant neoplasm of upper-inner quadrant of left breast in female, estrogen receptor positive ([place]) Staging form: Breast, AJCC 8th Edition - Clinical: Stage IA (cT1c, cN0, cM0, G2, ER: Positive, PR: Positive, HER2: Negative) - Unsigned   SUMMARY OF ONCOLOGIC HISTORY:   Malignant neoplasm of upper-inner quadrant of left breast in female, estrogen receptor positive ([place])   07/24/2017 Initial Diagnosis    Left breast palpable lump upper inner quadrant 11 o'clock position: Ill-defined 1.6 cm mass, axilla ultrasound negative, ultrasound-guided biopsy: Grade 2 IDC ER 90%, PR 40%, Ki-67 15%, HER-2 negative      08/09/2017 Genetic Testing    Negative genetic testing common hereditary cancer panel.  The Hereditary Gene Panel offered by [facility] includes sequencing and/or deletion duplication testing of the following 47 genes: APC, ATM, AXIN2, BARD1, BMPR1A, BRCA1, BRCA2, BRIP1, CDH1, CDK4, CDKN2A (p14ARF), CDKN2A (p16INK4a), CHEK2, CTNNA1, DICER1, EPCAM (Deletion/duplication testing only), GREM1 (promoter region deletion/duplication testing only), KIT, MEN1, MLH1, MSH2, MSH3, MSH6, MUTYH, NBN, NF1, NHTL1, PALB2, PDGFRA, PMS2, POLD1, POLE, PTEN, RAD50, RAD51C, RAD51D, SDHB, SDHC, SDHD, SMAD4, SMARCA4. STK11, TP53, TSC1, TSC2, and VHL.  The following genes were evaluated for sequence changes only: SDHA and HOXB13 c.251G>A variant only. The report date is August 07, 2017.      08/16/2017 Surgery    Left lumpectomy: IDC grade 2, 2.5 cm, DCIS intermediate grade, lymphovascular invasion identified, margins negative, 1/2 lymph nodes positive with extracapsular extension, ER 90%, PR 40%, HER-2 negative ratio 1.15, Ki-67 15% T2 N1a stage II a; Mammaprint high risk       08/27/2017 -  Chemotherapy    The patient had DOXOrubicin (ADRIAMYCIN) chemo injection 128 mg,  60 mg/m2 = 128 mg, Intravenous,  Once, 1 of 4 cycles palonosetron (ALOXI) injection 0.25 mg, 0.25 mg, Intravenous,  Once, 1 of 4 cycles pegfilgrastim-cbqv (UDENYCA) injection 6 mg, 6 mg, Subcutaneous, Once, 1 of 4 cycles cyclophosphamide (CYTOXAN) 1,280 mg in sodium chloride 0.9 % 250 mL chemo infusion, 600 mg/m2 = 1,280 mg, Intravenous,  Once, 1 of 4 cycles PACLitaxel (TAXOL) 168 mg in dextrose 5 % 250 mL chemo infusion (</= '80mg'$ /m2), 80 mg/m2 = 168 mg, Intravenous,  Once, 0 of 12 cycles fosaprepitant (EMEND) 150 mg, dexamethasone (DECADRON) 12 mg in sodium chloride 0.9 % 145 mL IVPB, , Intravenous,  Once, 1 of 4 cycles  for chemotherapy treatment.        CURRENT THERAPY: Adriamycin Cytoxan  INTERVAL HISTORY: [name] 37 y.o. female returns for evaluation prior to receiving her first cycle of Adriamycin and Cytoxan.  She underwent port placement yesterday.  Her needle is still accessed from her port placement.  She is confused about her anti emetic regimen.  She was unable to pick up her Lorazepam due to the fact that she is currently taking Alprazolam and the pharmacy would not release the medication.  She is wanting to know what to do about the Lorazepam.     Patient Active Problem List   Diagnosis Date Noted  . Port-A-Cath in place 09/12/2017  . Genetic testing 08/09/2017  . Family history of breast cancer   . Family history of colon cancer   . Family history of cancer   . Malignant neoplasm of upper-inner quadrant of left breast in female, estrogen receptor positive ([place]) 07/30/2017  . Previous cesarean section 09/05/2015  .  Postpartum care following cesarean delivery with btl (4/8) 09/04/2015  . Symptomatic cholelithiasis 11/20/2011    has No Known Allergies.  MEDICAL HISTORY: Past Medical History:  Diagnosis Date  . Anxiety   . Breast cancer, left ([place]) 07/2017  . Family history of breast cancer   . Family history of colon cancer     SURGICAL HISTORY: Past  Surgical History:  Procedure Laterality Date  . BREAST LUMPECTOMY WITH RADIOACTIVE SEED AND SENTINEL LYMPH NODE BIOPSY Left 08/16/2017   Procedure: BREAST LUMPECTOMY WITH RADIOACTIVE SEED AND SENTINEL LYMPH NODE BIOPSY;  Surgeon: Excell Seltzer, MD;  Location: [facility];  Service: General;  Laterality: Left;  . BREAST LUMPECTOMY WITH SENTINEL LYMPH NODE BIOPSY Left 08/16/2017  . BREAST REDUCTION SURGERY Bilateral 08/23/2017   Procedure: LEFT ONCOPLASTIC REDUCTION, RIGHT BREAST REDUCTION;  Surgeon: Irene Limbo, MD;  Location: [facility];  Service: Plastics;  Laterality: Bilateral;  . CESAREAN SECTION  03/15/2012   Procedure: CESAREAN SECTION;  Surgeon: Lovenia Kim, MD;  Location: [facility];  Service: Obstetrics;  Laterality: N/A;  Primary cesarean section with delivery of baby girl at 51. Apgars 4/7.  Marland Kitchen CESAREAN SECTION WITH BILATERAL TUBAL LIGATION Bilateral 09/03/2015   Procedure: REPEAT CESAREAN SECTION WITH BILATERAL TUBAL LIGATION;  Surgeon: Brien Few, MD;  Location: [facility];  Service: Obstetrics;  Laterality: Bilateral;  EDD: 09/10/15  . CHOLECYSTECTOMY  12/07/2011   Procedure: LAPAROSCOPIC CHOLECYSTECTOMY;  Surgeon: Harl Bowie, MD;  Location: [facility];  Service: General;  Laterality: N/A;  . PORTACATH PLACEMENT Right 09/11/2017   Procedure: INSERTION PORT-A-CATH;  Surgeon: Excell Seltzer, MD;  Location: [facility];  Service: General;  Laterality: Right;  . WISDOM TOOTH EXTRACTION  2002    SOCIAL HISTORY: Social History   Socioeconomic History  . Marital status: Married    Spouse name: Not on file  . Number of children: Not on file  . Years of education: Not on file  . Highest education level: Not on file  Occupational History  . Not on file  Social Needs  . Financial resource strain: Not on file  . Food insecurity:    Worry: Not on file    Inability: Not on file  . Transportation needs:    Medical: Not on file    Non-medical:  Not on file  Tobacco Use  . Smoking status: Former Smoker    Packs/day: 0.00    Last attempt to quit: 05/27/2014    Years since quitting: 3.2  . Smokeless tobacco: Never Used  Substance and Sexual Activity  . Alcohol use: Yes    Comment: rarely  . Drug use: Yes    Types: Marijuana    Comment: reports she occsionally uses THC when vaping   . Sexual activity: Yes  Lifestyle  . Physical activity:    Days per week: Not on file    Minutes per session: Not on file  . Stress: Not on file  Relationships  . Social connections:    Talks on phone: Not on file    Gets together: Not on file    Attends religious service: Not on file    Active member of club or organization: Not on file    Attends meetings of clubs or organizations: Not on file    Relationship status: Not on file  . Intimate partner violence:    Fear of current or ex partner: Not on file    Emotionally abused: Not on file    Physically abused:  Not on file    Forced sexual activity: Not on file  Other Topics Concern  . Not on file  Social History Narrative  . Not on file    FAMILY HISTORY: Family History  Problem Relation Age of Onset  . Diabetes Mother   . Cancer Mother 36       chondrosarcoma  . Breast cancer Mother 32       DCIS vs Atypical Hyperplasia  . Breast cancer Other        Maternal Great Aunt; MGMs sister  . Breast cancer Other        Maternal Great Aunt  . Other Maternal Grandmother        d. from complications of a fall and breaking her hip  . Alcohol abuse Maternal Grandfather   . Colon cancer Paternal Grandmother     Review of Systems  Constitutional: Negative for appetite change, chills, fatigue, fever and unexpected weight change.  HENT:   Negative for hearing loss, lump/mass, sore throat and trouble swallowing.   Eyes: Negative for eye problems and icterus.  Respiratory: Negative for chest tightness, cough and shortness of breath.   Cardiovascular: Negative for chest pain, leg swelling  and palpitations.  Gastrointestinal: Negative for abdominal distention, abdominal pain, constipation, diarrhea, nausea and vomiting.  Endocrine: Negative for hot flashes.  Genitourinary: Negative for difficulty urinating.   Musculoskeletal: Negative for arthralgias.  Skin: Negative for itching and rash.  Neurological: Negative for dizziness, extremity weakness, headaches and numbness.  Hematological: Negative for adenopathy. Does not bruise/bleed easily.  Psychiatric/Behavioral: Negative for depression. The patient is not nervous/anxious.       PHYSICAL EXAMINATION  ECOG PERFORMANCE STATUS: 1 - Symptomatic but completely ambulatory  Vitals:   09/12/17 0918  BP: 140/76  Pulse: 71  Resp: 20  Temp: 98.7 F (37.1 C)  SpO2: 100%    Physical Exam  Constitutional: She is oriented to person, place, and time and well-developed, well-nourished, and in no distress.  HENT:  Head: Normocephalic and atraumatic.  Mouth/Throat: Oropharynx is clear and moist. No oropharyngeal exudate.  Eyes: Pupils are equal, round, and reactive to light. No scleral icterus.  Neck: Neck supple.  Cardiovascular: Normal rate, regular rhythm and normal heart sounds.  Pulmonary/Chest: Effort normal and breath sounds normal. No respiratory distress. She has no wheezes. She has no rales.  Abdominal: Soft. Bowel sounds are normal. She exhibits no distension and no mass. There is no tenderness. There is no rebound and no guarding.  Musculoskeletal: She exhibits no edema.  Lymphadenopathy:    She has no cervical adenopathy.  Neurological: She is alert and oriented to person, place, and time.  Skin: Skin is warm and dry. No rash noted.  Psychiatric: Mood and affect normal.    LABORATORY DATA:  CBC    Component Value Date/Time   WBC 8.3 09/12/2017 0857   WBC 7.2 09/09/2017 0953   RBC 3.98 09/12/2017 0857   HGB 12.6 09/09/2017 0953   HCT 35.5 09/12/2017 0857   PLT 231 09/12/2017 0857   MCV 89.2 09/12/2017  0857   MCH 30.2 09/12/2017 0857   MCHC 33.8 09/12/2017 0857   RDW 13.1 09/12/2017 0857   LYMPHSABS 2.1 09/12/2017 0857   MONOABS 0.4 09/12/2017 0857   EOSABS 0.0 09/12/2017 0857   BASOSABS 0.0 09/12/2017 0857    CMP     Component Value Date/Time   NA 139 09/12/2017 0857   K 3.5 09/12/2017 0857   CL 108 09/12/2017 0857  CO2 23 09/12/2017 0857   GLUCOSE 120 09/12/2017 0857   BUN 11 09/12/2017 0857   CREATININE 0.85 09/12/2017 0857   CALCIUM 9.1 09/12/2017 0857   PROT 6.9 09/12/2017 0857   ALBUMIN 3.8 09/12/2017 0857   AST 9 09/12/2017 0857   ALT 10 09/12/2017 0857   ALKPHOS 78 09/12/2017 0857   BILITOT 0.4 09/12/2017 0857   GFRNONAA >60 09/12/2017 0857   GFRAA >60 09/12/2017 0857    RADIOGRAPHIC STUDIES:  Dg Chest Port 1 View  Addendum Date: 09/12/2017   ADDENDUM REPORT: 09/12/2017 10:38 ADDENDUM: This addendum is given for the purpose of clarifying the position of the tip of the patient's Port-A-Cath. The tip of the catheter is at the brachiocephalic junction which is the origin of the superior vena cava. Electronically Signed   By: Inge Rise M.D.   On: 09/12/2017 10:38   Result Date: 09/12/2017 CLINICAL DATA:  Status post Port-A-Cath placement today. EXAM: PORTABLE CHEST 1 VIEW COMPARISON:  None. FINDINGS: Right IJ approach Port-A-Cath is in place. Superior loop of the catheter is off the upper margin of the film. Tip of the catheter projects near the junction of the brachiocephalic veins. No pneumothorax. Heart size is normal. No pleural effusion. No acute bony abnormality. IMPRESSION: Tip of right IJ approach Port-A-Cath projects at approximately the junction of the brachiocephalic veins. Superior loop of the catheter is off the upper margin of the film. Negative for pneumothorax. Electronically Signed: By: Inge Rise M.D. On: 09/11/2017 13:01   Dg C-arm 1-60 Min-no Report  Result Date: 09/11/2017 Fluoroscopy was utilized by the requesting physician.  No  radiographic interpretation.     ASSESSMENT and PLAN:   Malignant neoplasm of upper-inner quadrant of left breast in female, estrogen receptor positive ([place]) 08/19/2017:Left lumpectomy: IDC grade 2, 2.5 cm, DCIS intermediate grade, lymphovascular invasion identified, margins negative, 1/2 lymph nodes positive with extracapsular extension, ER 90%, PR 40%, HER-2 negative ratio 1.15, Ki-67 15% T2 N1a stage II a; Mammaprint high risk   Pathology counseling: I discussed the final pathology report of the patient provided  a copy of this report. I discussed the margins as well as lymph node surgeries. We also discussed the final staging along with previously performed ER/PR and HER-2/neu testing.  Recommendation: 1.  Adjuvant chemotherapy with dose dense Adriamycin and Cytoxan x4 followed by Taxol weekly x12, echocardiogram 09/04/17 was normal with EF of 55-60% 2. followed by adjuvant radiation therapy 3.  Followed by adjuvant antiestrogen therapy     _______________________________________________________________________________________________  Julia is doing well today.  Her labs are normal.  There was concern over her port placement (see phone note from [facility]), however all of that was straightened out, and she is ok to proceed with treatment today.  We reviewed her anti emetic "map" and she understands how to take her nausea medications.  She will return in one week for labs and f/u.     All questions were answered. The patient knows to call the clinic with any problems, questions or concerns. We can certainly see the patient much sooner if necessary.  A total of (30) minutes of face-to-face time was spent with this patient with greater than 50% of that time in counseling and care-coordination.  This note was electronically signed. Scot Dock, NP 09/12/2017

## 2017-09-12 NOTE — Patient Instructions (Signed)
[facility] Discharge Instructions for Patients Receiving Chemotherapy  Today you received the following chemotherapy agents:  Adriamycin, Cytoxan  To help prevent nausea and vomiting after your treatment, we encourage you to take your nausea medication as prescribed.   If you develop nausea and vomiting that is not controlled by your nausea medication, call the clinic.   BELOW ARE SYMPTOMS THAT SHOULD BE REPORTED IMMEDIATELY:  *FEVER GREATER THAN 100.5 F  *CHILLS WITH OR WITHOUT FEVER  NAUSEA AND VOMITING THAT IS NOT CONTROLLED WITH YOUR NAUSEA MEDICATION  *UNUSUAL SHORTNESS OF BREATH  *UNUSUAL BRUISING OR BLEEDING  TENDERNESS IN MOUTH AND THROAT WITH OR WITHOUT PRESENCE OF ULCERS  *URINARY PROBLEMS  *BOWEL PROBLEMS  UNUSUAL RASH Items with * indicate a potential emergency and should be followed up as soon as possible.  Feel free to call the clinic should you have any questions or concerns. The clinic phone number is [phone].  Please show the CHEMO ALERT CARD at check-in to the Emergency Department and triage nurse.    Cyclophosphamide injection What is this medicine? CYCLOPHOSPHAMIDE (sye kloe FOSS fa mide) is a chemotherapy drug. It slows the growth of cancer cells. This medicine is used to treat many types of cancer like lymphoma, myeloma, leukemia, breast cancer, and ovarian cancer, to name a few. This medicine may be used for other purposes; ask your health care provider or pharmacist if you have questions. COMMON BRAND NAME(S): Cytoxan, Neosar What should I tell my health care provider before I take this medicine? They need to know if you have any of these conditions: -blood disorders -history of other chemotherapy -infection -kidney disease -liver disease -recent or ongoing radiation therapy -tumors in the bone marrow -an unusual or allergic reaction to cyclophosphamide, other chemotherapy, other medicines, foods, dyes, or  preservatives -pregnant or trying to get pregnant -breast-feeding How should I use this medicine? This drug is usually given as an injection into a vein or muscle or by infusion into a vein. It is administered in a hospital or clinic by a specially trained health care professional. Talk to your pediatrician regarding the use of this medicine in children. Special care may be needed. Overdosage: If you think you have taken too much of this medicine contact a poison control center or emergency room at once. NOTE: This medicine is only for you. Do not share this medicine with others. What if I miss a dose? It is important not to miss your dose. Call your doctor or health care professional if you are unable to keep an appointment. What may interact with this medicine? This medicine may interact with the following medications: -amiodarone -amphotericin B -azathioprine -certain antiviral medicines for HIV or AIDS such as protease inhibitors (e.g., indinavir, ritonavir) and zidovudine -certain blood pressure medications such as benazepril, captopril, enalapril, fosinopril, lisinopril, moexipril, monopril, perindopril, quinapril, ramipril, trandolapril -certain cancer medications such as anthracyclines (e.g., daunorubicin, doxorubicin), busulfan, cytarabine, paclitaxel, pentostatin, tamoxifen, trastuzumab -certain diuretics such as chlorothiazide, chlorthalidone, hydrochlorothiazide, indapamide, metolazone -certain medicines that treat or prevent blood clots like warfarin -certain muscle relaxants such as succinylcholine -cyclosporine -etanercept -indomethacin -medicines to increase blood counts like filgrastim, pegfilgrastim, sargramostim -medicines used as general anesthesia -metronidazole -natalizumab This list may not describe all possible interactions. Give your health care provider a list of all the medicines, herbs, non-prescription drugs, or dietary supplements you use. Also tell them if  you smoke, drink alcohol, or use illegal drugs. Some items may interact with your medicine. What should   I watch for while using this medicine? Visit your doctor for checks on your progress. This drug may make you feel generally unwell. This is not uncommon, as chemotherapy can affect healthy cells as well as cancer cells. Report any side effects. Continue your course of treatment even though you feel ill unless your doctor tells you to stop. Drink water or other fluids as directed. Urinate often, even at night. In some cases, you may be given additional medicines to help with side effects. Follow all directions for their use. Call your doctor or health care professional for advice if you get a fever, chills or sore throat, or other symptoms of a cold or flu. Do not treat yourself. This drug decreases your body's ability to fight infections. Try to avoid being around people who are sick. This medicine may increase your risk to bruise or bleed. Call your doctor or health care professional if you notice any unusual bleeding. Be careful brushing and flossing your teeth or using a toothpick because you may get an infection or bleed more easily. If you have any dental work done, tell your dentist you are receiving this medicine. You may get drowsy or dizzy. Do not drive, use machinery, or do anything that needs mental alertness until you know how this medicine affects you. Do not become pregnant while taking this medicine or for 1 year after stopping it. Women should inform their doctor if they wish to become pregnant or think they might be pregnant. Men should not father a child while taking this medicine and for 4 months after stopping it. There is a potential for serious side effects to an unborn child. Talk to your health care professional or pharmacist for more information. Do not breast-feed an infant while taking this medicine. This medicine may interfere with the ability to have a child. This medicine  has caused ovarian failure in some women. This medicine has caused reduced sperm counts in some men. You should talk with your doctor or health care professional if you are concerned about your fertility. If you are going to have surgery, tell your doctor or health care professional that you have taken this medicine. What side effects may I notice from receiving this medicine? Side effects that you should report to your doctor or health care professional as soon as possible: -allergic reactions like skin rash, itching or hives, swelling of the face, lips, or tongue -low blood counts - this medicine may decrease the number of white blood cells, red blood cells and platelets. You may be at increased risk for infections and bleeding. -signs of infection - fever or chills, cough, sore throat, pain or difficulty passing urine -signs of decreased platelets or bleeding - bruising, pinpoint red spots on the skin, black, tarry stools, blood in the urine -signs of decreased red blood cells - unusually weak or tired, fainting spells, lightheadedness -breathing problems -dark urine -dizziness -palpitations -swelling of the ankles, feet, hands -trouble passing urine or change in the amount of urine -weight gain -yellowing of the eyes or skin Side effects that usually do not require medical attention (report to your doctor or health care professional if they continue or are bothersome): -changes in nail or skin color -hair loss -missed menstrual periods -mouth sores -nausea, vomiting This list may not describe all possible side effects. Call your doctor for medical advice about side effects. You may report side effects to FDA at 1-800-FDA-1088. Where should I keep my medicine? This drug is given in  a hospital or clinic and will not be stored at home. NOTE: This sheet is a summary. It may not cover all possible information. If you have questions about this medicine, talk to your doctor, pharmacist, or  health care provider.  2018 Elsevier/Gold Standard (2012-03-28 16:22:58)   Doxorubicin injection What is this medicine? DOXORUBICIN (dox oh ROO bi sin) is a chemotherapy drug. It is used to treat many kinds of cancer like leukemia, lymphoma, neuroblastoma, sarcoma, and Wilms' tumor. It is also used to treat bladder cancer, breast cancer, lung cancer, ovarian cancer, stomach cancer, and thyroid cancer. This medicine may be used for other purposes; ask your health care provider or pharmacist if you have questions. COMMON BRAND NAME(S): Adriamycin, Adriamycin PFS, Adriamycin RDF, Rubex What should I tell my health care provider before I take this medicine? They need to know if you have any of these conditions: -heart disease -history of low blood counts caused by a medicine -liver disease -recent or ongoing radiation therapy -an unusual or allergic reaction to doxorubicin, other chemotherapy agents, other medicines, foods, dyes, or preservatives -pregnant or trying to get pregnant -breast-feeding How should I use this medicine? This drug is given as an infusion into a vein. It is administered in a hospital or clinic by a specially trained health care professional. If you have pain, swelling, burning or any unusual feeling around the site of your injection, tell your health care professional right away. Talk to your pediatrician regarding the use of this medicine in children. Special care may be needed. Overdosage: If you think you have taken too much of this medicine contact a poison control center or emergency room at once. NOTE: This medicine is only for you. Do not share this medicine with others. What if I miss a dose? It is important not to miss your dose. Call your doctor or health care professional if you are unable to keep an appointment. What may interact with this medicine? This medicine may interact with the following medications: -6-mercaptopurine -paclitaxel -phenytoin -St.  John's Wort -trastuzumab -verapamil This list may not describe all possible interactions. Give your health care provider a list of all the medicines, herbs, non-prescription drugs, or dietary supplements you use. Also tell them if you smoke, drink alcohol, or use illegal drugs. Some items may interact with your medicine. What should I watch for while using this medicine? This drug may make you feel generally unwell. This is not uncommon, as chemotherapy can affect healthy cells as well as cancer cells. Report any side effects. Continue your course of treatment even though you feel ill unless your doctor tells you to stop. There is a maximum amount of this medicine you should receive throughout your life. The amount depends on the medical condition being treated and your overall health. Your doctor will watch how much of this medicine you receive in your lifetime. Tell your doctor if you have taken this medicine before. You may need blood work done while you are taking this medicine. Your urine may turn red for a few days after your dose. This is not blood. If your urine is dark or brown, call your doctor. In some cases, you may be given additional medicines to help with side effects. Follow all directions for their use. Call your doctor or health care professional for advice if you get a fever, chills or sore throat, or other symptoms of a cold or flu. Do not treat yourself. This drug decreases your body's ability to fight infections.   Try to avoid being around people who are sick. This medicine may increase your risk to bruise or bleed. Call your doctor or health care professional if you notice any unusual bleeding. Talk to your doctor about your risk of cancer. You may be more at risk for certain types of cancers if you take this medicine. Do not become pregnant while taking this medicine or for 6 months after stopping it. Women should inform their doctor if they wish to become pregnant or think they  might be pregnant. Men should not father a child while taking this medicine and for 6 months after stopping it. There is a potential for serious side effects to an unborn child. Talk to your health care professional or pharmacist for more information. Do not breast-feed an infant while taking this medicine. This medicine has caused ovarian failure in some women and reduced sperm counts in some men This medicine may interfere with the ability to have a child. Talk with your doctor or health care professional if you are concerned about your fertility. What side effects may I notice from receiving this medicine? Side effects that you should report to your doctor or health care professional as soon as possible: -allergic reactions like skin rash, itching or hives, swelling of the face, lips, or tongue -breathing problems -chest pain -fast or irregular heartbeat -low blood counts - this medicine may decrease the number of white blood cells, red blood cells and platelets. You may be at increased risk for infections and bleeding. -pain, redness, or irritation at site where injected -signs of infection - fever or chills, cough, sore throat, pain or difficulty passing urine -signs of decreased platelets or bleeding - bruising, pinpoint red spots on the skin, black, tarry stools, blood in the urine -swelling of the ankles, feet, hands -tiredness -weakness Side effects that usually do not require medical attention (report to your doctor or health care professional if they continue or are bothersome): -diarrhea -hair loss -mouth sores -nail discoloration or damage -nausea -red colored urine -vomiting This list may not describe all possible side effects. Call your doctor for medical advice about side effects. You may report side effects to FDA at 1-800-FDA-1088. Where should I keep my medicine? This drug is given in a hospital or clinic and will not be stored at home. NOTE: This sheet is a summary. It  may not cover all possible information. If you have questions about this medicine, talk to your doctor, pharmacist, or health care provider.  2018 Elsevier/Gold Standard (2015-07-11 11:28:51)  

## 2017-09-12 NOTE — Telephone Encounter (Signed)
This RN spoke with patient and her husband per need for further evaluation of noted reading on Xray for tip placement of port performed surgically yesterday under Dr Excell Seltzer.  This RN accessed line ( port was accessed ) and was able to flush 20 cc briskly (pulse flushing) without any discomfort noted by patient. 10 cc of blood returned without no resistance. Line dripped freely and consistently to gravity.  Dr Excell Seltzer was contacted and per his review of xray with  Dr Lucia Gaskins port tip is appropriately placed- he is contacting the radiologist for revision of xray per policy for use.  Above discussed with pt who stated anxiety over above but appreciated concern for care.  Pt taken to Infusion Room to proceed with therapy.

## 2017-09-13 ENCOUNTER — Ambulatory Visit: Payer: 59

## 2017-09-13 ENCOUNTER — Telehealth: Payer: Self-pay

## 2017-09-13 ENCOUNTER — Inpatient Hospital Stay: Payer: 59

## 2017-09-13 ENCOUNTER — Telehealth: Payer: Self-pay | Admitting: Adult Health

## 2017-09-13 VITALS — BP 144/75 | HR 66 | Temp 98.3°F | Resp 18

## 2017-09-13 DIAGNOSIS — Z170 Estrogen receptor positive status [ER+]: Principal | ICD-10-CM

## 2017-09-13 DIAGNOSIS — Z5111 Encounter for antineoplastic chemotherapy: Secondary | ICD-10-CM | POA: Diagnosis not present

## 2017-09-13 DIAGNOSIS — C50212 Malignant neoplasm of upper-inner quadrant of left female breast: Secondary | ICD-10-CM

## 2017-09-13 MED ORDER — PEGFILGRASTIM-CBQV 6 MG/0.6ML ~~LOC~~ SOSY
6.0000 mg | PREFILLED_SYRINGE | Freq: Once | SUBCUTANEOUS | Status: AC
  Administered 2017-09-13: 6 mg via SUBCUTANEOUS

## 2017-09-13 MED ORDER — PEGFILGRASTIM-CBQV 6 MG/0.6ML ~~LOC~~ SOSY
PREFILLED_SYRINGE | SUBCUTANEOUS | Status: AC
  Filled 2017-09-13: qty 0.6

## 2017-09-13 NOTE — Telephone Encounter (Signed)
-----   Message from Paulla Dolly, RN sent at 09/12/2017  3:55 PM EDT ----- Regarding: [name] 1st chemo Adriamycin, cytoxan Pt tolerated adria without any issues. Pt developed burning in sinuses with cytoxan . See progress note.

## 2017-09-13 NOTE — Patient Instructions (Signed)
Pegfilgrastim injection What is this medicine? PEGFILGRASTIM (PEG fil gra stim) is a long-acting granulocyte colony-stimulating factor that stimulates the growth of neutrophils, a type of white blood cell important in the body's fight against infection. It is used to reduce the incidence of fever and infection in patients with certain types of cancer who are receiving chemotherapy that affects the bone marrow, and to increase survival after being exposed to high doses of radiation. This medicine may be used for other purposes; ask your health care provider or pharmacist if you have questions. COMMON BRAND NAME(S): Neulasta What should I tell my health care provider before I take this medicine? They need to know if you have any of these conditions: -kidney disease -latex allergy -ongoing radiation therapy -sickle cell disease -skin reactions to acrylic adhesives (On-Body Injector only) -an unusual or allergic reaction to pegfilgrastim, filgrastim, other medicines, foods, dyes, or preservatives -pregnant or trying to get pregnant -breast-feeding How should I use this medicine? This medicine is for injection under the skin. If you get this medicine at home, you will be taught how to prepare and give the pre-filled syringe or how to use the On-body Injector. Refer to the patient Instructions for Use for detailed instructions. Use exactly as directed. Tell your healthcare provider immediately if you suspect that the On-body Injector may not have performed as intended or if you suspect the use of the On-body Injector resulted in a missed or partial dose. It is important that you put your used needles and syringes in a special sharps container. Do not put them in a trash can. If you do not have a sharps container, call your pharmacist or healthcare provider to get one. Talk to your pediatrician regarding the use of this medicine in children. While this drug may be prescribed for selected conditions,  precautions do apply. Overdosage: If you think you have taken too much of this medicine contact a poison control center or emergency room at once. NOTE: This medicine is only for you. Do not share this medicine with others. What if I miss a dose? It is important not to miss your dose. Call your doctor or health care professional if you miss your dose. If you miss a dose due to an On-body Injector failure or leakage, a new dose should be administered as soon as possible using a single prefilled syringe for manual use. What may interact with this medicine? Interactions have not been studied. Give your health care provider a list of all the medicines, herbs, non-prescription drugs, or dietary supplements you use. Also tell them if you smoke, drink alcohol, or use illegal drugs. Some items may interact with your medicine. This list may not describe all possible interactions. Give your health care provider a list of all the medicines, herbs, non-prescription drugs, or dietary supplements you use. Also tell them if you smoke, drink alcohol, or use illegal drugs. Some items may interact with your medicine. What should I watch for while using this medicine? You may need blood work done while you are taking this medicine. If you are going to need a MRI, CT scan, or other procedure, tell your doctor that you are using this medicine (On-Body Injector only). What side effects may I notice from receiving this medicine? Side effects that you should report to your doctor or health care professional as soon as possible: -allergic reactions like skin rash, itching or hives, swelling of the face, lips, or tongue -dizziness -fever -pain, redness, or irritation at site   where injected -pinpoint red spots on the skin -red or dark-brown urine -shortness of breath or breathing problems -stomach or side pain, or pain at the shoulder -swelling -tiredness -trouble passing urine or change in the amount of urine Side  effects that usually do not require medical attention (report to your doctor or health care professional if they continue or are bothersome): -bone pain -muscle pain This list may not describe all possible side effects. Call your doctor for medical advice about side effects. You may report side effects to FDA at 1-800-FDA-1088. Where should I keep my medicine? Keep out of the reach of children. Store pre-filled syringes in a refrigerator between 2 and 8 degrees C (36 and 46 degrees F). Do not freeze. Keep in carton to protect from light. Throw away this medicine if it is left out of the refrigerator for more than 48 hours. Throw away any unused medicine after the expiration date. NOTE: This sheet is a summary. It may not cover all possible information. If you have questions about this medicine, talk to your doctor, pharmacist, or health care provider.  2018 Elsevier/Gold Standard (2016-05-10 12:58:03)  

## 2017-09-13 NOTE — Telephone Encounter (Signed)
Called pt to follow up regarding first treatment yesterday. She states she is feeling well. She has been resting. She did want to make [place] aware that she had some slight heart palpitations for a few minutes last night. But other then that is feeling well. No questions or concerns at this time.  Cyndia Bent RN

## 2017-09-13 NOTE — Telephone Encounter (Signed)
Per 4/18 no los °

## 2017-09-16 ENCOUNTER — Encounter: Payer: Self-pay | Admitting: *Deleted

## 2017-09-16 NOTE — Progress Notes (Signed)
   DATE:  09/12/2017      _X_ CHEMO/IMMUNOTHERAPY REACTION         MD:  Lindi Adie   AGENT/BLOOD PRODUCT RECEIVING TODAY:  Adriamycin and Cytoxan   AGENT/BLOOD PRODUCT RECEIVED IMMEDIATELY PRIOR TO REACTION: Cytoxan  REACTION(S): Sinus burning  PREMEDS:  _X_ Aloxi    _X_ Dexamethasone (_12_ mg, _X_ IV ___ PO)    _X_ Emend  INTERVENTION:  _X_  Pepcid (20 mg)  x  _1_    Review of Systems  Constitutional: Negative for chills and diaphoresis.  HENT: Negative for sore throat.        Sinus burning  Respiratory: Negative for cough and shortness of breath.   Cardiovascular: Negative for chest pain, palpitations and leg swelling.  Musculoskeletal: Negative for back pain.  Neurological: Negative for speech change, weakness and headaches.    Physical Exam  Constitutional: No distress.  HENT:  Head: Normocephalic and atraumatic.  Cardiovascular: Normal rate, regular rhythm and normal heart sounds. Exam reveals no gallop and no friction rub.  No murmur heard. Pulmonary/Chest: Effort normal and breath sounds normal. No respiratory distress. She has no wheezes. She has no rales.  Neurological: She is alert.  Skin: Skin is warm and dry. No rash noted. She is not diaphoretic. No erythema.        OUTCOME: _X_  Patient responded to intervention  _X_ Chemo/Immunotherapy restarted and completed    Sandi Mealy, MHS, PA-C

## 2017-09-19 ENCOUNTER — Encounter: Payer: Self-pay | Admitting: Adult Health

## 2017-09-19 ENCOUNTER — Encounter: Payer: Self-pay | Admitting: *Deleted

## 2017-09-19 ENCOUNTER — Inpatient Hospital Stay (HOSPITAL_BASED_OUTPATIENT_CLINIC_OR_DEPARTMENT_OTHER): Payer: 59 | Admitting: Adult Health

## 2017-09-19 ENCOUNTER — Inpatient Hospital Stay: Payer: 59

## 2017-09-19 VITALS — BP 108/65 | HR 74 | Temp 98.6°F | Resp 18 | Ht 66.0 in | Wt 211.0 lb

## 2017-09-19 DIAGNOSIS — Z800 Family history of malignant neoplasm of digestive organs: Secondary | ICD-10-CM | POA: Diagnosis not present

## 2017-09-19 DIAGNOSIS — C50212 Malignant neoplasm of upper-inner quadrant of left female breast: Secondary | ICD-10-CM

## 2017-09-19 DIAGNOSIS — Z170 Estrogen receptor positive status [ER+]: Secondary | ICD-10-CM | POA: Diagnosis not present

## 2017-09-19 DIAGNOSIS — Z5111 Encounter for antineoplastic chemotherapy: Secondary | ICD-10-CM | POA: Diagnosis not present

## 2017-09-19 DIAGNOSIS — Z87891 Personal history of nicotine dependence: Secondary | ICD-10-CM

## 2017-09-19 DIAGNOSIS — Z95828 Presence of other vascular implants and grafts: Secondary | ICD-10-CM

## 2017-09-19 LAB — CMP (CANCER CENTER ONLY)
ALK PHOS: 94 U/L (ref 40–150)
ALT: 9 U/L (ref 0–55)
AST: 9 U/L (ref 5–34)
Albumin: 3.7 g/dL (ref 3.5–5.0)
Anion gap: 8 (ref 3–11)
BILIRUBIN TOTAL: 0.3 mg/dL (ref 0.2–1.2)
BUN: 13 mg/dL (ref 7–26)
CALCIUM: 9 mg/dL (ref 8.4–10.4)
CO2: 24 mmol/L (ref 22–29)
CREATININE: 0.76 mg/dL (ref 0.60–1.10)
Chloride: 108 mmol/L (ref 98–109)
GFR, Est AFR Am: >60 mL/min (ref >60)
Glucose, Bld: 124 mg/dL (ref 70–140)
Potassium: 4 mmol/L (ref 3.5–5.1)
Sodium: 140 mmol/L (ref 136–145)
Total Protein: 6.4 g/dL (ref 6.4–8.3)

## 2017-09-19 LAB — CBC WITH DIFFERENTIAL (CANCER CENTER ONLY)
BASOS PCT: 1 %
Basophils Absolute: 0.1 10*3/uL (ref 0.0–0.1)
EOS ABS: 0.7 10*3/uL — AB (ref 0.0–0.5)
EOS PCT: 17 %
HCT: 34.6 % — ABNORMAL LOW (ref 34.8–46.6)
HEMOGLOBIN: 11.7 g/dL (ref 11.6–15.9)
LYMPHS ABS: 1.1 10*3/uL (ref 0.9–3.3)
Lymphocytes Relative: 25 %
MCH: 29.6 pg (ref 25.1–34.0)
MCHC: 33.8 g/dL (ref 31.5–36.0)
MCV: 87.6 fL (ref 79.5–101.0)
Monocytes Absolute: 0.1 10*3/uL (ref 0.1–0.9)
Monocytes Relative: 2 %
NEUTROS PCT: 55 %
Neutro Abs: 2.3 10*3/uL (ref 1.5–6.5)
PLATELETS: 136 10*3/uL — AB (ref 145–400)
RBC: 3.95 MIL/uL (ref 3.70–5.45)
RDW: 12.5 % (ref 11.2–14.5)
WBC: 4.2 10*3/uL (ref 3.9–10.3)

## 2017-09-19 MED ORDER — SODIUM CHLORIDE 0.9% FLUSH
10.0000 mL | INTRAVENOUS | Status: DC | PRN
  Administered 2017-09-19: 10 mL
  Filled 2017-09-19: qty 10

## 2017-09-19 MED ORDER — HEPARIN SOD (PORK) LOCK FLUSH 100 UNIT/ML IV SOLN
500.0000 [IU] | Freq: Once | INTRAVENOUS | Status: AC | PRN
  Administered 2017-09-19: 500 [IU]
  Filled 2017-09-19: qty 5

## 2017-09-19 NOTE — Progress Notes (Signed)
[facility] Follow up:    Sabrina Jordan, MD [address] [id number]   DIAGNOSIS: Cancer Staging Malignant neoplasm of upper-inner quadrant of left breast in female, estrogen receptor positive ([place]) Staging form: Breast, AJCC 8th Edition - Clinical: Stage IA (cT1c, cN0, cM0, G2, ER: Positive, PR: Positive, HER2: Negative) - Unsigned   SUMMARY OF ONCOLOGIC HISTORY:   Malignant neoplasm of upper-inner quadrant of left breast in female, estrogen receptor positive ([place])   07/24/2017 Initial Diagnosis    Left breast palpable lump upper inner quadrant 11 o'clock position: Ill-defined 1.6 cm mass, axilla ultrasound negative, ultrasound-guided biopsy: Grade 2 IDC ER 90%, PR 40%, Ki-67 15%, HER-2 negative      08/09/2017 Genetic Testing    Negative genetic testing common hereditary cancer panel.  The Hereditary Gene Panel offered by [facility] includes sequencing and/or deletion duplication testing of the following 47 genes: APC, ATM, AXIN2, BARD1, BMPR1A, BRCA1, BRCA2, BRIP1, CDH1, CDK4, CDKN2A (p14ARF), CDKN2A (p16INK4a), CHEK2, CTNNA1, DICER1, EPCAM (Deletion/duplication testing only), GREM1 (promoter region deletion/duplication testing only), KIT, MEN1, MLH1, MSH2, MSH3, MSH6, MUTYH, NBN, NF1, NHTL1, PALB2, PDGFRA, PMS2, POLD1, POLE, PTEN, RAD50, RAD51C, RAD51D, SDHB, SDHC, SDHD, SMAD4, SMARCA4. STK11, TP53, TSC1, TSC2, and VHL.  The following genes were evaluated for sequence changes only: SDHA and HOXB13 c.251G>A variant only. The report date is August 07, 2017.      08/16/2017 Surgery    Left lumpectomy: IDC grade 2, 2.5 cm, DCIS intermediate grade, lymphovascular invasion identified, margins negative, 1/2 lymph nodes positive with extracapsular extension, ER 90%, PR 40%, HER-2 negative ratio 1.15, Ki-67 15% T2 N1a stage II a; Mammaprint high risk       08/27/2017 -  Chemotherapy    The patient had DOXOrubicin (ADRIAMYCIN) chemo injection 128 mg,  60 mg/m2 = 128 mg, Intravenous,  Once, 1 of 4 cycles Administration: 128 mg (09/12/2017) palonosetron (ALOXI) injection 0.25 mg, 0.25 mg, Intravenous,  Once, 1 of 4 cycles Administration: 0.25 mg (09/12/2017) pegfilgrastim-cbqv (UDENYCA) injection 6 mg, 6 mg, Subcutaneous, Once, 1 of 4 cycles Administration: 6 mg (09/13/2017) cyclophosphamide (CYTOXAN) 1,280 mg in sodium chloride 0.9 % 250 mL chemo infusion, 600 mg/m2 = 1,280 mg, Intravenous,  Once, 1 of 4 cycles Administration: 1,280 mg (09/12/2017) PACLitaxel (TAXOL) 168 mg in dextrose 5 % 250 mL chemo infusion (</= '80mg'$ /m2), 80 mg/m2 = 168 mg, Intravenous,  Once, 0 of 12 cycles fosaprepitant (EMEND) 150 mg, dexamethasone (DECADRON) 12 mg in sodium chloride 0.9 % 145 mL IVPB, , Intravenous,  Once, 1 of 4 cycles Administration:  (09/12/2017)  for chemotherapy treatment.        CURRENT THERAPY: cycle 1 day 8 of Adriamycin/Cytoxan  INTERVAL HISTORY: [name] 37 y.o. female returns for evaluation after receiving her first cycle of Adriamycin and Cytoxan.  She is happy that she tolerated treatment so well.  She has been taking her anti emetics as directed.  She is feeling well today.     Patient Active Problem List   Diagnosis Date Noted  . Port-A-Cath in place 09/12/2017  . Genetic testing 08/09/2017  . Family history of breast cancer   . Family history of colon cancer   . Family history of cancer   . Malignant neoplasm of upper-inner quadrant of left breast in female, estrogen receptor positive ([place]) 07/30/2017  . Previous cesarean section 09/05/2015  . Postpartum care following cesarean delivery with btl (4/8) 09/04/2015  . Symptomatic cholelithiasis 11/20/2011  has No Known Allergies.  MEDICAL HISTORY: Past Medical History:  Diagnosis Date  . Anxiety   . Breast cancer, left ([place]) 07/2017  . Family history of breast cancer   . Family history of colon cancer     SURGICAL HISTORY: Past Surgical History:  Procedure  Laterality Date  . BREAST LUMPECTOMY WITH RADIOACTIVE SEED AND SENTINEL LYMPH NODE BIOPSY Left 08/16/2017   Procedure: BREAST LUMPECTOMY WITH RADIOACTIVE SEED AND SENTINEL LYMPH NODE BIOPSY;  Surgeon: Excell Seltzer, MD;  Location: [place];  Service: General;  Laterality: Left;  . BREAST LUMPECTOMY WITH SENTINEL LYMPH NODE BIOPSY Left 08/16/2017  . BREAST REDUCTION SURGERY Bilateral 08/23/2017   Procedure: LEFT ONCOPLASTIC REDUCTION, RIGHT BREAST REDUCTION;  Surgeon: Irene Limbo, MD;  Location: [facility];  Service: Plastics;  Laterality: Bilateral;  . CESAREAN SECTION  03/15/2012   Procedure: CESAREAN SECTION;  Surgeon: Lovenia Kim, MD;  Location: [facility];  Service: Obstetrics;  Laterality: N/A;  Primary cesarean section with delivery of baby girl at 19. Apgars 4/7.  Marland Kitchen CESAREAN SECTION WITH BILATERAL TUBAL LIGATION Bilateral 09/03/2015   Procedure: REPEAT CESAREAN SECTION WITH BILATERAL TUBAL LIGATION;  Surgeon: Brien Few, MD;  Location: [facility];  Service: Obstetrics;  Laterality: Bilateral;  EDD: 09/10/15  . CHOLECYSTECTOMY  12/07/2011   Procedure: LAPAROSCOPIC CHOLECYSTECTOMY;  Surgeon: Harl Bowie, MD;  Location: [facility];  Service: General;  Laterality: N/A;  . PORTACATH PLACEMENT Right 09/11/2017   Procedure: INSERTION PORT-A-CATH;  Surgeon: Excell Seltzer, MD;  Location: [facility];  Service: General;  Laterality: Right;  . WISDOM TOOTH EXTRACTION  2002    SOCIAL HISTORY: Social History   Socioeconomic History  . Marital status: Married    Spouse name: Not on file  . Number of children: Not on file  . Years of education: Not on file  . Highest education level: Not on file  Occupational History  . Not on file  Social Needs  . Financial resource strain: Not on file  . Food insecurity:    Worry: Not on file    Inability: Not on file  . Transportation needs:    Medical: Not on file    Non-medical: Not on file  Tobacco Use  .  Smoking status: Former Smoker    Packs/day: 0.00    Last attempt to quit: 05/27/2014    Years since quitting: 3.3  . Smokeless tobacco: Never Used  Substance and Sexual Activity  . Alcohol use: Yes    Comment: rarely  . Drug use: Yes    Types: Marijuana    Comment: reports she occsionally uses THC when vaping   . Sexual activity: Yes  Lifestyle  . Physical activity:    Days per week: Not on file    Minutes per session: Not on file  . Stress: Not on file  Relationships  . Social connections:    Talks on phone: Not on file    Gets together: Not on file    Attends religious service: Not on file    Active member of club or organization: Not on file    Attends meetings of clubs or organizations: Not on file    Relationship status: Not on file  . Intimate partner violence:    Fear of current or ex partner: Not on file    Emotionally abused: Not on file    Physically abused: Not on file    Forced sexual activity: Not on file  Other Topics Concern  .  Not on file  Social History Narrative  . Not on file    FAMILY HISTORY: Family History  Problem Relation Age of Onset  . Diabetes Mother   . Cancer Mother 52       chondrosarcoma  . Breast cancer Mother 27       DCIS vs Atypical Hyperplasia  . Breast cancer Other        Maternal Great Aunt; MGMs sister  . Breast cancer Other        Maternal Great Aunt  . Other Maternal Grandmother        d. from complications of a fall and breaking her hip  . Alcohol abuse Maternal Grandfather   . Colon cancer Paternal Grandmother     Review of Systems  Constitutional: Positive for fatigue. Negative for appetite change, chills, fever and unexpected weight change.  HENT:   Negative for hearing loss, lump/mass, sore throat and trouble swallowing.   Eyes: Negative for eye problems and icterus.  Respiratory: Negative for chest tightness, cough and shortness of breath.   Cardiovascular: Negative for chest pain, leg swelling and palpitations.   Gastrointestinal: Negative for abdominal distention, abdominal pain, constipation, diarrhea, nausea and vomiting.  Endocrine: Negative for hot flashes.  Skin: Negative for itching and rash.  Neurological: Negative for dizziness, extremity weakness, headaches and numbness.  Hematological: Negative for adenopathy. Does not bruise/bleed easily.  Psychiatric/Behavioral: Negative for depression. The patient is not nervous/anxious.       PHYSICAL EXAMINATION  ECOG PERFORMANCE STATUS: 1 - Symptomatic but completely ambulatory  Vitals:   09/19/17 0938  BP: 108/65  Pulse: 74  Resp: 18  Temp: 98.6 F (37 C)  SpO2: 100%    Physical Exam  Constitutional: She is oriented to person, place, and time and well-developed, well-nourished, and in no distress.  HENT:  Head: Normocephalic and atraumatic.  Mouth/Throat: Oropharynx is clear and moist. No oropharyngeal exudate.  Eyes: Pupils are equal, round, and reactive to light. No scleral icterus.  Neck: Neck supple.  Cardiovascular: Normal rate, regular rhythm and normal heart sounds.  Pulmonary/Chest: Effort normal and breath sounds normal.  Abdominal: Soft. Bowel sounds are normal. She exhibits no distension and no mass. There is no tenderness. There is no rebound and no guarding.  Musculoskeletal: She exhibits no edema.  Lymphadenopathy:    She has no cervical adenopathy.  Neurological: She is alert and oriented to person, place, and time.  Skin: Skin is warm and dry. No rash noted.  Psychiatric: Mood and affect normal.    LABORATORY DATA:  CBC    Component Value Date/Time   WBC 4.2 09/19/2017 0846   WBC 7.2 09/09/2017 0953   RBC 3.95 09/19/2017 0846   HGB 11.7 09/19/2017 0846   HCT 34.6 (L) 09/19/2017 0846   PLT 136 (L) 09/19/2017 0846   MCV 87.6 09/19/2017 0846   MCH 29.6 09/19/2017 0846   MCHC 33.8 09/19/2017 0846   RDW 12.5 09/19/2017 0846   LYMPHSABS 1.1 09/19/2017 0846   MONOABS 0.1 09/19/2017 0846   EOSABS 0.7 (H)  09/19/2017 0846   BASOSABS 0.1 09/19/2017 0846    CMP     Component Value Date/Time   NA 140 09/19/2017 0846   K 4.0 09/19/2017 0846   CL 108 09/19/2017 0846   CO2 24 09/19/2017 0846   GLUCOSE 124 09/19/2017 0846   BUN 13 09/19/2017 0846   CREATININE 0.76 09/19/2017 0846   CALCIUM 9.0 09/19/2017 0846   PROT 6.4 09/19/2017 0846  ALBUMIN 3.7 09/19/2017 0846   AST 9 09/19/2017 0846   ALT 9 09/19/2017 0846   ALKPHOS 94 09/19/2017 0846   BILITOT 0.3 09/19/2017 0846   GFRNONAA >60 09/19/2017 0846   GFRAA >60 09/19/2017 0846        ASSESSMENT and PLAN:   Malignant neoplasm of upper-inner quadrant of left breast in female, estrogen receptor positive ([facility]) 08/19/2017:Left lumpectomy: IDC grade 2, 2.5 cm, DCIS intermediate grade, lymphovascular invasion identified, margins negative, 1/2 lymph nodes positive with extracapsular extension, ER 90%, PR 40%, HER-2 negative ratio 1.15, Ki-67 15% T2 N1a stage II a; Mammaprint high risk   Pathology counseling: I discussed the final pathology report of the patient provided  a copy of this report. I discussed the margins as well as lymph node surgeries. We also discussed the final staging along with previously performed ER/PR and HER-2/neu testing.  Recommendation: 1.  Adjuvant chemotherapy with dose dense Adriamycin and Cytoxan x4 followed by Taxol weekly x12, echocardiogram 09/04/17 was normal with EF of 55-60% 2. followed by adjuvant radiation therapy 3.  Followed by adjuvant antiestrogen therapy     _______________________________________________________________________________________________  [name] is doing well today.  Her labs are stable.  She is not neutropenic.  She tolerated chemotherapy very well.  We will see her next week for labs, f/u with Dr. Lindi Adie, and cycle 2 of chemotherapy.     All questions were answered. The patient knows to call the clinic with any problems, questions or concerns. We can certainly see the patient  much sooner if necessary.  A total of (20) minutes of face-to-face time was spent with this patient with greater than 50% of that time in counseling and care-coordination.  This note was electronically signed. Scot Dock, NP 09/19/2017

## 2017-09-19 NOTE — Progress Notes (Signed)
09/19/17 @ 1045  Received approval to add famotidine 20 mg IVPB to future AC treatments due to symptoms noted during cycle #1  Cytoxan infusion.  Erling Conte NP  Henreitta Leber, PharmD

## 2017-09-19 NOTE — Assessment & Plan Note (Signed)
08/19/2017:Left lumpectomy: IDC grade 2, 2.5 cm, DCIS intermediate grade, lymphovascular invasion identified, margins negative, 1/2 lymph nodes positive with extracapsular extension, ER 90%, PR 40%, HER-2 negative ratio 1.15, Ki-67 15% T2 N1a stage II a; Mammaprint high risk   Pathology counseling: I discussed the final pathology report of the patient provided  a copy of this report. I discussed the margins as well as lymph node surgeries. We also discussed the final staging along with previously performed ER/PR and HER-2/neu testing.  Recommendation: 1.  Adjuvant chemotherapy with dose dense Adriamycin and Cytoxan x4 followed by Taxol weekly x12, echocardiogram 09/04/17 was normal with EF of 55-60% 2. followed by adjuvant radiation therapy 3.  Followed by adjuvant antiestrogen therapy     _______________________________________________________________________________________________  [name] is doing well today.  Her labs are stable.  She is not neutropenic.  She tolerated chemotherapy very well.  We will see her next week for labs, f/u with Dr. Lindi Adie, and cycle 2 of chemotherapy.

## 2017-09-20 ENCOUNTER — Telehealth: Payer: Self-pay | Admitting: Adult Health

## 2017-09-20 NOTE — Telephone Encounter (Signed)
Per 4/25 no los 

## 2017-09-23 ENCOUNTER — Encounter: Payer: Self-pay | Admitting: Physical Therapy

## 2017-09-23 ENCOUNTER — Ambulatory Visit: Payer: 59 | Attending: General Surgery | Admitting: Physical Therapy

## 2017-09-23 ENCOUNTER — Other Ambulatory Visit: Payer: Self-pay

## 2017-09-23 DIAGNOSIS — Z483 Aftercare following surgery for neoplasm: Secondary | ICD-10-CM | POA: Insufficient documentation

## 2017-09-23 DIAGNOSIS — C50212 Malignant neoplasm of upper-inner quadrant of left female breast: Secondary | ICD-10-CM | POA: Diagnosis present

## 2017-09-23 DIAGNOSIS — M25612 Stiffness of left shoulder, not elsewhere classified: Secondary | ICD-10-CM | POA: Diagnosis present

## 2017-09-23 DIAGNOSIS — R293 Abnormal posture: Secondary | ICD-10-CM

## 2017-09-23 DIAGNOSIS — Z170 Estrogen receptor positive status [ER+]: Secondary | ICD-10-CM | POA: Diagnosis present

## 2017-09-23 NOTE — Therapy (Signed)
[place], [place], [address] Phone: [phone]   Fax:  [phone]  Physical Therapy Treatment  Patient Details  Name: [name] MRN: [id number] Date of Birth: 02/21/1981 Referring Provider: Dr. Excell Seltzer   Encounter Date: 09/23/2017  PT End of Session - 09/23/17 1418    Visit Number  2    Number of Visits  6    Date for PT Re-Evaluation  01/23/18    PT Start Time  1300    PT Stop Time  1415    PT Time Calculation (min)  75 min    Activity Tolerance  Patient tolerated treatment well    Behavior During Therapy  [facility] for tasks assessed/performed       Past Medical History:  Diagnosis Date  . Anxiety   . Breast cancer, left ([place]) 07/2017  . Family history of breast cancer   . Family history of colon cancer     Past Surgical History:  Procedure Laterality Date  . BREAST LUMPECTOMY WITH RADIOACTIVE SEED AND SENTINEL LYMPH NODE BIOPSY Left 08/16/2017   Procedure: BREAST LUMPECTOMY WITH RADIOACTIVE SEED AND SENTINEL LYMPH NODE BIOPSY;  Surgeon: Excell Seltzer, MD;  Location: [facility];  Service: General;  Laterality: Left;  . BREAST LUMPECTOMY WITH SENTINEL LYMPH NODE BIOPSY Left 08/16/2017  . BREAST REDUCTION SURGERY Bilateral 08/23/2017   Procedure: LEFT ONCOPLASTIC REDUCTION, RIGHT BREAST REDUCTION;  Surgeon: Irene Limbo, MD;  Location: [place] [facility];  Service: Plastics;  Laterality: Bilateral;  . CESAREAN SECTION  03/15/2012   Procedure: CESAREAN SECTION;  Surgeon: Lovenia Kim, MD;  Location: [facility];  Service: Obstetrics;  Laterality: N/A;  Primary cesarean section with delivery of baby girl at 20. Apgars 4/7.  Marland Kitchen CESAREAN SECTION WITH BILATERAL TUBAL LIGATION Bilateral 09/03/2015   Procedure: REPEAT CESAREAN SECTION WITH BILATERAL TUBAL LIGATION;  Surgeon: Brien Few, MD;  Location: [facility];  Service: Obstetrics;  Laterality: Bilateral;   EDD: 09/10/15  . CHOLECYSTECTOMY  12/07/2011   Procedure: LAPAROSCOPIC CHOLECYSTECTOMY;  Surgeon: Harl Bowie, MD;  Location: [facility];  Service: General;  Laterality: N/A;  . PORTACATH PLACEMENT Right 09/11/2017   Procedure: INSERTION PORT-A-CATH;  Surgeon: Excell Seltzer, MD;  Location: [facility];  Service: General;  Laterality: Right;  . [place] EXTRACTION  2002    There were no vitals filed for this visit.  Subjective Assessment - 09/23/17 1307    Subjective  Patient underwent a left lumpectomy and sentinel node biopsy (1/2 positive axillary nodes) on 08/16/17. She had a bilateral reduction on 08/23/17 and is pleased with her cosmetic outcome. She had a port placed on 09/11/17 and began chemo (Adriamycin, Taxol ,and Cytoxan) on 09/12/17. She will also undergo radiation and anti-estrogen therapy after chemo.    Pertinent History  Patient underwent a left lumpectomy and sentinel node biopsy (1/2 positive axillary nodes) on 08/16/17. She had a bilateral reduction on 08/23/17. Patient was diagnosed 07/23/17 with left grade 2 invasive ductal carcinoma breast cancer.  It is ER/PR positive and HEr2 negative with a ki67 of 15%.    Patient Stated Goals  Make sure my arm is ok    Currently in Pain?  No/denies         [facility] PT Assessment - 09/23/17 0001      Assessment   Medical Diagnosis  s/p left lumpectomy and SLNB    Referring Provider  Dr. Excell Seltzer    Onset Date/Surgical Date  08/16/17    Hand Dominance  Right    Prior Therapy  Baseline assessment      Precautions   Precautions  Other (comment)    Precaution Comments  Left arm lymphedema risk      Restrictions   Weight Bearing Restrictions  No      Balance Screen   Has the patient fallen in the past 6 months  No    Has the patient had a decrease in activity level because of a fear of falling?   No    Is the patient reluctant to leave their home because of a fear of falling?   No      Home Environment   Living  Environment  Private residence    Living Arrangements  Spouse/significant other;Children Husband, 5 and 2 y.o.    Available Help at Discharge  Family      Prior Function   Level of Independence  Independent    Vocation  Full time employment    Vocation Requirements  Insurance consultant; travels    Leisure  She is not exercising      Cognition   Overall Cognitive Status  Within Functional Limits for tasks assessed      Observation/Other Assessments   Observations  Incisions from lumpectomy and breast reductions appear to be healing well with no signs of infection.      Posture/Postural Control   Posture/Postural Control  Postural limitations    Postural Limitations  Rounded Shoulders;Forward head      ROM / Strength   AROM / PROM / Strength  AROM      AROM   AROM Assessment Site  Shoulder    Right/Left Shoulder  Left    Left Shoulder Extension  53 Degrees    Left Shoulder Flexion  138 Degrees    Left Shoulder ABduction  143 Degrees    Left Shoulder Internal Rotation  61 Degrees    Left Shoulder External Rotation  79 Degrees      Palpation   Palpation comment  Palpable small know area on right anterior neck which appears to be scar tissue from port placement.        LYMPHEDEMA/ONCOLOGY QUESTIONNAIRE - 09/23/17 1317      Type   Cancer Type  Left breast      Surgeries   Lumpectomy Date  08/16/17    Sentinel Lymph Node Biopsy Date  08/16/17    Number Lymph Nodes Removed  2      Treatment   Active Chemotherapy Treatment  Yes    Date  09/12/17    Past Chemotherapy Treatment  No    Active Radiation Treatment  No    Past Radiation Treatment  No    Current Hormone Treatment  No    Past Hormone Therapy  No      What other symptoms do you have   Are you Having Heaviness or Tightness  No    Are you having Pain  No    Are you having pitting edema  No    Is it Hard or Difficult finding clothes that fit  No    Do you have infections  No    Is there Decreased scar  mobility  Yes    Stemmer Sign  No      Lymphedema Assessments   Lymphedema Assessments  Upper extremities      Right Upper Extremity Lymphedema   10 cm Proximal to Olecranon Process  32.7 cm      Olecranon Process  26.9 cm    10 cm Proximal to Ulnar Styloid Process  23.8 cm    Just Proximal to Ulnar Styloid Process  17.6 cm    Across Hand at Thumb Web Space  20.3 cm    At Base of 2nd Digit  6.5 cm      Left Upper Extremity Lymphedema   10 cm Proximal to Olecranon Process  33.8 cm    Olecranon Process  27.7 cm    10 cm Proximal to Ulnar Styloid Process  24.2 cm    Just Proximal to Ulnar Styloid Process  17.5 cm    Across Hand at Thumb Web Space  19.5 cm    At Base of 2nd Digit  6.4 cm        Quick Dash - 09/23/17 0001    Open a tight or new jar  No difficulty    Do heavy household chores (wash walls, wash floors)  No difficulty    Carry a shopping bag or briefcase  Mild difficulty    Wash your back  No difficulty    Use a knife to cut food  No difficulty    Recreational activities in which you take some force or impact through your arm, shoulder, or hand (golf, hammering, tennis)  Mild difficulty    During the past week, to what extent has your arm, shoulder or hand problem interfered with your normal social activities with family, friends, neighbors, or groups?  Not at all    During the past week, to what extent has your arm, shoulder or hand problem limited your work or other regular daily activities  Not at all    Arm, shoulder, or hand pain.  None    Tingling (pins and needles) in your arm, shoulder, or hand  None    Difficulty Sleeping  No difficulty    DASH Score  4.55 %                     PT Education - 09/23/17 1415    Education provided  Yes    Education Details  Lymphedema risk reduction and HEP for shoulder post op ROM    Person(s) Educated  Patient    Methods  Explanation;Demonstration;Handout    Comprehension  Verbalized understanding;Returned  demonstration          PT Long Term Goals - 09/23/17 1427      PT LONG TERM GOAL #1   Title  Patient will be able to demonstrate she has regained shoulder ROM and function post operatively.    Time  4    Period  Months    Status  Revised      PT LONG TERM GOAL #2   Title  Patient will demonstrate active ROM flexion and abduction has returned to 160 degrees for increased ease reaching overhead.    Time  4    Period  Months    Status  New      PT LONG TERM GOAL #3   Title  Patient will report >/= 50% improvement in edema on her bilateral trunk area just below her axillae.    Time  4    Period  Months    Status  New      PT LONG TERM GOAL #4   Title  Patient will report she has returned to all normal daily tasks without difficulty.    Time  4    Period  Months      Status  New      Breast Clinic Goals - 07/31/17 1058      Patient will be able to verbalize understanding of pertinent lymphedema risk reduction practices relevant to her diagnosis specifically related to skin care.   Time  1    Period  Days    Status  Achieved      Patient will be able to return demonstrate and/or verbalize understanding of the post-op home exercise program related to regaining shoulder range of motion.   Time  1    Period  Days    Status  Achieved      Patient will be able to verbalize understanding of the importance of attending the postoperative After Breast Cancer Class for further lymphedema risk reduction education and therapeutic exercise.   Time  1    Period  Days    Status  Achieved           Plan - 09/23/17 1418    Clinical Impression Statement  Patient is physically doing well s/p left lumpectomy and sentinel node biopsy on 08/16/17. She has had her 1st round of chemo due to a high risk Mammaprint result and having 1 positive axillary lymph node. She also underwent a bilateral breast reduction 1 week after her lumpectomy on 08/23/17. She was very emotional and tearful during  her assessment today which is very understandable as she has small children (ages 50 and 78) and is fearful of long term effects of cancer treatment. She became overwhelmed during our conversation related to lymphedema risk but was assured that if it occurs, it is a manageable condition. She had some shoulder ROM limitation with flexion and abduction but will probably return to baseline when she begins the HEP given again today. She also had c/o edema on her bilateral flank region just distal to her axillae and was given compression foam to place in her bra to try to reduce swelling. She will benefit from monthly PT visits to recheck ROM and edema and ensure she is progressing in the right direction.    Rehab Potential  Excellent    Clinical Impairments Affecting Rehab Potential  None    PT Frequency  Monthy    PT Duration  -- 1x/month for 4-6 months    PT Treatment/Interventions  ADLs/Self Care Home Management;Therapeutic exercise;Patient/family education;Passive range of motion;Scar mobilization;Manual techniques;Manual lymph drainage    PT Next Visit Plan  Remeasure left shoulder ROM; remeasure circumferential measurements; check bilateral trunk edema under axillae; sign pt up for ABC class    PT Home Exercise Plan  Post op shoulder ROM HEP    Consulted and Agree with Plan of Care  Patient       Patient will benefit from skilled therapeutic intervention in order to improve the following deficits and impairments:  Decreased knowledge of precautions, Impaired UE functional use, Decreased range of motion, Postural dysfunction, Pain, Increased fascial restricitons, Increased edema  Visit Diagnosis: Malignant neoplasm of upper-inner quadrant of left breast in female, estrogen receptor positive ([place]) - Plan: PT plan of care cert/re-cert  Abnormal posture - Plan: PT plan of care cert/re-cert  Aftercare following surgery for neoplasm - Plan: PT plan of care cert/re-cert  Stiffness of left shoulder, not  elsewhere classified - Plan: PT plan of care cert/re-cert     Problem List Patient Active Problem List   Diagnosis Date Noted  . Port-A-Cath in place 09/12/2017  . Genetic testing 08/09/2017  . Family history of breast cancer   .  Family history of colon cancer   . Family history of cancer   . Malignant neoplasm of upper-inner quadrant of left breast in female, estrogen receptor positive ([place]) 07/30/2017  . Previous cesarean section 09/05/2015  . Postpartum care following cesarean delivery with btl (4/8) 09/04/2015  . Symptomatic cholelithiasis 11/20/2011    Annia Friendly, PT 09/23/17 2:40 PM  [place], [place], [address] Phone: [phone]   Fax:  [phone]  Name: QUINISHA MOULD MRN: [id number] Date of Birth: 02/25/1981

## 2017-09-25 ENCOUNTER — Encounter: Payer: Self-pay | Admitting: General Practice

## 2017-09-25 NOTE — Progress Notes (Signed)
[facility] CSW Progress Note  Date of service:  09/25/17 Time:  11 AM - 12 Noon  Met w patient in [place].  Reviewed reaction to initial chemotherapy infusion.  Patient reports "it was not as bad as I was fearing, but what about the next one?"  Pt verbalized frustration w continued reminders of cancer - has gotten pieces of information about lifestyle changes which will be permanent post treatment, frustrated that she will "have to deal with this for the rest of my life."  Normalized this reaction as many cancer survivors voice similar thoughts.  Processed ways to sort out helpful/unhelpful information.  Distinguished between intentional searching, which can be limited to positive/reliable sources vs unanticipated information/contacts w others.  Processed ways to support children as she moves through treatment - wants to attend Mothers Day program at school.  Problem solved possibilities for attending without undue exposure to germs.  Gave book, [employer], described how to help child and self develop "safe space" during times of distress.    Edwyna Shell, LCSW Clinical Social Worker Phone:  [phone]

## 2017-09-26 ENCOUNTER — Inpatient Hospital Stay: Payer: 59

## 2017-09-26 ENCOUNTER — Inpatient Hospital Stay: Payer: 59 | Attending: Hematology and Oncology

## 2017-09-26 ENCOUNTER — Inpatient Hospital Stay (HOSPITAL_BASED_OUTPATIENT_CLINIC_OR_DEPARTMENT_OTHER): Payer: 59 | Admitting: Hematology and Oncology

## 2017-09-26 ENCOUNTER — Inpatient Hospital Stay (HOSPITAL_BASED_OUTPATIENT_CLINIC_OR_DEPARTMENT_OTHER): Payer: 59 | Admitting: Medical

## 2017-09-26 DIAGNOSIS — Z170 Estrogen receptor positive status [ER+]: Principal | ICD-10-CM

## 2017-09-26 DIAGNOSIS — Z79899 Other long term (current) drug therapy: Secondary | ICD-10-CM | POA: Diagnosis not present

## 2017-09-26 DIAGNOSIS — Z5111 Encounter for antineoplastic chemotherapy: Secondary | ICD-10-CM | POA: Insufficient documentation

## 2017-09-26 DIAGNOSIS — C50212 Malignant neoplasm of upper-inner quadrant of left female breast: Secondary | ICD-10-CM

## 2017-09-26 DIAGNOSIS — R5383 Other fatigue: Secondary | ICD-10-CM | POA: Insufficient documentation

## 2017-09-26 DIAGNOSIS — Z923 Personal history of irradiation: Secondary | ICD-10-CM

## 2017-09-26 DIAGNOSIS — T8090XA Unspecified complication following infusion and therapeutic injection, initial encounter: Secondary | ICD-10-CM

## 2017-09-26 DIAGNOSIS — Z95828 Presence of other vascular implants and grafts: Secondary | ICD-10-CM

## 2017-09-26 DIAGNOSIS — Z7689 Persons encountering health services in other specified circumstances: Secondary | ICD-10-CM | POA: Diagnosis not present

## 2017-09-26 LAB — CBC WITH DIFFERENTIAL (CANCER CENTER ONLY)
BASOS ABS: 0.1 10*3/uL (ref 0.0–0.1)
BASOS PCT: 1 %
EOS PCT: 1 %
Eosinophils Absolute: 0.1 10*3/uL (ref 0.0–0.5)
HCT: 36 % (ref 34.8–46.6)
Hemoglobin: 12.2 g/dL (ref 11.6–15.9)
LYMPHS PCT: 26 %
Lymphs Abs: 1.8 10*3/uL (ref 0.9–3.3)
MCH: 29.6 pg (ref 25.1–34.0)
MCHC: 33.9 g/dL (ref 31.5–36.0)
MCV: 87.4 fL (ref 79.5–101.0)
Monocytes Absolute: 0.3 10*3/uL (ref 0.1–0.9)
Monocytes Relative: 5 %
NEUTROS ABS: 4.6 10*3/uL (ref 1.5–6.5)
Neutrophils Relative %: 67 %
PLATELETS: 172 10*3/uL (ref 145–400)
RBC: 4.12 MIL/uL (ref 3.70–5.45)
RDW: 13 % (ref 11.2–14.5)
WBC: 7 10*3/uL (ref 3.9–10.3)

## 2017-09-26 LAB — CMP (CANCER CENTER ONLY)
ALK PHOS: 100 U/L (ref 40–150)
ALT: 34 U/L (ref 0–55)
ANION GAP: 9 (ref 3–11)
AST: 17 U/L (ref 5–34)
Albumin: 4.1 g/dL (ref 3.5–5.0)
BUN: 12 mg/dL (ref 7–26)
CALCIUM: 9.4 mg/dL (ref 8.4–10.4)
CO2: 24 mmol/L (ref 22–29)
Chloride: 107 mmol/L (ref 98–109)
Creatinine: 0.81 mg/dL (ref 0.60–1.10)
GFR, Est AFR Am: >60 mL/min (ref >60)
Glucose, Bld: 114 mg/dL (ref 70–140)
POTASSIUM: 4.1 mmol/L (ref 3.5–5.1)
Sodium: 140 mmol/L (ref 136–145)
Total Bilirubin: <0.2 mg/dL — ABNORMAL LOW (ref 0.2–1.2)
Total Protein: 7.1 g/dL (ref 6.4–8.3)

## 2017-09-26 MED ORDER — FAMOTIDINE IN NACL 20-0.9 MG/50ML-% IV SOLN
INTRAVENOUS | Status: AC
  Filled 2017-09-26: qty 50

## 2017-09-26 MED ORDER — SODIUM CHLORIDE 0.9% FLUSH
10.0000 mL | INTRAVENOUS | Status: DC | PRN
  Administered 2017-09-26: 10 mL
  Filled 2017-09-26: qty 10

## 2017-09-26 MED ORDER — SODIUM CHLORIDE 0.9 % IV SOLN
600.0000 mg/m2 | Freq: Once | INTRAVENOUS | Status: AC
  Administered 2017-09-26: 1280 mg via INTRAVENOUS
  Filled 2017-09-26: qty 64

## 2017-09-26 MED ORDER — FAMOTIDINE IN NACL 20-0.9 MG/50ML-% IV SOLN
20.0000 mg | Freq: Once | INTRAVENOUS | Status: AC
  Administered 2017-09-26: 20 mg via INTRAVENOUS

## 2017-09-26 MED ORDER — FOSAPREPITANT DIMEGLUMINE INJECTION 150 MG
Freq: Once | INTRAVENOUS | Status: AC
  Administered 2017-09-26: 11:00:00 via INTRAVENOUS
  Filled 2017-09-26: qty 5

## 2017-09-26 MED ORDER — DOXORUBICIN HCL CHEMO IV INJECTION 2 MG/ML
60.0000 mg/m2 | Freq: Once | INTRAVENOUS | Status: AC
Start: 2017-09-26 — End: 2017-09-26
  Administered 2017-09-26: 128 mg via INTRAVENOUS
  Filled 2017-09-26: qty 64

## 2017-09-26 MED ORDER — PALONOSETRON HCL INJECTION 0.25 MG/5ML
0.2500 mg | Freq: Once | INTRAVENOUS | Status: AC
  Administered 2017-09-26: 0.25 mg via INTRAVENOUS

## 2017-09-26 MED ORDER — PALONOSETRON HCL INJECTION 0.25 MG/5ML
INTRAVENOUS | Status: AC
Start: 2017-09-26 — End: 2017-09-26
  Filled 2017-09-26: qty 5

## 2017-09-26 MED ORDER — SODIUM CHLORIDE 0.9 % IV SOLN
Freq: Once | INTRAVENOUS | Status: AC
  Administered 2017-09-26: 10:00:00 via INTRAVENOUS

## 2017-09-26 MED ORDER — HEPARIN SOD (PORK) LOCK FLUSH 100 UNIT/ML IV SOLN
500.0000 [IU] | Freq: Once | INTRAVENOUS | Status: AC | PRN
  Administered 2017-09-26: 500 [IU]
  Filled 2017-09-26: qty 5

## 2017-09-26 NOTE — Progress Notes (Signed)
Pt reports nose feels "sneezy". Cytoxan infusion slowed. With slowed infusion pt reports that her sinuses "feel funny or like pressure". Infusion paused and NS infusing at slowed rate. Sandi Mealy, PA notified. Pt reports nose/sinuses "feel a little better". Per Sandi Mealy, PA, pepcid infused. Cytoxan restarted 10 mins post pepcid and completed with no further complaints. Pharmacy alerted to slow infusion to 1hr with next treatment.

## 2017-09-26 NOTE — Patient Instructions (Signed)
[facility] Discharge Instructions for Patients Receiving Chemotherapy  Today you received the following chemotherapy agents Adriamycin, Cytoxan.  To help prevent nausea and vomiting after your treatment, we encourage you to take your nausea medication as prescribed.   If you develop nausea and vomiting that is not controlled by your nausea medication, call the clinic.   BELOW ARE SYMPTOMS THAT SHOULD BE REPORTED IMMEDIATELY:  *FEVER GREATER THAN 100.5 F  *CHILLS WITH OR WITHOUT FEVER  NAUSEA AND VOMITING THAT IS NOT CONTROLLED WITH YOUR NAUSEA MEDICATION  *UNUSUAL SHORTNESS OF BREATH  *UNUSUAL BRUISING OR BLEEDING  TENDERNESS IN MOUTH AND THROAT WITH OR WITHOUT PRESENCE OF ULCERS  *URINARY PROBLEMS  *BOWEL PROBLEMS  UNUSUAL RASH Items with * indicate a potential emergency and should be followed up as soon as possible.  Feel free to call the clinic should you have any questions or concerns. The clinic phone number is [phone].  Please show the CHEMO ALERT CARD at check-in to the Emergency Department and triage nurse.   

## 2017-09-26 NOTE — Progress Notes (Signed)
Patient Care Team: Jonathon Jordan, MD as PCP - General (Family Medicine)  DIAGNOSIS:  Encounter Diagnosis  Name Primary?  . Malignant neoplasm of upper-inner quadrant of left breast in female, estrogen receptor positive ([place])     SUMMARY OF ONCOLOGIC HISTORY:   Malignant neoplasm of upper-inner quadrant of left breast in female, estrogen receptor positive ([place])   07/24/2017 Initial Diagnosis    Left breast palpable lump upper inner quadrant 11 o'clock position: Ill-defined 1.6 cm mass, axilla ultrasound negative, ultrasound-guided biopsy: Grade 2 IDC ER 90%, PR 40%, Ki-67 15%, HER-2 negative      08/09/2017 Genetic Testing    Negative genetic testing common hereditary cancer panel.  The Hereditary Gene Panel offered by [facility] includes sequencing and/or deletion duplication testing of the following 47 genes: APC, ATM, AXIN2, BARD1, BMPR1A, BRCA1, BRCA2, BRIP1, CDH1, CDK4, CDKN2A (p14ARF), CDKN2A (p16INK4a), CHEK2, CTNNA1, DICER1, EPCAM (Deletion/duplication testing only), GREM1 (promoter region deletion/duplication testing only), KIT, MEN1, MLH1, MSH2, MSH3, MSH6, MUTYH, NBN, NF1, NHTL1, PALB2, PDGFRA, PMS2, POLD1, POLE, PTEN, RAD50, RAD51C, RAD51D, SDHB, SDHC, SDHD, SMAD4, SMARCA4. STK11, TP53, TSC1, TSC2, and VHL.  The following genes were evaluated for sequence changes only: SDHA and HOXB13 c.251G>A variant only. The report date is August 07, 2017.      08/16/2017 Surgery    Left lumpectomy: IDC grade 2, 2.5 cm, DCIS intermediate grade, lymphovascular invasion identified, margins negative, 1/2 lymph nodes positive with extracapsular extension, ER 90%, PR 40%, HER-2 negative ratio 1.15, Ki-67 15% T2 N1a stage II a; Mammaprint high risk       09/12/2017 -  Chemotherapy    Adjuvant chemotherapy with dose dense Adriamycin and Cytoxan x4 followed by Taxol weekly x12        CHIEF COMPLIANT: Cycle 2 dose dense Adriamycin and Cytoxan  INTERVAL HISTORY: [name] is a 40-year with  above-mentioned history of left breast cancer treated with lumpectomy and is currently on adjuvant chemotherapy today cycle 2 of dose dense Adriamycin and Cytoxan.  She tolerated cycle 1 extremely well.  She denied any nausea vomiting.  She did have fatigue as result of chemotherapy.  Did not have any mouth sores.  Denies any diarrhea constipation denies any fevers or chills.  REVIEW OF SYSTEMS:   Constitutional: Denies fevers, chills or abnormal weight loss Eyes: Denies blurriness of vision Ears, nose, mouth, throat, and face: Denies mucositis or sore throat Respiratory: Denies cough, dyspnea or wheezes Cardiovascular: Denies palpitation, chest discomfort Gastrointestinal:  Denies nausea, heartburn or change in bowel habits Skin: Denies abnormal skin rashes Lymphatics: Denies new lymphadenopathy or easy bruising Neurological:Denies numbness, tingling or new weaknesses Behavioral/Psych: Mood is stable, no new changes  Extremities: No lower extremity edema Breast:  denies any pain or lumps or nodules in either breasts All other systems were reviewed with the patient and are negative.  I have reviewed the past medical history, past surgical history, social history and family history with the patient and they are unchanged from previous note.  ALLERGIES:  has No Known Allergies.  MEDICATIONS:  Current Outpatient Medications  Medication Sig Dispense Refill  . ALPRAZolam (XANAX) 0.25 MG tablet Take 0.25 mg by mouth daily as needed for anxiety.  2  . dexamethasone (DECADRON) 4 MG tablet Take 1 tablet day after chemo and 1 tablet 2 days after with food 10 tablet 0  . escitalopram (LEXAPRO) 20 MG tablet Take 20 mg by mouth daily with supper.     Marland Kitchen HYDROcodone-acetaminophen (NORCO/VICODIN) 5-325 MG tablet  Take 1 tablet by mouth every 6 (six) hours as needed for moderate pain or severe pain. 15 tablet 0  . ibuprofen (ADVIL,MOTRIN) 200 MG tablet Take 200-800 mg by mouth every 8 (eight) hours as  needed (for pain.).     Marland Kitchen lidocaine-prilocaine (EMLA) cream Apply to affected area once 30 g 3  . LORazepam (ATIVAN) 0.5 MG tablet Take 1 tablet (0.5 mg total) by mouth at bedtime as needed (Nausea or vomiting). (Patient not taking: Reported on 09/12/2017) 30 tablet 0  . ondansetron (ZOFRAN) 8 MG tablet Take 1 tablet (8 mg total) by mouth 2 (two) times daily as needed. Start on the third day after chemotherapy. (Patient not taking: Reported on 09/12/2017) 30 tablet 1  . oxyCODONE (ROXICODONE) 5 MG immediate release tablet Take 1 tablet (5 mg total) by mouth every 4 (four) hours as needed. (Patient not taking: Reported on 09/12/2017) 20 tablet 0  . prochlorperazine (COMPAZINE) 10 MG tablet Take 1 tablet (10 mg total) by mouth every 6 (six) hours as needed (Nausea or vomiting). (Patient not taking: Reported on 09/12/2017) 30 tablet 1   No current facility-administered medications for this visit.     PHYSICAL EXAMINATION: ECOG PERFORMANCE STATUS: 1 - Symptomatic but completely ambulatory  Vitals:   09/26/17 0941  BP: 119/78  Pulse: 76  Resp: 18  Temp: 98.7 F (37.1 C)  SpO2: 100%   Filed Weights   09/26/17 0941  Weight: 213 lb 9.6 oz (96.9 kg)    GENERAL:alert, no distress and comfortable SKIN: skin color, texture, turgor are normal, no rashes or significant lesions EYES: normal, Conjunctiva are pink and non-injected, sclera clear OROPHARYNX:no exudate, no erythema and lips, buccal mucosa, and tongue normal  NECK: supple, thyroid normal size, non-tender, without nodularity LYMPH:  no palpable lymphadenopathy in the cervical, axillary or inguinal LUNGS: clear to auscultation and percussion with normal breathing effort HEART: regular rate & rhythm and no murmurs and no lower extremity edema ABDOMEN:abdomen soft, non-tender and normal bowel sounds MUSCULOSKELETAL:no cyanosis of digits and no clubbing  NEURO: alert & oriented x 3 with fluent speech, no focal motor/sensory  deficits EXTREMITIES: No lower extremity edema  LABORATORY DATA:  I have reviewed the data as listed CMP Latest Ref Rng & Units 09/26/2017 09/19/2017 09/12/2017  Glucose 70 - 140 mg/dL 114 124 120  BUN 7 - 26 mg/dL _0 Creatinine 0.60 - 1.10 mg/dL 0.81 0.76 0.85  Sodium 136 - 145 mmol/L 140 140 139  Potassium 3.5 - 5.1 mmol/L 4.1 4.0 3.5  Chloride 98 - 109 mmol/L 107 108 108  CO2 22 - 29 mmol/L _1 Calcium 8.4 - 10.4 mg/dL 9.4 9.0 9.1  Total Protein 6.4 - 8.3 g/dL 7.1 6.4 6.9  Total Bilirubin 0.2 - 1.2 mg/dL <0.2(L) 0.3 0.4  Alkaline Phos 40 - 150 U/L 100 94 78  AST 5 - 34 U/L _2 ALT 0 - 55 U/L 34 9 10    Lab Results  Component Value Date   WBC 7.0 09/26/2017   HGB 12.2 09/26/2017   HCT 36.0 09/26/2017   MCV 87.4 09/26/2017   PLT 172 09/26/2017   NEUTROABS 4.6 09/26/2017    ASSESSMENT & PLAN:  Malignant neoplasm of upper-inner quadrant of left breast in female, estrogen receptor positive ([facility]) 08/19/2017:Left lumpectomy: IDC grade 2, 2.5 cm, DCIS intermediate grade, lymphovascular invasion identified, margins negative, 1/2 lymph nodes positive with extracapsular extension, ER 90%, PR 40%, HER-2 negative ratio  1.15, Ki-67 15% T2 N1a stage II a; Mammaprint high risk   Pathology counseling: I discussed the final pathology report of the patient provided  a copy of this report. I discussed the margins as well as lymph node surgeries. We also discussed the final staging along with previously performed ER/PR and HER-2/neu testing.  Recommendation: 1.  Adjuvant chemotherapy with dose dense Adriamycin and Cytoxan x4 followed by Taxol weekly x12, echocardiogram 09/04/17 was normal with EF of 55-60% 2. followed by adjuvant radiation therapy 3.  Followed by adjuvant antiestrogen therapy  _______________________________________________________________________________________________  Current treatment: Cycle 2 dose dense Adriamycin and Cytoxan  Chemo toxicities: 1.   Fatigue  2. hair loss  Return to clinic in 2 weeks for cycle 3   No orders of the defined types were placed in this encounter.  The patient has a good understanding of the overall plan. she agrees with it. she will call with any problems that may develop before the next visit here.   Harriette Ohara, MD 09/26/17

## 2017-09-26 NOTE — Assessment & Plan Note (Signed)
08/19/2017:Left lumpectomy: IDC grade 2, 2.5 cm, DCIS intermediate grade, lymphovascular invasion identified, margins negative, 1/2 lymph nodes positive with extracapsular extension, ER 90%, PR 40%, HER-2 negative ratio 1.15, Ki-67 15% T2 N1a stage II a; Mammaprint high risk   Pathology counseling: I discussed the final pathology report of the patient provided  a copy of this report. I discussed the margins as well as lymph node surgeries. We also discussed the final staging along with previously performed ER/PR and HER-2/neu testing.  Recommendation: 1.  Adjuvant chemotherapy with dose dense Adriamycin and Cytoxan x4 followed by Taxol weekly x12, echocardiogram 09/04/17 was normal with EF of 55-60% 2. followed by adjuvant radiation therapy 3.  Followed by adjuvant antiestrogen therapy  _______________________________________________________________________________________________  Current treatment: Cycle 2 dose dense Adriamycin and Cytoxan  Chemo toxicities: 1.  Fatigue  2. hair loss  Return to clinic in 2 weeks for cycle 3

## 2017-09-27 ENCOUNTER — Inpatient Hospital Stay: Payer: 59

## 2017-09-27 VITALS — BP 135/74 | HR 82 | Temp 98.2°F | Resp 18

## 2017-09-27 DIAGNOSIS — C50212 Malignant neoplasm of upper-inner quadrant of left female breast: Secondary | ICD-10-CM

## 2017-09-27 DIAGNOSIS — Z5111 Encounter for antineoplastic chemotherapy: Secondary | ICD-10-CM | POA: Diagnosis not present

## 2017-09-27 DIAGNOSIS — Z170 Estrogen receptor positive status [ER+]: Principal | ICD-10-CM

## 2017-09-27 MED ORDER — PEGFILGRASTIM-CBQV 6 MG/0.6ML ~~LOC~~ SOSY
PREFILLED_SYRINGE | SUBCUTANEOUS | Status: AC
  Filled 2017-09-27: qty 0.6

## 2017-09-27 MED ORDER — PEGFILGRASTIM-CBQV 6 MG/0.6ML ~~LOC~~ SOSY
6.0000 mg | PREFILLED_SYRINGE | Freq: Once | SUBCUTANEOUS | Status: AC
  Administered 2017-09-27: 6 mg via SUBCUTANEOUS

## 2017-09-27 NOTE — Progress Notes (Signed)
   DATE:   09/25/2017       X CHEMO/IMMUNOTHERAPY REACTION          MD: Rogelia Rohrer      AGENT/BLOOD PRODUCT RECEIVING TODAY:    Adriamycin and Cytoxan   AGENT/BLOOD PRODUCT RECEIVING IMMEDIATELY PRIOR TO REACTION: Cytoxan   REACTION(S):  Burning of the sinuses and nose   PREMEDS:  X  Aloxi    X Dexamethasone (12 mg IV)    X Emend  INTERVENTION:  X  Pepcid (20 mg)  x 1        Review of Systems  HENT: Negative for trouble swallowing.        Burning of the sinuses and nose  Respiratory: Negative for cough, chest tightness and shortness of breath.   Cardiovascular: Negative for chest pain.    Physical Exam  Constitutional: No distress.  HENT:  Head: Normocephalic and atraumatic.  Neurological: She is alert.  Skin: She is not diaphoretic.  Psychiatric: She has a normal mood and affect. Her behavior is normal. Judgment and thought content normal.   OUTCOME: X  patient responded to intervention X Chemo/Immunotherapy restarted and completed  Sandi Mealy, MHS, PA-C

## 2017-09-30 ENCOUNTER — Telehealth: Payer: Self-pay | Admitting: *Deleted

## 2017-09-30 MED ORDER — VALACYCLOVIR HCL 500 MG PO TABS: 500.0000 mg | ORAL_TABLET | Freq: Two times a day (BID) | ORAL | 6 refills | Status: DC

## 2017-09-30 NOTE — Telephone Encounter (Signed)
Pt called with c/o fever blister on lip and inside nose. Pt relate she is prone to get them. Per [name], pt will need to start Valtrex BID. Called pt with instructions and directions.

## 2017-09-30 NOTE — Telephone Encounter (Signed)
Call received from Langley Gauss in reference to "symptoms I've developed with chemotherapy.  Last night I developed lip blisters.  They're not in the inside of my mouth but the outside on my lips.  Yes, there may be a liltle pain or itching.  I get these a lot or prone to having these but not sure if it's a problem receiving chemotherapy.  I use an OTC cream."    Sep 26, 2017 received C2 A/C, cbc-diff WNL pre-treatment.  Call transferred to collaborative to help with this request.

## 2017-10-02 ENCOUNTER — Encounter: Payer: Self-pay | Admitting: General Practice

## 2017-10-02 NOTE — Progress Notes (Signed)
[facility] CSW Progress Note  Date of Service:  10/02/17 Time:  45 - 12 PM  CSW met w patient in office, continued to process issues related to anxiety re treatment, prognosis, side effects.  Reviewed ways that anxiety functions, how to confine anxiety to predictable times of day and fill other time w soothing and/or distracting activities.  Discussed impact of illness on family, particularly children.  Will return in one week.  Edwyna Shell, LCSW Clinical Social Worker Phone:  [phone]

## 2017-10-03 ENCOUNTER — Ambulatory Visit (HOSPITAL_COMMUNITY): Payer: 59 | Admitting: Psychiatry

## 2017-10-03 ENCOUNTER — Telehealth: Payer: Self-pay | Admitting: Hematology and Oncology

## 2017-10-03 NOTE — Telephone Encounter (Signed)
Left voicemail for patient informing FMLA paperwork is complete and ready to be picked up per requested. Left paperwork at the front desk of the [facility] with Ms. Wilma.

## 2017-10-07 ENCOUNTER — Telehealth: Payer: Self-pay

## 2017-10-07 ENCOUNTER — Encounter: Payer: Self-pay | Admitting: *Deleted

## 2017-10-07 NOTE — Telephone Encounter (Signed)
Will contact pt to follow up.

## 2017-10-07 NOTE — Telephone Encounter (Signed)
Called pt to confirm her symptoms with bloody stool. Pt states that she had blood after BM yesterday and today. Asked if pt was straining or have had constipation. Pt states that she normally takes miralax but did not take it yesterday or today. This is when she noticed blood after having BM. She states that she gets hemmorhoids and have been using tucks pad for itching.   Since pt has not been having any other concerning symptoms at this time, told pt to keep monitoring BM and to take miralax daily, along with increased hydration to prevent constipation. Pt might have a hemorrhoid flare up from straining in the bathroom. Pt verbalized understanding and wanted to make sure she was okay.   Told pt that if she starts to have blood clots and bleeding even without a BM, dizziness, shortness of breath or sudden extreme fatigue with bleeding, she will need to either go to ED or call our office. Pt verbalized understanding and has no further concerns at this time.

## 2017-10-07 NOTE — Telephone Encounter (Signed)
Patient called reporting "a lot of blood when I wipe after having a bowel movement." Patient reports incident twice in two days. Bleeding subsides after wiping "a few times" Denies pain, blood clots, dizziness, or shortness of breath; however, reports "discomfort". Reports history of hemorrhoids. Currently using Tucks pads for relief. Patient questions whether bleeding is related to chemo . Please follow-up with patient.

## 2017-10-10 ENCOUNTER — Inpatient Hospital Stay: Payer: 59

## 2017-10-10 ENCOUNTER — Inpatient Hospital Stay (HOSPITAL_BASED_OUTPATIENT_CLINIC_OR_DEPARTMENT_OTHER): Payer: 59 | Admitting: Hematology and Oncology

## 2017-10-10 ENCOUNTER — Encounter: Payer: Self-pay | Admitting: *Deleted

## 2017-10-10 ENCOUNTER — Other Ambulatory Visit: Payer: Self-pay | Admitting: Hematology and Oncology

## 2017-10-10 ENCOUNTER — Telehealth: Payer: Self-pay | Admitting: Adult Health

## 2017-10-10 ENCOUNTER — Inpatient Hospital Stay (HOSPITAL_BASED_OUTPATIENT_CLINIC_OR_DEPARTMENT_OTHER): Payer: 59 | Admitting: Medical

## 2017-10-10 DIAGNOSIS — C50212 Malignant neoplasm of upper-inner quadrant of left female breast: Secondary | ICD-10-CM | POA: Diagnosis not present

## 2017-10-10 DIAGNOSIS — Z923 Personal history of irradiation: Secondary | ICD-10-CM

## 2017-10-10 DIAGNOSIS — Z7689 Persons encountering health services in other specified circumstances: Secondary | ICD-10-CM

## 2017-10-10 DIAGNOSIS — Z5111 Encounter for antineoplastic chemotherapy: Secondary | ICD-10-CM | POA: Diagnosis not present

## 2017-10-10 DIAGNOSIS — Z79899 Other long term (current) drug therapy: Secondary | ICD-10-CM

## 2017-10-10 DIAGNOSIS — Z170 Estrogen receptor positive status [ER+]: Principal | ICD-10-CM

## 2017-10-10 DIAGNOSIS — T8090XA Unspecified complication following infusion and therapeutic injection, initial encounter: Secondary | ICD-10-CM

## 2017-10-10 DIAGNOSIS — Z95828 Presence of other vascular implants and grafts: Secondary | ICD-10-CM

## 2017-10-10 LAB — CMP (CANCER CENTER ONLY)
ALT: 22 U/L (ref 0–55)
AST: 18 U/L (ref 5–34)
Albumin: 4 g/dL (ref 3.5–5.0)
Alkaline Phosphatase: 87 U/L (ref 40–150)
Anion gap: 7 (ref 3–11)
BUN: 9 mg/dL (ref 7–26)
CHLORIDE: 107 mmol/L (ref 98–109)
CO2: 25 mmol/L (ref 22–29)
Calcium: 8.8 mg/dL (ref 8.4–10.4)
Creatinine: 0.79 mg/dL (ref 0.60–1.10)
GFR, Estimated: >60 mL/min (ref >60)
Glucose, Bld: 140 mg/dL (ref 70–140)
Potassium: 3.9 mmol/L (ref 3.5–5.1)
SODIUM: 139 mmol/L (ref 136–145)
Total Bilirubin: 0.3 mg/dL (ref 0.2–1.2)
Total Protein: 6.6 g/dL (ref 6.4–8.3)

## 2017-10-10 LAB — CBC WITH DIFFERENTIAL (CANCER CENTER ONLY)
BASOS ABS: 0 10*3/uL (ref 0.0–0.1)
Basophils Relative: 1 %
EOS ABS: 0 10*3/uL (ref 0.0–0.5)
Eosinophils Relative: 1 %
HEMATOCRIT: 34.7 % — AB (ref 34.8–46.6)
Hemoglobin: 12.1 g/dL (ref 11.6–15.9)
Lymphocytes Relative: 17 %
Lymphs Abs: 1.4 10*3/uL (ref 0.9–3.3)
MCH: 30 pg (ref 25.1–34.0)
MCHC: 34.8 g/dL (ref 31.5–36.0)
MCV: 86.4 fL (ref 79.5–101.0)
MONO ABS: 0.3 10*3/uL (ref 0.1–0.9)
Monocytes Relative: 4 %
NEUTROS ABS: 6.2 10*3/uL (ref 1.5–6.5)
NEUTROS PCT: 77 %
Platelet Count: 176 10*3/uL (ref 145–400)
RBC: 4.02 MIL/uL (ref 3.70–5.45)
RDW: 13.8 % (ref 11.2–14.5)
WBC Count: 8 10*3/uL (ref 3.9–10.3)

## 2017-10-10 MED ORDER — HEPARIN SOD (PORK) LOCK FLUSH 100 UNIT/ML IV SOLN
500.0000 [IU] | Freq: Once | INTRAVENOUS | Status: AC | PRN
  Administered 2017-10-10: 500 [IU]
  Filled 2017-10-10: qty 5

## 2017-10-10 MED ORDER — DOXORUBICIN HCL CHEMO IV INJECTION 2 MG/ML
60.0000 mg/m2 | Freq: Once | INTRAVENOUS | Status: AC
  Administered 2017-10-10: 128 mg via INTRAVENOUS
  Filled 2017-10-10: qty 64

## 2017-10-10 MED ORDER — PALONOSETRON HCL INJECTION 0.25 MG/5ML
0.2500 mg | Freq: Once | INTRAVENOUS | Status: AC
  Administered 2017-10-10: 0.25 mg via INTRAVENOUS

## 2017-10-10 MED ORDER — FAMOTIDINE IN NACL 20-0.9 MG/50ML-% IV SOLN
INTRAVENOUS | Status: AC
  Filled 2017-10-10: qty 50

## 2017-10-10 MED ORDER — FAMOTIDINE IN NACL 20-0.9 MG/50ML-% IV SOLN
20.0000 mg | Freq: Once | INTRAVENOUS | Status: AC
  Administered 2017-10-10: 20 mg via INTRAVENOUS

## 2017-10-10 MED ORDER — SODIUM CHLORIDE 0.9 % IV SOLN
600.0000 mg/m2 | Freq: Once | INTRAVENOUS | Status: AC
  Administered 2017-10-10: 1280 mg via INTRAVENOUS
  Filled 2017-10-10: qty 64

## 2017-10-10 MED ORDER — DEXAMETHASONE SODIUM PHOSPHATE 10 MG/ML IJ SOLN
INTRAMUSCULAR | Status: AC
  Filled 2017-10-10: qty 1

## 2017-10-10 MED ORDER — SODIUM CHLORIDE 0.9 % IV SOLN
Freq: Once | INTRAVENOUS | Status: AC
  Administered 2017-10-10: 11:00:00 via INTRAVENOUS

## 2017-10-10 MED ORDER — FOSAPREPITANT DIMEGLUMINE INJECTION 150 MG
Freq: Once | INTRAVENOUS | Status: AC
  Administered 2017-10-10: 12:00:00 via INTRAVENOUS
  Filled 2017-10-10: qty 5

## 2017-10-10 MED ORDER — PALONOSETRON HCL INJECTION 0.25 MG/5ML
INTRAVENOUS | Status: AC
  Filled 2017-10-10: qty 5

## 2017-10-10 MED ORDER — SODIUM CHLORIDE 0.9% FLUSH
10.0000 mL | INTRAVENOUS | Status: DC | PRN
  Administered 2017-10-10: 10 mL
  Filled 2017-10-10: qty 10

## 2017-10-10 NOTE — Progress Notes (Signed)
Patient Care Team: Jonathon Jordan, MD as PCP - General (Family Medicine)  DIAGNOSIS:  Encounter Diagnosis  Name Primary?  . Malignant neoplasm of upper-inner quadrant of left breast in female, estrogen receptor positive ([facility])     SUMMARY OF ONCOLOGIC HISTORY:   Malignant neoplasm of upper-inner quadrant of left breast in female, estrogen receptor positive ([facility])   07/24/2017 Initial Diagnosis    Left breast palpable lump upper inner quadrant 11 o'clock position: Ill-defined 1.6 cm mass, axilla ultrasound negative, ultrasound-guided biopsy: Grade 2 IDC ER 90%, PR 40%, Ki-67 15%, HER-2 negative      08/09/2017 Genetic Testing    Negative genetic testing common hereditary cancer panel.  The Hereditary Gene Panel offered by [facility] includes sequencing and/or deletion duplication testing of the following 47 genes: APC, ATM, AXIN2, BARD1, BMPR1A, BRCA1, BRCA2, BRIP1, CDH1, CDK4, CDKN2A (p14ARF), CDKN2A (p16INK4a), CHEK2, CTNNA1, DICER1, EPCAM (Deletion/duplication testing only), GREM1 (promoter region deletion/duplication testing only), KIT, MEN1, MLH1, MSH2, MSH3, MSH6, MUTYH, NBN, NF1, NHTL1, PALB2, PDGFRA, PMS2, POLD1, POLE, PTEN, RAD50, RAD51C, RAD51D, SDHB, SDHC, SDHD, SMAD4, SMARCA4. STK11, TP53, TSC1, TSC2, and VHL.  The following genes were evaluated for sequence changes only: SDHA and HOXB13 c.251G>A variant only. The report date is August 07, 2017.      08/16/2017 Surgery    Left lumpectomy: IDC grade 2, 2.5 cm, DCIS intermediate grade, lymphovascular invasion identified, margins negative, 1/2 lymph nodes positive with extracapsular extension, ER 90%, PR 40%, HER-2 negative ratio 1.15, Ki-67 15% T2 N1a stage II a; Mammaprint high risk       09/12/2017 -  Chemotherapy    Adjuvant chemotherapy with dose dense Adriamycin and Cytoxan x4 followed by Taxol weekly x12        CHIEF COMPLIANT: Cycle 3 dose dense Adriamycin and Cytoxan  INTERVAL HISTORY: [name] is a 75-year with  above-mentioned history of breast cancer treated with lumpectomy and is currently on adjuvant chemotherapy with dose dense Adriamycin and Cytoxan.  Today is cycle 3 of treatment.  Overall she is tolerating chemotherapy extremely well.  Patient felt nauseated for 2 to 3 days after chemo and fatigue for 3 to 4 days denies any abdominal pain diarrhea constipation.  Denies any mouth sores.  Denies neuropathy. She also has some abdominal discomfort.  REVIEW OF SYSTEMS:   Constitutional: Denies fevers, chills or abnormal weight loss Eyes: Denies blurriness of vision Ears, nose, mouth, throat, and face: Denies mucositis or sore throat Respiratory: Denies cough, dyspnea or wheezes Cardiovascular: Denies palpitation, chest discomfort Gastrointestinal:  Denies nausea, heartburn or change in bowel habits Skin: Denies abnormal skin rashes Lymphatics: Denies new lymphadenopathy or easy bruising Neurological:Denies numbness, tingling or new weaknesses Behavioral/Psych: Mood is stable, no new changes  Extremities: No lower extremity edema  All other systems were reviewed with the patient and are negative.  I have reviewed the past medical history, past surgical history, social history and family history with the patient and they are unchanged from previous note.  ALLERGIES:  has No Known Allergies.  MEDICATIONS:  Current Outpatient Medications  Medication Sig Dispense Refill  . ALPRAZolam (XANAX) 0.25 MG tablet Take 0.25 mg by mouth daily as needed for anxiety.  2  . dexamethasone (DECADRON) 4 MG tablet Take 1 tablet day after chemo and 1 tablet 2 days after with food 10 tablet 0  . escitalopram (LEXAPRO) 20 MG tablet Take 20 mg by mouth daily with supper.     Marland Kitchen HYDROcodone-acetaminophen (NORCO/VICODIN) 5-325 MG tablet Take  1 tablet by mouth every 6 (six) hours as needed for moderate pain or severe pain. 15 tablet 0  . ibuprofen (ADVIL,MOTRIN) 200 MG tablet Take 200-800 mg by mouth every 8 (eight)  hours as needed (for pain.).     Marland Kitchen lidocaine-prilocaine (EMLA) cream Apply to affected area once 30 g 3  . LORazepam (ATIVAN) 0.5 MG tablet Take 1 tablet (0.5 mg total) by mouth at bedtime as needed (Nausea or vomiting). (Patient not taking: Reported on 09/12/2017) 30 tablet 0  . ondansetron (ZOFRAN) 8 MG tablet Take 1 tablet (8 mg total) by mouth 2 (two) times daily as needed. Start on the third day after chemotherapy. (Patient not taking: Reported on 09/12/2017) 30 tablet 1  . oxyCODONE (ROXICODONE) 5 MG immediate release tablet Take 1 tablet (5 mg total) by mouth every 4 (four) hours as needed. (Patient not taking: Reported on 09/12/2017) 20 tablet 0  . prochlorperazine (COMPAZINE) 10 MG tablet Take 1 tablet (10 mg total) by mouth every 6 (six) hours as needed (Nausea or vomiting). (Patient not taking: Reported on 09/12/2017) 30 tablet 1  . valACYclovir (VALTREX) 500 MG tablet Take 1 tablet (500 mg total) by mouth 2 (two) times daily. 60 tablet 6   No current facility-administered medications for this visit.     PHYSICAL EXAMINATION: ECOG PERFORMANCE STATUS: 1 - Symptomatic but completely ambulatory  Vitals:   10/10/17 0936  BP: 130/83  Pulse: 78  Resp: 20  Temp: 99.6 F (37.6 C)  SpO2: 100%   Filed Weights   10/10/17 0936  Weight: 213 lb 9.6 oz (96.9 kg)    GENERAL:alert, no distress and comfortable SKIN: skin color, texture, turgor are normal, no rashes or significant lesions EYES: normal, Conjunctiva are pink and non-injected, sclera clear OROPHARYNX:no exudate, no erythema and lips, buccal mucosa, and tongue normal  NECK: supple, thyroid normal size, non-tender, without nodularity LYMPH:  no palpable lymphadenopathy in the cervical, axillary or inguinal LUNGS: clear to auscultation and percussion with normal breathing effort HEART: regular rate & rhythm and no murmurs and no lower extremity edema ABDOMEN:abdomen soft, non-tender and normal bowel sounds MUSCULOSKELETAL:no  cyanosis of digits and no clubbing  NEURO: alert & oriented x 3 with fluent speech, no focal motor/sensory deficits EXTREMITIES: No lower extremity edema  LABORATORY DATA:  I have reviewed the data as listed CMP Latest Ref Rng & Units 09/26/2017 09/19/2017 09/12/2017  Glucose 70 - 140 mg/dL 114 124 120  BUN 7 - 26 mg/dL '12 13 11  '$ Creatinine 0.60 - 1.10 mg/dL 0.81 0.76 0.85  Sodium 136 - 145 mmol/L 140 140 139  Potassium 3.5 - 5.1 mmol/L 4.1 4.0 3.5  Chloride 98 - 109 mmol/L 107 108 108  CO2 22 - 29 mmol/L '24 24 23  '$ Calcium 8.4 - 10.4 mg/dL 9.4 9.0 9.1  Total Protein 6.4 - 8.3 g/dL 7.1 6.4 6.9  Total Bilirubin 0.2 - 1.2 mg/dL <0.2(L) 0.3 0.4  Alkaline Phos 40 - 150 U/L 100 94 78  AST 5 - 34 U/L '17 9 9  '$ ALT 0 - 55 U/L 34 9 10    Lab Results  Component Value Date   WBC 7.0 09/26/2017   HGB 12.2 09/26/2017   HCT 36.0 09/26/2017   MCV 87.4 09/26/2017   PLT 172 09/26/2017   NEUTROABS 4.6 09/26/2017    ASSESSMENT & PLAN:  Malignant neoplasm of upper-inner quadrant of left breast in female, estrogen receptor positive ([facility]) 08/19/2017:Left lumpectomy: IDC grade 2, 2.5 cm, DCIS  intermediate grade, lymphovascular invasion identified, margins negative, 1/2 lymph nodes positive with extracapsular extension, ER 90%, PR 40%, HER-2 negative ratio 1.15, Ki-67 15% T2 N1a stage II a; Mammaprint high risk   Pathology counseling: I discussed the final pathology report of the patient provided  a copy of this report. I discussed the margins as well as lymph node surgeries. We also discussed the final staging along with previously performed ER/PR and HER-2/neu testing.  Recommendation: 1.  Adjuvant chemotherapy with dose dense Adriamycin and Cytoxan x4 followed by Taxol weekly x12, echocardiogram 09/04/17 was normal with EF of 55-60% 2. followed by adjuvant radiation therapy 3.  Followed by adjuvant antiestrogen therapy   _______________________________________________________________________________________________  Current treatment: Cycle 3 dose dense Adriamycin and Cytoxan  Chemo toxicities: 1.  Fatigue  2. hair loss 3 abdominal discomfort: I will obtain CT chest abdomen pelvis to evaluate any evidence of metastatic disease.   Return to clinic in 2 weeks for cycle 4   No orders of the defined types were placed in this encounter.  The patient has a good understanding of the overall plan. she agrees with it. she will call with any problems that may develop before the next visit here.   Harriette Ohara, MD 10/10/17

## 2017-10-10 NOTE — Telephone Encounter (Signed)
Gave patient avs report and appointments for May thru August. Per 5/16 los patient to see VG with taxol treatment to begin 6/14 , cycle 1, 2 and then q2w. VG not in office cycle 1 and 2 (6/14 and 6/21) - patient does not want appointments separated and has been schedule with LC when VG is not in the office. Patient ok with seeing [facility] since tx is wkly and she does not want to come multiple days.

## 2017-10-10 NOTE — Assessment & Plan Note (Signed)
08/19/2017:Left lumpectomy: IDC grade 2, 2.5 cm, DCIS intermediate grade, lymphovascular invasion identified, margins negative, 1/2 lymph nodes positive with extracapsular extension, ER 90%, PR 40%, HER-2 negative ratio 1.15, Ki-67 15% T2 N1a stage II a; Mammaprint high risk   Pathology counseling: I discussed the final pathology report of the patient provided  a copy of this report. I discussed the margins as well as lymph node surgeries. We also discussed the final staging along with previously performed ER/PR and HER-2/neu testing.  Recommendation: 1.  Adjuvant chemotherapy with dose dense Adriamycin and Cytoxan x4 followed by Taxol weekly x12, echocardiogram 09/04/17 was normal with EF of 55-60% 2. followed by adjuvant radiation therapy 3.  Followed by adjuvant antiestrogen therapy  _______________________________________________________________________________________________  Current treatment: Cycle 3 dose dense Adriamycin and Cytoxan  Chemo toxicities: 1.  Fatigue  2. hair loss  Return to clinic in 2 weeks for cycle 4

## 2017-10-10 NOTE — Patient Instructions (Signed)
Cancer Center Discharge Instructions for Patients Receiving Chemotherapy  Today you received the following chemotherapy agents Adriamycin, Cytoxan.  To help prevent nausea and vomiting after your treatment, we encourage you to take your nausea medication as prescribed.   If you develop nausea and vomiting that is not controlled by your nausea medication, call the clinic.   BELOW ARE SYMPTOMS THAT SHOULD BE REPORTED IMMEDIATELY:  *FEVER GREATER THAN 100.5 F  *CHILLS WITH OR WITHOUT FEVER  NAUSEA AND VOMITING THAT IS NOT CONTROLLED WITH YOUR NAUSEA MEDICATION  *UNUSUAL SHORTNESS OF BREATH  *UNUSUAL BRUISING OR BLEEDING  TENDERNESS IN MOUTH AND THROAT WITH OR WITHOUT PRESENCE OF ULCERS  *URINARY PROBLEMS  *BOWEL PROBLEMS  UNUSUAL RASH Items with * indicate a potential emergency and should be followed up as soon as possible.  Feel free to call the clinic should you have any questions or concerns. The clinic phone number is [phone].  Please show the CHEMO ALERT CARD at check-in to the Emergency Department and triage nurse.   

## 2017-10-11 ENCOUNTER — Inpatient Hospital Stay: Payer: 59

## 2017-10-11 VITALS — BP 142/85 | HR 75 | Temp 98.3°F | Resp 18

## 2017-10-11 DIAGNOSIS — C50212 Malignant neoplasm of upper-inner quadrant of left female breast: Secondary | ICD-10-CM

## 2017-10-11 DIAGNOSIS — Z170 Estrogen receptor positive status [ER+]: Principal | ICD-10-CM

## 2017-10-11 DIAGNOSIS — Z5111 Encounter for antineoplastic chemotherapy: Secondary | ICD-10-CM | POA: Diagnosis not present

## 2017-10-11 MED ORDER — PEGFILGRASTIM-CBQV 6 MG/0.6ML ~~LOC~~ SOSY
6.0000 mg | PREFILLED_SYRINGE | Freq: Once | SUBCUTANEOUS | Status: AC
  Administered 2017-10-11: 6 mg via SUBCUTANEOUS

## 2017-10-11 NOTE — Progress Notes (Signed)
   DATE: 10/10/2017      X  CHEMO/IMMUNOTHERAPY REACTION           MD:  Lindi Adie   AGENT/BLOOD PRODUCT RECEIVING TODAY:   Adriamycin and Cytoxan  AGENT/BLOOD PRODUCT RECEIVING IMMEDIATELY PRIOR TO REACTION: Cytoxan  REACTION(S): Burning of the nose and sinuses.  PREMEDS:  Aloxi, Emend, dexamethasone  INTERVENTION:  Pepcid 20 mg IV x1  Review of Systems  Constitutional: Negative for diaphoresis.  HENT: Positive for sinus pain. Negative for trouble swallowing.        Sinus and nose burning  Respiratory: Negative for cough, choking, chest tightness, shortness of breath and wheezing.   Cardiovascular: Negative for chest pain and palpitations.  Gastrointestinal: Negative for nausea and vomiting.  Musculoskeletal: Negative for back pain.  Skin: Negative for color change and rash.  Neurological: Negative for dizziness, light-headedness and headaches.    Physical Exam  Constitutional: No distress.  HENT:  Head: Normocephalic and atraumatic.  Neurological: She is alert.  Skin: She is not diaphoretic.  Psychiatric: She has a normal mood and affect. Her behavior is normal. Judgment and thought content normal.     OUTCOME: Patient responded to intervention. Cytoxan restarted and completed.   Sandi Mealy, MHS, PA-C

## 2017-10-11 NOTE — Patient Instructions (Signed)
Pegfilgrastim injection What is this medicine? PEGFILGRASTIM (PEG fil gra stim) is a long-acting granulocyte colony-stimulating factor that stimulates the growth of neutrophils, a type of white blood cell important in the body's fight against infection. It is used to reduce the incidence of fever and infection in patients with certain types of cancer who are receiving chemotherapy that affects the bone marrow, and to increase survival after being exposed to high doses of radiation. This medicine may be used for other purposes; ask your health care provider or pharmacist if you have questions. COMMON BRAND NAME(S): Neulasta What should I tell my health care provider before I take this medicine? They need to know if you have any of these conditions: -kidney disease -latex allergy -ongoing radiation therapy -sickle cell disease -skin reactions to acrylic adhesives (On-Body Injector only) -an unusual or allergic reaction to pegfilgrastim, filgrastim, other medicines, foods, dyes, or preservatives -pregnant or trying to get pregnant -breast-feeding How should I use this medicine? This medicine is for injection under the skin. If you get this medicine at home, you will be taught how to prepare and give the pre-filled syringe or how to use the On-body Injector. Refer to the patient Instructions for Use for detailed instructions. Use exactly as directed. Tell your healthcare provider immediately if you suspect that the On-body Injector may not have performed as intended or if you suspect the use of the On-body Injector resulted in a missed or partial dose. It is important that you put your used needles and syringes in a special sharps container. Do not put them in a trash can. If you do not have a sharps container, call your pharmacist or healthcare provider to get one. Talk to your pediatrician regarding the use of this medicine in children. While this drug may be prescribed for selected conditions,  precautions do apply. Overdosage: If you think you have taken too much of this medicine contact a poison control center or emergency room at once. NOTE: This medicine is only for you. Do not share this medicine with others. What if I miss a dose? It is important not to miss your dose. Call your doctor or health care professional if you miss your dose. If you miss a dose due to an On-body Injector failure or leakage, a new dose should be administered as soon as possible using a single prefilled syringe for manual use. What may interact with this medicine? Interactions have not been studied. Give your health care provider a list of all the medicines, herbs, non-prescription drugs, or dietary supplements you use. Also tell them if you smoke, drink alcohol, or use illegal drugs. Some items may interact with your medicine. This list may not describe all possible interactions. Give your health care provider a list of all the medicines, herbs, non-prescription drugs, or dietary supplements you use. Also tell them if you smoke, drink alcohol, or use illegal drugs. Some items may interact with your medicine. What should I watch for while using this medicine? You may need blood work done while you are taking this medicine. If you are going to need a MRI, CT scan, or other procedure, tell your doctor that you are using this medicine (On-Body Injector only). What side effects may I notice from receiving this medicine? Side effects that you should report to your doctor or health care professional as soon as possible: -allergic reactions like skin rash, itching or hives, swelling of the face, lips, or tongue -dizziness -fever -pain, redness, or irritation at site   where injected -pinpoint red spots on the skin -red or dark-brown urine -shortness of breath or breathing problems -stomach or side pain, or pain at the shoulder -swelling -tiredness -trouble passing urine or change in the amount of urine Side  effects that usually do not require medical attention (report to your doctor or health care professional if they continue or are bothersome): -bone pain -muscle pain This list may not describe all possible side effects. Call your doctor for medical advice about side effects. You may report side effects to FDA at 1-800-FDA-1088. Where should I keep my medicine? Keep out of the reach of children. Store pre-filled syringes in a refrigerator between 2 and 8 degrees C (36 and 46 degrees F). Do not freeze. Keep in carton to protect from light. Throw away this medicine if it is left out of the refrigerator for more than 48 hours. Throw away any unused medicine after the expiration date. NOTE: This sheet is a summary. It may not cover all possible information. If you have questions about this medicine, talk to your doctor, pharmacist, or health care provider.  2018 Elsevier/Gold Standard (2016-05-10 12:58:03)  

## 2017-10-14 ENCOUNTER — Telehealth: Payer: Self-pay

## 2017-10-14 NOTE — Telephone Encounter (Signed)
Called pt to confirm her appt for CT scans this week. Pt aware of NPO status 4hrs prior to scan and to drink first contrast bottle 2hrs prior and 2nd bottle 1 hr prior to CT. PT confirmed instructions and time/date of appt.

## 2017-10-17 ENCOUNTER — Encounter: Payer: Self-pay | Admitting: Physical Therapy

## 2017-10-17 ENCOUNTER — Ambulatory Visit (HOSPITAL_COMMUNITY)
Admission: RE | Admit: 2017-10-17 | Discharge: 2017-10-17 | Disposition: A | Discharge status: Home / Self Care | Payer: 59 | Source: Ambulatory Visit | Attending: Hematology and Oncology | Admitting: Hematology and Oncology

## 2017-10-17 ENCOUNTER — Ambulatory Visit: Payer: 59 | Attending: General Surgery | Admitting: Physical Therapy

## 2017-10-17 ENCOUNTER — Telehealth: Payer: Self-pay | Admitting: Hematology and Oncology

## 2017-10-17 DIAGNOSIS — Z170 Estrogen receptor positive status [ER+]: Secondary | ICD-10-CM | POA: Diagnosis present

## 2017-10-17 DIAGNOSIS — C50212 Malignant neoplasm of upper-inner quadrant of left female breast: Secondary | ICD-10-CM | POA: Diagnosis present

## 2017-10-17 DIAGNOSIS — M25612 Stiffness of left shoulder, not elsewhere classified: Secondary | ICD-10-CM

## 2017-10-17 DIAGNOSIS — Z483 Aftercare following surgery for neoplasm: Secondary | ICD-10-CM | POA: Insufficient documentation

## 2017-10-17 DIAGNOSIS — R600 Localized edema: Secondary | ICD-10-CM

## 2017-10-17 DIAGNOSIS — R293 Abnormal posture: Secondary | ICD-10-CM | POA: Diagnosis present

## 2017-10-17 MED ORDER — IOPAMIDOL (ISOVUE-300) INJECTION 61%
INTRAVENOUS | Status: AC
  Filled 2017-10-17: qty 100

## 2017-10-17 MED ORDER — IOPAMIDOL (ISOVUE-300) INJECTION 61%
100.0000 mL | Freq: Once | INTRAVENOUS | Status: AC | PRN
  Administered 2017-10-17: 100 mL via INTRAVENOUS

## 2017-10-17 NOTE — Telephone Encounter (Signed)
I called the patient to inform her that the CT chest abdomen pelvis did not reveal any evidence of metastatic disease.

## 2017-10-17 NOTE — Therapy (Signed)
[place], [place], [address] Phone: [phone]   Fax:  [phone]  Physical Therapy Treatment  Patient Details  Name: [name] MRN: [id number] Date of Birth: 08-Dec-1980 Referring Provider: Dr. Excell Seltzer   Encounter Date: 10/17/2017  PT End of Session - 10/17/17 1012    Visit Number  3    Number of Visits  6    Date for PT Re-Evaluation  01/23/18    PT Start Time  0932    PT Stop Time  1008    PT Time Calculation (min)  36 min    Activity Tolerance  Patient tolerated treatment well    Behavior During Therapy  [facility] for tasks assessed/performed       Past Medical History:  Diagnosis Date  . Anxiety   . Breast cancer, left ([place]) 07/2017  . Family history of breast cancer   . Family history of colon cancer     Past Surgical History:  Procedure Laterality Date  . BREAST LUMPECTOMY WITH RADIOACTIVE SEED AND SENTINEL LYMPH NODE BIOPSY Left 08/16/2017   Procedure: BREAST LUMPECTOMY WITH RADIOACTIVE SEED AND SENTINEL LYMPH NODE BIOPSY;  Surgeon: Excell Seltzer, MD;  Location: [facility];  Service: General;  Laterality: Left;  . BREAST LUMPECTOMY WITH SENTINEL LYMPH NODE BIOPSY Left 08/16/2017  . BREAST REDUCTION SURGERY Bilateral 08/23/2017   Procedure: LEFT ONCOPLASTIC REDUCTION, RIGHT BREAST REDUCTION;  Surgeon: Irene Limbo, MD;  Location: [facility];  Service: Plastics;  Laterality: Bilateral;  . CESAREAN SECTION  03/15/2012   Procedure: CESAREAN SECTION;  Surgeon: Lovenia Kim, MD;  Location: [facility];  Service: Obstetrics;  Laterality: N/A;  Primary cesarean section with delivery of baby girl at 11. Apgars 4/7.  Marland Kitchen CESAREAN SECTION WITH BILATERAL TUBAL LIGATION Bilateral 09/03/2015   Procedure: REPEAT CESAREAN SECTION WITH BILATERAL TUBAL LIGATION;  Surgeon: Brien Few, MD;  Location: [facility];  Service: Obstetrics;  Laterality: Bilateral;   EDD: 09/10/15  . CHOLECYSTECTOMY  12/07/2011   Procedure: LAPAROSCOPIC CHOLECYSTECTOMY;  Surgeon: Harl Bowie, MD;  Location: [facility];  Service: General;  Laterality: N/A;  . PORTACATH PLACEMENT Right 09/11/2017   Procedure: INSERTION PORT-A-CATH;  Surgeon: Excell Seltzer, MD;  Location: [facility];  Service: General;  Laterality: Right;  . [place] EXTRACTION  2002    There were no vitals filed for this visit.  Subjective Assessment - 10/17/17 0941    Subjective  I've had 3 chemo treatment and I'm doing well. I feel tired the first 3-5 days but overall doing well. She is having a chest and abdominal CT Scan done today to ensure no cancer has spread. I feel like my shoulders are doing better.    Pertinent History  Patient underwent a left lumpectomy and sentinel node biopsy (1/2 positive axillary nodes) on 08/16/17. She had a bilateral reduction on 08/23/17. Patient was diagnosed 07/23/17 with left grade 2 invasive ductal carcinoma breast cancer.  It is ER/PR positive and HEr2 negative with a ki67 of 15%.    Patient Stated Goals  Make sure my arm is ok    Currently in Pain?  No/denies         [facility] PT Assessment - 10/17/17 0001      Observation/Other Assessments   Observations  Swelling still present on right lateral trunk under axilla. She reports she tried the foam but it did not help.      AROM   Right/Left Shoulder  Left    Left Shoulder Extension  59 Degrees    Left Shoulder Flexion  158 Degrees    Left Shoulder ABduction  158 Degrees    Left Shoulder Internal Rotation  58 Degrees    Left Shoulder External Rotation  70 Degrees        LYMPHEDEMA/ONCOLOGY QUESTIONNAIRE - 10/17/17 0949      Right Upper Extremity Lymphedema   10 cm Proximal to Olecranon Process  33.4 cm    Olecranon Process  27.2 cm    10 cm Proximal to Ulnar Styloid Process  24.5 cm    Just Proximal to Ulnar Styloid Process  17.5 cm    Across Hand at [employer]  19.9 cm    At [place] of 2nd Digit   6.2 cm      Left Upper Extremity Lymphedema   10 cm Proximal to Olecranon Process  34.3 cm    Olecranon Process  27.5 cm    10 cm Proximal to Ulnar Styloid Process  24.4 cm    Just Proximal to Ulnar Styloid Process  17.3 cm    Across Hand at [employer]  18.9 cm    At [place] of 2nd Digit  6.2 cm        Quick Dash - 10/17/17 0001    Open a tight or new jar  No difficulty    Do heavy household chores (wash walls, wash floors)  No difficulty    Carry a shopping bag or briefcase  No difficulty    Wash your back  No difficulty    Use a knife to cut food  No difficulty    Recreational activities in which you take some force or impact through your arm, shoulder, or hand (golf, hammering, tennis)  No difficulty    During the past week, to what extent has your arm, shoulder or hand problem interfered with your normal social activities with family, friends, neighbors, or groups?  Not at all    During the past week, to what extent has your arm, shoulder or hand problem limited your work or other regular daily activities  Not at all    Arm, shoulder, or hand pain.  None    Tingling (pins and needles) in your arm, shoulder, or hand  None    Difficulty Sleeping  No difficulty    DASH Score  0 %             [facility] Adult PT Treatment/Exercise - 10/17/17 0001      Manual Therapy   Manual therapy comments  Issued foam chip pack with varied densities of foam in stockinette today for her right lateral proximal trunk. It was placed between her skin and bra to try to reduce swelling.             PT Education - 10/17/17 1011    Education provided  Yes    Education Details  Instructed pt to keep chip pack in place and wear compression bra. If symptoms do not reduce, encouraged her to call us and we can begin MLD.    Person(s) Educated  Patient    Methods  Explanation    Comprehension  Verbalized understanding          PT Long Term Goals - 10/17/17 1016      PT LONG TERM GOAL #1    Title  Patient will be able to demonstrate she has regained shoulder ROM and function post operatively.  Time  4    Period  Months    Status  Achieved      PT LONG TERM GOAL #2   Title  Patient will demonstrate active ROM flexion and abduction has returned to 160 degrees for increased ease reaching overhead.    Time  4    Period  Months    Status  On-going      PT LONG TERM GOAL #3   Title  Patient will report >/= 50% improvement in edema on her bilateral trunk area just below her axillae.    Time  4    Period  Months    Status  On-going      PT LONG TERM GOAL #4   Title  Patient will report she has returned to all normal daily tasks without difficulty.    Time  4    Period  Months    Status  Achieved      Breast Clinic Goals - 07/31/17 1058      Patient will be able to verbalize understanding of pertinent lymphedema risk reduction practices relevant to her diagnosis specifically related to skin care.   Time  1    Period  Days    Status  Achieved      Patient will be able to return demonstrate and/or verbalize understanding of the post-op home exercise program related to regaining shoulder range of motion.   Time  1    Period  Days    Status  Achieved      Patient will be able to verbalize understanding of the importance of attending the postoperative After Breast Cancer Class for further lymphedema risk reduction education and therapeutic exercise.   Time  1    Period  Days    Status  Achieved           Plan - 10/17/17 1012    Clinical Impression Statement  Patient is doing much better today and reports feeling upbeat. Her shoulder ROM has returned to baseline and she is pleased with that. There continues to be no sign of lymphedema. She does have swelling present on her right proximal lateral trunk that was treated with a chip pack today. She was encouraged to contact us if that does not improve. She has concerns about chemo induced peripheral neuropathy as she  will begin Taxol in a few weeks. Encouraged her to monitor herself for symptoms and report those to her medical oncologist if they occur. Also encouraged her to begin a walking program ot promote circulation.    Rehab Potential  Excellent    PT Frequency  Monthy    PT Treatment/Interventions  ADLs/Self Care Home Management;Therapeutic exercise;Patient/family education;Passive range of motion;Scar mobilization;Manual techniques;Manual lymph drainage    PT Next Visit Plan  Remeasure BUE circumference. Assess right lateral trunk edema to see if that needs to be addressed and begin manual lymph drainage if needed. Assess for signs of CIPN. Will be about half way through 12 Taxol treatments at next visit.    PT Home Exercise Plan  Post op shoulder ROM HEP    Consulted and Agree with Plan of Care  Patient       Patient will benefit from skilled therapeutic intervention in order to improve the following deficits and impairments:  Decreased knowledge of precautions, Impaired UE functional use, Decreased range of motion, Postural dysfunction, Pain, Increased fascial restricitons, Increased edema  Visit Diagnosis: Malignant neoplasm of upper-inner quadrant of left breast in female, estrogen receptor  positive ([place])  Abnormal posture  Aftercare following surgery for neoplasm  Stiffness of left shoulder, not elsewhere classified  Localized edema     Problem List Patient Active Problem List   Diagnosis Date Noted  . Port-A-Cath in place 09/12/2017  . Genetic testing 08/09/2017  . Family history of breast cancer   . Family history of colon cancer   . Family history of cancer   . Malignant neoplasm of upper-inner quadrant of left breast in female, estrogen receptor positive ([place]) 07/30/2017  . Previous cesarean section 09/05/2015  . Postpartum care following cesarean delivery with btl (4/8) 09/04/2015  . Symptomatic cholelithiasis 11/20/2011    Annia Friendly, PT 10/17/17 10:18  AM  [place], [place], [address] Phone: [phone]   Fax:  [phone]  Name: [name] MRN: [id number] Date of Birth: 1981/01/22

## 2017-10-24 ENCOUNTER — Telehealth: Payer: Self-pay

## 2017-10-24 ENCOUNTER — Other Ambulatory Visit: Payer: Self-pay

## 2017-10-24 ENCOUNTER — Inpatient Hospital Stay: Payer: 59

## 2017-10-24 ENCOUNTER — Encounter: Payer: Self-pay | Admitting: *Deleted

## 2017-10-24 ENCOUNTER — Inpatient Hospital Stay (HOSPITAL_BASED_OUTPATIENT_CLINIC_OR_DEPARTMENT_OTHER): Payer: 59 | Admitting: Hematology and Oncology

## 2017-10-24 ENCOUNTER — Inpatient Hospital Stay (HOSPITAL_BASED_OUTPATIENT_CLINIC_OR_DEPARTMENT_OTHER): Payer: 59 | Admitting: Medical

## 2017-10-24 DIAGNOSIS — Z5111 Encounter for antineoplastic chemotherapy: Secondary | ICD-10-CM

## 2017-10-24 DIAGNOSIS — C50212 Malignant neoplasm of upper-inner quadrant of left female breast: Secondary | ICD-10-CM

## 2017-10-24 DIAGNOSIS — Z170 Estrogen receptor positive status [ER+]: Principal | ICD-10-CM

## 2017-10-24 DIAGNOSIS — Z7689 Persons encountering health services in other specified circumstances: Secondary | ICD-10-CM

## 2017-10-24 DIAGNOSIS — Z923 Personal history of irradiation: Secondary | ICD-10-CM | POA: Diagnosis not present

## 2017-10-24 DIAGNOSIS — Z79899 Other long term (current) drug therapy: Secondary | ICD-10-CM | POA: Diagnosis not present

## 2017-10-24 DIAGNOSIS — R5383 Other fatigue: Secondary | ICD-10-CM | POA: Diagnosis not present

## 2017-10-24 DIAGNOSIS — T8090XA Unspecified complication following infusion and therapeutic injection, initial encounter: Secondary | ICD-10-CM

## 2017-10-24 LAB — CBC WITH DIFFERENTIAL (CANCER CENTER ONLY)
BASOS ABS: 0.1 10*3/uL (ref 0.0–0.1)
BASOS PCT: 1 %
Eosinophils Absolute: 0.1 10*3/uL (ref 0.0–0.5)
Eosinophils Relative: 1 %
HEMATOCRIT: 31.7 % — AB (ref 34.8–46.6)
HEMOGLOBIN: 11.2 g/dL — AB (ref 11.6–15.9)
LYMPHS PCT: 11 %
Lymphs Abs: 1.2 10*3/uL (ref 0.9–3.3)
MCH: 30.9 pg (ref 25.1–34.0)
MCHC: 35.3 g/dL (ref 31.5–36.0)
MCV: 87.4 fL (ref 79.5–101.0)
MONO ABS: 0.5 10*3/uL (ref 0.1–0.9)
Monocytes Relative: 5 %
NEUTROS ABS: 8.6 10*3/uL — AB (ref 1.5–6.5)
NEUTROS PCT: 82 %
Platelet Count: 167 10*3/uL (ref 145–400)
RBC: 3.62 MIL/uL — AB (ref 3.70–5.45)
RDW: 14.8 % — AB (ref 11.2–14.5)
WBC: 10.6 10*3/uL — AB (ref 3.9–10.3)

## 2017-10-24 LAB — CMP (CANCER CENTER ONLY)
ALK PHOS: 88 U/L (ref 40–150)
ALT: 21 U/L (ref 0–55)
ANION GAP: 7 (ref 3–11)
AST: 16 U/L (ref 5–34)
Albumin: 3.7 g/dL (ref 3.5–5.0)
BILIRUBIN TOTAL: 0.2 mg/dL (ref 0.2–1.2)
BUN: 9 mg/dL (ref 7–26)
CALCIUM: 8.4 mg/dL (ref 8.4–10.4)
CO2: 25 mmol/L (ref 22–29)
Chloride: 107 mmol/L (ref 98–109)
Creatinine: 0.72 mg/dL (ref 0.60–1.10)
Glucose, Bld: 120 mg/dL (ref 70–140)
POTASSIUM: 4.1 mmol/L (ref 3.5–5.1)
Sodium: 139 mmol/L (ref 136–145)
TOTAL PROTEIN: 6.3 g/dL — AB (ref 6.4–8.3)

## 2017-10-24 MED ORDER — DOXORUBICIN HCL CHEMO IV INJECTION 2 MG/ML
60.0000 mg/m2 | Freq: Once | INTRAVENOUS | Status: AC
  Administered 2017-10-24: 128 mg via INTRAVENOUS
  Filled 2017-10-24: qty 64

## 2017-10-24 MED ORDER — SODIUM CHLORIDE 0.9 % IV SOLN
Freq: Once | INTRAVENOUS | Status: AC
  Administered 2017-10-24: 11:00:00 via INTRAVENOUS
  Filled 2017-10-24: qty 5

## 2017-10-24 MED ORDER — FAMOTIDINE IN NACL 20-0.9 MG/50ML-% IV SOLN
20.0000 mg | Freq: Once | INTRAVENOUS | Status: AC
  Administered 2017-10-24: 20 mg via INTRAVENOUS

## 2017-10-24 MED ORDER — FAMOTIDINE IN NACL 20-0.9 MG/50ML-% IV SOLN: 20.0000 mg | Freq: Once | INTRAVENOUS | Status: DC

## 2017-10-24 MED ORDER — SODIUM CHLORIDE 0.9 % IV SOLN
Freq: Once | INTRAVENOUS | Status: AC
  Administered 2017-10-24: 11:00:00 via INTRAVENOUS

## 2017-10-24 MED ORDER — FAMOTIDINE IN NACL 20-0.9 MG/50ML-% IV SOLN
20.0000 mg | Freq: Two times a day (BID) | INTRAVENOUS | Status: DC
  Administered 2017-10-24: 20 mg via INTRAVENOUS

## 2017-10-24 MED ORDER — FAMOTIDINE IN NACL 20-0.9 MG/50ML-% IV SOLN
INTRAVENOUS | Status: AC
  Filled 2017-10-24: qty 50

## 2017-10-24 MED ORDER — HEPARIN SOD (PORK) LOCK FLUSH 100 UNIT/ML IV SOLN
500.0000 [IU] | Freq: Once | INTRAVENOUS | Status: AC | PRN
  Administered 2017-10-24: 500 [IU]
  Filled 2017-10-24: qty 5

## 2017-10-24 MED ORDER — SODIUM CHLORIDE 0.9 % IV SOLN
600.0000 mg/m2 | Freq: Once | INTRAVENOUS | Status: AC
  Administered 2017-10-24: 1280 mg via INTRAVENOUS
  Filled 2017-10-24: qty 64

## 2017-10-24 MED ORDER — SODIUM CHLORIDE 0.9% FLUSH
10.0000 mL | INTRAVENOUS | Status: DC | PRN
  Administered 2017-10-24: 10 mL
  Filled 2017-10-24: qty 10

## 2017-10-24 MED ORDER — PALONOSETRON HCL INJECTION 0.25 MG/5ML
INTRAVENOUS | Status: AC
Start: 2017-10-24 — End: ?
  Filled 2017-10-24: qty 5

## 2017-10-24 MED ORDER — PALONOSETRON HCL INJECTION 0.25 MG/5ML
0.2500 mg | Freq: Once | INTRAVENOUS | Status: AC
  Administered 2017-10-24: 0.25 mg via INTRAVENOUS

## 2017-10-24 NOTE — Assessment & Plan Note (Signed)
08/19/2017:Left lumpectomy: IDC grade 2, 2.5 cm, DCIS intermediate grade, lymphovascular invasion identified, margins negative, 1/2 lymph nodes positive with extracapsular extension, ER 90%, PR 40%, HER-2 negative ratio 1.15, Ki-67 15% T2 N1a stage II a; Mammaprint high risk   Pathology counseling: I discussed the final pathology report of the patient provided a copy of this report. I discussed the margins as well as lymph node surgeries. We also discussed the final staging along with previously performed ER/PR and HER-2/neu testing.  Recommendation: 1. Adjuvant chemotherapy with dose dense Adriamycin and Cytoxan x4 followed by Taxol weekly x12, echocardiogram 09/04/17 was normal with EF of 55-60% 2. followed by adjuvant radiation therapy 3. Followed by adjuvant antiestrogen therapy  _______________________________________________________________________________________________  Current treatment: Cycle 4 dose dense Adriamycin and Cytoxan  Chemo toxicities: 1.  Fatigue  2. hair loss 3 abdominal discomfort: CT chest abdomen pelvis did not show any evidence of metastatic disease.   Return to clinic in 2 weeks for cycle 1 Taxol

## 2017-10-24 NOTE — Telephone Encounter (Signed)
Per Dr Lindi Adie - pt needs to be scheduled for a echocardiogram for next week. Appt made for Tues June 4 arrival 9:45 am.  Notified pt via phone. Voiced understanding.

## 2017-10-24 NOTE — Progress Notes (Signed)
Per Dr. Lindi Adie, ok to use [facility] with termination at brachiocephalic junction.

## 2017-10-24 NOTE — Patient Instructions (Signed)
[facility] Discharge Instructions for Patients Receiving Chemotherapy  Today you received the following chemotherapy agents Adriamycin, Cytoxan.  To help prevent nausea and vomiting after your treatment, we encourage you to take your nausea medication as prescribed.   If you develop nausea and vomiting that is not controlled by your nausea medication, call the clinic.   BELOW ARE SYMPTOMS THAT SHOULD BE REPORTED IMMEDIATELY:  *FEVER GREATER THAN 100.5 F  *CHILLS WITH OR WITHOUT FEVER  NAUSEA AND VOMITING THAT IS NOT CONTROLLED WITH YOUR NAUSEA MEDICATION  *UNUSUAL SHORTNESS OF BREATH  *UNUSUAL BRUISING OR BLEEDING  TENDERNESS IN MOUTH AND THROAT WITH OR WITHOUT PRESENCE OF ULCERS  *URINARY PROBLEMS  *BOWEL PROBLEMS  UNUSUAL RASH Items with * indicate a potential emergency and should be followed up as soon as possible.  Feel free to call the clinic should you have any questions or concerns. The clinic phone number is [phone].  Please show the CHEMO ALERT CARD at check-in to the Emergency Department and triage nurse.   

## 2017-10-24 NOTE — Progress Notes (Signed)
Patient Care Team: Jonathon Jordan, MD as PCP - General (Family Medicine)  DIAGNOSIS:  Encounter Diagnosis  Name Primary?  . Malignant neoplasm of upper-inner quadrant of left breast in female, estrogen receptor positive ([place])     SUMMARY OF ONCOLOGIC HISTORY:   Malignant neoplasm of upper-inner quadrant of left breast in female, estrogen receptor positive ([place])   07/24/2017 Initial Diagnosis    Left breast palpable lump upper inner quadrant 11 o'clock position: Ill-defined 1.6 cm mass, axilla ultrasound negative, ultrasound-guided biopsy: Grade 2 IDC ER 90%, PR 40%, Ki-67 15%, HER-2 negative      08/09/2017 Genetic Testing    Negative genetic testing common hereditary cancer panel.  The Hereditary Gene Panel offered by [facility] includes sequencing and/or deletion duplication testing of the following 47 genes: APC, ATM, AXIN2, BARD1, BMPR1A, BRCA1, BRCA2, BRIP1, CDH1, CDK4, CDKN2A (p14ARF), CDKN2A (p16INK4a), CHEK2, CTNNA1, DICER1, EPCAM (Deletion/duplication testing only), GREM1 (promoter region deletion/duplication testing only), KIT, MEN1, MLH1, MSH2, MSH3, MSH6, MUTYH, NBN, NF1, NHTL1, PALB2, PDGFRA, PMS2, POLD1, POLE, PTEN, RAD50, RAD51C, RAD51D, SDHB, SDHC, SDHD, SMAD4, SMARCA4. STK11, TP53, TSC1, TSC2, and VHL.  The following genes were evaluated for sequence changes only: SDHA and HOXB13 c.251G>A variant only. The report date is August 07, 2017.      08/16/2017 Surgery    Left lumpectomy: IDC grade 2, 2.5 cm, DCIS intermediate grade, lymphovascular invasion identified, margins negative, 1/2 lymph nodes positive with extracapsular extension, ER 90%, PR 40%, HER-2 negative ratio 1.15, Ki-67 15% T2 N1a stage II a; Mammaprint high risk       09/12/2017 -  Chemotherapy    Adjuvant chemotherapy with dose dense Adriamycin and Cytoxan x4 followed by Taxol weekly x12        CHIEF COMPLIANT: Cycle 4 dose dense Adriamycin and Cytoxan  INTERVAL HISTORY: NISHA DHAMI is a 29-year with  above-mentioned history of breast cancer treated with lumpectomy is current adjuvant chemotherapy today cycle 4 of dose dense Adriamycin and Cytoxan.  She is tolerating the treatment overall fairly well.  She complains mainly about fatigue.  We discussed on the phone that the CT scans of the abdomen pelvis did not show any evidence of metastatic disease.  REVIEW OF SYSTEMS:   Constitutional: Denies fevers, chills or abnormal weight loss Eyes: Denies blurriness of vision Ears, nose, mouth, throat, and face: Denies mucositis or sore throat Respiratory: Denies cough, dyspnea or wheezes Cardiovascular: Denies palpitation, chest discomfort Gastrointestinal:  Denies nausea, heartburn or change in bowel habits Skin: Denies abnormal skin rashes Lymphatics: Denies new lymphadenopathy or easy bruising Neurological:Denies numbness, tingling or new weaknesses Behavioral/Psych: Mood is stable, no new changes  Extremities: No lower extremity edema Breast:  denies any pain or lumps or nodules in either breasts All other systems were reviewed with the patient and are negative.  I have reviewed the past medical history, past surgical history, social history and family history with the patient and they are unchanged from previous note.  ALLERGIES:  has No Known Allergies.  MEDICATIONS:  Current Outpatient Medications  Medication Sig Dispense Refill  . ALPRAZolam (XANAX) 0.25 MG tablet Take 0.25 mg by mouth daily as needed for anxiety.  2  . dexamethasone (DECADRON) 4 MG tablet Take 1 tablet day after chemo and 1 tablet 2 days after with food 10 tablet 0  . escitalopram (LEXAPRO) 20 MG tablet Take 20 mg by mouth daily with supper.     Marland Kitchen HYDROcodone-acetaminophen (NORCO/VICODIN) 5-325 MG tablet Take 1 tablet by mouth  every 6 (six) hours as needed for moderate pain or severe pain. 15 tablet 0  . ibuprofen (ADVIL,MOTRIN) 200 MG tablet Take 200-800 mg by mouth every 8 (eight) hours as needed (for pain.).     Marland Kitchen  lidocaine-prilocaine (EMLA) cream Apply to affected area once 30 g 3  . LORazepam (ATIVAN) 0.5 MG tablet Take 1 tablet (0.5 mg total) by mouth at bedtime as needed (Nausea or vomiting). (Patient not taking: Reported on 09/12/2017) 30 tablet 0  . ondansetron (ZOFRAN) 8 MG tablet Take 1 tablet (8 mg total) by mouth 2 (two) times daily as needed. Start on the third day after chemotherapy. (Patient not taking: Reported on 09/12/2017) 30 tablet 1  . oxyCODONE (ROXICODONE) 5 MG immediate release tablet Take 1 tablet (5 mg total) by mouth every 4 (four) hours as needed. (Patient not taking: Reported on 09/12/2017) 20 tablet 0  . prochlorperazine (COMPAZINE) 10 MG tablet Take 1 tablet (10 mg total) by mouth every 6 (six) hours as needed (Nausea or vomiting). (Patient not taking: Reported on 09/12/2017) 30 tablet 1  . valACYclovir (VALTREX) 500 MG tablet Take 1 tablet (500 mg total) by mouth 2 (two) times daily. 60 tablet 6   No current facility-administered medications for this visit.     PHYSICAL EXAMINATION: ECOG PERFORMANCE STATUS: 0 - Asymptomatic  Vitals:   10/24/17 1009  BP: 128/77  Pulse: 68  Resp: 20  Temp: 98.8 F (37.1 C)  SpO2: 100%   Filed Weights   10/24/17 1009  Weight: 218 lb 8 oz (99.1 kg)    GENERAL:alert, no distress and comfortable SKIN: skin color, texture, turgor are normal, no rashes or significant lesions EYES: normal, Conjunctiva are pink and non-injected, sclera clear OROPHARYNX:no exudate, no erythema and lips, buccal mucosa, and tongue normal  NECK: supple, thyroid normal size, non-tender, without nodularity LYMPH:  no palpable lymphadenopathy in the cervical, axillary or inguinal LUNGS: clear to auscultation and percussion with normal breathing effort HEART: regular rate & rhythm and no murmurs and no lower extremity edema ABDOMEN:abdomen soft, non-tender and normal bowel sounds MUSCULOSKELETAL:no cyanosis of digits and no clubbing  NEURO: alert & oriented x 3  with fluent speech, no focal motor/sensory deficits EXTREMITIES: No lower extremity edema  LABORATORY DATA:  I have reviewed the data as listed CMP Latest Ref Rng & Units 10/10/2017 09/26/2017 09/19/2017  Glucose 70 - 140 mg/dL 140 114 124  BUN 7 - 26 mg/dL _0 Creatinine 0.60 - 1.10 mg/dL 0.79 0.81 0.76  Sodium 136 - 145 mmol/L 139 140 140  Potassium 3.5 - 5.1 mmol/L 3.9 4.1 4.0  Chloride 98 - 109 mmol/L 107 107 108  CO2 22 - 29 mmol/L _1 Calcium 8.4 - 10.4 mg/dL 8.8 9.4 9.0  Total Protein 6.4 - 8.3 g/dL 6.6 7.1 6.4  Total Bilirubin 0.2 - 1.2 mg/dL 0.3 <0.2(L) 0.3  Alkaline Phos 40 - 150 U/L 87 100 94  AST 5 - 34 U/L _2 ALT 0 - 55 U/L 22 34 9    Lab Results  Component Value Date   WBC 10.6 (H) 10/24/2017   HGB 11.2 (L) 10/24/2017   HCT 31.7 (L) 10/24/2017   MCV 87.4 10/24/2017   PLT 167 10/24/2017   NEUTROABS 8.6 (H) 10/24/2017    ASSESSMENT & PLAN:  Malignant neoplasm of upper-inner quadrant of left breast in female, estrogen receptor positive ([facility]) 08/19/2017:Left lumpectomy: IDC grade 2, 2.5 cm, DCIS intermediate grade, lymphovascular  invasion identified, margins negative, 1/2 lymph nodes positive with extracapsular extension, ER 90%, PR 40%, HER-2 negative ratio 1.15, Ki-67 15% T2 N1a stage II a; Mammaprint high risk   Pathology counseling: I discussed the final pathology report of the patient provided a copy of this report. I discussed the margins as well as lymph node surgeries. We also discussed the final staging along with previously performed ER/PR and HER-2/neu testing.  Recommendation: 1. Adjuvant chemotherapy with dose dense Adriamycin and Cytoxan x4 followed by Taxol weekly x12, echocardiogram 09/04/17 was normal with EF of 55-60% 2. followed by adjuvant radiation therapy 3. Followed by adjuvant antiestrogen therapy  _______________________________________________________________________________________________  Current treatment: Cycle 4  dose dense Adriamycin and Cytoxan  Chemo toxicities: 1.  Fatigue  2. hair loss 3 abdominal discomfort: CT chest abdomen pelvis did not show any evidence of metastatic disease.   Return to clinic in 2 weeks for cycle 1 Taxol      No orders of the defined types were placed in this encounter.  The patient has a good understanding of the overall plan. she agrees with it. she will call with any problems that may develop before the next visit here.   Harriette Ohara, MD 10/24/17

## 2017-10-24 NOTE — Progress Notes (Signed)
   DATE: 10/24/2017      X CHEMO/IMMUNOTHERAPY REACTION          MD: Lindi Adie   AGENT/BLOOD PRODUCT RECEIVING TODAY:   Adriamycin and Cytoxan   AGENT/BLOOD PRODUCT RECEIVING IMMEDIATELY PRIOR TO REACTION: Cytoxan  REACTION(S): Burning and irritation of the sinuses and nasal passages.  PREMEDS: Aloxi, Dexamethasone, Emend, Pepcid   INTERVENTION:  Pepcid (20 mg)  x  1       Review of Systems  HENT:       Burning and irritation of the sinuses and nasal passages.   Physical Exam  Constitutional: She is oriented to person, place, and time. No distress.  HENT:  Head: Normocephalic and atraumatic.  Neurological: She is alert and oriented to person, place, and time.  Skin: She is not diaphoretic.  Psychiatric: She has a normal mood and affect. Her behavior is normal. Judgment and thought content normal.    OUTCOME: Patient responded to intervention. Chemo/Immunotherapy restarted and completed.   Sandi Mealy, MHS, PA-C

## 2017-10-24 NOTE — Progress Notes (Unsigned)
e

## 2017-10-25 ENCOUNTER — Inpatient Hospital Stay: Payer: 59

## 2017-10-25 ENCOUNTER — Telehealth: Payer: Self-pay | Admitting: *Deleted

## 2017-10-25 VITALS — BP 122/77 | HR 74 | Temp 99.0°F | Resp 18

## 2017-10-25 DIAGNOSIS — C50212 Malignant neoplasm of upper-inner quadrant of left female breast: Secondary | ICD-10-CM

## 2017-10-25 DIAGNOSIS — Z170 Estrogen receptor positive status [ER+]: Principal | ICD-10-CM

## 2017-10-25 DIAGNOSIS — Z5111 Encounter for antineoplastic chemotherapy: Secondary | ICD-10-CM | POA: Diagnosis not present

## 2017-10-25 MED ORDER — PEGFILGRASTIM-CBQV 6 MG/0.6ML ~~LOC~~ SOSY
6.0000 mg | PREFILLED_SYRINGE | Freq: Once | SUBCUTANEOUS | Status: AC
  Administered 2017-10-25: 6 mg via SUBCUTANEOUS

## 2017-10-25 MED ORDER — PEGFILGRASTIM-CBQV 6 MG/0.6ML ~~LOC~~ SOSY
PREFILLED_SYRINGE | SUBCUTANEOUS | Status: AC
  Filled 2017-10-25: qty 0.6

## 2017-10-25 NOTE — Telephone Encounter (Signed)
"  Have not received today's injection appointment yet.  Left yesterday at 12:30 pm.  Have to wait 24 hours."  Shared today's injection appointment is at 3:00 pm.  Denies further questions or needs at this time,.

## 2017-10-29 ENCOUNTER — Ambulatory Visit (HOSPITAL_COMMUNITY)
Admission: RE | Admit: 2017-10-29 | Discharge: 2017-10-29 | Disposition: A | Discharge status: Home / Self Care | Payer: 59 | Source: Ambulatory Visit | Attending: Hematology and Oncology | Admitting: Hematology and Oncology

## 2017-10-29 DIAGNOSIS — C50212 Malignant neoplasm of upper-inner quadrant of left female breast: Secondary | ICD-10-CM | POA: Diagnosis not present

## 2017-10-29 DIAGNOSIS — Z170 Estrogen receptor positive status [ER+]: Secondary | ICD-10-CM | POA: Diagnosis not present

## 2017-10-29 NOTE — Progress Notes (Signed)
  Echocardiogram 2D Echocardiogram has been performed.  Madelaine Etienne 10/29/2017, 10:51 AM

## 2017-11-07 ENCOUNTER — Encounter: Payer: Self-pay | Admitting: *Deleted

## 2017-11-08 ENCOUNTER — Encounter: Payer: Self-pay | Admitting: *Deleted

## 2017-11-08 ENCOUNTER — Inpatient Hospital Stay: Payer: 59

## 2017-11-08 ENCOUNTER — Inpatient Hospital Stay: Payer: 59 | Attending: Hematology and Oncology | Admitting: Adult Health

## 2017-11-08 ENCOUNTER — Encounter: Payer: Self-pay | Admitting: Adult Health

## 2017-11-08 VITALS — BP 123/64 | HR 70 | Temp 98.6°F | Resp 16

## 2017-11-08 DIAGNOSIS — Z803 Family history of malignant neoplasm of breast: Secondary | ICD-10-CM | POA: Diagnosis not present

## 2017-11-08 DIAGNOSIS — Z87891 Personal history of nicotine dependence: Secondary | ICD-10-CM | POA: Diagnosis not present

## 2017-11-08 DIAGNOSIS — C50212 Malignant neoplasm of upper-inner quadrant of left female breast: Secondary | ICD-10-CM

## 2017-11-08 DIAGNOSIS — Z170 Estrogen receptor positive status [ER+]: Principal | ICD-10-CM

## 2017-11-08 DIAGNOSIS — F419 Anxiety disorder, unspecified: Secondary | ICD-10-CM | POA: Insufficient documentation

## 2017-11-08 DIAGNOSIS — Z5111 Encounter for antineoplastic chemotherapy: Secondary | ICD-10-CM

## 2017-11-08 DIAGNOSIS — Z800 Family history of malignant neoplasm of digestive organs: Secondary | ICD-10-CM | POA: Diagnosis not present

## 2017-11-08 LAB — CBC WITH DIFFERENTIAL (CANCER CENTER ONLY)
BASOS PCT: 1 %
Basophils Absolute: 0.1 10*3/uL (ref 0.0–0.1)
EOS ABS: 0.1 10*3/uL (ref 0.0–0.5)
EOS PCT: 1 %
HCT: 32.6 % — ABNORMAL LOW (ref 34.8–46.6)
HEMOGLOBIN: 11.2 g/dL — AB (ref 11.6–15.9)
Lymphocytes Relative: 9 %
Lymphs Abs: 1.3 10*3/uL (ref 0.9–3.3)
MCH: 30.5 pg (ref 25.1–34.0)
MCHC: 34.4 g/dL (ref 31.5–36.0)
MCV: 88.6 fL (ref 79.5–101.0)
MONOS PCT: 5 %
Monocytes Absolute: 0.7 10*3/uL (ref 0.1–0.9)
NEUTROS PCT: 84 %
Neutro Abs: 11.6 10*3/uL — ABNORMAL HIGH (ref 1.5–6.5)
PLATELETS: 160 10*3/uL (ref 145–400)
RBC: 3.67 MIL/uL — ABNORMAL LOW (ref 3.70–5.45)
RDW: 16.7 % — AB (ref 11.2–14.5)
WBC Count: 13.8 10*3/uL — ABNORMAL HIGH (ref 3.9–10.3)

## 2017-11-08 LAB — CMP (CANCER CENTER ONLY)
ALK PHOS: 100 U/L (ref 40–150)
ALT: 18 U/L (ref 0–55)
AST: 18 U/L (ref 5–34)
Albumin: 4.1 g/dL (ref 3.5–5.0)
Anion gap: 7 (ref 3–11)
BUN: 11 mg/dL (ref 7–26)
CALCIUM: 9.2 mg/dL (ref 8.4–10.4)
CO2: 26 mmol/L (ref 22–29)
CREATININE: 0.72 mg/dL (ref 0.60–1.10)
Chloride: 106 mmol/L (ref 98–109)
Glucose, Bld: 88 mg/dL (ref 70–140)
Potassium: 4.4 mmol/L (ref 3.5–5.1)
SODIUM: 139 mmol/L (ref 136–145)
Total Bilirubin: 0.3 mg/dL (ref 0.2–1.2)
Total Protein: 6.7 g/dL (ref 6.4–8.3)

## 2017-11-08 MED ORDER — FAMOTIDINE IN NACL 20-0.9 MG/50ML-% IV SOLN
INTRAVENOUS | Status: AC
  Filled 2017-11-08: qty 50

## 2017-11-08 MED ORDER — FAMOTIDINE IN NACL 20-0.9 MG/50ML-% IV SOLN
20.0000 mg | Freq: Once | INTRAVENOUS | Status: AC
  Administered 2017-11-08: 20 mg via INTRAVENOUS

## 2017-11-08 MED ORDER — DIPHENHYDRAMINE HCL 50 MG/ML IJ SOLN
50.0000 mg | Freq: Once | INTRAMUSCULAR | Status: AC
  Administered 2017-11-08: 50 mg via INTRAVENOUS

## 2017-11-08 MED ORDER — DIPHENHYDRAMINE HCL 50 MG/ML IJ SOLN
INTRAMUSCULAR | Status: AC
  Filled 2017-11-08: qty 1

## 2017-11-08 MED ORDER — LORAZEPAM 0.5 MG PO TABS: 0.5000 mg | ORAL_TABLET | Freq: Every evening | ORAL | 0 refills | Status: DC | PRN

## 2017-11-08 MED ORDER — SODIUM CHLORIDE 0.9 % IV SOLN
Freq: Once | INTRAVENOUS | Status: AC
  Administered 2017-11-08: 13:00:00 via INTRAVENOUS

## 2017-11-08 MED ORDER — DEXAMETHASONE SODIUM PHOSPHATE 100 MG/10ML IJ SOLN
20.0000 mg | Freq: Once | INTRAMUSCULAR | Status: AC
  Administered 2017-11-08: 20 mg via INTRAVENOUS
  Filled 2017-11-08: qty 2

## 2017-11-08 MED ORDER — HEPARIN SOD (PORK) LOCK FLUSH 100 UNIT/ML IV SOLN
500.0000 [IU] | Freq: Once | INTRAVENOUS | Status: AC | PRN
  Administered 2017-11-08: 500 [IU]
  Filled 2017-11-08: qty 5

## 2017-11-08 MED ORDER — SODIUM CHLORIDE 0.9% FLUSH
10.0000 mL | INTRAVENOUS | Status: DC | PRN
Start: 2017-11-08 — End: 2017-11-08
  Administered 2017-11-08: 10 mL
  Filled 2017-11-08: qty 10

## 2017-11-08 MED ORDER — SODIUM CHLORIDE 0.9 % IV SOLN
80.0000 mg/m2 | Freq: Once | INTRAVENOUS | Status: AC
  Administered 2017-11-08: 168 mg via INTRAVENOUS
  Filled 2017-11-08: qty 28

## 2017-11-08 MED ORDER — PROCHLORPERAZINE MALEATE 10 MG PO TABS: 10.0000 mg | ORAL_TABLET | Freq: Four times a day (QID) | ORAL | 1 refills | Status: DC | PRN

## 2017-11-08 NOTE — Assessment & Plan Note (Signed)
08/19/2017:Left lumpectomy: IDC grade 2, 2.5 cm, DCIS intermediate grade, lymphovascular invasion identified, margins negative, 1/2 lymph nodes positive with extracapsular extension, ER 90%, PR 40%, HER-2 negative ratio 1.15, Ki-67 15% T2 N1a stage II a; Mammaprint high risk   Pathology counseling: I discussed the final pathology report of the patient provided a copy of this report. I discussed the margins as well as lymph node surgeries. We also discussed the final staging along with previously performed ER/PR and HER-2/neu testing.  Recommendation: 1. Adjuvant chemotherapy with dose dense Adriamycin and Cytoxan x4 followed by Taxol weekly x12, echocardiogram 09/04/17 was normal with EF of 55-60%, echo 10/29/17 normal with EF 60-65% 2. followed by adjuvant radiation therapy 3. Followed by adjuvant antiestrogen therapy  _______________________________________________________________________________________________  Current treatment: Cycle 1 weekly Taxol  Reniya is doing well today.  Her CBC is stable and she will proceed with her first weekly Taxol today (so long as CMET is within parameters).  We reviewed the Taxol chemotherapy regimen in general.  We reviewed common adverse effects.  I refilled her Compazine and Lorazepam today.  I encouraged her to take one of those tonight and then take her anti emetics as needed.   We reviewed the bloating.  It could be a side effect of the chemo, or it could be related to constipation that she has a known history of.  We reviewed her CT scan which didn't discuss any stool burden in her colon.  We decided that she could take an extra dose of Miralax, and we may do a plain film of the abdomen next week if needed.    Kaylynne will return in 1 week for labs, f/u and her second weekly Taxol.

## 2017-11-08 NOTE — Progress Notes (Signed)
[facility] Follow up:    Sabrina Jordan, MD [place] [address] [address]   DIAGNOSIS: Cancer Staging Malignant neoplasm of upper-inner quadrant of left breast in female, estrogen receptor positive ([place]) Staging form: Breast, AJCC 8th Edition - Clinical: Stage IA (cT1c, cN0, cM0, G2, ER: Positive, PR: Positive, HER2: Negative) - Unsigned   SUMMARY OF ONCOLOGIC HISTORY:   Malignant neoplasm of upper-inner quadrant of left breast in female, estrogen receptor positive ([place])   07/24/2017 Initial Diagnosis    Left breast palpable lump upper inner quadrant 11 o'clock position: Ill-defined 1.6 cm mass, axilla ultrasound negative, ultrasound-guided biopsy: Grade 2 IDC ER 90%, PR 40%, Ki-67 15%, HER-2 negative      08/09/2017 Genetic Testing    Negative genetic testing common hereditary cancer panel.  The Hereditary Gene Panel offered by [facility] includes sequencing and/or deletion duplication testing of the following 47 genes: APC, ATM, AXIN2, BARD1, BMPR1A, BRCA1, BRCA2, BRIP1, CDH1, CDK4, CDKN2A (p14ARF), CDKN2A (p16INK4a), CHEK2, CTNNA1, DICER1, EPCAM (Deletion/duplication testing only), GREM1 (promoter region deletion/duplication testing only), KIT, MEN1, MLH1, MSH2, MSH3, MSH6, MUTYH, NBN, NF1, NHTL1, PALB2, PDGFRA, PMS2, POLD1, POLE, PTEN, RAD50, RAD51C, RAD51D, SDHB, SDHC, SDHD, SMAD4, SMARCA4. STK11, TP53, TSC1, TSC2, and VHL.  The following genes were evaluated for sequence changes only: SDHA and HOXB13 c.251G>A variant only. The report date is August 07, 2017.      08/16/2017 Surgery    Left lumpectomy: IDC grade 2, 2.5 cm, DCIS intermediate grade, lymphovascular invasion identified, margins negative, 1/2 lymph nodes positive with extracapsular extension, ER 90%, PR 40%, HER-2 negative ratio 1.15, Ki-67 15% T2 N1a stage II a; Mammaprint high risk       09/12/2017 -  Chemotherapy    Adjuvant chemotherapy with dose dense Adriamycin and Cytoxan x4  followed by Taxol weekly x12        CURRENT THERAPY: weekly Taxol  INTERVAL HISTORY: [name] 37 y.o. female returns for evaluation of her estrogen positive breast cancer prior to receiving her first weekly Taxol.  She is doing well today.  She is nervous about getting the Taxol and is requesting guidance on how to take her anti emetics.  Naika has noted some abdominal bloating.  She has had issues with constipation, and takes Miralax for this.     Patient Active Problem List   Diagnosis Date Noted  . Port-A-Cath in place 09/12/2017  . Genetic testing 08/09/2017  . Family history of breast cancer   . Family history of colon cancer   . Family history of cancer   . Malignant neoplasm of upper-inner quadrant of left breast in female, estrogen receptor positive ([facility]) 07/30/2017  . Previous cesarean section 09/05/2015  . Postpartum care following cesarean delivery with btl (4/8) 09/04/2015  . Symptomatic cholelithiasis 11/20/2011    has No Known Allergies.  MEDICAL HISTORY: Past Medical History:  Diagnosis Date  . Anxiety   . Breast cancer, left ([facility]) 07/2017  . Family history of breast cancer   . Family history of colon cancer     SURGICAL HISTORY: Past Surgical History:  Procedure Laterality Date  . BREAST LUMPECTOMY WITH RADIOACTIVE SEED AND SENTINEL LYMPH NODE BIOPSY Left 08/16/2017   Procedure: BREAST LUMPECTOMY WITH RADIOACTIVE SEED AND SENTINEL LYMPH NODE BIOPSY;  Surgeon: Excell Seltzer, MD;  Location: [facility];  Service: General;  Laterality: Left;  . BREAST LUMPECTOMY WITH SENTINEL LYMPH NODE BIOPSY Left 08/16/2017  . BREAST REDUCTION SURGERY Bilateral  08/23/2017   Procedure: LEFT ONCOPLASTIC REDUCTION, RIGHT BREAST REDUCTION;  Surgeon: Irene Limbo, MD;  Location: [facility];  Service: Plastics;  Laterality: Bilateral;  . CESAREAN SECTION  03/15/2012   Procedure: CESAREAN SECTION;  Surgeon: Lovenia Kim, MD;   Location: [facility];  Service: Obstetrics;  Laterality: N/A;  Primary cesarean section with delivery of baby girl at 58. Apgars 4/7.  Marland Kitchen CESAREAN SECTION WITH BILATERAL TUBAL LIGATION Bilateral 09/03/2015   Procedure: REPEAT CESAREAN SECTION WITH BILATERAL TUBAL LIGATION;  Surgeon: Brien Few, MD;  Location: [facility];  Service: Obstetrics;  Laterality: Bilateral;  EDD: 09/10/15  . CHOLECYSTECTOMY  12/07/2011   Procedure: LAPAROSCOPIC CHOLECYSTECTOMY;  Surgeon: Harl Bowie, MD;  Location: [facility];  Service: General;  Laterality: N/A;  . PORTACATH PLACEMENT Right 09/11/2017   Procedure: INSERTION PORT-A-CATH;  Surgeon: Excell Seltzer, MD;  Location: [facility];  Service: General;  Laterality: Right;  . WISDOM TOOTH EXTRACTION  2002    SOCIAL HISTORY: Social History   Socioeconomic History  . Marital status: Married    Spouse name: Not on file  . Number of children: Not on file  . Years of education: Not on file  . Highest education level: Not on file  Occupational History  . Not on file  Social Needs  . Financial resource strain: Not on file  . Food insecurity:    Worry: Not on file    Inability: Not on file  . Transportation needs:    Medical: Not on file    Non-medical: Not on file  Tobacco Use  . Smoking status: Former Smoker    Packs/day: 0.00    Last attempt to quit: 05/27/2014    Years since quitting: 3.4  . Smokeless tobacco: Never Used  Substance and Sexual Activity  . Alcohol use: Yes    Comment: rarely  . Drug use: Yes    Types: Marijuana    Comment: reports she occsionally uses THC when vaping   . Sexual activity: Yes  Lifestyle  . Physical activity:    Days per week: Not on file    Minutes per session: Not on file  . Stress: Not on file  Relationships  . Social connections:    Talks on phone: Not on file    Gets together: Not on file    Attends religious service: Not on file    Active member of club or organization: Not on file    Attends meetings of  clubs or organizations: Not on file    Relationship status: Not on file  . Intimate partner violence:    Fear of current or ex partner: Not on file    Emotionally abused: Not on file    Physically abused: Not on file    Forced sexual activity: Not on file  Other Topics Concern  . Not on file  Social History Narrative  . Not on file    FAMILY HISTORY: Family History  Problem Relation Age of Onset  . Diabetes Mother   . Cancer Mother 68       chondrosarcoma  . Breast cancer Mother 48       DCIS vs Atypical Hyperplasia  . Breast cancer Other        Maternal Great Aunt; MGMs sister  . Breast cancer Other        Maternal Great Aunt  . Other Maternal Grandmother        d. from complications of a fall and breaking her hip  .  Alcohol abuse Maternal Grandfather   . Colon cancer Paternal Grandmother     Review of Systems  Constitutional: Positive for fatigue. Negative for appetite change, chills, fever and unexpected weight change.  HENT:   Negative for hearing loss, lump/mass and trouble swallowing.   Eyes: Negative for eye problems and icterus.  Respiratory: Negative for chest tightness, cough and shortness of breath.   Cardiovascular: Positive for palpitations (noted on day of chemo with adriamycin/cytoxan, has resolved). Negative for chest pain and leg swelling.  Gastrointestinal: Negative for abdominal distention, abdominal pain, constipation, diarrhea, nausea and vomiting.  Endocrine: Negative for hot flashes.  Skin: Negative for itching and rash.  Neurological: Negative for dizziness, extremity weakness, headaches and numbness.  Hematological: Negative for adenopathy. Does not bruise/bleed easily.  Psychiatric/Behavioral: Negative for depression. The patient is not nervous/anxious.       PHYSICAL EXAMINATION  ECOG PERFORMANCE STATUS: 1 - Symptomatic but completely ambulatory  Vitals:   11/08/17 1134  BP: 128/83  Pulse: 99  Resp: 18  Temp: 97.9 F (36.6 C)  SpO2:  99%    Physical Exam  Constitutional: She appears well-developed and well-nourished.  HENT:  Head: Normocephalic and atraumatic.  Mouth/Throat: Oropharynx is clear and moist. No oropharyngeal exudate.  Eyes: Pupils are equal, round, and reactive to light. No scleral icterus.  Neck: Neck supple.  Cardiovascular: Normal rate, regular rhythm and normal heart sounds.  Pulmonary/Chest: Effort normal and breath sounds normal.  Abdominal: Soft. Bowel sounds are normal.  Musculoskeletal: She exhibits no edema.  Lymphadenopathy:    She has no cervical adenopathy.  Skin: Skin is dry. No rash noted.  Psychiatric: She has a normal mood and affect.    LABORATORY DATA:  CBC    Component Value Date/Time   WBC 13.8 (H) 11/08/2017 1122   WBC 7.2 09/09/2017 0953   RBC 3.67 (L) 11/08/2017 1122   HGB 11.2 (L) 11/08/2017 1122   HCT 32.6 (L) 11/08/2017 1122   PLT 160 11/08/2017 1122   MCV 88.6 11/08/2017 1122   MCH 30.5 11/08/2017 1122   MCHC 34.4 11/08/2017 1122   RDW 16.7 (H) 11/08/2017 1122   LYMPHSABS 1.3 11/08/2017 1122   MONOABS 0.7 11/08/2017 1122   EOSABS 0.1 11/08/2017 1122   BASOSABS 0.1 11/08/2017 1122    CMP     Component Value Date/Time   NA 139 11/08/2017 1122   K 4.4 11/08/2017 1122   CL 106 11/08/2017 1122   CO2 26 11/08/2017 1122   GLUCOSE 88 11/08/2017 1122   BUN 11 11/08/2017 1122   CREATININE 0.72 11/08/2017 1122   CALCIUM 9.2 11/08/2017 1122   PROT 6.7 11/08/2017 1122   ALBUMIN 4.1 11/08/2017 1122   AST 18 11/08/2017 1122   ALT 18 11/08/2017 1122   ALKPHOS 100 11/08/2017 1122   BILITOT 0.3 11/08/2017 1122   GFRNONAA >60 11/08/2017 1122   GFRAA >60 11/08/2017 1122        ASSESSMENT and PLAN:   Malignant neoplasm of upper-inner quadrant of left breast in female, estrogen receptor positive ([facility]) 08/19/2017:Left lumpectomy: IDC grade 2, 2.5 cm, DCIS intermediate grade, lymphovascular invasion identified, margins negative, 1/2 lymph nodes positive with  extracapsular extension, ER 90%, PR 40%, HER-2 negative ratio 1.15, Ki-67 15% T2 N1a stage II a; Mammaprint high risk   Pathology counseling: I discussed the final pathology report of the patient provided a copy of this report. I discussed the margins as well as lymph node surgeries. We also discussed the final staging  along with previously performed ER/PR and HER-2/neu testing.  Recommendation: 1. Adjuvant chemotherapy with dose dense Adriamycin and Cytoxan x4 followed by Taxol weekly x12, echocardiogram 09/04/17 was normal with EF of 55-60%, echo 10/29/17 normal with EF 60-65% 2. followed by adjuvant radiation therapy 3. Followed by adjuvant antiestrogen therapy  _______________________________________________________________________________________________  Current treatment: Cycle 1 weekly Taxol  Gabbie is doing well today.  Her CBC is stable and she will proceed with her first weekly Taxol today (so long as CMET is within parameters).  We reviewed the Taxol chemotherapy regimen in general.  We reviewed common adverse effects.  I refilled her Compazine and Lorazepam today.  I encouraged her to take one of those tonight and then take her anti emetics as needed.   We reviewed the bloating.  It could be a side effect of the chemo, or it could be related to constipation that she has a known history of.  We reviewed her CT scan which didn't discuss any stool burden in her colon.  We decided that she could take an extra dose of Miralax, and we may do a plain film of the abdomen next week if needed.    Adria will return in 1 week for labs, f/u and her second weekly Taxol.        All questions were answered. The patient knows to call the clinic with any problems, questions or concerns. We can certainly see the patient much sooner if necessary.  A total of (30) minutes of face-to-face time was spent with this patient with greater than 50% of that time in counseling and  care-coordination.  This note was electronically signed. Scot Dock, NP 11/08/2017

## 2017-11-08 NOTE — Patient Instructions (Signed)
[facility] Discharge Instructions for Patients Receiving Chemotherapy  Today you received the following chemotherapy agents paclitaxel (Taxol)  To help prevent nausea and vomiting after your treatment, we encourage you to take your nausea medication as directed by your doctor.  If you develop nausea and vomiting that is not controlled by your nausea medication, call the clinic.   BELOW ARE SYMPTOMS THAT SHOULD BE REPORTED IMMEDIATELY:  *FEVER GREATER THAN 100.5 F  *CHILLS WITH OR WITHOUT FEVER  NAUSEA AND VOMITING THAT IS NOT CONTROLLED WITH YOUR NAUSEA MEDICATION  *UNUSUAL SHORTNESS OF BREATH  *UNUSUAL BRUISING OR BLEEDING  TENDERNESS IN MOUTH AND THROAT WITH OR WITHOUT PRESENCE OF ULCERS  *URINARY PROBLEMS  *BOWEL PROBLEMS  UNUSUAL RASH Items with * indicate a potential emergency and should be followed up as soon as possible.  Feel free to call the clinic should you have any questions or concerns. The clinic phone number is [phone].  Please show the [facility] at check-in to the Emergency Department and triage nurse.  Paclitaxel injection What is this medicine? PACLITAXEL (PAK li TAX el) is a chemotherapy drug. It targets fast dividing cells, like cancer cells, and causes these cells to die. This medicine is used to treat ovarian cancer, breast cancer, and other cancers. This medicine may be used for other purposes; ask your health care provider or pharmacist if you have questions. COMMON BRAND NAME(S): Onxol, Taxol What should I tell my health care provider before I take this medicine? They need to know if you have any of these conditions: -blood disorders -irregular heartbeat -infection (especially a virus infection such as chickenpox, cold sores, or herpes) -liver disease -previous or ongoing radiation therapy -an unusual or allergic reaction to paclitaxel, alcohol, polyoxyethylated castor oil, other chemotherapy agents, other medicines,  foods, dyes, or preservatives -pregnant or trying to get pregnant -breast-feeding How should I use this medicine? This drug is given as an infusion into a vein. It is administered in a hospital or clinic by a specially trained health care professional. Talk to your pediatrician regarding the use of this medicine in children. Special care may be needed. Overdosage: If you think you have taken too much of this medicine contact a poison control center or emergency room at once. NOTE: This medicine is only for you. Do not share this medicine with others. What if I miss a dose? It is important not to miss your dose. Call your doctor or health care professional if you are unable to keep an appointment. What may interact with this medicine? Do not take this medicine with any of the following medications: -disulfiram -metronidazole This medicine may also interact with the following medications: -cyclosporine -diazepam -ketoconazole -medicines to increase blood counts like filgrastim, pegfilgrastim, sargramostim -other chemotherapy drugs like cisplatin, doxorubicin, epirubicin, etoposide, teniposide, vincristine -quinidine -testosterone -vaccines -verapamil Talk to your doctor or health care professional before taking any of these medicines: -acetaminophen -aspirin -ibuprofen -ketoprofen -naproxen This list may not describe all possible interactions. Give your health care provider a list of all the medicines, herbs, non-prescription drugs, or dietary supplements you use. Also tell them if you smoke, drink alcohol, or use illegal drugs. Some items may interact with your medicine. What should I watch for while using this medicine? Your condition will be monitored carefully while you are receiving this medicine. You will need important blood work done while you are taking this medicine. This medicine can cause serious allergic reactions. To reduce your risk you will  need to take other  medicine(s) before treatment with this medicine. If you experience allergic reactions like skin rash, itching or hives, swelling of the face, lips, or tongue, tell your doctor or health care professional right away. In some cases, you may be given additional medicines to help with side effects. Follow all directions for their use. This drug may make you feel generally unwell. This is not uncommon, as chemotherapy can affect healthy cells as well as cancer cells. Report any side effects. Continue your course of treatment even though you feel ill unless your doctor tells you to stop. Call your doctor or health care professional for advice if you get a fever, chills or sore throat, or other symptoms of a cold or flu. Do not treat yourself. This drug decreases your body's ability to fight infections. Try to avoid being around people who are sick. This medicine may increase your risk to bruise or bleed. Call your doctor or health care professional if you notice any unusual bleeding. Be careful brushing and flossing your teeth or using a toothpick because you may get an infection or bleed more easily. If you have any dental work done, tell your dentist you are receiving this medicine. Avoid taking products that contain aspirin, acetaminophen, ibuprofen, naproxen, or ketoprofen unless instructed by your doctor. These medicines may hide a fever. Do not become pregnant while taking this medicine. Women should inform their doctor if they wish to become pregnant or think they might be pregnant. There is a potential for serious side effects to an unborn child. Talk to your health care professional or pharmacist for more information. Do not breast-feed an infant while taking this medicine. Men are advised not to father a child while receiving this medicine. This product may contain alcohol. Ask your pharmacist or healthcare provider if this medicine contains alcohol. Be sure to tell all healthcare providers you are  taking this medicine. Certain medicines, like metronidazole and disulfiram, can cause an unpleasant reaction when taken with alcohol. The reaction includes flushing, headache, nausea, vomiting, sweating, and increased thirst. The reaction can last from 30 minutes to several hours. What side effects may I notice from receiving this medicine? Side effects that you should report to your doctor or health care professional as soon as possible: -allergic reactions like skin rash, itching or hives, swelling of the face, lips, or tongue -low blood counts - This drug may decrease the number of white blood cells, red blood cells and platelets. You may be at increased risk for infections and bleeding. -signs of infection - fever or chills, cough, sore throat, pain or difficulty passing urine -signs of decreased platelets or bleeding - bruising, pinpoint red spots on the skin, black, tarry stools, nosebleeds -signs of decreased red blood cells - unusually weak or tired, fainting spells, lightheadedness -breathing problems -chest pain -high or low blood pressure -mouth sores -nausea and vomiting -pain, swelling, redness or irritation at the injection site -pain, tingling, numbness in the hands or feet -slow or irregular heartbeat -swelling of the ankle, feet, hands Side effects that usually do not require medical attention (report to your doctor or health care professional if they continue or are bothersome): -bone pain -complete hair loss including hair on your head, underarms, pubic hair, eyebrows, and eyelashes -changes in the color of fingernails -diarrhea -loosening of the fingernails -loss of appetite -muscle or joint pain -red flush to skin -sweating This list may not describe all possible side effects. Call your doctor for medical  advice about side effects. You may report side effects to FDA at 1-800-FDA-1088. Where should I keep my medicine? This drug is given in a hospital or clinic and  will not be stored at home. NOTE: This sheet is a summary. It may not cover all possible information. If you have questions about this medicine, talk to your doctor, pharmacist, or health care provider.  2018 Elsevier/Gold Standard (2015-03-15 19:58:00)

## 2017-11-11 ENCOUNTER — Telehealth: Payer: Self-pay | Admitting: Adult Health

## 2017-11-11 NOTE — Telephone Encounter (Signed)
No 6/14 los °

## 2017-11-15 ENCOUNTER — Inpatient Hospital Stay: Payer: 59

## 2017-11-15 ENCOUNTER — Inpatient Hospital Stay (HOSPITAL_BASED_OUTPATIENT_CLINIC_OR_DEPARTMENT_OTHER): Payer: 59 | Admitting: Adult Health

## 2017-11-15 ENCOUNTER — Telehealth: Payer: Self-pay | Admitting: Adult Health

## 2017-11-15 ENCOUNTER — Encounter: Payer: Self-pay | Admitting: Adult Health

## 2017-11-15 VITALS — BP 110/45 | HR 84 | Temp 98.9°F | Resp 18 | Ht 66.0 in | Wt 222.4 lb

## 2017-11-15 DIAGNOSIS — C50212 Malignant neoplasm of upper-inner quadrant of left female breast: Secondary | ICD-10-CM

## 2017-11-15 DIAGNOSIS — Z5111 Encounter for antineoplastic chemotherapy: Secondary | ICD-10-CM | POA: Diagnosis not present

## 2017-11-15 DIAGNOSIS — Z170 Estrogen receptor positive status [ER+]: Principal | ICD-10-CM

## 2017-11-15 DIAGNOSIS — Z87891 Personal history of nicotine dependence: Secondary | ICD-10-CM

## 2017-11-15 DIAGNOSIS — F419 Anxiety disorder, unspecified: Secondary | ICD-10-CM

## 2017-11-15 DIAGNOSIS — Z800 Family history of malignant neoplasm of digestive organs: Secondary | ICD-10-CM

## 2017-11-15 DIAGNOSIS — Z95828 Presence of other vascular implants and grafts: Secondary | ICD-10-CM

## 2017-11-15 DIAGNOSIS — Z803 Family history of malignant neoplasm of breast: Secondary | ICD-10-CM

## 2017-11-15 LAB — CBC WITH DIFFERENTIAL (CANCER CENTER ONLY)
BASOS PCT: 2 %
Basophils Absolute: 0.1 10*3/uL (ref 0.0–0.1)
EOS ABS: 0.1 10*3/uL (ref 0.0–0.5)
EOS PCT: 1 %
HEMATOCRIT: 31.6 % — AB (ref 34.8–46.6)
Hemoglobin: 11 g/dL — ABNORMAL LOW (ref 11.6–15.9)
Lymphocytes Relative: 13 %
Lymphs Abs: 0.8 10*3/uL — ABNORMAL LOW (ref 0.9–3.3)
MCH: 31.1 pg (ref 25.1–34.0)
MCHC: 34.9 g/dL (ref 31.5–36.0)
MCV: 89.1 fL (ref 79.5–101.0)
MONO ABS: 0.5 10*3/uL (ref 0.1–0.9)
MONOS PCT: 8 %
Neutro Abs: 4.6 10*3/uL (ref 1.5–6.5)
Neutrophils Relative %: 76 %
Platelet Count: 284 10*3/uL (ref 145–400)
RBC: 3.55 MIL/uL — ABNORMAL LOW (ref 3.70–5.45)
RDW: 17 % — AB (ref 11.2–14.5)
WBC Count: 6.1 10*3/uL (ref 3.9–10.3)

## 2017-11-15 LAB — CMP (CANCER CENTER ONLY)
ALBUMIN: 3.9 g/dL (ref 3.5–5.0)
ALK PHOS: 88 U/L (ref 40–150)
ALT: 40 U/L (ref 0–55)
AST: 27 U/L (ref 5–34)
Anion gap: 8 (ref 3–11)
BUN: 12 mg/dL (ref 7–26)
CALCIUM: 9.1 mg/dL (ref 8.4–10.4)
CO2: 25 mmol/L (ref 22–29)
CREATININE: 0.77 mg/dL (ref 0.60–1.10)
Chloride: 106 mmol/L (ref 98–109)
GFR, Est AFR Am: >60 mL/min (ref >60)
GFR, Estimated: >60 mL/min (ref >60)
GLUCOSE: 96 mg/dL (ref 70–140)
Potassium: 4.8 mmol/L (ref 3.5–5.1)
SODIUM: 139 mmol/L (ref 136–145)
Total Bilirubin: 0.4 mg/dL (ref 0.2–1.2)
Total Protein: 6.6 g/dL (ref 6.4–8.3)

## 2017-11-15 MED ORDER — HEPARIN SOD (PORK) LOCK FLUSH 100 UNIT/ML IV SOLN
500.0000 [IU] | Freq: Once | INTRAVENOUS | Status: AC | PRN
  Administered 2017-11-15: 500 [IU]
  Filled 2017-11-15: qty 5

## 2017-11-15 MED ORDER — SODIUM CHLORIDE 0.9% FLUSH
10.0000 mL | INTRAVENOUS | Status: DC | PRN
  Filled 2017-11-15: qty 10

## 2017-11-15 MED ORDER — SODIUM CHLORIDE 0.9 % IV SOLN
Freq: Once | INTRAVENOUS | Status: AC
  Administered 2017-11-15: 12:00:00 via INTRAVENOUS

## 2017-11-15 MED ORDER — DIPHENHYDRAMINE HCL 50 MG/ML IJ SOLN
INTRAMUSCULAR | Status: AC
  Filled 2017-11-15: qty 1

## 2017-11-15 MED ORDER — FAMOTIDINE IN NACL 20-0.9 MG/50ML-% IV SOLN
20.0000 mg | Freq: Once | INTRAVENOUS | Status: AC
  Administered 2017-11-15: 20 mg via INTRAVENOUS

## 2017-11-15 MED ORDER — SODIUM CHLORIDE 0.9% FLUSH
10.0000 mL | INTRAVENOUS | Status: AC | PRN
  Administered 2017-11-15 (×2): 10 mL
  Filled 2017-11-15: qty 10

## 2017-11-15 MED ORDER — FAMOTIDINE IN NACL 20-0.9 MG/50ML-% IV SOLN
INTRAVENOUS | Status: AC
Start: 2017-11-15 — End: ?
  Filled 2017-11-15: qty 50

## 2017-11-15 MED ORDER — SODIUM CHLORIDE 0.9 % IV SOLN
20.0000 mg | Freq: Once | INTRAVENOUS | Status: AC
  Administered 2017-11-15: 20 mg via INTRAVENOUS
  Filled 2017-11-15: qty 2

## 2017-11-15 MED ORDER — DIPHENHYDRAMINE HCL 50 MG/ML IJ SOLN
25.0000 mg | Freq: Once | INTRAMUSCULAR | Status: AC
  Administered 2017-11-15: 25 mg via INTRAVENOUS

## 2017-11-15 MED ORDER — SODIUM CHLORIDE 0.9 % IV SOLN
80.0000 mg/m2 | Freq: Once | INTRAVENOUS | Status: AC
  Administered 2017-11-15: 168 mg via INTRAVENOUS
  Filled 2017-11-15: qty 28

## 2017-11-15 NOTE — Progress Notes (Signed)
Around 13:20 Pt c/o of SOB and a little chest tightness after the infusion started for about 5 minutes, infusion was paused, vital signs stable, and pt stated " I feel okay now " Dr. Irene Limbo made aware and order to  resume the infusion. Infusion resumed at 13:40.   1410 pt able to walk to the bathroom with no problem.

## 2017-11-15 NOTE — Assessment & Plan Note (Addendum)
08/19/2017:Left lumpectomy: IDC grade 2, 2.5 cm, DCIS intermediate grade, lymphovascular invasion identified, margins negative, 1/2 lymph nodes positive with extracapsular extension, ER 90%, PR 40%, HER-2 negative ratio 1.15, Ki-67 15% T2 N1a stage II a; Mammaprint high risk   Pathology counseling: I discussed the final pathology report of the patient provided a copy of this report. I discussed the margins as well as lymph node surgeries. We also discussed the final staging along with previously performed ER/PR and HER-2/neu testing.  Recommendation: 1. Adjuvant chemotherapy with dose dense Adriamycin and Cytoxan x4 followed by Taxol weekly x12, echocardiogram 09/04/17 was normal with EF of 55-60%, echo 10/29/17 normal with EF 60-65% 2. followed by adjuvant radiation therapy 3. Followed by adjuvant antiestrogen therapy  _______________________________________________________________________________________________  Current treatment: Cycle 2 weekly Taxol  [name] is doing well today. She tolerated Taxol well.  I did decrease her Benadryl today since the '50mg'$  made her sleepy last time.  She will proceed with Taxol today (labs pending, so long as CMET is within parameters).  Her ears have fluid noted in the TM.  I recommended flonase daily for her to take.    [name] will return weekly for labs and Taxol, and every other week will see myself or Dr. Lindi Adie.

## 2017-11-15 NOTE — Progress Notes (Signed)
[facility] Cancer Follow up:    Sabrina Jordan, MD [address] [address]   DIAGNOSIS: Cancer Staging Malignant neoplasm of upper-inner quadrant of left breast in female, estrogen receptor positive ([place]) Staging form: Breast, AJCC 8th Edition - Clinical: Stage IA (cT1c, cN0, cM0, G2, ER: Positive, PR: Positive, HER2: Negative) - Unsigned   SUMMARY OF ONCOLOGIC HISTORY:   Malignant neoplasm of upper-inner quadrant of left breast in female, estrogen receptor positive ([name])   07/24/2017 Initial Diagnosis    Left breast palpable lump upper inner quadrant 11 o'clock position: Ill-defined 1.6 cm mass, axilla ultrasound negative, ultrasound-guided biopsy: Grade 2 IDC ER 90%, PR 40%, Ki-67 15%, HER-2 negative      08/09/2017 Genetic Testing    Negative genetic testing common hereditary cancer panel.  The Hereditary Gene Panel offered by [facility] includes sequencing and/or deletion duplication testing of the following 47 genes: APC, ATM, AXIN2, BARD1, BMPR1A, BRCA1, BRCA2, BRIP1, CDH1, CDK4, CDKN2A (p14ARF), CDKN2A (p16INK4a), CHEK2, CTNNA1, DICER1, EPCAM (Deletion/duplication testing only), GREM1 (promoter region deletion/duplication testing only), KIT, MEN1, MLH1, MSH2, MSH3, MSH6, MUTYH, NBN, NF1, NHTL1, PALB2, PDGFRA, PMS2, POLD1, POLE, PTEN, RAD50, RAD51C, RAD51D, SDHB, SDHC, SDHD, SMAD4, SMARCA4. STK11, TP53, TSC1, TSC2, and VHL.  The following genes were evaluated for sequence changes only: SDHA and HOXB13 c.251G>A variant only. The report date is August 07, 2017.      08/16/2017 Surgery    Left lumpectomy: IDC grade 2, 2.5 cm, DCIS intermediate grade, lymphovascular invasion identified, margins negative, 1/2 lymph nodes positive with extracapsular extension, ER 90%, PR 40%, HER-2 negative ratio 1.15, Ki-67 15% T2 N1a stage II a; Mammaprint high risk       09/12/2017 -  Chemotherapy    Adjuvant chemotherapy with dose dense Adriamycin and Cytoxan x4  followed by Taxol weekly x12        CURRENT THERAPY: weekly Taxol  INTERVAL HISTORY: [name] 37 y.o. female returns for evaluation prior to weekly Taxol.  She tolerated her first dose well.  She has noted some fullness in her ears.  She denies peripheral neuropathy.  She is requesting a decrease in the IV benadryl she received since it made her sleepy.     Patient Active Problem List   Diagnosis Date Noted  . Port-A-Cath in place 09/12/2017  . Genetic testing 08/09/2017  . Family history of breast cancer   . Family history of colon cancer   . Family history of cancer   . Malignant neoplasm of upper-inner quadrant of left breast in female, estrogen receptor positive ([name]) 07/30/2017  . Previous cesarean section 09/05/2015  . Postpartum care following cesarean delivery with btl (4/8) 09/04/2015  . Symptomatic cholelithiasis 11/20/2011    has No Known Allergies.  MEDICAL HISTORY: Past Medical History:  Diagnosis Date  . Anxiety   . Breast cancer, left ([name]) 07/2017  . Family history of breast cancer   . Family history of colon cancer     SURGICAL HISTORY: Past Surgical History:  Procedure Laterality Date  . BREAST LUMPECTOMY WITH RADIOACTIVE SEED AND SENTINEL LYMPH NODE BIOPSY Left 08/16/2017   Procedure: BREAST LUMPECTOMY WITH RADIOACTIVE SEED AND SENTINEL LYMPH NODE BIOPSY;  Surgeon: Excell Seltzer, MD;  Location: [facility];  Service: General;  Laterality: Left;  . BREAST LUMPECTOMY WITH SENTINEL LYMPH NODE BIOPSY Left 08/16/2017  . BREAST REDUCTION SURGERY Bilateral 08/23/2017   Procedure: LEFT ONCOPLASTIC REDUCTION, RIGHT BREAST REDUCTION;  Surgeon: Irene Limbo, MD;  Location: [facility];  Service: Plastics;  Laterality: Bilateral;  . CESAREAN SECTION  03/15/2012   Procedure: CESAREAN SECTION;  Surgeon: Lovenia Kim, MD;  Location: [facility];  Service: Obstetrics;  Laterality: N/A;  Primary cesarean section with delivery  of baby girl at 26. Apgars 4/7.  Marland Kitchen CESAREAN SECTION WITH BILATERAL TUBAL LIGATION Bilateral 09/03/2015   Procedure: REPEAT CESAREAN SECTION WITH BILATERAL TUBAL LIGATION;  Surgeon: Brien Few, MD;  Location: [facility];  Service: Obstetrics;  Laterality: Bilateral;  EDD: 09/10/15  . CHOLECYSTECTOMY  12/07/2011   Procedure: LAPAROSCOPIC CHOLECYSTECTOMY;  Surgeon: Harl Bowie, MD;  Location: [facility];  Service: General;  Laterality: N/A;  . PORTACATH PLACEMENT Right 09/11/2017   Procedure: INSERTION PORT-A-CATH;  Surgeon: Excell Seltzer, MD;  Location: [facility];  Service: General;  Laterality: Right;  . WISDOM TOOTH EXTRACTION  2002    SOCIAL HISTORY: Social History   Socioeconomic History  . Marital status: Married    Spouse name: Not on file  . Number of children: Not on file  . Years of education: Not on file  . Highest education level: Not on file  Occupational History  . Not on file  Social Needs  . Financial resource strain: Not on file  . Food insecurity:    Worry: Not on file    Inability: Not on file  . Transportation needs:    Medical: Not on file    Non-medical: Not on file  Tobacco Use  . Smoking status: Former Smoker    Packs/day: 0.00    Last attempt to quit: 05/27/2014    Years since quitting: 3.4  . Smokeless tobacco: Never Used  Substance and Sexual Activity  . Alcohol use: Yes    Comment: rarely  . Drug use: Yes    Types: Marijuana    Comment: reports she occsionally uses THC when vaping   . Sexual activity: Yes  Lifestyle  . Physical activity:    Days per week: Not on file    Minutes per session: Not on file  . Stress: Not on file  Relationships  . Social connections:    Talks on phone: Not on file    Gets together: Not on file    Attends religious service: Not on file    Active member of club or organization: Not on file    Attends meetings of clubs or organizations: Not on file    Relationship status: Not on file  . Intimate partner  violence:    Fear of current or ex partner: Not on file    Emotionally abused: Not on file    Physically abused: Not on file    Forced sexual activity: Not on file  Other Topics Concern  . Not on file  Social History Narrative  . Not on file    FAMILY HISTORY: Family History  Problem Relation Age of Onset  . Diabetes Mother   . Cancer Mother 16       chondrosarcoma  . Breast cancer Mother 30       DCIS vs Atypical Hyperplasia  . Breast cancer Other        Maternal Great Aunt; MGMs sister  . Breast cancer Other        Maternal Great Aunt  . Other Maternal Grandmother        d. from complications of a fall and breaking her hip  . Alcohol abuse Maternal Grandfather   . Colon cancer Paternal Grandmother  Review of Systems  Constitutional: Positive for fatigue (mild fatigue on Sunday). Negative for appetite change, chills, fever and unexpected weight change.  HENT:   Negative for hearing loss, lump/mass and trouble swallowing.   Eyes: Negative for eye problems and icterus.  Respiratory: Negative for chest tightness, cough and shortness of breath.   Cardiovascular: Negative for chest pain, leg swelling and palpitations.  Gastrointestinal: Negative for abdominal distention, abdominal pain, constipation, diarrhea, nausea and vomiting.  Endocrine: Negative for hot flashes.  Skin: Negative for itching and rash.  Neurological: Negative for dizziness, extremity weakness and headaches.  Hematological: Negative for adenopathy. Does not bruise/bleed easily.  Psychiatric/Behavioral: Negative for depression. The patient is not nervous/anxious.       PHYSICAL EXAMINATION  ECOG PERFORMANCE STATUS: 1 - Symptomatic but completely ambulatory  Vitals:   11/15/17 1033  BP: (!) 110/45  Pulse: 84  Resp: 18  Temp: 98.9 F (37.2 C)  SpO2: 99%    Physical Exam  Constitutional: She is oriented to person, place, and time. She appears well-developed and well-nourished.  HENT:  Head:  Normocephalic and atraumatic.  Mouth/Throat: Oropharynx is clear and moist. No oropharyngeal exudate.  Fluid noted in bilateral TM, no erythema, purulence, BLMs visible, no sign of infection   Eyes: Pupils are equal, round, and reactive to light. No scleral icterus.  Neck: Neck supple.  Cardiovascular: Normal rate, regular rhythm and normal heart sounds.  Pulmonary/Chest: Effort normal and breath sounds normal.  Abdominal: Soft. Bowel sounds are normal. She exhibits no distension and no mass. There is no tenderness. There is no rebound and no guarding.  Musculoskeletal: She exhibits no edema.  Lymphadenopathy:    She has no cervical adenopathy.  Neurological: She is alert and oriented to person, place, and time.  Skin: Skin is warm and dry. Capillary refill takes less than 2 seconds. No rash noted.  Psychiatric: She has a normal mood and affect.    LABORATORY DATA:  CBC    Component Value Date/Time   WBC 6.1 11/15/2017 1002   WBC 7.2 09/09/2017 0953   RBC 3.55 (L) 11/15/2017 1002   HGB 11.0 (L) 11/15/2017 1002   HCT 31.6 (L) 11/15/2017 1002   PLT 284 11/15/2017 1002   MCV 89.1 11/15/2017 1002   MCH 31.1 11/15/2017 1002   MCHC 34.9 11/15/2017 1002   RDW 17.0 (H) 11/15/2017 1002   LYMPHSABS 0.8 (L) 11/15/2017 1002   MONOABS 0.5 11/15/2017 1002   EOSABS 0.1 11/15/2017 1002   BASOSABS 0.1 11/15/2017 1002    CMP     Component Value Date/Time   NA 139 11/15/2017 1002   K 4.8 11/15/2017 1002   CL 106 11/15/2017 1002   CO2 25 11/15/2017 1002   GLUCOSE 96 11/15/2017 1002   BUN 12 11/15/2017 1002   CREATININE 0.77 11/15/2017 1002   CALCIUM 9.1 11/15/2017 1002   PROT 6.6 11/15/2017 1002   ALBUMIN 3.9 11/15/2017 1002   AST 27 11/15/2017 1002   ALT 40 11/15/2017 1002   ALKPHOS 88 11/15/2017 1002   BILITOT 0.4 11/15/2017 1002   GFRNONAA >60 11/15/2017 1002   GFRAA >60 11/15/2017 1002      ASSESSMENT and PLAN:   Malignant neoplasm of upper-inner quadrant of left breast  in female, estrogen receptor positive ([facility]) 08/19/2017:Left lumpectomy: IDC grade 2, 2.5 cm, DCIS intermediate grade, lymphovascular invasion identified, margins negative, 1/2 lymph nodes positive with extracapsular extension, ER 90%, PR 40%, HER-2 negative ratio 1.15, Ki-67 15% T2 N1a stage II  a; Mammaprint high risk   Pathology counseling: I discussed the final pathology report of the patient provided a copy of this report. I discussed the margins as well as lymph node surgeries. We also discussed the final staging along with previously performed ER/PR and HER-2/neu testing.  Recommendation: 1. Adjuvant chemotherapy with dose dense Adriamycin and Cytoxan x4 followed by Taxol weekly x12, echocardiogram 09/04/17 was normal with EF of 55-60%, echo 10/29/17 normal with EF 60-65% 2. followed by adjuvant radiation therapy 3. Followed by adjuvant antiestrogen therapy  _______________________________________________________________________________________________  Current treatment: Cycle 2 weekly Taxol  Kenora is doing well today. She tolerated Taxol well.  I did decrease her Benadryl today since the '50mg'$  made her sleepy last time.  She will proceed with Taxol today (labs pending, so long as CMET is within parameters).  Her ears have fluid noted in the TM.  I recommended flonase daily for her to take.    Arli will return weekly for labs and Taxol, and every other week will see myself or Dr. Lindi Adie.       All questions were answered. The patient knows to call the clinic with any problems, questions or concerns. We can certainly see the patient much sooner if necessary.  A total of (20) minutes of face-to-face time was spent with this patient with greater than 50% of that time in counseling and care-coordination.  This note was electronically signed. Scot Dock, NP 11/15/2017

## 2017-11-15 NOTE — Telephone Encounter (Signed)
Per 6/21 no los 

## 2017-11-15 NOTE — Patient Instructions (Signed)

## 2017-11-15 NOTE — Patient Instructions (Signed)
[facility] Discharge Instructions for Patients Receiving Chemotherapy  Today you received the following chemotherapy agents:  Taxol.  To help prevent nausea and vomiting after your treatment, we encourage you to take your nausea medication as directed.   If you develop nausea and vomiting that is not controlled by your nausea medication, call the clinic.   BELOW ARE SYMPTOMS THAT SHOULD BE REPORTED IMMEDIATELY:  *FEVER GREATER THAN 100.5 F  *CHILLS WITH OR WITHOUT FEVER  NAUSEA AND VOMITING THAT IS NOT CONTROLLED WITH YOUR NAUSEA MEDICATION  *UNUSUAL SHORTNESS OF BREATH  *UNUSUAL BRUISING OR BLEEDING  TENDERNESS IN MOUTH AND THROAT WITH OR WITHOUT PRESENCE OF ULCERS  *URINARY PROBLEMS  *BOWEL PROBLEMS  UNUSUAL RASH Items with * indicate a potential emergency and should be followed up as soon as possible.  Feel free to call the clinic should you have any questions or concerns. The clinic phone number is [phone].  Please show the CHEMO ALERT CARD at check-in to the Emergency Department and triage nurse.   

## 2017-11-22 ENCOUNTER — Inpatient Hospital Stay: Payer: 59

## 2017-11-22 ENCOUNTER — Other Ambulatory Visit: Payer: 59

## 2017-11-22 ENCOUNTER — Ambulatory Visit: Payer: 59

## 2017-11-22 VITALS — BP 127/69 | HR 85 | Temp 98.9°F | Resp 18

## 2017-11-22 DIAGNOSIS — Z170 Estrogen receptor positive status [ER+]: Principal | ICD-10-CM

## 2017-11-22 DIAGNOSIS — Z5111 Encounter for antineoplastic chemotherapy: Secondary | ICD-10-CM | POA: Diagnosis not present

## 2017-11-22 DIAGNOSIS — C50212 Malignant neoplasm of upper-inner quadrant of left female breast: Secondary | ICD-10-CM

## 2017-11-22 LAB — CBC WITH DIFFERENTIAL (CANCER CENTER ONLY)
BASOS ABS: 0.1 10*3/uL (ref 0.0–0.1)
Basophils Relative: 2 %
Eosinophils Absolute: 0.2 10*3/uL (ref 0.0–0.5)
Eosinophils Relative: 3 %
HEMATOCRIT: 32.4 % — AB (ref 34.8–46.6)
Hemoglobin: 10.9 g/dL — ABNORMAL LOW (ref 11.6–15.9)
LYMPHS ABS: 1 10*3/uL (ref 0.9–3.3)
LYMPHS PCT: 17 %
MCH: 30.7 pg (ref 25.1–34.0)
MCHC: 33.6 g/dL (ref 31.5–36.0)
MCV: 91.3 fL (ref 79.5–101.0)
MONO ABS: 0.3 10*3/uL (ref 0.1–0.9)
Monocytes Relative: 6 %
NEUTROS ABS: 4.4 10*3/uL (ref 1.5–6.5)
Neutrophils Relative %: 72 %
Platelet Count: 267 10*3/uL (ref 145–400)
RBC: 3.55 MIL/uL — ABNORMAL LOW (ref 3.70–5.45)
RDW: 16.1 % — AB (ref 11.2–14.5)
WBC Count: 6 10*3/uL (ref 3.9–10.3)

## 2017-11-22 LAB — COMPREHENSIVE METABOLIC PANEL
ALT: 54 U/L — ABNORMAL HIGH (ref 0–44)
AST: 35 U/L (ref 15–41)
Albumin: 3.7 g/dL (ref 3.5–5.0)
Alkaline Phosphatase: 78 U/L (ref 38–126)
Anion gap: 7 (ref 5–15)
BILIRUBIN TOTAL: 0.5 mg/dL (ref 0.3–1.2)
BUN: 13 mg/dL (ref 6–20)
CO2: 26 mmol/L (ref 22–32)
CREATININE: 0.72 mg/dL (ref 0.44–1.00)
Calcium: 8.6 mg/dL — ABNORMAL LOW (ref 8.9–10.3)
Chloride: 108 mmol/L (ref 98–111)
Glucose, Bld: 141 mg/dL — ABNORMAL HIGH (ref 70–99)
Potassium: 4.3 mmol/L (ref 3.5–5.1)
Sodium: 141 mmol/L (ref 135–145)
TOTAL PROTEIN: 6.5 g/dL (ref 6.5–8.1)

## 2017-11-22 MED ORDER — FAMOTIDINE IN NACL 20-0.9 MG/50ML-% IV SOLN
INTRAVENOUS | Status: AC
  Filled 2017-11-22: qty 50

## 2017-11-22 MED ORDER — DIPHENHYDRAMINE HCL 50 MG/ML IJ SOLN
INTRAMUSCULAR | Status: AC
  Filled 2017-11-22: qty 1

## 2017-11-22 MED ORDER — FAMOTIDINE IN NACL 20-0.9 MG/50ML-% IV SOLN
20.0000 mg | Freq: Once | INTRAVENOUS | Status: AC
  Administered 2017-11-22: 20 mg via INTRAVENOUS

## 2017-11-22 MED ORDER — SODIUM CHLORIDE 0.9 % IV SOLN
20.0000 mg | Freq: Once | INTRAVENOUS | Status: AC
  Administered 2017-11-22: 20 mg via INTRAVENOUS
  Filled 2017-11-22: qty 2

## 2017-11-22 MED ORDER — DIPHENHYDRAMINE HCL 50 MG/ML IJ SOLN
50.0000 mg | Freq: Once | INTRAMUSCULAR | Status: AC
  Administered 2017-11-22: 50 mg via INTRAVENOUS

## 2017-11-22 MED ORDER — SODIUM CHLORIDE 0.9 % IV SOLN
80.0000 mg/m2 | Freq: Once | INTRAVENOUS | Status: AC
  Administered 2017-11-22: 168 mg via INTRAVENOUS
  Filled 2017-11-22: qty 28

## 2017-11-22 MED ORDER — SODIUM CHLORIDE 0.9 % IV SOLN
Freq: Once | INTRAVENOUS | Status: AC
  Administered 2017-11-22: 10:00:00 via INTRAVENOUS

## 2017-11-22 MED ORDER — HEPARIN SOD (PORK) LOCK FLUSH 100 UNIT/ML IV SOLN
500.0000 [IU] | Freq: Once | INTRAVENOUS | Status: AC | PRN
  Administered 2017-11-22: 500 [IU]
  Filled 2017-11-22: qty 5

## 2017-11-22 MED ORDER — SODIUM CHLORIDE 0.9% FLUSH
10.0000 mL | INTRAVENOUS | Status: DC | PRN
  Administered 2017-11-22: 10 mL
  Filled 2017-11-22: qty 10

## 2017-11-22 NOTE — Patient Instructions (Signed)
[facility] Discharge Instructions for Patients Receiving Chemotherapy  Today you received the following chemotherapy agents:  Taxol.  To help prevent nausea and vomiting after your treatment, we encourage you to take your nausea medication as directed.   If you develop nausea and vomiting that is not controlled by your nausea medication, call the clinic.   BELOW ARE SYMPTOMS THAT SHOULD BE REPORTED IMMEDIATELY:  *FEVER GREATER THAN 100.5 F  *CHILLS WITH OR WITHOUT FEVER  NAUSEA AND VOMITING THAT IS NOT CONTROLLED WITH YOUR NAUSEA MEDICATION  *UNUSUAL SHORTNESS OF BREATH  *UNUSUAL BRUISING OR BLEEDING  TENDERNESS IN MOUTH AND THROAT WITH OR WITHOUT PRESENCE OF ULCERS  *URINARY PROBLEMS  *BOWEL PROBLEMS  UNUSUAL RASH Items with * indicate a potential emergency and should be followed up as soon as possible.  Feel free to call the clinic should you have any questions or concerns. The clinic phone number is [phone].  Please show the CHEMO ALERT CARD at check-in to the Emergency Department and triage nurse.   

## 2017-11-29 ENCOUNTER — Inpatient Hospital Stay: Payer: BLUE CROSS/BLUE SHIELD

## 2017-11-29 ENCOUNTER — Telehealth: Payer: Self-pay

## 2017-11-29 ENCOUNTER — Encounter: Payer: Self-pay | Admitting: Hematology and Oncology

## 2017-11-29 ENCOUNTER — Telehealth: Payer: Self-pay | Admitting: Hematology and Oncology

## 2017-11-29 ENCOUNTER — Inpatient Hospital Stay: Payer: BLUE CROSS/BLUE SHIELD | Admitting: Hematology and Oncology

## 2017-11-29 MED ORDER — SODIUM CHLORIDE 0.9% FLUSH
10.0000 mL | INTRAVENOUS | Status: AC | PRN
  Filled 2017-11-29: qty 10

## 2017-11-29 MED ORDER — HEPARIN SOD (PORK) LOCK FLUSH 100 UNIT/ML IV SOLN
500.0000 [IU] | Freq: Once | INTRAVENOUS | Status: AC | PRN
  Filled 2017-11-29: qty 5

## 2017-11-29 NOTE — Assessment & Plan Note (Deleted)
08/19/2017:Left lumpectomy: IDC grade 2, 2.5 cm, DCIS intermediate grade, lymphovascular invasion identified, margins negative, 1/2 lymph nodes positive with extracapsular extension, ER 90%, PR 40%, HER-2 negative ratio 1.15, Ki-67 15% T2 N1a stage II a; Mammaprint high risk   Treatment plan: 1. Adjuvant chemotherapy with dose dense Adriamycin and Cytoxan x4 followed by Taxol weekly x12, echocardiogram 09/04/17 was normal with EF of 55-60%, echo 10/29/17 normal with EF 60-65% 2. followed by adjuvant radiation therapy 3. Followed by adjuvant antiestrogen therapy  _______________________________________________________________________________________________  Current treatment: Cycle4 weekly Taxol Taxol toxicities:  Return to clinic weekly for Taxol treatments every 2 weeks for follow-up with me

## 2017-11-29 NOTE — Progress Notes (Signed)
The breast fund opened so I applied for copay assistance and pt was approved w/ The Assistance Fund to cover chemotherapy from 11/29/17.  Rep stated they will go back and cover claims from 05/28/17.

## 2017-11-29 NOTE — Telephone Encounter (Signed)
Patient r/s appts and added more treatments.  Provider aware.

## 2017-12-05 ENCOUNTER — Ambulatory Visit: Payer: BLUE CROSS/BLUE SHIELD | Attending: General Surgery | Admitting: Physical Therapy

## 2017-12-05 ENCOUNTER — Encounter: Payer: Self-pay | Admitting: Physical Therapy

## 2017-12-05 ENCOUNTER — Other Ambulatory Visit: Payer: Self-pay

## 2017-12-05 DIAGNOSIS — Z170 Estrogen receptor positive status [ER+]: Secondary | ICD-10-CM | POA: Diagnosis not present

## 2017-12-05 DIAGNOSIS — M25612 Stiffness of left shoulder, not elsewhere classified: Secondary | ICD-10-CM

## 2017-12-05 DIAGNOSIS — C50212 Malignant neoplasm of upper-inner quadrant of left female breast: Secondary | ICD-10-CM | POA: Diagnosis not present

## 2017-12-05 DIAGNOSIS — R293 Abnormal posture: Secondary | ICD-10-CM

## 2017-12-05 DIAGNOSIS — Z483 Aftercare following surgery for neoplasm: Secondary | ICD-10-CM | POA: Insufficient documentation

## 2017-12-05 NOTE — Therapy (Signed)
[place], [place], [address] Phone: [phone]   Fax:  [phone]  Physical Therapy Treatment  Patient Details  Name: [name] MRN: [id number] Date of Birth: 08/21/1980 Referring Provider: Dr. Excell Seltzer   Encounter Date: 12/05/2017  PT End of Session - 12/05/17 1054    Visit Number  4    Number of Visits  6    Date for PT Re-Evaluation  01/23/18    PT Start Time  9163    PT Stop Time  1054    PT Time Calculation (min)  39 min    Activity Tolerance  Patient tolerated treatment well    Behavior During Therapy  [facility] for tasks assessed/performed       Past Medical History:  Diagnosis Date  . Anxiety   . Breast cancer, left ([place]) 07/2017  . Family history of breast cancer   . Family history of colon cancer     Past Surgical History:  Procedure Laterality Date  . BREAST LUMPECTOMY WITH RADIOACTIVE SEED AND SENTINEL LYMPH NODE BIOPSY Left 08/16/2017   Procedure: BREAST LUMPECTOMY WITH RADIOACTIVE SEED AND SENTINEL LYMPH NODE BIOPSY;  Surgeon: Excell Seltzer, MD;  Location: [facility];  Service: General;  Laterality: Left;  . BREAST LUMPECTOMY WITH SENTINEL LYMPH NODE BIOPSY Left 08/16/2017  . BREAST REDUCTION SURGERY Bilateral 08/23/2017   Procedure: LEFT ONCOPLASTIC REDUCTION, RIGHT BREAST REDUCTION;  Surgeon: Irene Limbo, MD;  Location: [facility];  Service: Plastics;  Laterality: Bilateral;  . CESAREAN SECTION  03/15/2012   Procedure: CESAREAN SECTION;  Surgeon: Lovenia Kim, MD;  Location: [facility];  Service: Obstetrics;  Laterality: N/A;  Primary cesarean section with delivery of baby girl at 46. Apgars 4/7.  Marland Kitchen CESAREAN SECTION WITH BILATERAL TUBAL LIGATION Bilateral 09/03/2015   Procedure: REPEAT CESAREAN SECTION WITH BILATERAL TUBAL LIGATION;  Surgeon: Brien Few, MD;  Location: [facility];  Service: Obstetrics;  Laterality: Bilateral;   EDD: 09/10/15  . CHOLECYSTECTOMY  12/07/2011   Procedure: LAPAROSCOPIC CHOLECYSTECTOMY;  Surgeon: Harl Bowie, MD;  Location: [facility];  Service: General;  Laterality: N/A;  . PORTACATH PLACEMENT Right 09/11/2017   Procedure: INSERTION PORT-A-CATH;  Surgeon: Excell Seltzer, MD;  Location[facility];  Service: General;  Laterality: Right;  . [place] EXTRACTION  2002    There were no vitals filed for this visit.  Subjective Assessment - 12/05/17 1024    Subjective  I think I'm doing well. No c/o neuropathy symptoms. My shoulder ROM seems fine. Some c/o fatigue but it seems ok. I'm not exercising.    Pertinent History  Patient underwent a left lumpectomy and sentinel node biopsy (1/2 positive axillary nodes) on 08/16/17. She had a bilateral reduction on 08/23/17. Patient was diagnosed 07/23/17 with left grade 2 invasive ductal carcinoma breast cancer.  It is ER/PR positive and HEr2 negative with a ki67 of 15%.    Patient Stated Goals  Make sure my arm is ok    Currently in Pain?  No/denies         [facility] PT Assessment - 12/05/17 0001      AROM   Right/Left Shoulder  Left    Left Shoulder Extension  65 Degrees    Left Shoulder Flexion  158 Degrees    Left Shoulder ABduction  148 Degrees    Left Shoulder Internal Rotation  61 Degrees    Left Shoulder External Rotation  80 Degrees  LYMPHEDEMA/ONCOLOGY QUESTIONNAIRE - 12/05/17 1035      Right Upper Extremity Lymphedema   10 cm Proximal to Olecranon Process  34.5 cm    Olecranon Process  28.3 cm    10 cm Proximal to Ulnar Styloid Process  24.8 cm    Just Proximal to Ulnar Styloid Process  17.5 cm    Across Hand at [employer]  19.6 cm    At [place] of 2nd Digit  6.5 cm      Left Upper Extremity Lymphedema   10 cm Proximal to Olecranon Process  35.7 cm    Olecranon Process  28.4 cm    10 cm Proximal to Ulnar Styloid Process  24.8 cm    Just Proximal to Ulnar Styloid Process  17.8 cm    Across Hand at [employer]   18.9 cm    At [place] of 2nd Digit  6.4 cm        Quick Dash - 12/05/17 0001    Open a tight or new jar  No difficulty    Do heavy household chores (wash walls, wash floors)  No difficulty    Carry a shopping bag or briefcase  No difficulty    Wash your back  No difficulty    Use a knife to cut food  No difficulty    Recreational activities in which you take some force or impact through your arm, shoulder, or hand (golf, hammering, tennis)  No difficulty    During the past week, to what extent has your arm, shoulder or hand problem interfered with your normal social activities with family, friends, neighbors, or groups?  Not at all    During the past week, to what extent has your arm, shoulder or hand problem limited your work or other regular daily activities  Not at all    Arm, shoulder, or hand pain.  None    Tingling (pins and needles) in your arm, shoulder, or hand  None    Difficulty Sleeping  No difficulty    DASH Score  0 %                     PT Education - 12/05/17 1053    Education provided  Yes    Education Details  Educated pt on lymphedema treatment as she has mild changes in her left upper arm. Educated her on where and how to get a compression sleeve and glove for exeicse and flying. Further educated her on the importance of a walking program.    Person(s) Educated  Patient    Methods  Explanation;Handout    Comprehension  Verbalized understanding          PT Long Term Goals - 12/05/17 1057      PT LONG TERM GOAL #1   Title  Patient will be able to demonstrate she has regained shoulder ROM and function post operatively.    Time  4    Period  Months    Status  Achieved      PT LONG TERM GOAL #2   Title  Patient will demonstrate active ROM flexion and abduction has returned to 160 degrees for increased ease reaching overhead.    Time  4    Period  Months    Status  On-going      PT LONG TERM GOAL #3   Title  Patient will report >/= 50%  improvement in edema on her bilateral trunk area just below  her axillae.    Time  4    Period  Months    Status  Achieved      PT LONG TERM GOAL #4   Title  Patient will report she has returned to all normal daily tasks without difficulty.    Time  4    Period  Months    Status  Achieved      Breast Clinic Goals - 07/31/17 1058      Patient will be able to verbalize understanding of pertinent lymphedema risk reduction practices relevant to her diagnosis specifically related to skin care.   Time  1    Period  Days    Status  Achieved      Patient will be able to return demonstrate and/or verbalize understanding of the post-op home exercise program related to regaining shoulder range of motion.   Time  1    Period  Days    Status  Achieved      Patient will be able to verbalize understanding of the importance of attending the postoperative After Breast Cancer Class for further lymphedema risk reduction education and therapeutic exercise.   Time  1    Period  Days    Status  Achieved           Plan - 12/05/17 1054    Clinical Impression Statement  Patient continues to be doing well and is half way through chmotherapy. She has no signs of neuropathy and shoulder ROM is good. She does have mild changes in her left upper arm with increased circumference compared to baseline. Her weight 4 months ago is the same as it was when she was last weighed last month but she feels she has gained weight in the past month sothat would be the cause, although her right arm has not increased to that degree. She was encouraged to begin a walking program for overall health, to reduce recurrence risk and keep her strong during chemo and reduce fatigue. She was also educated on getting a compression garment for flying and to wear while walking.    Rehab Potential  Excellent    Clinical Impairments Affecting Rehab Potential  None    PT Frequency  Monthy    PT Treatment/Interventions  ADLs/Self Care Home  Management;Therapeutic exercise;Patient/family education;Passive range of motion;Scar mobilization;Manual techniques;Manual lymph drainage    PT Next Visit Plan  Remeasure BUE circumference.  Assess for signs of CIPN. Will be almost done with 12 Taxol treatments at next visit. Check sleeve fit.    PT Home Exercise Plan  Post op shoulder ROM HEP    Consulted and Agree with Plan of Care  Patient       Patient will benefit from skilled therapeutic intervention in order to improve the following deficits and impairments:  Decreased knowledge of precautions, Impaired UE functional use, Decreased range of motion, Postural dysfunction, Pain, Increased fascial restricitons, Increased edema  Visit Diagnosis: Malignant neoplasm of upper-inner quadrant of left breast in female, estrogen receptor positive (HCC)  Abnormal posture  Aftercare following surgery for neoplasm  Stiffness of left shoulder, not elsewhere classified     Problem List Patient Active Problem List   Diagnosis Date Noted  . Port-A-Cath in place 09/12/2017  . Genetic testing 08/09/2017  . Family history of breast cancer   . Family history of colon cancer   . Family history of cancer   . Malignant neoplasm of upper-inner quadrant of left breast in female, estrogen receptor positive ([place])  07/30/2017  . Previous cesarean section 09/05/2015  . Postpartum care following cesarean delivery with btl (4/8) 09/04/2015  . Symptomatic cholelithiasis 11/20/2011    Annia Friendly, PT 12/05/17 11:00 AM  [place], [place], [address] Phone: [phone]   Fax:  [phone]  Name: [name] MRN: [id number] Date of Birth: 1981-02-26

## 2017-12-06 ENCOUNTER — Inpatient Hospital Stay: Payer: BLUE CROSS/BLUE SHIELD

## 2017-12-06 ENCOUNTER — Inpatient Hospital Stay (HOSPITAL_BASED_OUTPATIENT_CLINIC_OR_DEPARTMENT_OTHER): Payer: BLUE CROSS/BLUE SHIELD | Admitting: Hematology and Oncology

## 2017-12-06 ENCOUNTER — Inpatient Hospital Stay: Payer: BLUE CROSS/BLUE SHIELD | Attending: Hematology and Oncology

## 2017-12-06 DIAGNOSIS — Z5111 Encounter for antineoplastic chemotherapy: Secondary | ICD-10-CM

## 2017-12-06 DIAGNOSIS — C50212 Malignant neoplasm of upper-inner quadrant of left female breast: Secondary | ICD-10-CM | POA: Diagnosis not present

## 2017-12-06 DIAGNOSIS — Z170 Estrogen receptor positive status [ER+]: Secondary | ICD-10-CM | POA: Diagnosis not present

## 2017-12-06 DIAGNOSIS — Z79899 Other long term (current) drug therapy: Secondary | ICD-10-CM

## 2017-12-06 DIAGNOSIS — Z95828 Presence of other vascular implants and grafts: Secondary | ICD-10-CM

## 2017-12-06 LAB — CMP (CANCER CENTER ONLY)
ALBUMIN: 3.8 g/dL (ref 3.5–5.0)
ALT: 58 U/L — AB (ref 0–44)
AST: 38 U/L (ref 15–41)
Alkaline Phosphatase: 84 U/L (ref 38–126)
Anion gap: 7 (ref 5–15)
BUN: 10 mg/dL (ref 6–20)
CHLORIDE: 108 mmol/L (ref 98–111)
CO2: 26 mmol/L (ref 22–32)
CREATININE: 0.79 mg/dL (ref 0.44–1.00)
Calcium: 8.9 mg/dL (ref 8.9–10.3)
GFR, Est AFR Am: >60 mL/min (ref >60)
GFR, Estimated: >60 mL/min (ref >60)
GLUCOSE: 80 mg/dL (ref 70–99)
POTASSIUM: 4.4 mmol/L (ref 3.5–5.1)
Sodium: 141 mmol/L (ref 135–145)
Total Bilirubin: 0.4 mg/dL (ref 0.3–1.2)
Total Protein: 6.5 g/dL (ref 6.5–8.1)

## 2017-12-06 LAB — CBC WITH DIFFERENTIAL (CANCER CENTER ONLY)
BASOS ABS: 0.1 10*3/uL (ref 0.0–0.1)
BASOS PCT: 1 %
Eosinophils Absolute: 0.2 10*3/uL (ref 0.0–0.5)
Eosinophils Relative: 3 %
HEMATOCRIT: 33.2 % — AB (ref 34.8–46.6)
HEMOGLOBIN: 11.2 g/dL — AB (ref 11.6–15.9)
LYMPHS PCT: 17 %
Lymphs Abs: 1 10*3/uL (ref 0.9–3.3)
MCH: 30.6 pg (ref 25.1–34.0)
MCHC: 33.7 g/dL (ref 31.5–36.0)
MCV: 90.7 fL (ref 79.5–101.0)
MONO ABS: 0.5 10*3/uL (ref 0.1–0.9)
Monocytes Relative: 8 %
NEUTROS ABS: 4.3 10*3/uL (ref 1.5–6.5)
NEUTROS PCT: 71 %
Platelet Count: 217 10*3/uL (ref 145–400)
RBC: 3.66 MIL/uL — AB (ref 3.70–5.45)
RDW: 14.9 % — AB (ref 11.2–14.5)
WBC: 6 10*3/uL (ref 3.9–10.3)

## 2017-12-06 MED ORDER — SODIUM CHLORIDE 0.9 % IV SOLN
20.0000 mg | Freq: Once | INTRAVENOUS | Status: AC
  Administered 2017-12-06: 20 mg via INTRAVENOUS
  Filled 2017-12-06: qty 2

## 2017-12-06 MED ORDER — FAMOTIDINE IN NACL 20-0.9 MG/50ML-% IV SOLN
INTRAVENOUS | Status: AC
  Filled 2017-12-06: qty 50

## 2017-12-06 MED ORDER — HEPARIN SOD (PORK) LOCK FLUSH 100 UNIT/ML IV SOLN
500.0000 [IU] | Freq: Once | INTRAVENOUS | Status: AC | PRN
  Administered 2017-12-06: 500 [IU]
  Filled 2017-12-06: qty 5

## 2017-12-06 MED ORDER — DIPHENHYDRAMINE HCL 50 MG/ML IJ SOLN
50.0000 mg | Freq: Once | INTRAMUSCULAR | Status: AC
  Administered 2017-12-06: 50 mg via INTRAVENOUS

## 2017-12-06 MED ORDER — DIPHENHYDRAMINE HCL 50 MG/ML IJ SOLN
INTRAMUSCULAR | Status: AC
Start: 2017-12-06 — End: ?
  Filled 2017-12-06: qty 1

## 2017-12-06 MED ORDER — SODIUM CHLORIDE 0.9% FLUSH
10.0000 mL | INTRAVENOUS | Status: DC | PRN
  Administered 2017-12-06: 10 mL
  Filled 2017-12-06: qty 10

## 2017-12-06 MED ORDER — PACLITAXEL CHEMO INJECTION 300 MG/50ML
80.0000 mg/m2 | Freq: Once | INTRAVENOUS | Status: AC
  Administered 2017-12-06: 168 mg via INTRAVENOUS
  Filled 2017-12-06: qty 28

## 2017-12-06 MED ORDER — SODIUM CHLORIDE 0.9 % IV SOLN
Freq: Once | INTRAVENOUS | Status: AC
  Administered 2017-12-06: 12:00:00 via INTRAVENOUS

## 2017-12-06 MED ORDER — FAMOTIDINE IN NACL 20-0.9 MG/50ML-% IV SOLN
20.0000 mg | Freq: Once | INTRAVENOUS | Status: AC
  Administered 2017-12-06: 20 mg via INTRAVENOUS

## 2017-12-06 NOTE — Progress Notes (Signed)
Patient Care Team: Jonathon Jordan, MD as PCP - General (Family Medicine)  DIAGNOSIS:  Encounter Diagnosis  Name Primary?  . Malignant neoplasm of upper-inner quadrant of left breast in female, estrogen receptor positive ([place])     SUMMARY OF ONCOLOGIC HISTORY:   Malignant neoplasm of upper-inner quadrant of left breast in female, estrogen receptor positive ([place])   07/24/2017 Initial Diagnosis    Left breast palpable lump upper inner quadrant 11 o'clock position: Ill-defined 1.6 cm mass, axilla ultrasound negative, ultrasound-guided biopsy: Grade 2 IDC ER 90%, PR 40%, Ki-67 15%, HER-2 negative      08/09/2017 Genetic Testing    Negative genetic testing common hereditary cancer panel.  The Hereditary Gene Panel offered by [facility] includes sequencing and/or deletion duplication testing of the following 47 genes: APC, ATM, AXIN2, BARD1, BMPR1A, BRCA1, BRCA2, BRIP1, CDH1, CDK4, CDKN2A (p14ARF), CDKN2A (p16INK4a), CHEK2, CTNNA1, DICER1, EPCAM (Deletion/duplication testing only), GREM1 (promoter region deletion/duplication testing only), KIT, MEN1, MLH1, MSH2, MSH3, MSH6, MUTYH, NBN, NF1, NHTL1, PALB2, PDGFRA, PMS2, POLD1, POLE, PTEN, RAD50, RAD51C, RAD51D, SDHB, SDHC, SDHD, SMAD4, SMARCA4. STK11, TP53, TSC1, TSC2, and VHL.  The following genes were evaluated for sequence changes only: SDHA and HOXB13 c.251G>A variant only. The report date is August 07, 2017.      08/16/2017 Surgery    Left lumpectomy: IDC grade 2, 2.5 cm, DCIS intermediate grade, lymphovascular invasion identified, margins negative, 1/2 lymph nodes positive with extracapsular extension, ER 90%, PR 40%, HER-2 negative ratio 1.15, Ki-67 15% T2 N1a stage II a; Mammaprint high risk       09/12/2017 -  Chemotherapy    Adjuvant chemotherapy with dose dense Adriamycin and Cytoxan x4 followed by Taxol weekly x12        CHIEF COMPLIANT: Cycle 4 Taxol  INTERVAL HISTORY: SEDALIA GREESON is a 37 year old with above-mentioned history  of left breast cancer treated with lumpectomy and is now on adjuvant chemotherapy today cycle 4 of Taxol.  She appears to be tolerating Taxol extremely well.  She does not have any nausea vomiting.  She did have some fatigue  .  REVIEW OF SYSTEMS:   Constitutional: Denies fevers, chills or abnormal weight loss Eyes: Denies blurriness of vision Ears, nose, mouth, throat, and face: Denies mucositis or sore throat Respiratory: Denies cough, dyspnea or wheezes Cardiovascular: Denies palpitation, chest discomfort Gastrointestinal:  Denies nausea, heartburn or change in bowel habits Skin: Denies abnormal skin rashes Lymphatics: Denies new lymphadenopathy or easy bruising Neurological:Denies numbness, tingling or new weaknesses Behavioral/Psych: Mood is stable, no new changes  Extremities: No lower extremity edema   All other systems were reviewed with the patient and are negative.  I have reviewed the past medical history, past surgical history, social history and family history with the patient and they are unchanged from previous note.  ALLERGIES:  has No Known Allergies.  MEDICATIONS:  Current Outpatient Medications  Medication Sig Dispense Refill  . ALPRAZolam (XANAX) 0.25 MG tablet Take 0.25 mg by mouth daily as needed for anxiety.  2  . escitalopram (LEXAPRO) 20 MG tablet Take 20 mg by mouth daily with supper.     Marland Kitchen ibuprofen (ADVIL,MOTRIN) 200 MG tablet Take 200-800 mg by mouth every 8 (eight) hours as needed (for pain.).     Marland Kitchen lidocaine-prilocaine (EMLA) cream Apply to affected area once 30 g 3  . LORazepam (ATIVAN) 0.5 MG tablet Take 1 tablet (0.5 mg total) by mouth at bedtime as needed (Nausea or vomiting). 30 tablet 0  .  ondansetron (ZOFRAN) 8 MG tablet Take 1 tablet (8 mg total) by mouth 2 (two) times daily as needed. Start on the third day after chemotherapy. 30 tablet 1  . prochlorperazine (COMPAZINE) 10 MG tablet Take 1 tablet (10 mg total) by mouth every 6 (six) hours as  needed (Nausea or vomiting). 30 tablet 1  . valACYclovir (VALTREX) 500 MG tablet Take 1 tablet (500 mg total) by mouth 2 (two) times daily. 60 tablet 6   No current facility-administered medications for this visit.    Facility-Administered Medications Ordered in Other Visits  Medication Dose Route Frequency Provider Last Rate Last Dose  . heparin lock flush 100 unit/mL  500 Units Intracatheter Once PRN Nicholas Lose, MD      . heparin lock flush 100 unit/mL  500 Units Intracatheter Once PRN Nicholas Lose, MD      . PACLitaxel (TAXOL) 168 mg in sodium chloride 0.9 % 250 mL chemo infusion (</= 67m/m2)  80 mg/m2 (Treatment Plan Recorded) Intravenous Once GNicholas Lose MD 278 mL/hr at 12/06/17 1302 168 mg at 12/06/17 1302  . sodium chloride flush (NS) 0.9 % injection 10 mL  10 mL Intracatheter PRN GNicholas Lose MD   10 mL at 11/15/17 1449  . sodium chloride flush (NS) 0.9 % injection 10 mL  10 mL Intracatheter PRN GNicholas Lose MD      . sodium chloride flush (NS) 0.9 % injection 10 mL  10 mL Intracatheter PRN GNicholas Lose MD        PHYSICAL EXAMINATION: ECOG PERFORMANCE STATUS: 1 - Symptomatic but completely ambulatory  Vitals:   12/06/17 1103  BP: 125/75  Pulse: 83  Resp: 20  Temp: 98.8 F (37.1 C)  SpO2: 98%   Filed Weights   12/06/17 1103  Weight: 235 lb 12.8 oz (107 kg)    GENERAL:alert, no distress and comfortable SKIN: skin color, texture, turgor are normal, no rashes or significant lesions EYES: normal, Conjunctiva are pink and non-injected, sclera clear OROPHARYNX:no exudate, no erythema and lips, buccal mucosa, and tongue normal  NECK: supple, thyroid normal size, non-tender, without nodularity LYMPH:  no palpable lymphadenopathy in the cervical, axillary or inguinal LUNGS: clear to auscultation and percussion with normal breathing effort HEART: regular rate & rhythm and no murmurs and no lower extremity edema ABDOMEN:abdomen soft, non-tender and normal bowel  sounds MUSCULOSKELETAL:no cyanosis of digits and no clubbing  NEURO: alert & oriented x 3 with fluent speech, no focal motor/sensory deficits EXTREMITIES: No lower extremity edema BREAST: No palpable masses or nodules in either right or left breasts. No palpable axillary supraclavicular or infraclavicular adenopathy no breast tenderness or nipple discharge. (exam performed in the presence of a chaperone)  LABORATORY DATA:  I have reviewed the data as listed CMP Latest Ref Rng & Units 12/06/2017 11/22/2017 11/15/2017  Glucose 70 - 99 mg/dL 80 141(H) 96  BUN 6 - 20 mg/dL _0 Creatinine 0.44 - 1.00 mg/dL 0.79 0.72 0.77  Sodium 135 - 145 mmol/L 141 141 139  Potassium 3.5 - 5.1 mmol/L 4.4 4.3 4.8  Chloride 98 - 111 mmol/L 108 108 106  CO2 22 - 32 mmol/L _1 Calcium 8.9 - 10.3 mg/dL 8.9 8.6(L) 9.1  Total Protein 6.5 - 8.1 g/dL 6.5 6.5 6.6  Total Bilirubin 0.3 - 1.2 mg/dL 0.4 0.5 0.4  Alkaline Phos 38 - 126 U/L 84 78 88  AST 15 - 41 U/L 38 35 27  ALT 0 - 44 U/L 58(H) 54(H)  40    Lab Results  Component Value Date   WBC 6.0 12/06/2017   HGB 11.2 (L) 12/06/2017   HCT 33.2 (L) 12/06/2017   MCV 90.7 12/06/2017   PLT 217 12/06/2017   NEUTROABS 4.3 12/06/2017    ASSESSMENT & PLAN:  Malignant neoplasm of upper-inner quadrant of left breast in female, estrogen receptor positive ([place]) 08/19/2017:Left lumpectomy: IDC grade 2, 2.5 cm, DCIS intermediate grade, lymphovascular invasion identified, margins negative, 1/2 lymph nodes positive with extracapsular extension, ER 90%, PR 40%, HER-2 negative ratio 1.15, Ki-67 15% T2 N1a stage II a; Mammaprint high risk   Pathology counseling: I discussed the final pathology report of the patient provided a copy of this report. I discussed the margins as well as lymph node surgeries. We also discussed the final staging along with previously performed ER/PR and HER-2/neu testing.  Recommendation: 1. Adjuvant chemotherapy with dose dense  Adriamycin and Cytoxan x4 followed by Taxol weekly x12, echocardiogram 09/04/17 was normal with EF of 55-60%, echo 10/29/17 normal with EF 60-65% 2. followed by adjuvant radiation therapy 3. Followed by adjuvant antiestrogen therapy  _______________________________________________________________________________________________  Current treatment: Cycle4 weekly Taxol Taxol toxicities: Patient is using ice bags to prevent neuropathy. Denies any nausea vomiting.  I discussed with her extensively about limiting the amount of carbohydrates that she takes Synthroid chemotherapy because she is already gaining weight.  I discussed that it is very difficult to lose her weight when she gains.  Because this instructed her to quit eating any junk food.  Return to clinic weekly for Taxol chemo and every 2 weeks to follow-up with me   No orders of the defined types were placed in this encounter.  The patient has a good understanding of the overall plan. she agrees with it. she will call with any problems that may develop before the next visit here.   Harriette Ohara, MD 12/06/17

## 2017-12-06 NOTE — Patient Instructions (Signed)
[facility] Discharge Instructions for Patients Receiving Chemotherapy  Today you received the following chemotherapy agents:  Taxol.  To help prevent nausea and vomiting after your treatment, we encourage you to take your nausea medication as directed.   If you develop nausea and vomiting that is not controlled by your nausea medication, call the clinic.   BELOW ARE SYMPTOMS THAT SHOULD BE REPORTED IMMEDIATELY:  *FEVER GREATER THAN 100.5 F  *CHILLS WITH OR WITHOUT FEVER  NAUSEA AND VOMITING THAT IS NOT CONTROLLED WITH YOUR NAUSEA MEDICATION  *UNUSUAL SHORTNESS OF BREATH  *UNUSUAL BRUISING OR BLEEDING  TENDERNESS IN MOUTH AND THROAT WITH OR WITHOUT PRESENCE OF ULCERS  *URINARY PROBLEMS  *BOWEL PROBLEMS  UNUSUAL RASH Items with * indicate a potential emergency and should be followed up as soon as possible.  Feel free to call the clinic should you have any questions or concerns. The clinic phone number is [phone].  Please show the CHEMO ALERT CARD at check-in to the Emergency Department and triage nurse.   

## 2017-12-06 NOTE — Assessment & Plan Note (Signed)
08/19/2017:Left lumpectomy: IDC grade 2, 2.5 cm, DCIS intermediate grade, lymphovascular invasion identified, margins negative, 1/2 lymph nodes positive with extracapsular extension, ER 90%, PR 40%, HER-2 negative ratio 1.15, Ki-67 15% T2 N1a stage II a; Mammaprint high risk   Pathology counseling: I discussed the final pathology report of the patient provided a copy of this report. I discussed the margins as well as lymph node surgeries. We also discussed the final staging along with previously performed ER/PR and HER-2/neu testing.  Recommendation: 1. Adjuvant chemotherapy with dose dense Adriamycin and Cytoxan x4 followed by Taxol weekly x12, echocardiogram 09/04/17 was normal with EF of 55-60%, echo 10/29/17 normal with EF 60-65% 2. followed by adjuvant radiation therapy 3. Followed by adjuvant antiestrogen therapy  _______________________________________________________________________________________________  Current treatment: Cycle4 weekly Taxol Taxol toxicities:  Return to clinic weekly for Taxol chemo and every 2 weeks to follow-up with me

## 2017-12-13 ENCOUNTER — Inpatient Hospital Stay (HOSPITAL_BASED_OUTPATIENT_CLINIC_OR_DEPARTMENT_OTHER): Payer: BLUE CROSS/BLUE SHIELD | Admitting: Adult Health

## 2017-12-13 ENCOUNTER — Encounter: Payer: Self-pay | Admitting: Adult Health

## 2017-12-13 ENCOUNTER — Inpatient Hospital Stay: Payer: BLUE CROSS/BLUE SHIELD

## 2017-12-13 ENCOUNTER — Telehealth: Payer: Self-pay | Admitting: Adult Health

## 2017-12-13 VITALS — BP 121/76 | HR 97 | Temp 99.0°F | Resp 20 | Ht 66.0 in | Wt 236.4 lb

## 2017-12-13 DIAGNOSIS — C50212 Malignant neoplasm of upper-inner quadrant of left female breast: Secondary | ICD-10-CM | POA: Diagnosis not present

## 2017-12-13 DIAGNOSIS — Z95828 Presence of other vascular implants and grafts: Secondary | ICD-10-CM

## 2017-12-13 DIAGNOSIS — Z5111 Encounter for antineoplastic chemotherapy: Secondary | ICD-10-CM

## 2017-12-13 DIAGNOSIS — Z170 Estrogen receptor positive status [ER+]: Secondary | ICD-10-CM | POA: Diagnosis not present

## 2017-12-13 DIAGNOSIS — Z79899 Other long term (current) drug therapy: Secondary | ICD-10-CM | POA: Diagnosis not present

## 2017-12-13 LAB — CMP (CANCER CENTER ONLY)
ALBUMIN: 3.7 g/dL (ref 3.5–5.0)
ALT: 41 U/L (ref 0–44)
ANION GAP: 7 (ref 5–15)
AST: 24 U/L (ref 15–41)
Alkaline Phosphatase: 88 U/L (ref 38–126)
BUN: 15 mg/dL (ref 6–20)
CHLORIDE: 106 mmol/L (ref 98–111)
CO2: 25 mmol/L (ref 22–32)
Calcium: 9.1 mg/dL (ref 8.9–10.3)
Creatinine: 0.79 mg/dL (ref 0.44–1.00)
GFR, Est AFR Am: >60 mL/min (ref >60)
GFR, Estimated: >60 mL/min (ref >60)
GLUCOSE: 128 mg/dL — AB (ref 70–99)
POTASSIUM: 4 mmol/L (ref 3.5–5.1)
SODIUM: 138 mmol/L (ref 135–145)
Total Bilirubin: 0.3 mg/dL (ref 0.3–1.2)
Total Protein: 6.6 g/dL (ref 6.5–8.1)

## 2017-12-13 LAB — CBC WITH DIFFERENTIAL (CANCER CENTER ONLY)
BASOS PCT: 1 %
Basophils Absolute: 0.1 10*3/uL (ref 0.0–0.1)
Eosinophils Absolute: 0.2 10*3/uL (ref 0.0–0.5)
Eosinophils Relative: 4 %
HEMATOCRIT: 32.7 % — AB (ref 34.8–46.6)
HEMOGLOBIN: 11.1 g/dL — AB (ref 11.6–15.9)
Lymphocytes Relative: 23 %
Lymphs Abs: 1.3 10*3/uL (ref 0.9–3.3)
MCH: 30.8 pg (ref 25.1–34.0)
MCHC: 33.9 g/dL (ref 31.5–36.0)
MCV: 90.8 fL (ref 79.5–101.0)
MONO ABS: 0.3 10*3/uL (ref 0.1–0.9)
MONOS PCT: 5 %
NEUTROS ABS: 3.8 10*3/uL (ref 1.5–6.5)
Neutrophils Relative %: 67 %
Platelet Count: 258 10*3/uL (ref 145–400)
RBC: 3.6 MIL/uL — AB (ref 3.70–5.45)
RDW: 14.2 % (ref 11.2–14.5)
WBC Count: 5.6 10*3/uL (ref 3.9–10.3)

## 2017-12-13 MED ORDER — FAMOTIDINE IN NACL 20-0.9 MG/50ML-% IV SOLN
20.0000 mg | Freq: Once | INTRAVENOUS | Status: AC
  Administered 2017-12-13: 20 mg via INTRAVENOUS

## 2017-12-13 MED ORDER — DIPHENHYDRAMINE HCL 50 MG/ML IJ SOLN
50.0000 mg | Freq: Once | INTRAMUSCULAR | Status: AC
  Administered 2017-12-13: 50 mg via INTRAVENOUS

## 2017-12-13 MED ORDER — DIPHENHYDRAMINE HCL 50 MG/ML IJ SOLN
INTRAMUSCULAR | Status: AC
  Filled 2017-12-13: qty 1

## 2017-12-13 MED ORDER — SODIUM CHLORIDE 0.9 % IV SOLN
80.0000 mg/m2 | Freq: Once | INTRAVENOUS | Status: AC
  Administered 2017-12-13: 168 mg via INTRAVENOUS
  Filled 2017-12-13: qty 28

## 2017-12-13 MED ORDER — SODIUM CHLORIDE 0.9 % IV SOLN
Freq: Once | INTRAVENOUS | Status: AC
  Administered 2017-12-13: 12:00:00 via INTRAVENOUS

## 2017-12-13 MED ORDER — SODIUM CHLORIDE 0.9% FLUSH
10.0000 mL | INTRAVENOUS | Status: AC | PRN
  Administered 2017-12-13: 10 mL
  Filled 2017-12-13: qty 10

## 2017-12-13 MED ORDER — FAMOTIDINE IN NACL 20-0.9 MG/50ML-% IV SOLN
INTRAVENOUS | Status: AC
  Filled 2017-12-13: qty 50

## 2017-12-13 MED ORDER — SODIUM CHLORIDE 0.9 % IV SOLN
20.0000 mg | Freq: Once | INTRAVENOUS | Status: AC
  Administered 2017-12-13: 20 mg via INTRAVENOUS
  Filled 2017-12-13: qty 2

## 2017-12-13 MED ORDER — SODIUM CHLORIDE 0.9% FLUSH
10.0000 mL | INTRAVENOUS | Status: DC | PRN
  Administered 2017-12-13: 10 mL
  Filled 2017-12-13: qty 10

## 2017-12-13 MED ORDER — HEPARIN SOD (PORK) LOCK FLUSH 100 UNIT/ML IV SOLN
500.0000 [IU] | Freq: Once | INTRAVENOUS | Status: AC | PRN
  Administered 2017-12-13: 500 [IU]
  Filled 2017-12-13: qty 5

## 2017-12-13 NOTE — Patient Instructions (Signed)
[facility] Discharge Instructions for Patients Receiving Chemotherapy  Today you received the following chemotherapy agents:  Taxol.  To help prevent nausea and vomiting after your treatment, we encourage you to take your nausea medication as directed.   If you develop nausea and vomiting that is not controlled by your nausea medication, call the clinic.   BELOW ARE SYMPTOMS THAT SHOULD BE REPORTED IMMEDIATELY:  *FEVER GREATER THAN 100.5 F  *CHILLS WITH OR WITHOUT FEVER  NAUSEA AND VOMITING THAT IS NOT CONTROLLED WITH YOUR NAUSEA MEDICATION  *UNUSUAL SHORTNESS OF BREATH  *UNUSUAL BRUISING OR BLEEDING  TENDERNESS IN MOUTH AND THROAT WITH OR WITHOUT PRESENCE OF ULCERS  *URINARY PROBLEMS  *BOWEL PROBLEMS  UNUSUAL RASH Items with * indicate a potential emergency and should be followed up as soon as possible.  Feel free to call the clinic should you have any questions or concerns. The clinic phone number is [phone].  Please show the CHEMO ALERT CARD at check-in to the Emergency Department and triage nurse.   

## 2017-12-13 NOTE — Progress Notes (Signed)
[facility] Follow up:    Sabrina Jordan, MD [address] [address]   DIAGNOSIS: Cancer Staging Malignant neoplasm of upper-inner quadrant of left breast in female, estrogen receptor positive ([place]) Staging form: Breast, AJCC 8th Edition - Clinical: Stage IA (cT1c, cN0, cM0, G2, ER: Positive, PR: Positive, HER2: Negative) - Unsigned   SUMMARY OF ONCOLOGIC HISTORY:   Malignant neoplasm of upper-inner quadrant of left breast in female, estrogen receptor positive ([place])   07/24/2017 Initial Diagnosis    Left breast palpable lump upper inner quadrant 11 o'clock position: Ill-defined 1.6 cm mass, axilla ultrasound negative, ultrasound-guided biopsy: Grade 2 IDC ER 90%, PR 40%, Ki-67 15%, HER-2 negative      08/09/2017 Genetic Testing    Negative genetic testing common hereditary cancer panel.  The Hereditary Gene Panel offered by [facility] includes sequencing and/or deletion duplication testing of the following 47 genes: APC, ATM, AXIN2, BARD1, BMPR1A, BRCA1, BRCA2, BRIP1, CDH1, CDK4, CDKN2A (p14ARF), CDKN2A (p16INK4a), CHEK2, CTNNA1, DICER1, EPCAM (Deletion/duplication testing only), GREM1 (promoter region deletion/duplication testing only), KIT, MEN1, MLH1, MSH2, MSH3, MSH6, MUTYH, NBN, NF1, NHTL1, PALB2, PDGFRA, PMS2, POLD1, POLE, PTEN, RAD50, RAD51C, RAD51D, SDHB, SDHC, SDHD, SMAD4, SMARCA4. STK11, TP53, TSC1, TSC2, and VHL.  The following genes were evaluated for sequence changes only: SDHA and HOXB13 c.251G>A variant only. The report date is August 07, 2017.      08/16/2017 Surgery    Left lumpectomy: IDC grade 2, 2.5 cm, DCIS intermediate grade, lymphovascular invasion identified, margins negative, 1/2 lymph nodes positive with extracapsular extension, ER 90%, PR 40%, HER-2 negative ratio 1.15, Ki-67 15% T2 N1a stage II a; Mammaprint high risk       09/12/2017 -  Chemotherapy    Adjuvant chemotherapy with dose dense Adriamycin and Cytoxan x4  followed by Taxol weekly x12        CURRENT THERAPY: Taxol  INTERVAL HISTORY: [name] 37 y.o. female returns for evaluation prior to receiving Taxol.  She is doing well today.  She notes occasional left eye twitching.  Otherwise she is doing well.  She denies peripheral neuropathy.     Patient Active Problem List   Diagnosis Date Noted  . Port-A-Cath in place 09/12/2017  . Genetic testing 08/09/2017  . Family history of breast cancer   . Family history of colon cancer   . Family history of cancer   . Malignant neoplasm of upper-inner quadrant of left breast in female, estrogen receptor positive ([place]) 07/30/2017  . Previous cesarean section 09/05/2015  . Postpartum care following cesarean delivery with btl (4/8) 09/04/2015  . Symptomatic cholelithiasis 11/20/2011    has No Known Allergies.  MEDICAL HISTORY: Past Medical History:  Diagnosis Date  . Anxiety   . Breast cancer, left ([place]) 07/2017  . Family history of breast cancer   . Family history of colon cancer     SURGICAL HISTORY: Past Surgical History:  Procedure Laterality Date  . BREAST LUMPECTOMY WITH RADIOACTIVE SEED AND SENTINEL LYMPH NODE BIOPSY Left 08/16/2017   Procedure: BREAST LUMPECTOMY WITH RADIOACTIVE SEED AND SENTINEL LYMPH NODE BIOPSY;  Surgeon: Excell Seltzer, MD;  Location: [facility];  Service: General;  Laterality: Left;  . BREAST LUMPECTOMY WITH SENTINEL LYMPH NODE BIOPSY Left 08/16/2017  . BREAST REDUCTION SURGERY Bilateral 08/23/2017   Procedure: LEFT ONCOPLASTIC REDUCTION, RIGHT BREAST REDUCTION;  Surgeon: Irene Limbo, MD;  Location: [facility];  Service: Plastics;  Laterality: Bilateral;  . CESAREAN  SECTION  03/15/2012   Procedure: CESAREAN SECTION;  Surgeon: Lovenia Kim, MD;  Location: [facility];  Service: Obstetrics;  Laterality: N/A;  Primary cesarean section with delivery of baby girl at 60. Apgars 4/7.  Marland Kitchen CESAREAN SECTION WITH BILATERAL  TUBAL LIGATION Bilateral 09/03/2015   Procedure: REPEAT CESAREAN SECTION WITH BILATERAL TUBAL LIGATION;  Surgeon: Brien Few, MD;  Location: [facility];  Service: Obstetrics;  Laterality: Bilateral;  EDD: 09/10/15  . CHOLECYSTECTOMY  12/07/2011   Procedure: LAPAROSCOPIC CHOLECYSTECTOMY;  Surgeon: Harl Bowie, MD;  Location: [facility];  Service: General;  Laterality: N/A;  . PORTACATH PLACEMENT Right 09/11/2017   Procedure: INSERTION PORT-A-CATH;  Surgeon: Excell Seltzer, MD;  Location: [facility];  Service: General;  Laterality: Right;  . WISDOM TOOTH EXTRACTION  2002    SOCIAL HISTORY: Social History   Socioeconomic History  . Marital status: Married    Spouse name: Not on file  . Number of children: Not on file  . Years of education: Not on file  . Highest education level: Not on file  Occupational History  . Not on file  Social Needs  . Financial resource strain: Not on file  . Food insecurity:    Worry: Not on file    Inability: Not on file  . Transportation needs:    Medical: Not on file    Non-medical: Not on file  Tobacco Use  . Smoking status: Former Smoker    Packs/day: 0.00    Last attempt to quit: 05/27/2014    Years since quitting: 3.5  . Smokeless tobacco: Never Used  Substance and Sexual Activity  . Alcohol use: Yes    Comment: rarely  . Drug use: Yes    Types: Marijuana    Comment: reports she occsionally uses THC when vaping   . Sexual activity: Yes  Lifestyle  . Physical activity:    Days per week: Not on file    Minutes per session: Not on file  . Stress: Not on file  Relationships  . Social connections:    Talks on phone: Not on file    Gets together: Not on file    Attends religious service: Not on file    Active member of club or organization: Not on file    Attends meetings of clubs or organizations: Not on file    Relationship status: Not on file  . Intimate partner violence:    Fear of current or ex partner: Not on file    Emotionally  abused: Not on file    Physically abused: Not on file    Forced sexual activity: Not on file  Other Topics Concern  . Not on file  Social History Narrative  . Not on file    FAMILY HISTORY: Family History  Problem Relation Age of Onset  . Diabetes Mother   . Cancer Mother 53       chondrosarcoma  . Breast cancer Mother 23       DCIS vs Atypical Hyperplasia  . Breast cancer Other        Maternal Great Aunt; MGMs sister  . Breast cancer Other        Maternal Great Aunt  . Other Maternal Grandmother        d. from complications of a fall and breaking her hip  . Alcohol abuse Maternal Grandfather   . Colon cancer Paternal Grandmother     Review of Systems  Constitutional: Negative for appetite change, chills, fatigue, fever and  unexpected weight change.  HENT:   Negative for hearing loss, lump/mass, sore throat and trouble swallowing.   Eyes: Negative for eye problems and icterus.  Respiratory: Negative for chest tightness, cough and shortness of breath.   Cardiovascular: Negative for chest pain, leg swelling and palpitations.  Gastrointestinal: Negative for abdominal distention, abdominal pain, constipation, diarrhea, nausea and vomiting.  Endocrine: Negative for hot flashes.  Musculoskeletal: Negative for arthralgias.  Neurological: Negative for dizziness, extremity weakness, headaches and numbness.  Hematological: Negative for adenopathy. Does not bruise/bleed easily.  Psychiatric/Behavioral: Negative for depression. The patient is not nervous/anxious.       PHYSICAL EXAMINATION  ECOG PERFORMANCE STATUS: 1 - Symptomatic but completely ambulatory  Vitals:   12/13/17 1135  BP: 121/76  Pulse: 97  Resp: 20  Temp: 99 F (37.2 C)  SpO2: 98%    Physical Exam  Constitutional: She is oriented to person, place, and time. She appears well-developed and well-nourished.  HENT:  Head: Normocephalic and atraumatic.  Mouth/Throat: Oropharynx is clear and moist. No  oropharyngeal exudate.  Eyes: Pupils are equal, round, and reactive to light. No scleral icterus.  Neck: Neck supple.  Cardiovascular: Normal rate, regular rhythm and normal heart sounds.  Pulmonary/Chest: Effort normal and breath sounds normal.  Abdominal: Soft. Bowel sounds are normal.  Musculoskeletal: She exhibits no edema.  Lymphadenopathy:    She has no cervical adenopathy.  Neurological: She is alert and oriented to person, place, and time.  Skin: Skin is warm and dry. Capillary refill takes less than 2 seconds.  Psychiatric: She has a normal mood and affect.    LABORATORY DATA:  CBC    Component Value Date/Time   WBC 5.6 12/13/2017 1101   WBC 7.2 09/09/2017 0953   RBC 3.60 (L) 12/13/2017 1101   HGB 11.1 (L) 12/13/2017 1101   HCT 32.7 (L) 12/13/2017 1101   PLT 258 12/13/2017 1101   MCV 90.8 12/13/2017 1101   MCH 30.8 12/13/2017 1101   MCHC 33.9 12/13/2017 1101   RDW 14.2 12/13/2017 1101   LYMPHSABS 1.3 12/13/2017 1101   MONOABS 0.3 12/13/2017 1101   EOSABS 0.2 12/13/2017 1101   BASOSABS 0.1 12/13/2017 1101    CMP     Component Value Date/Time   NA 138 12/13/2017 1101   K 4.0 12/13/2017 1101   CL 106 12/13/2017 1101   CO2 25 12/13/2017 1101   GLUCOSE 128 (H) 12/13/2017 1101   BUN 15 12/13/2017 1101   CREATININE 0.79 12/13/2017 1101   CALCIUM 9.1 12/13/2017 1101   PROT 6.6 12/13/2017 1101   ALBUMIN 3.7 12/13/2017 1101   AST 24 12/13/2017 1101   ALT 41 12/13/2017 1101   ALKPHOS 88 12/13/2017 1101   BILITOT 0.3 12/13/2017 1101   GFRNONAA >60 12/13/2017 1101   GFRAA >60 12/13/2017 1101     ASSESSMENT and PLAN:   Malignant neoplasm of upper-inner quadrant of left breast in female, estrogen receptor positive ([facility]) 08/19/2017:Left lumpectomy: IDC grade 2, 2.5 cm, DCIS intermediate grade, lymphovascular invasion identified, margins negative, 1/2 lymph nodes positive with extracapsular extension, ER 90%, PR 40%, HER-2 negative ratio 1.15, Ki-67 15% T2 N1a stage  II a; Mammaprint high risk   Pathology counseling: I discussed the final pathology report of the patient provided a copy of this report. I discussed the margins as well as lymph node surgeries. We also discussed the final staging along with previously performed ER/PR and HER-2/neu testing.  Recommendation: 1. Adjuvant chemotherapy with dose dense Adriamycin and Cytoxan  x4 followed by Taxol weekly x12, echocardiogram 09/04/17 was normal with EF of 55-60%, echo 10/29/17 normal with EF 60-65% 2. followed by adjuvant radiation therapy 3. Followed by adjuvant antiestrogen therapy  _______________________________________________________________________________________________  Current treatment: Taxol Taxol toxicities: Doing well.  Encouraged rest and reducing stress to help with twitching.  She is tolerating treatment well, and will proceed with it today.         All questions were answered. The patient knows to call the clinic with any problems, questions or concerns. We can certainly see the patient much sooner if necessary.  A total of (20) minutes of face-to-face time was spent with this patient with greater than 50% of that time in counseling and care-coordination.  This note was electronically signed. Scot Dock, NP 12/13/2017

## 2017-12-13 NOTE — Assessment & Plan Note (Signed)
08/19/2017:Left lumpectomy: IDC grade 2, 2.5 cm, DCIS intermediate grade, lymphovascular invasion identified, margins negative, 1/2 lymph nodes positive with extracapsular extension, ER 90%, PR 40%, HER-2 negative ratio 1.15, Ki-67 15% T2 N1a stage II a; Mammaprint high risk   Pathology counseling: I discussed the final pathology report of the patient provided a copy of this report. I discussed the margins as well as lymph node surgeries. We also discussed the final staging along with previously performed ER/PR and HER-2/neu testing.  Recommendation: 1. Adjuvant chemotherapy with dose dense Adriamycin and Cytoxan x4 followed by Taxol weekly x12, echocardiogram 09/04/17 was normal with EF of 55-60%, echo 10/29/17 normal with EF 60-65% 2. followed by adjuvant radiation therapy 3. Followed by adjuvant antiestrogen therapy  _______________________________________________________________________________________________  Current treatment: Taxol Taxol toxicities: Doing well.  Encouraged rest and reducing stress to help with twitching.  She is tolerating treatment well, and will proceed with it today.

## 2017-12-13 NOTE — Telephone Encounter (Signed)
Per 7/19 no los

## 2017-12-20 ENCOUNTER — Other Ambulatory Visit: Payer: 59

## 2017-12-20 ENCOUNTER — Inpatient Hospital Stay: Payer: BLUE CROSS/BLUE SHIELD

## 2017-12-20 ENCOUNTER — Ambulatory Visit: Payer: 59

## 2017-12-20 DIAGNOSIS — Z5111 Encounter for antineoplastic chemotherapy: Secondary | ICD-10-CM | POA: Diagnosis not present

## 2017-12-20 DIAGNOSIS — Z170 Estrogen receptor positive status [ER+]: Principal | ICD-10-CM

## 2017-12-20 DIAGNOSIS — Z95828 Presence of other vascular implants and grafts: Secondary | ICD-10-CM

## 2017-12-20 DIAGNOSIS — C50212 Malignant neoplasm of upper-inner quadrant of left female breast: Secondary | ICD-10-CM

## 2017-12-20 DIAGNOSIS — Z79899 Other long term (current) drug therapy: Secondary | ICD-10-CM | POA: Diagnosis not present

## 2017-12-20 LAB — CBC WITH DIFFERENTIAL (CANCER CENTER ONLY)
BASOS PCT: 1 %
Basophils Absolute: 0.1 10*3/uL (ref 0.0–0.1)
EOS ABS: 0.1 10*3/uL (ref 0.0–0.5)
Eosinophils Relative: 3 %
HCT: 32.1 % — ABNORMAL LOW (ref 34.8–46.6)
HEMOGLOBIN: 10.8 g/dL — AB (ref 11.6–15.9)
LYMPHS ABS: 1.1 10*3/uL (ref 0.9–3.3)
Lymphocytes Relative: 25 %
MCH: 30.8 pg (ref 25.1–34.0)
MCHC: 33.6 g/dL (ref 31.5–36.0)
MCV: 91.5 fL (ref 79.5–101.0)
MONO ABS: 0.2 10*3/uL (ref 0.1–0.9)
MONOS PCT: 5 %
Neutro Abs: 2.9 10*3/uL (ref 1.5–6.5)
Neutrophils Relative %: 66 %
Platelet Count: 205 10*3/uL (ref 145–400)
RBC: 3.51 MIL/uL — ABNORMAL LOW (ref 3.70–5.45)
RDW: 14.5 % (ref 11.2–14.5)
WBC Count: 4.4 10*3/uL (ref 3.9–10.3)

## 2017-12-20 LAB — CMP (CANCER CENTER ONLY)
ALBUMIN: 3.7 g/dL (ref 3.5–5.0)
ALK PHOS: 81 U/L (ref 38–126)
ALT: 42 U/L (ref 0–44)
AST: 26 U/L (ref 15–41)
Anion gap: 6 (ref 5–15)
BUN: 13 mg/dL (ref 6–20)
CALCIUM: 8.6 mg/dL — AB (ref 8.9–10.3)
CHLORIDE: 108 mmol/L (ref 98–111)
CO2: 26 mmol/L (ref 22–32)
CREATININE: 0.75 mg/dL (ref 0.44–1.00)
GFR, Estimated: >60 mL/min (ref >60)
GLUCOSE: 98 mg/dL (ref 70–99)
Potassium: 4.2 mmol/L (ref 3.5–5.1)
Sodium: 140 mmol/L (ref 135–145)
Total Bilirubin: 0.3 mg/dL (ref 0.3–1.2)
Total Protein: 6.3 g/dL — ABNORMAL LOW (ref 6.5–8.1)

## 2017-12-20 MED ORDER — PACLITAXEL CHEMO INJECTION 300 MG/50ML
80.0000 mg/m2 | Freq: Once | INTRAVENOUS | Status: DC
  Filled 2017-12-20: qty 28

## 2017-12-20 MED ORDER — FAMOTIDINE IN NACL 20-0.9 MG/50ML-% IV SOLN
INTRAVENOUS | Status: AC
  Filled 2017-12-20: qty 50

## 2017-12-20 MED ORDER — SODIUM CHLORIDE 0.9 % IV SOLN
Freq: Once | INTRAVENOUS | Status: AC
  Administered 2017-12-20: 10:00:00 via INTRAVENOUS
  Filled 2017-12-20: qty 250

## 2017-12-20 MED ORDER — SODIUM CHLORIDE 0.9% FLUSH
10.0000 mL | INTRAVENOUS | Status: DC | PRN
  Administered 2017-12-20: 10 mL
  Filled 2017-12-20: qty 10

## 2017-12-20 MED ORDER — HEPARIN SOD (PORK) LOCK FLUSH 100 UNIT/ML IV SOLN
500.0000 [IU] | Freq: Once | INTRAVENOUS | Status: AC | PRN
  Administered 2017-12-20: 500 [IU]
  Filled 2017-12-20: qty 5

## 2017-12-20 MED ORDER — DIPHENHYDRAMINE HCL 50 MG/ML IJ SOLN
50.0000 mg | Freq: Once | INTRAMUSCULAR | Status: AC
  Administered 2017-12-20: 50 mg via INTRAVENOUS

## 2017-12-20 MED ORDER — DIPHENHYDRAMINE HCL 50 MG/ML IJ SOLN
INTRAMUSCULAR | Status: AC
  Filled 2017-12-20: qty 1

## 2017-12-20 MED ORDER — SODIUM CHLORIDE 0.9 % IV SOLN
20.0000 mg | Freq: Once | INTRAVENOUS | Status: AC
  Administered 2017-12-20: 20 mg via INTRAVENOUS
  Filled 2017-12-20: qty 2

## 2017-12-20 MED ORDER — SODIUM CHLORIDE 0.9 % IV SOLN
168.0000 mg | Freq: Once | INTRAVENOUS | Status: AC
  Administered 2017-12-20: 168 mg via INTRAVENOUS
  Filled 2017-12-20: qty 28

## 2017-12-20 MED ORDER — FAMOTIDINE IN NACL 20-0.9 MG/50ML-% IV SOLN
20.0000 mg | Freq: Once | INTRAVENOUS | Status: AC
  Administered 2017-12-20: 20 mg via INTRAVENOUS

## 2017-12-20 NOTE — Patient Instructions (Signed)
[facility] Discharge Instructions for Patients Receiving Chemotherapy  Today you received the following chemotherapy agents:  Taxol.  To help prevent nausea and vomiting after your treatment, we encourage you to take your nausea medication as directed.   If you develop nausea and vomiting that is not controlled by your nausea medication, call the clinic.   BELOW ARE SYMPTOMS THAT SHOULD BE REPORTED IMMEDIATELY:  *FEVER GREATER THAN 100.5 F  *CHILLS WITH OR WITHOUT FEVER  NAUSEA AND VOMITING THAT IS NOT CONTROLLED WITH YOUR NAUSEA MEDICATION  *UNUSUAL SHORTNESS OF BREATH  *UNUSUAL BRUISING OR BLEEDING  TENDERNESS IN MOUTH AND THROAT WITH OR WITHOUT PRESENCE OF ULCERS  *URINARY PROBLEMS  *BOWEL PROBLEMS  UNUSUAL RASH Items with * indicate a potential emergency and should be followed up as soon as possible.  Feel free to call the clinic should you have any questions or concerns. The clinic phone number is [phone].  Please show the CHEMO ALERT CARD at check-in to the Emergency Department and triage nurse.   

## 2017-12-27 ENCOUNTER — Inpatient Hospital Stay: Payer: BLUE CROSS/BLUE SHIELD | Attending: Hematology and Oncology | Admitting: Hematology and Oncology

## 2017-12-27 ENCOUNTER — Telehealth: Payer: Self-pay | Admitting: Hematology and Oncology

## 2017-12-27 ENCOUNTER — Inpatient Hospital Stay: Payer: BLUE CROSS/BLUE SHIELD

## 2017-12-27 ENCOUNTER — Encounter: Payer: Self-pay | Admitting: *Deleted

## 2017-12-27 DIAGNOSIS — G629 Polyneuropathy, unspecified: Secondary | ICD-10-CM | POA: Diagnosis not present

## 2017-12-27 DIAGNOSIS — Z170 Estrogen receptor positive status [ER+]: Secondary | ICD-10-CM | POA: Diagnosis not present

## 2017-12-27 DIAGNOSIS — Z79899 Other long term (current) drug therapy: Secondary | ICD-10-CM | POA: Diagnosis not present

## 2017-12-27 DIAGNOSIS — Z95828 Presence of other vascular implants and grafts: Secondary | ICD-10-CM

## 2017-12-27 DIAGNOSIS — C50212 Malignant neoplasm of upper-inner quadrant of left female breast: Secondary | ICD-10-CM | POA: Insufficient documentation

## 2017-12-27 DIAGNOSIS — Z5111 Encounter for antineoplastic chemotherapy: Secondary | ICD-10-CM | POA: Insufficient documentation

## 2017-12-27 DIAGNOSIS — R5383 Other fatigue: Secondary | ICD-10-CM | POA: Insufficient documentation

## 2017-12-27 LAB — CBC WITH DIFFERENTIAL (CANCER CENTER ONLY)
BASOS PCT: 1 %
Basophils Absolute: 0.1 10*3/uL (ref 0.0–0.1)
EOS ABS: 0.1 10*3/uL (ref 0.0–0.5)
EOS PCT: 2 %
HCT: 32.3 % — ABNORMAL LOW (ref 34.8–46.6)
HEMOGLOBIN: 11.2 g/dL — AB (ref 11.6–15.9)
Lymphocytes Relative: 23 %
Lymphs Abs: 1 10*3/uL (ref 0.9–3.3)
MCH: 31.6 pg (ref 25.1–34.0)
MCHC: 34.7 g/dL (ref 31.5–36.0)
MCV: 90.8 fL (ref 79.5–101.0)
Monocytes Absolute: 0.2 10*3/uL (ref 0.1–0.9)
Monocytes Relative: 4 %
NEUTROS PCT: 70 %
Neutro Abs: 3.2 10*3/uL (ref 1.5–6.5)
PLATELETS: 304 10*3/uL (ref 145–400)
RBC: 3.56 MIL/uL — AB (ref 3.70–5.45)
RDW: 15.7 % — ABNORMAL HIGH (ref 11.2–14.5)
WBC: 4.6 10*3/uL (ref 3.9–10.3)

## 2017-12-27 LAB — CMP (CANCER CENTER ONLY)
ALK PHOS: 87 U/L (ref 38–126)
ALT: 35 U/L (ref 0–44)
ANION GAP: 9 (ref 5–15)
AST: 22 U/L (ref 15–41)
Albumin: 3.6 g/dL (ref 3.5–5.0)
BILIRUBIN TOTAL: 0.4 mg/dL (ref 0.3–1.2)
BUN: 12 mg/dL (ref 6–20)
CALCIUM: 8.4 mg/dL — AB (ref 8.9–10.3)
CO2: 24 mmol/L (ref 22–32)
CREATININE: 0.79 mg/dL (ref 0.44–1.00)
Chloride: 106 mmol/L (ref 98–111)
GFR, Estimated: >60 mL/min (ref >60)
Glucose, Bld: 154 mg/dL — ABNORMAL HIGH (ref 70–99)
Potassium: 4 mmol/L (ref 3.5–5.1)
Sodium: 139 mmol/L (ref 135–145)
TOTAL PROTEIN: 6.4 g/dL — AB (ref 6.5–8.1)

## 2017-12-27 MED ORDER — FAMOTIDINE IN NACL 20-0.9 MG/50ML-% IV SOLN
INTRAVENOUS | Status: AC
  Filled 2017-12-27: qty 50

## 2017-12-27 MED ORDER — DIPHENHYDRAMINE HCL 50 MG/ML IJ SOLN
INTRAMUSCULAR | Status: AC
  Filled 2017-12-27: qty 1

## 2017-12-27 MED ORDER — SODIUM CHLORIDE 0.9% FLUSH
10.0000 mL | INTRAVENOUS | Status: DC | PRN
  Administered 2017-12-27: 10 mL
  Filled 2017-12-27: qty 10

## 2017-12-27 MED ORDER — PACLITAXEL CHEMO INJECTION 300 MG/50ML: 80.0000 mg/m2 | Freq: Once | INTRAVENOUS | Status: DC

## 2017-12-27 MED ORDER — SODIUM CHLORIDE 0.9 % IV SOLN
20.0000 mg | Freq: Once | INTRAVENOUS | Status: AC
  Administered 2017-12-27: 20 mg via INTRAVENOUS
  Filled 2017-12-27: qty 2

## 2017-12-27 MED ORDER — SODIUM CHLORIDE 0.9 % IV SOLN
Freq: Once | INTRAVENOUS | Status: AC
  Administered 2017-12-27: 11:00:00 via INTRAVENOUS
  Filled 2017-12-27: qty 250

## 2017-12-27 MED ORDER — PACLITAXEL CHEMO INJECTION 300 MG/50ML
80.0000 mg/m2 | Freq: Once | INTRAVENOUS | Status: AC
  Administered 2017-12-27: 168 mg via INTRAVENOUS
  Filled 2017-12-27: qty 28

## 2017-12-27 MED ORDER — DIPHENHYDRAMINE HCL 50 MG/ML IJ SOLN
50.0000 mg | Freq: Once | INTRAMUSCULAR | Status: AC
  Administered 2017-12-27: 50 mg via INTRAVENOUS

## 2017-12-27 MED ORDER — FAMOTIDINE IN NACL 20-0.9 MG/50ML-% IV SOLN
20.0000 mg | Freq: Once | INTRAVENOUS | Status: AC
  Administered 2017-12-27: 20 mg via INTRAVENOUS

## 2017-12-27 MED ORDER — HEPARIN SOD (PORK) LOCK FLUSH 100 UNIT/ML IV SOLN
500.0000 [IU] | Freq: Once | INTRAVENOUS | Status: AC | PRN
  Administered 2017-12-27: 500 [IU]
  Filled 2017-12-27: qty 5

## 2017-12-27 NOTE — Assessment & Plan Note (Signed)
08/19/2017:Left lumpectomy: IDC grade 2, 2.5 cm, DCIS intermediate grade, lymphovascular invasion identified, margins negative, 1/2 lymph nodes positive with extracapsular extension, ER 90%, PR 40%, HER-2 negative ratio 1.15, Ki-67 15% T2 N1a stage II a; Mammaprint high risk   Pathology counseling: I discussed the final pathology report of the patient provided a copy of this report. I discussed the margins as well as lymph node surgeries. We also discussed the final staging along with previously performed ER/PR and HER-2/neu testing.  Recommendation: 1. Adjuvant chemotherapy with dose dense Adriamycin and Cytoxan x4 followed by Taxol weekly x12, echocardiogram 09/04/17 was normal with EF of 55-60%, echo 10/29/17 normal with EF 60-65% 2. followed by adjuvant radiation therapy 3. Followed by adjuvant antiestrogen therapy  _______________________________________________________________________________________________  Current treatment: Cycle7weekly Taxol Taxol toxicities: Patient is using ice bags to prevent neuropathy. Denies any nausea vomiting.  Monitoring closely for toxicities Labs reviewed  Return weekly for Taxol and every 2 weeks for follow-up with me

## 2017-12-27 NOTE — Progress Notes (Signed)
Patient Care Team: Jonathon Jordan, MD as PCP - General (Family Medicine)  DIAGNOSIS:  Encounter Diagnosis  Name Primary?  . Malignant neoplasm of upper-inner quadrant of left breast in female, estrogen receptor positive ([place])     SUMMARY OF ONCOLOGIC HISTORY:   Malignant neoplasm of upper-inner quadrant of left breast in female, estrogen receptor positive ([place])   07/24/2017 Initial Diagnosis    Left breast palpable lump upper inner quadrant 11 o'clock position: Ill-defined 1.6 cm mass, axilla ultrasound negative, ultrasound-guided biopsy: Grade 2 IDC ER 90%, PR 40%, Ki-67 15%, HER-2 negative      08/09/2017 Genetic Testing    Negative genetic testing common hereditary cancer panel.  The Hereditary Gene Panel offered by [facility] includes sequencing and/or deletion duplication testing of the following 47 genes: APC, ATM, AXIN2, BARD1, BMPR1A, BRCA1, BRCA2, BRIP1, CDH1, CDK4, CDKN2A (p14ARF), CDKN2A (p16INK4a), CHEK2, CTNNA1, DICER1, EPCAM (Deletion/duplication testing only), GREM1 (promoter region deletion/duplication testing only), KIT, MEN1, MLH1, MSH2, MSH3, MSH6, MUTYH, NBN, NF1, NHTL1, PALB2, PDGFRA, PMS2, POLD1, POLE, PTEN, RAD50, RAD51C, RAD51D, SDHB, SDHC, SDHD, SMAD4, SMARCA4. STK11, TP53, TSC1, TSC2, and VHL.  The following genes were evaluated for sequence changes only: SDHA and HOXB13 c.251G>A variant only. The report date is August 07, 2017.      08/16/2017 Surgery    Left lumpectomy: IDC grade 2, 2.5 cm, DCIS intermediate grade, lymphovascular invasion identified, margins negative, 1/2 lymph nodes positive with extracapsular extension, ER 90%, PR 40%, HER-2 negative ratio 1.15, Ki-67 15% T2 N1a stage II a; Mammaprint high risk       09/12/2017 -  Chemotherapy    Adjuvant chemotherapy with dose dense Adriamycin and Cytoxan x4 followed by Taxol weekly x12        CHIEF COMPLIANT: Cycle 7 Taxol  INTERVAL HISTORY: [name] is a 37 year old with above-mentioned history  of left breast cancer treated with lumpectomy and is currently on adjuvant chemotherapy and today is cycle 7 of Taxol.  She had mild neuropathy after the last cycle but it went away.  Denies any other complaints.  She is anxious to finish her treatment and get the port out.  She is also thinking about having her ovaries removed.  She would like to do it between last chemo and radiation.  REVIEW OF SYSTEMS:   Constitutional: Denies fevers, chills or abnormal weight loss Eyes: Denies blurriness of vision Ears, nose, mouth, throat, and face: Denies mucositis or sore throat Respiratory: Denies cough, dyspnea or wheezes Cardiovascular: Denies palpitation, chest discomfort Gastrointestinal:  Denies nausea, heartburn or change in bowel habits Skin: Denies abnormal skin rashes Lymphatics: Denies new lymphadenopathy or easy bruising Neurological: Mild peripheral neuropathy Behavioral/Psych: Mood is stable, no new changes  Extremities: No lower extremity edema  All other systems were reviewed with the patient and are negative.  I have reviewed the past medical history, past surgical history, social history and family history with the patient and they are unchanged from previous note.  ALLERGIES:  has No Known Allergies.  MEDICATIONS:  Current Outpatient Medications  Medication Sig Dispense Refill  . ALPRAZolam (XANAX) 0.25 MG tablet Take 0.25 mg by mouth daily as needed for anxiety.  2  . escitalopram (LEXAPRO) 20 MG tablet Take 20 mg by mouth daily with supper.     Marland Kitchen ibuprofen (ADVIL,MOTRIN) 200 MG tablet Take 200-800 mg by mouth every 8 (eight) hours as needed (for pain.).     Marland Kitchen lidocaine-prilocaine (EMLA) cream Apply to affected area once 30 g 3  .  LORazepam (ATIVAN) 0.5 MG tablet Take 1 tablet (0.5 mg total) by mouth at bedtime as needed (Nausea or vomiting). 30 tablet 0  . ondansetron (ZOFRAN) 8 MG tablet Take 1 tablet (8 mg total) by mouth 2 (two) times daily as needed. Start on the third  day after chemotherapy. 30 tablet 1  . prochlorperazine (COMPAZINE) 10 MG tablet Take 1 tablet (10 mg total) by mouth every 6 (six) hours as needed (Nausea or vomiting). 30 tablet 1  . valACYclovir (VALTREX) 500 MG tablet Take 1 tablet (500 mg total) by mouth 2 (two) times daily. 60 tablet 6   No current facility-administered medications for this visit.    Facility-Administered Medications Ordered in Other Visits  Medication Dose Route Frequency Provider Last Rate Last Dose  . dexamethasone (DECADRON) 20 mg in sodium chloride 0.9 % 50 mL IVPB  20 mg Intravenous Once , , MD 208 mL/hr at 12/27/17 1129 20 mg at 12/27/17 1129  . heparin lock flush 100 unit/mL  500 Units Intracatheter Once PRN , , MD      . PACLitaxel (TAXOL) 168 mg in sodium chloride 0.9 % 250 mL chemo infusion (</= 80mg/m2)  80 mg/m2 (Treatment Plan Recorded) Intravenous Once , , MD      . sodium chloride flush (NS) 0.9 % injection 10 mL  10 mL Intracatheter PRN , , MD   10 mL at 11/15/17 1449  . sodium chloride flush (NS) 0.9 % injection 10 mL  10 mL Intracatheter PRN , , MD      . sodium chloride flush (NS) 0.9 % injection 10 mL  10 mL Intracatheter PRN , , MD   10 mL at 12/13/17 1500    PHYSICAL EXAMINATION: ECOG PERFORMANCE STATUS: 1 - Symptomatic but completely ambulatory  Vitals:   12/27/17 1013  BP: 133/74  Pulse: 92  Resp: 17  Temp: 98.6 F (37 C)  SpO2: 99%   Filed Weights   12/27/17 1013  Weight: 244 lb (110.7 kg)    GENERAL:alert, no distress and comfortable SKIN: skin color, texture, turgor are normal, no rashes or significant lesions EYES: normal, Conjunctiva are pink and non-injected, sclera clear OROPHARYNX:no exudate, no erythema and lips, buccal mucosa, and tongue normal  NECK: supple, thyroid normal size, non-tender, without nodularity LYMPH:  no palpable lymphadenopathy in the cervical, axillary or inguinal LUNGS: clear to  auscultation and percussion with normal breathing effort HEART: regular rate & rhythm and no murmurs and no lower extremity edema ABDOMEN:abdomen soft, non-tender and normal bowel sounds MUSCULOSKELETAL:no cyanosis of digits and no clubbing  NEURO: alert & oriented x 3 with fluent speech, no focal motor/sensory deficits EXTREMITIES: No lower extremity edema    LABORATORY DATA:  I have reviewed the data as listed CMP Latest Ref Rng & Units 12/27/2017 12/20/2017 12/13/2017  Glucose 70 - 99 mg/dL 154(H) 98 128(H)  BUN 6 - 20 mg/dL 12 13 15  Creatinine 0.44 - 1.00 mg/dL 0.79 0.75 0.79  Sodium 135 - 145 mmol/L 139 140 138  Potassium 3.5 - 5.1 mmol/L 4.0 4.2 4.0  Chloride 98 - 111 mmol/L 106 108 106  CO2 22 - 32 mmol/L 24 26 25  Calcium 8.9 - 10.3 mg/dL 8.4(L) 8.6(L) 9.1  Total Protein 6.5 - 8.1 g/dL 6.4(L) 6.3(L) 6.6  Total Bilirubin 0.3 - 1.2 mg/dL 0.4 0.3 0.3  Alkaline Phos 38 - 126 U/L 87 81 88  AST 15 - 41 U/L 22 26 24  ALT 0 - 44 U/L 35   42 41    Lab Results  Component Value Date   WBC 4.6 12/27/2017   HGB 11.2 (L) 12/27/2017   HCT 32.3 (L) 12/27/2017   MCV 90.8 12/27/2017   PLT 304 12/27/2017   NEUTROABS 3.2 12/27/2017    ASSESSMENT & PLAN:  Malignant neoplasm of upper-inner quadrant of left breast in female, estrogen receptor positive ([place]) 08/19/2017:Left lumpectomy: IDC grade 2, 2.5 cm, DCIS intermediate grade, lymphovascular invasion identified, margins negative, 1/2 lymph nodes positive with extracapsular extension, ER 90%, PR 40%, HER-2 negative ratio 1.15, Ki-67 15% T2 N1a stage II a; Mammaprint high risk   Pathology counseling: I discussed the final pathology report of the patient provided a copy of this report. I discussed the margins as well as lymph node surgeries. We also discussed the final staging along with previously performed ER/PR and HER-2/neu testing.  Recommendation: 1. Adjuvant chemotherapy with dose dense Adriamycin and Cytoxan x4 followed by Taxol  weekly x12, echocardiogram 09/04/17 was normal with EF of 55-60%, echo 10/29/17 normal with EF 60-65% 2. followed by adjuvant radiation therapy 3. Followed by adjuvant antiestrogen therapy  _______________________________________________________________________________________________  Current treatment: Cycle7weekly Taxol Taxol toxicities: Patient is using ice bags to prevent neuropathy. Denies any nausea vomiting.  Monitoring closely for toxicities Labs reviewed She wants to get the port out as soon as she finishes with chemo. She may also consider having oophorectomy between chemo and radiation. I discussed with her briefly about Natalee clinical trial.  She appears to be interested.  Return weekly for Taxol and every 2 weeks for follow-up with me  No orders of the defined types were placed in this encounter.  The patient has a good understanding of the overall plan. she agrees with it. she will call with any problems that may develop before the next visit here.   Harriette Ohara, MD 12/27/17

## 2017-12-27 NOTE — Patient Instructions (Signed)
[facility] Discharge Instructions for Patients Receiving Chemotherapy  Today you received the following chemotherapy agents:  Taxol.  To help prevent nausea and vomiting after your treatment, we encourage you to take your nausea medication as directed.   If you develop nausea and vomiting that is not controlled by your nausea medication, call the clinic.   BELOW ARE SYMPTOMS THAT SHOULD BE REPORTED IMMEDIATELY:  *FEVER GREATER THAN 100.5 F  *CHILLS WITH OR WITHOUT FEVER  NAUSEA AND VOMITING THAT IS NOT CONTROLLED WITH YOUR NAUSEA MEDICATION  *UNUSUAL SHORTNESS OF BREATH  *UNUSUAL BRUISING OR BLEEDING  TENDERNESS IN MOUTH AND THROAT WITH OR WITHOUT PRESENCE OF ULCERS  *URINARY PROBLEMS  *BOWEL PROBLEMS  UNUSUAL RASH Items with * indicate a potential emergency and should be followed up as soon as possible.  Feel free to call the clinic should you have any questions or concerns. The clinic phone number is [phone].  Please show the CHEMO ALERT CARD at check-in to the Emergency Department and triage nurse.   

## 2017-12-27 NOTE — Telephone Encounter (Signed)
No LOS 8/2

## 2018-01-02 ENCOUNTER — Encounter: Payer: Self-pay | Admitting: Physical Therapy

## 2018-01-03 ENCOUNTER — Ambulatory Visit: Payer: 59

## 2018-01-03 ENCOUNTER — Other Ambulatory Visit: Payer: 59

## 2018-01-03 ENCOUNTER — Inpatient Hospital Stay: Payer: BLUE CROSS/BLUE SHIELD

## 2018-01-03 ENCOUNTER — Other Ambulatory Visit: Payer: Self-pay | Admitting: Hematology and Oncology

## 2018-01-03 VITALS — BP 130/82 | HR 83 | Temp 97.8°F | Resp 16

## 2018-01-03 DIAGNOSIS — R5383 Other fatigue: Secondary | ICD-10-CM | POA: Diagnosis not present

## 2018-01-03 DIAGNOSIS — C50212 Malignant neoplasm of upper-inner quadrant of left female breast: Secondary | ICD-10-CM

## 2018-01-03 DIAGNOSIS — Z79899 Other long term (current) drug therapy: Secondary | ICD-10-CM | POA: Diagnosis not present

## 2018-01-03 DIAGNOSIS — G629 Polyneuropathy, unspecified: Secondary | ICD-10-CM | POA: Diagnosis not present

## 2018-01-03 DIAGNOSIS — Z170 Estrogen receptor positive status [ER+]: Secondary | ICD-10-CM | POA: Diagnosis not present

## 2018-01-03 DIAGNOSIS — Z5111 Encounter for antineoplastic chemotherapy: Secondary | ICD-10-CM | POA: Diagnosis not present

## 2018-01-03 DIAGNOSIS — Z95828 Presence of other vascular implants and grafts: Secondary | ICD-10-CM

## 2018-01-03 LAB — CBC WITH DIFFERENTIAL (CANCER CENTER ONLY)
BASOS ABS: 0 10*3/uL (ref 0.0–0.1)
Basophils Relative: 1 %
EOS ABS: 0.1 10*3/uL (ref 0.0–0.5)
Eosinophils Relative: 2 %
HCT: 31.5 % — ABNORMAL LOW (ref 34.8–46.6)
HEMOGLOBIN: 10.8 g/dL — AB (ref 11.6–15.9)
LYMPHS ABS: 1.1 10*3/uL (ref 0.9–3.3)
Lymphocytes Relative: 25 %
MCH: 31.6 pg (ref 25.1–34.0)
MCHC: 34.2 g/dL (ref 31.5–36.0)
MCV: 92.2 fL (ref 79.5–101.0)
Monocytes Absolute: 0.2 10*3/uL (ref 0.1–0.9)
Monocytes Relative: 5 %
NEUTROS PCT: 67 %
Neutro Abs: 2.8 10*3/uL (ref 1.5–6.5)
Platelet Count: 261 10*3/uL (ref 145–400)
RBC: 3.41 MIL/uL — AB (ref 3.70–5.45)
RDW: 15.2 % — ABNORMAL HIGH (ref 11.2–14.5)
WBC Count: 4.2 10*3/uL (ref 3.9–10.3)

## 2018-01-03 LAB — CMP (CANCER CENTER ONLY)
ALBUMIN: 3.4 g/dL — AB (ref 3.5–5.0)
ALT: 38 U/L (ref 0–44)
AST: 23 U/L (ref 15–41)
Alkaline Phosphatase: 92 U/L (ref 38–126)
Anion gap: 8 (ref 5–15)
BUN: 16 mg/dL (ref 6–20)
CO2: 23 mmol/L (ref 22–32)
CREATININE: 0.77 mg/dL (ref 0.44–1.00)
Calcium: 8 mg/dL — ABNORMAL LOW (ref 8.9–10.3)
Chloride: 108 mmol/L (ref 98–111)
GFR, Est AFR Am: >60 mL/min (ref >60)
GFR, Estimated: >60 mL/min (ref >60)
GLUCOSE: 137 mg/dL — AB (ref 70–99)
Potassium: 4.2 mmol/L (ref 3.5–5.1)
SODIUM: 139 mmol/L (ref 135–145)
Total Bilirubin: 0.3 mg/dL (ref 0.3–1.2)
Total Protein: 6.1 g/dL — ABNORMAL LOW (ref 6.5–8.1)

## 2018-01-03 MED ORDER — SODIUM CHLORIDE 0.9% FLUSH
10.0000 mL | INTRAVENOUS | Status: DC | PRN
  Administered 2018-01-03: 10 mL
  Filled 2018-01-03: qty 10

## 2018-01-03 MED ORDER — DEXAMETHASONE SODIUM PHOSPHATE 100 MG/10ML IJ SOLN
20.0000 mg | Freq: Once | INTRAMUSCULAR | Status: AC
  Administered 2018-01-03: 20 mg via INTRAVENOUS
  Filled 2018-01-03: qty 2

## 2018-01-03 MED ORDER — FAMOTIDINE IN NACL 20-0.9 MG/50ML-% IV SOLN
INTRAVENOUS | Status: AC
  Filled 2018-01-03: qty 50

## 2018-01-03 MED ORDER — DIPHENHYDRAMINE HCL 50 MG/ML IJ SOLN
50.0000 mg | Freq: Once | INTRAMUSCULAR | Status: AC
  Administered 2018-01-03: 50 mg via INTRAVENOUS

## 2018-01-03 MED ORDER — SODIUM CHLORIDE 0.9 % IV SOLN
Freq: Once | INTRAVENOUS | Status: AC
  Administered 2018-01-03: 10:00:00 via INTRAVENOUS
  Filled 2018-01-03: qty 250

## 2018-01-03 MED ORDER — SODIUM CHLORIDE 0.9 % IV SOLN
80.0000 mg/m2 | Freq: Once | INTRAVENOUS | Status: AC
  Administered 2018-01-03: 168 mg via INTRAVENOUS
  Filled 2018-01-03: qty 28

## 2018-01-03 MED ORDER — SODIUM CHLORIDE 0.9 % IV SOLN
INTRAVENOUS | Status: DC
  Administered 2018-01-03: 11:00:00 via INTRAVENOUS
  Filled 2018-01-03: qty 250

## 2018-01-03 MED ORDER — HEPARIN SOD (PORK) LOCK FLUSH 100 UNIT/ML IV SOLN
500.0000 [IU] | Freq: Once | INTRAVENOUS | Status: AC | PRN
  Administered 2018-01-03: 500 [IU]
  Filled 2018-01-03: qty 5

## 2018-01-03 MED ORDER — DIPHENHYDRAMINE HCL 50 MG/ML IJ SOLN
INTRAMUSCULAR | Status: AC
Start: 2018-01-03 — End: ?
  Filled 2018-01-03: qty 1

## 2018-01-03 MED ORDER — FAMOTIDINE IN NACL 20-0.9 MG/50ML-% IV SOLN
20.0000 mg | Freq: Once | INTRAVENOUS | Status: AC
  Administered 2018-01-03: 20 mg via INTRAVENOUS

## 2018-01-03 NOTE — Progress Notes (Signed)
Per Dr. Geralyn Flash instructions, patient instructed to take OTC Calcium 500 mg by mouth twice daily.  Patient verbalized understanding of instructions.

## 2018-01-03 NOTE — Progress Notes (Signed)
Okay to treat with calcium 8.0 per Dr.Gudena.

## 2018-01-06 ENCOUNTER — Telehealth: Payer: Self-pay

## 2018-01-06 ENCOUNTER — Other Ambulatory Visit: Payer: Self-pay

## 2018-01-06 ENCOUNTER — Encounter: Payer: Self-pay | Admitting: Physical Therapy

## 2018-01-06 ENCOUNTER — Ambulatory Visit: Payer: BLUE CROSS/BLUE SHIELD | Attending: General Surgery | Admitting: Physical Therapy

## 2018-01-06 ENCOUNTER — Other Ambulatory Visit: Payer: Self-pay | Admitting: Hematology and Oncology

## 2018-01-06 DIAGNOSIS — R293 Abnormal posture: Secondary | ICD-10-CM | POA: Insufficient documentation

## 2018-01-06 DIAGNOSIS — Z170 Estrogen receptor positive status [ER+]: Secondary | ICD-10-CM | POA: Diagnosis not present

## 2018-01-06 DIAGNOSIS — C50212 Malignant neoplasm of upper-inner quadrant of left female breast: Secondary | ICD-10-CM | POA: Diagnosis not present

## 2018-01-06 DIAGNOSIS — Z483 Aftercare following surgery for neoplasm: Secondary | ICD-10-CM

## 2018-01-06 DIAGNOSIS — M25612 Stiffness of left shoulder, not elsewhere classified: Secondary | ICD-10-CM | POA: Diagnosis not present

## 2018-01-06 DIAGNOSIS — R208 Other disturbances of skin sensation: Secondary | ICD-10-CM

## 2018-01-06 DIAGNOSIS — R600 Localized edema: Secondary | ICD-10-CM | POA: Diagnosis not present

## 2018-01-06 NOTE — Therapy (Signed)
[place], [place], [address] Phone: [phone]   Fax:  [phone]  Physical Therapy Treatment  Patient Details  Name: [name] MRN: [id number] Date of Birth: [birth year] Referring Provider: Dr. Excell Seltzer   Encounter Date: 01/06/2018  PT End of Session - 01/06/18 1410    Visit Number  5    Number of Visits  6    PT Start Time  1749    PT Stop Time  1405    PT Time Calculation (min)  67 min    Activity Tolerance  Patient tolerated treatment well    Behavior During Therapy  [facility] for tasks assessed/performed       Past Medical History:  Diagnosis Date  . Anxiety   . Breast cancer, left ([place]) 07/2017  . Family history of breast cancer   . Family history of colon cancer     Past Surgical History:  Procedure Laterality Date  . BREAST LUMPECTOMY WITH RADIOACTIVE SEED AND SENTINEL LYMPH NODE BIOPSY Left 08/16/2017   Procedure: BREAST LUMPECTOMY WITH RADIOACTIVE SEED AND SENTINEL LYMPH NODE BIOPSY;  Surgeon: Excell Seltzer, MD;  Location: [facility];  Service: General;  Laterality: Left;  . BREAST LUMPECTOMY WITH SENTINEL LYMPH NODE BIOPSY Left 08/16/2017  . BREAST REDUCTION SURGERY Bilateral 08/23/2017   Procedure: LEFT ONCOPLASTIC REDUCTION, RIGHT BREAST REDUCTION;  Surgeon: Irene Limbo, MD;  Location: [facility];  Service: Plastics;  Laterality: Bilateral;  . CESAREAN SECTION  03/15/2012   Procedure: CESAREAN SECTION;  Surgeon: Lovenia Kim, MD;  Location: [facility];  Service: Obstetrics;  Laterality: N/A;  Primary cesarean section with delivery of baby girl at 98. Apgars 4/7.  Marland Kitchen CESAREAN SECTION WITH BILATERAL TUBAL LIGATION Bilateral 09/03/2015   Procedure: REPEAT CESAREAN SECTION WITH BILATERAL TUBAL LIGATION;  Surgeon: Brien Few, MD;  Location: [facility];  Service: Obstetrics;  Laterality: Bilateral;  EDD: 09/10/15  . CHOLECYSTECTOMY  12/07/2011    Procedure: LAPAROSCOPIC CHOLECYSTECTOMY;  Surgeon: Harl Bowie, MD;  Location: [facility];  Service: General;  Laterality: N/A;  . PORTACATH PLACEMENT Right 09/11/2017   Procedure: INSERTION PORT-A-CATH;  Surgeon: Excell Seltzer, MD;  Location: [facility];  Service: General;  Laterality: Right;  . Lillie EXTRACTION  2002    There were no vitals filed for this visit.  Subjective Assessment - 01/06/18 1306    Subjective  Doing ok but I've started having neuropathy in my hands and feet. I have 4 chemo treatments left. I haven't started walking or exercising yet but I plan to.    Pertinent History  Patient underwent a left lumpectomy and sentinel node biopsy (1/2 positive axillary nodes) on 08/16/17. She had a bilateral reduction on 08/23/17. Patient was diagnosed 07/23/17 with left grade 2 invasive ductal carcinoma breast cancer.  It is ER/PR positive and HEr2 negative with a ki67 of 15%.    Patient Stated Goals  Ensure I'm not losing function    Currently in Pain?  Yes    Pain Score  4     Pain Location  Hand   and balls of feet   Pain Orientation  Right;Left    Pain Descriptors / Indicators  Tightness    Pain Type  Neuropathic pain    Pain Onset  In the past 7 days    Pain Frequency  Constant    Aggravating Factors   Chemo    Pain Relieving Factors  Nothing - maybe time  Multiple Pain Sites  No         [facility] PT Assessment - 01/06/18 0001      Observation/Other Assessments   Observations  Pt reprots lateral proximal trunk edema is no longer noticable      Sensation   Light Touch  Appears Intact   Can feel light touch but reports fingers feel "asleep"   Stereognosis  Appears Intact    Hot/Cold  Appears Intact   Pt reported no issues with feeling temperature of water when   Proprioception  Appears Intact    Additional Comments  Used end of paper clip (cleaned with alcohol) to determine sensation bilateral hands and feet with eyes closed. Pt able to identify area being  marked each time.      ROM / Strength   AROM / PROM / Strength  AROM      AROM   Overall AROM Comments  Bilateral shoulders WNL        LYMPHEDEMA/ONCOLOGY QUESTIONNAIRE - 01/06/18 1326      Right Upper Extremity Lymphedema   10 cm Proximal to Olecranon Process  36 cm    Olecranon Process  28.8 cm    10 cm Proximal to Ulnar Styloid Process  25.9 cm    Just Proximal to Ulnar Styloid Process  18.6 cm    Across Hand at [employer]  20.2 cm    At [place] of 2nd Digit  6.6 cm      Left Upper Extremity Lymphedema   10 cm Proximal to Olecranon Process  37 cm    Olecranon Process  29.7 cm    10 cm Proximal to Ulnar Styloid Process  25.8 cm    Just Proximal to Ulnar Styloid Process  18.4 cm    Across Hand at [employer]  19.9 cm    At [place] of 2nd Digit  6.8 cm        Quick Dash - 01/06/18 0001    Open a tight or new jar  No difficulty    Do heavy household chores (wash walls, wash floors)  No difficulty    Carry a shopping bag or briefcase  No difficulty    Wash your back  No difficulty    Use a knife to cut food  No difficulty    Recreational activities in which you take some force or impact through your arm, shoulder, or hand (golf, hammering, tennis)  No difficulty    During the past week, to what extent has your arm, shoulder or hand problem interfered with your normal social activities with family, friends, neighbors, or groups?  Not at all    During the past week, to what extent has your arm, shoulder or hand problem limited your work or other regular daily activities  Not at all    Arm, shoulder, or hand pain.  None    Tingling (pins and needles) in your arm, shoulder, or hand  Mild    Difficulty Sleeping  No difficulty    DASH Score  2.27 %                     PT Education - 01/06/18 1409    Education provided  Yes    Education Details  Educated pt further on importance of a walking program or regular exercise program. 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    Person(s) Educated  Patient    Methods  Explanation  Comprehension  Verbalized understanding          PT Long Term Goals - 01/06/18 1421      PT LONG TERM GOAL #1   Title  Patient will be able to demonstrate she has regained shoulder ROM and function post operatively.    Time  4    Period  Months    Status  Achieved      PT LONG TERM GOAL #2   Title  Patient will demonstrate active ROM flexion and abduction has returned to 160 degrees for increased ease reaching overhead.    Time  4    Period  Months    Status  On-going      PT LONG TERM GOAL #3   Title  Patient will report >/= 50% improvement in edema on her bilateral trunk area just below her axillae.    Time  4    Period  Months    Status  Achieved      PT LONG TERM GOAL #4   Title  Patient will report she has returned to all normal daily tasks without difficulty.    Time  4    Period  Months    Status  Achieved            Plan - 01/06/18 1410    Clinical Impression Statement  Patient's arms measurements have increased bilaterally but that is likely due to a 22 pound weight gain since her diagnosis. There is no sign of lymphedema. We discussed at length lymphedema risk and the connection lymphedema risk has to BMI. We discussed the importance of a regular exercise program for reducing recurrance risk and also lymphedema risk as it would allow her to better control her weight. She verbalized good understanding and has plans to begin walking on her newly purchased treadmill. She spoke with Dr. Geralyn Flash nurse while at PT today to discuss concerns of CIPN and whether to continue chemotherapy as she has completed 8/12 Taxol treatments. She is waiting to hear her MD's recommendation. Her sensation was assessed today and while she has tingling present in hands and feet, it appears to be intact with pin prick sensation and light touch. PT will D/C for now and reassess after completion of radiation to  allow for close surveillance of her arms since she will be at increased risk of lymphedema after her left axilla is radiated.     Rehab Potential  Excellent    PT Treatment/Interventions  ADLs/Self Care Home Management;Therapeutic exercise;Patient/family education;Passive range of motion;Scar mobilization;Manual techniques;Manual lymph drainage    PT Next Visit Plan  D/C for now. Will reassess after completion of radiation.    Consulted and Agree with Plan of Care  Patient       Patient will benefit from skilled therapeutic intervention in order to improve the following deficits and impairments:  Decreased knowledge of precautions, Impaired UE functional use, Decreased range of motion, Postural dysfunction, Pain, Increased fascial restricitons, Increased edema, Impaired sensation  Visit Diagnosis: Malignant neoplasm of upper-inner quadrant of left breast in female, estrogen receptor positive ([facility])  Abnormal posture  Aftercare following surgery for neoplasm  Stiffness of left shoulder, not elsewhere classified  Localized edema  Other disturbances of skin sensation     Problem List Patient Active Problem List   Diagnosis Date Noted  . Port-A-Cath in place 09/12/2017  . Genetic testing 08/09/2017  . Family history of breast cancer   . Family history of colon cancer   .  Family history of cancer   . Malignant neoplasm of upper-inner quadrant of left breast in female, estrogen receptor positive ([place]) 07/30/2017  . Previous cesarean section 09/05/2015  . Postpartum care following cesarean delivery with btl (4/8) 09/04/2015  . Symptomatic cholelithiasis 11/20/2011   PHYSICAL THERAPY DISCHARGE SUMMARY  Visits from Start of Care: 5  Current functional level related to goals / functional outcomes: Patient has met goals except mild limitation in shoulder ROM compared with baseline but reports no functional deficit.   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   Education / Equipment: HEP; lymphedema risk reduction; importance of exercise program Plan: Patient agrees to discharge.  Patient goals were partially met. Patient is being discharged due to being pleased with the current functional level.  ?????         Annia Friendly, [place] 01/06/18 2:30 PM  [place], [place], [address] Phone: [phone]   Fax:  [phone]  Name: [name] MRN: [id number] Date of Birth: 1980/10/15

## 2018-01-06 NOTE — Telephone Encounter (Signed)
Returned patient's call.  Patient c/o severe neuropathy since last Taxol treatment on 01/03/2018.  Patient is able to grasp objects with minimal difficulty, however noticed trouble with buttons this morning. Per recommendations by Dr. Lindi Adie, patient is scheduled to see him on 01/07/2018 for evaluation. Patient aware of appointment time and date.  No further needs at this time.

## 2018-01-07 ENCOUNTER — Inpatient Hospital Stay (HOSPITAL_BASED_OUTPATIENT_CLINIC_OR_DEPARTMENT_OTHER): Payer: BLUE CROSS/BLUE SHIELD | Admitting: Hematology and Oncology

## 2018-01-07 DIAGNOSIS — Z5111 Encounter for antineoplastic chemotherapy: Secondary | ICD-10-CM | POA: Diagnosis not present

## 2018-01-07 DIAGNOSIS — Z79899 Other long term (current) drug therapy: Secondary | ICD-10-CM

## 2018-01-07 DIAGNOSIS — R5383 Other fatigue: Secondary | ICD-10-CM | POA: Diagnosis not present

## 2018-01-07 DIAGNOSIS — C50212 Malignant neoplasm of upper-inner quadrant of left female breast: Secondary | ICD-10-CM | POA: Diagnosis not present

## 2018-01-07 DIAGNOSIS — G629 Polyneuropathy, unspecified: Secondary | ICD-10-CM | POA: Diagnosis not present

## 2018-01-07 DIAGNOSIS — Z170 Estrogen receptor positive status [ER+]: Secondary | ICD-10-CM | POA: Diagnosis not present

## 2018-01-07 MED ORDER — AZITHROMYCIN 200 MG/5ML PO SUSR: 250.0000 mg | Freq: Every day | ORAL | 0 refills | Status: AC

## 2018-01-07 NOTE — Assessment & Plan Note (Signed)
08/19/2017:Left lumpectomy: IDC grade 2, 2.5 cm, DCIS intermediate grade, lymphovascular invasion identified, margins negative, 1/2 lymph nodes positive with extracapsular extension, ER 90%, PR 40%, HER-2 negative ratio 1.15, Ki-67 15% T2 N1a stage II a; Mammaprint high risk   Recommendation: 1. Adjuvant chemotherapy with dose dense Adriamycin and Cytoxan x4 followed by Taxol weekly x12, echocardiogram 09/04/17 was normal with EF of 55-60%, echo 10/29/17 normal with EF 60-65% 2. followed by adjuvant radiation therapy 3. Followed by adjuvant antiestrogen therapy  _______________________________________________________________________________________________  Current treatment: Completed 8 cycles of Taxol Taxol toxicities: Neuropathy: Denies any nausea vomiting.  She wants to get the port out as soon as she finishes with chemo. She may also consider having oophorectomy between chemo and radiation Monitoring closely for toxicities

## 2018-01-07 NOTE — Progress Notes (Signed)
Patient Care Team: Jonathon Jordan, MD as PCP - General (Family Medicine)  DIAGNOSIS:  Encounter Diagnosis  Name Primary?  . Malignant neoplasm of upper-inner quadrant of left breast in female, estrogen receptor positive ([place])     SUMMARY OF ONCOLOGIC HISTORY:   Malignant neoplasm of upper-inner quadrant of left breast in female, estrogen receptor positive ([place])   07/24/2017 Initial Diagnosis    Left breast palpable lump upper inner quadrant 11 o'clock position: Ill-defined 1.6 cm mass, axilla ultrasound negative, ultrasound-guided biopsy: Grade 2 IDC ER 90%, PR 40%, Ki-67 15%, HER-2 negative    08/09/2017 Genetic Testing    Negative genetic testing common hereditary cancer panel.  The Hereditary Gene Panel offered by [facility] includes sequencing and/or deletion duplication testing of the following 47 genes: APC, ATM, AXIN2, BARD1, BMPR1A, BRCA1, BRCA2, BRIP1, CDH1, CDK4, CDKN2A (p14ARF), CDKN2A (p16INK4a), CHEK2, CTNNA1, DICER1, EPCAM (Deletion/duplication testing only), GREM1 (promoter region deletion/duplication testing only), KIT, MEN1, MLH1, MSH2, MSH3, MSH6, MUTYH, NBN, NF1, NHTL1, PALB2, PDGFRA, PMS2, POLD1, POLE, PTEN, RAD50, RAD51C, RAD51D, SDHB, SDHC, SDHD, SMAD4, SMARCA4. STK11, TP53, TSC1, TSC2, and VHL.  The following genes were evaluated for sequence changes only: SDHA and HOXB13 c.251G>A variant only. The report date is August 07, 2017.    08/16/2017 Surgery    Left lumpectomy: IDC grade 2, 2.5 cm, DCIS intermediate grade, lymphovascular invasion identified, margins negative, 1/2 lymph nodes positive with extracapsular extension, ER 90%, PR 40%, HER-2 negative ratio 1.15, Ki-67 15% T2 N1a stage II a; Mammaprint high risk     09/12/2017 -  Chemotherapy    Adjuvant chemotherapy with dose dense Adriamycin and Cytoxan x4 followed by Taxol weekly x12      CHIEF COMPLIANT: Follow-up to discuss neuropathy issues  INTERVAL HISTORY: [name] is a 37 year old with  above-mentioned his left breast cancer treated with lumpectomy is now on adjuvant chemotherapy and today is a follow-up to discuss her recent neuropathy issues.  After the last cycle of chemotherapy she felt more tingling and numbness across the balls of her feet that lasted 1 day.  In the hands she had numbness in the pads of both her hands but it disappeared on the right side and it is still present on the left side.  She had slight difficulty in buttoning her daughter showed but has not had any major difficulties from then.  She has not stumbled or dropped any objects.  She is complaining of a year fullness on the left side along with postnasal drip.  REVIEW OF SYSTEMS:   Constitutional: Denies fevers, chills or abnormal weight loss Eyes: Denies blurriness of vision Ears, nose, mouth, throat, and face: Fullness in the left ear with cloudiness in the eardrum suggestive of middle ear infection Respiratory: Denies cough, dyspnea or wheezes Cardiovascular: Denies palpitation, chest discomfort Gastrointestinal:  Denies nausea, heartburn or change in bowel habits Skin: Denies abnormal skin rashes Lymphatics: Denies new lymphadenopathy or easy bruising Neurological: Mild neuropathy Behavioral/Psych: Mood is stable, no new changes  Extremities: No lower extremity edema   All other systems were reviewed with the patient and are negative.  I have reviewed the past medical history, past surgical history, social history and family history with the patient and they are unchanged from previous note.  ALLERGIES:  has No Known Allergies.  MEDICATIONS:  Current Outpatient Medications  Medication Sig Dispense Refill  . azithromycin (ZITHROMAX) 200 MG/5ML suspension Take 6.3 mLs (250 mg total) by mouth daily for 5 days. 30 mL 0  .  escitalopram (LEXAPRO) 20 MG tablet Take 20 mg by mouth daily with supper.     . lidocaine-prilocaine (EMLA) cream Apply to affected area once 30 g 3   No current  facility-administered medications for this visit.    Facility-Administered Medications Ordered in Other Visits  Medication Dose Route Frequency Provider Last Rate Last Dose  . heparin lock flush 100 unit/mL  500 Units Intracatheter Once PRN Nicholas Lose, MD      . sodium chloride flush (NS) 0.9 % injection 10 mL  10 mL Intracatheter PRN Nicholas Lose, MD   10 mL at 11/15/17 1449  . sodium chloride flush (NS) 0.9 % injection 10 mL  10 mL Intracatheter PRN Nicholas Lose, MD      . sodium chloride flush (NS) 0.9 % injection 10 mL  10 mL Intracatheter PRN Nicholas Lose, MD   10 mL at 12/13/17 1500    PHYSICAL EXAMINATION: ECOG PERFORMANCE STATUS: 1 - Symptomatic but completely ambulatory  Vitals:   01/07/18 1339  BP: (!) 142/88  Pulse: 93  Resp: 20  Temp: 98.5 F (36.9 C)  SpO2: 99%   Filed Weights   01/07/18 1339  Weight: 247 lb (112 kg)    GENERAL:alert, no distress and comfortable SKIN: skin color, texture, turgor are normal, no rashes or significant lesions EYES: normal, Conjunctiva are pink and non-injected, sclera clear OROPHARYNX:no exudate, no erythema and lips, buccal mucosa, and tongue normal  NECK: supple, thyroid normal size, non-tender, without nodularity LYMPH:  no palpable lymphadenopathy in the cervical, axillary or inguinal LUNGS: clear to auscultation and percussion with normal breathing effort HEART: regular rate & rhythm and no murmurs and no lower extremity edema ABDOMEN:abdomen soft, non-tender and normal bowel sounds MUSCULOSKELETAL:no cyanosis of digits and no clubbing  NEURO: alert & oriented x 3 with fluent speech, grade 1 peripheral neuropathy EXTREMITIES: No lower extremity edema   LABORATORY DATA:  I have reviewed the data as listed CMP Latest Ref Rng & Units 01/03/2018 12/27/2017 12/20/2017  Glucose 70 - 99 mg/dL 137(H) 154(H) 98  BUN 6 - 20 mg/dL '16 12 13  '$ Creatinine 0.44 - 1.00 mg/dL 0.77 0.79 0.75  Sodium 135 - 145 mmol/L 139 139 140  Potassium  3.5 - 5.1 mmol/L 4.2 4.0 4.2  Chloride 98 - 111 mmol/L 108 106 108  CO2 22 - 32 mmol/L '23 24 26  '$ Calcium 8.9 - 10.3 mg/dL 8.0(L) 8.4(L) 8.6(L)  Total Protein 6.5 - 8.1 g/dL 6.1(L) 6.4(L) 6.3(L)  Total Bilirubin 0.3 - 1.2 mg/dL 0.3 0.4 0.3  Alkaline Phos 38 - 126 U/L 92 87 81  AST 15 - 41 U/L '23 22 26  '$ ALT 0 - 44 U/L 38 35 42    Lab Results  Component Value Date   WBC 4.2 01/03/2018   HGB 10.8 (L) 01/03/2018   HCT 31.5 (L) 01/03/2018   MCV 92.2 01/03/2018   PLT 261 01/03/2018   NEUTROABS 2.8 01/03/2018    ASSESSMENT & PLAN:  Malignant neoplasm of upper-inner quadrant of left breast in female, estrogen receptor positive ([facility]) 08/19/2017:Left lumpectomy: IDC grade 2, 2.5 cm, DCIS intermediate grade, lymphovascular invasion identified, margins negative, 1/2 lymph nodes positive with extracapsular extension, ER 90%, PR 40%, HER-2 negative ratio 1.15, Ki-67 15% T2 N1a stage II a; Mammaprint high risk   Recommendation: 1. Adjuvant chemotherapy with dose dense Adriamycin and Cytoxan x4 followed by Taxol weekly x12, echocardiogram 09/04/17 was normal with EF of 55-60%, echo 10/29/17 normal with EF 60-65% 2. followed  by adjuvant radiation therapy 3. Followed by adjuvant antiestrogen therapy  _______________________________________________________________________________________________  Current treatment: Completed 8 cycles of Taxol Taxol toxicities: Neuropathy: Grade 1-2 I discussed about reducing the dosage of Taxol.  We reduced it from 80 mg/m to 60 mg/m.  If she continues to have worsening symptoms or may have to reduce it further. Denies any nausea vomiting.  She wants to get the port out as soon as she finishes with chemo. She may also consider having oophorectomy between chemo and radiation Monitoring closely for toxicities   No orders of the defined types were placed in this encounter.  The patient has a good understanding of the overall plan. she agrees with it. she will  call with any problems that may develop before the next visit here.   Harriette Ohara, MD 01/07/18

## 2018-01-10 ENCOUNTER — Inpatient Hospital Stay: Payer: BLUE CROSS/BLUE SHIELD

## 2018-01-10 VITALS — BP 133/81 | HR 83 | Temp 98.5°F | Resp 17

## 2018-01-10 DIAGNOSIS — Z79899 Other long term (current) drug therapy: Secondary | ICD-10-CM | POA: Diagnosis not present

## 2018-01-10 DIAGNOSIS — C50212 Malignant neoplasm of upper-inner quadrant of left female breast: Secondary | ICD-10-CM

## 2018-01-10 DIAGNOSIS — Z170 Estrogen receptor positive status [ER+]: Principal | ICD-10-CM

## 2018-01-10 DIAGNOSIS — G629 Polyneuropathy, unspecified: Secondary | ICD-10-CM | POA: Diagnosis not present

## 2018-01-10 DIAGNOSIS — R5383 Other fatigue: Secondary | ICD-10-CM | POA: Diagnosis not present

## 2018-01-10 DIAGNOSIS — Z5111 Encounter for antineoplastic chemotherapy: Secondary | ICD-10-CM | POA: Diagnosis not present

## 2018-01-10 DIAGNOSIS — Z95828 Presence of other vascular implants and grafts: Secondary | ICD-10-CM

## 2018-01-10 LAB — CMP (CANCER CENTER ONLY)
ALT: 47 U/L — ABNORMAL HIGH (ref 0–44)
AST: 31 U/L (ref 15–41)
Albumin: 3.6 g/dL (ref 3.5–5.0)
Alkaline Phosphatase: 84 U/L (ref 38–126)
Anion gap: 9 (ref 5–15)
BUN: 10 mg/dL (ref 6–20)
CHLORIDE: 107 mmol/L (ref 98–111)
CO2: 25 mmol/L (ref 22–32)
CREATININE: 0.78 mg/dL (ref 0.44–1.00)
Calcium: 8.5 mg/dL — ABNORMAL LOW (ref 8.9–10.3)
Glucose, Bld: 133 mg/dL — ABNORMAL HIGH (ref 70–99)
Potassium: 3.8 mmol/L (ref 3.5–5.1)
SODIUM: 141 mmol/L (ref 135–145)
Total Bilirubin: 0.4 mg/dL (ref 0.3–1.2)
Total Protein: 6.5 g/dL (ref 6.5–8.1)

## 2018-01-10 LAB — CBC WITH DIFFERENTIAL (CANCER CENTER ONLY)
BASOS ABS: 0.1 10*3/uL (ref 0.0–0.1)
Basophils Relative: 2 %
EOS ABS: 0.1 10*3/uL (ref 0.0–0.5)
EOS PCT: 3 %
HCT: 33.5 % — ABNORMAL LOW (ref 34.8–46.6)
HEMOGLOBIN: 11.4 g/dL — AB (ref 11.6–15.9)
LYMPHS ABS: 1.3 10*3/uL (ref 0.9–3.3)
LYMPHS PCT: 27 %
MCH: 31.1 pg (ref 25.1–34.0)
MCHC: 34 g/dL (ref 31.5–36.0)
MCV: 91.3 fL (ref 79.5–101.0)
Monocytes Absolute: 0.2 10*3/uL (ref 0.1–0.9)
Monocytes Relative: 4 %
NEUTROS PCT: 64 %
Neutro Abs: 3.1 10*3/uL (ref 1.5–6.5)
PLATELETS: 258 10*3/uL (ref 145–400)
RBC: 3.67 MIL/uL — AB (ref 3.70–5.45)
RDW: 13.8 % (ref 11.2–14.5)
WBC: 4.8 10*3/uL (ref 3.9–10.3)

## 2018-01-10 MED ORDER — FAMOTIDINE IN NACL 20-0.9 MG/50ML-% IV SOLN
INTRAVENOUS | Status: AC
  Filled 2018-01-10: qty 50

## 2018-01-10 MED ORDER — SODIUM CHLORIDE 0.9% FLUSH
10.0000 mL | INTRAVENOUS | Status: DC | PRN
  Administered 2018-01-10: 10 mL
  Filled 2018-01-10: qty 10

## 2018-01-10 MED ORDER — SODIUM CHLORIDE 0.9 % IV SOLN
20.0000 mg | Freq: Once | INTRAVENOUS | Status: AC
  Administered 2018-01-10: 20 mg via INTRAVENOUS
  Filled 2018-01-10: qty 2

## 2018-01-10 MED ORDER — SODIUM CHLORIDE 0.9 % IV SOLN
Freq: Once | INTRAVENOUS | Status: AC
  Administered 2018-01-10: 09:00:00 via INTRAVENOUS
  Filled 2018-01-10: qty 250

## 2018-01-10 MED ORDER — HEPARIN SOD (PORK) LOCK FLUSH 100 UNIT/ML IV SOLN
500.0000 [IU] | Freq: Once | INTRAVENOUS | Status: AC | PRN
  Administered 2018-01-10: 500 [IU]
  Filled 2018-01-10: qty 5

## 2018-01-10 MED ORDER — DIPHENHYDRAMINE HCL 50 MG/ML IJ SOLN
50.0000 mg | Freq: Once | INTRAMUSCULAR | Status: AC
  Administered 2018-01-10: 50 mg via INTRAVENOUS

## 2018-01-10 MED ORDER — SODIUM CHLORIDE 0.9 % IV SOLN
60.0000 mg/m2 | Freq: Once | INTRAVENOUS | Status: AC
  Administered 2018-01-10: 126 mg via INTRAVENOUS
  Filled 2018-01-10: qty 21

## 2018-01-10 MED ORDER — DIPHENHYDRAMINE HCL 50 MG/ML IJ SOLN
INTRAMUSCULAR | Status: AC
  Filled 2018-01-10: qty 1

## 2018-01-10 MED ORDER — PALONOSETRON HCL INJECTION 0.25 MG/5ML
INTRAVENOUS | Status: AC
  Filled 2018-01-10: qty 5

## 2018-01-10 MED ORDER — FAMOTIDINE IN NACL 20-0.9 MG/50ML-% IV SOLN
20.0000 mg | Freq: Once | INTRAVENOUS | Status: AC
  Administered 2018-01-10: 20 mg via INTRAVENOUS

## 2018-01-10 NOTE — Patient Instructions (Signed)
[facility] Discharge Instructions for Patients Receiving Chemotherapy  Today you received the following chemotherapy agents:  Taxol.  To help prevent nausea and vomiting after your treatment, we encourage you to take your nausea medication as directed.   If you develop nausea and vomiting that is not controlled by your nausea medication, call the clinic.   BELOW ARE SYMPTOMS THAT SHOULD BE REPORTED IMMEDIATELY:  *FEVER GREATER THAN 100.5 F  *CHILLS WITH OR WITHOUT FEVER  NAUSEA AND VOMITING THAT IS NOT CONTROLLED WITH YOUR NAUSEA MEDICATION  *UNUSUAL SHORTNESS OF BREATH  *UNUSUAL BRUISING OR BLEEDING  TENDERNESS IN MOUTH AND THROAT WITH OR WITHOUT PRESENCE OF ULCERS  *URINARY PROBLEMS  *BOWEL PROBLEMS  UNUSUAL RASH Items with * indicate a potential emergency and should be followed up as soon as possible.  Feel free to call the clinic should you have any questions or concerns. The clinic phone number is [phone].  Please show the CHEMO ALERT CARD at check-in to the Emergency Department and triage nurse.   

## 2018-01-15 ENCOUNTER — Encounter: Payer: Self-pay | Admitting: Hematology and Oncology

## 2018-01-15 NOTE — Progress Notes (Signed)
Pt's grant w/ [employer] has been closed as of 12/29/17 because pt did not return the info they requested to keep her grant open.  Pt's grant can be reopened if she returns the paperwork the [name] is requesting.  I will inform the pt.

## 2018-01-17 ENCOUNTER — Inpatient Hospital Stay: Payer: BLUE CROSS/BLUE SHIELD

## 2018-01-17 ENCOUNTER — Inpatient Hospital Stay (HOSPITAL_BASED_OUTPATIENT_CLINIC_OR_DEPARTMENT_OTHER): Payer: BLUE CROSS/BLUE SHIELD | Admitting: Hematology and Oncology

## 2018-01-17 ENCOUNTER — Encounter: Payer: Self-pay | Admitting: *Deleted

## 2018-01-17 ENCOUNTER — Telehealth: Payer: Self-pay | Admitting: Hematology and Oncology

## 2018-01-17 ENCOUNTER — Other Ambulatory Visit: Payer: Self-pay | Admitting: *Deleted

## 2018-01-17 DIAGNOSIS — Z79899 Other long term (current) drug therapy: Secondary | ICD-10-CM

## 2018-01-17 DIAGNOSIS — Z170 Estrogen receptor positive status [ER+]: Secondary | ICD-10-CM

## 2018-01-17 DIAGNOSIS — C50212 Malignant neoplasm of upper-inner quadrant of left female breast: Secondary | ICD-10-CM | POA: Diagnosis not present

## 2018-01-17 DIAGNOSIS — Z95828 Presence of other vascular implants and grafts: Secondary | ICD-10-CM

## 2018-01-17 DIAGNOSIS — R5383 Other fatigue: Secondary | ICD-10-CM

## 2018-01-17 DIAGNOSIS — G629 Polyneuropathy, unspecified: Secondary | ICD-10-CM | POA: Diagnosis not present

## 2018-01-17 DIAGNOSIS — Z5111 Encounter for antineoplastic chemotherapy: Secondary | ICD-10-CM | POA: Diagnosis not present

## 2018-01-17 LAB — CMP (CANCER CENTER ONLY)
ALBUMIN: 3.5 g/dL (ref 3.5–5.0)
ALK PHOS: 87 U/L (ref 38–126)
ALT: 51 U/L — ABNORMAL HIGH (ref 0–44)
ANION GAP: 10 (ref 5–15)
AST: 31 U/L (ref 15–41)
BUN: 11 mg/dL (ref 6–20)
CHLORIDE: 107 mmol/L (ref 98–111)
CO2: 25 mmol/L (ref 22–32)
Calcium: 8.8 mg/dL — ABNORMAL LOW (ref 8.9–10.3)
Creatinine: 0.79 mg/dL (ref 0.44–1.00)
GFR, Est AFR Am: >60 mL/min (ref >60)
GFR, Estimated: >60 mL/min (ref >60)
Glucose, Bld: 133 mg/dL — ABNORMAL HIGH (ref 70–99)
POTASSIUM: 4.1 mmol/L (ref 3.5–5.1)
SODIUM: 142 mmol/L (ref 135–145)
Total Bilirubin: 0.3 mg/dL (ref 0.3–1.2)
Total Protein: 6.3 g/dL — ABNORMAL LOW (ref 6.5–8.1)

## 2018-01-17 LAB — CBC WITH DIFFERENTIAL (CANCER CENTER ONLY)
BASOS ABS: 0 10*3/uL (ref 0.0–0.1)
Basophils Relative: 1 %
Eosinophils Absolute: 0.1 10*3/uL (ref 0.0–0.5)
Eosinophils Relative: 2 %
HEMATOCRIT: 32.8 % — AB (ref 34.8–46.6)
HEMOGLOBIN: 11.2 g/dL — AB (ref 11.6–15.9)
LYMPHS ABS: 1.1 10*3/uL (ref 0.9–3.3)
LYMPHS PCT: 24 %
MCH: 31.3 pg (ref 25.1–34.0)
MCHC: 34.1 g/dL (ref 31.5–36.0)
MCV: 91.6 fL (ref 79.5–101.0)
Monocytes Absolute: 0.2 10*3/uL (ref 0.1–0.9)
Monocytes Relative: 5 %
NEUTROS ABS: 3.2 10*3/uL (ref 1.5–6.5)
NEUTROS PCT: 68 %
Platelet Count: 257 10*3/uL (ref 145–400)
RBC: 3.58 MIL/uL — ABNORMAL LOW (ref 3.70–5.45)
RDW: 14.2 % (ref 11.2–14.5)
WBC Count: 4.6 10*3/uL (ref 3.9–10.3)

## 2018-01-17 MED ORDER — FAMOTIDINE IN NACL 20-0.9 MG/50ML-% IV SOLN
20.0000 mg | Freq: Once | INTRAVENOUS | Status: AC
  Administered 2018-01-17: 20 mg via INTRAVENOUS

## 2018-01-17 MED ORDER — FAMOTIDINE IN NACL 20-0.9 MG/50ML-% IV SOLN
INTRAVENOUS | Status: AC
  Filled 2018-01-17: qty 50

## 2018-01-17 MED ORDER — DIPHENHYDRAMINE HCL 50 MG/ML IJ SOLN
INTRAMUSCULAR | Status: AC
  Filled 2018-01-17: qty 1

## 2018-01-17 MED ORDER — SODIUM CHLORIDE 0.9 % IV SOLN
20.0000 mg | Freq: Once | INTRAVENOUS | Status: AC
  Administered 2018-01-17: 20 mg via INTRAVENOUS
  Filled 2018-01-17: qty 2

## 2018-01-17 MED ORDER — SODIUM CHLORIDE 0.9 % IV SOLN
Freq: Once | INTRAVENOUS | Status: AC
  Administered 2018-01-17: 10:00:00 via INTRAVENOUS
  Filled 2018-01-17: qty 250

## 2018-01-17 MED ORDER — HEPARIN SOD (PORK) LOCK FLUSH 100 UNIT/ML IV SOLN
500.0000 [IU] | Freq: Once | INTRAVENOUS | Status: AC | PRN
  Administered 2018-01-17: 500 [IU]
  Filled 2018-01-17: qty 5

## 2018-01-17 MED ORDER — SODIUM CHLORIDE 0.9% FLUSH
10.0000 mL | INTRAVENOUS | Status: DC | PRN
  Administered 2018-01-17: 10 mL
  Filled 2018-01-17: qty 10

## 2018-01-17 MED ORDER — SODIUM CHLORIDE 0.9 % IV SOLN
60.0000 mg/m2 | Freq: Once | INTRAVENOUS | Status: AC
  Administered 2018-01-17: 126 mg via INTRAVENOUS
  Filled 2018-01-17: qty 21

## 2018-01-17 MED ORDER — DIPHENHYDRAMINE HCL 50 MG/ML IJ SOLN
50.0000 mg | Freq: Once | INTRAMUSCULAR | Status: AC
  Administered 2018-01-17: 50 mg via INTRAVENOUS

## 2018-01-17 NOTE — Telephone Encounter (Signed)
Per 8/23 los, no new orders or referrals.

## 2018-01-17 NOTE — Progress Notes (Signed)
Patient Care Team: Jonathon Jordan, MD as PCP - General (Family Medicine)  DIAGNOSIS:  Encounter Diagnosis  Name Primary?  . Malignant neoplasm of upper-inner quadrant of left breast in female, estrogen receptor positive ([place])     SUMMARY OF ONCOLOGIC HISTORY:   Malignant neoplasm of upper-inner quadrant of left breast in female, estrogen receptor positive ([place])   07/24/2017 Initial Diagnosis    Left breast palpable lump upper inner quadrant 11 o'clock position: Ill-defined 1.6 cm mass, axilla ultrasound negative, ultrasound-guided biopsy: Grade 2 IDC ER 90%, PR 40%, Ki-67 15%, HER-2 negative    08/09/2017 Genetic Testing    Negative genetic testing common hereditary cancer panel.  The Hereditary Gene Panel offered by [facility] includes sequencing and/or deletion duplication testing of the following 47 genes: APC, ATM, AXIN2, BARD1, BMPR1A, BRCA1, BRCA2, BRIP1, CDH1, CDK4, CDKN2A (p14ARF), CDKN2A (p16INK4a), CHEK2, CTNNA1, DICER1, EPCAM (Deletion/duplication testing only), GREM1 (promoter region deletion/duplication testing only), KIT, MEN1, MLH1, MSH2, MSH3, MSH6, MUTYH, NBN, NF1, NHTL1, PALB2, PDGFRA, PMS2, POLD1, POLE, PTEN, RAD50, RAD51C, RAD51D, SDHB, SDHC, SDHD, SMAD4, SMARCA4. STK11, TP53, TSC1, TSC2, and VHL.  The following genes were evaluated for sequence changes only: SDHA and HOXB13 c.251G>A variant only. The report date is August 07, 2017.    08/16/2017 Surgery    Left lumpectomy: IDC grade 2, 2.5 cm, DCIS intermediate grade, lymphovascular invasion identified, margins negative, 1/2 lymph nodes positive with extracapsular extension, ER 90%, PR 40%, HER-2 negative ratio 1.15, Ki-67 15% T2 N1a stage II a; Mammaprint high risk     09/12/2017 -  Chemotherapy    Adjuvant chemotherapy with dose dense Adriamycin and Cytoxan x4 followed by Taxol weekly x12      CHIEF COMPLIANT: Cycle 10 Taxol  INTERVAL HISTORY: [name] is a 37 year old with above-mentioned history of left  breast cancer treated with lumpectomy and is now on adjuvant chemotherapy today cycle 10 of Taxol.  She appears to be tolerating Taxol reasonably well.  Neuropathy appears to have improved slightly.  However fatigue appears to have gotten slightly worse.  Denies any nausea or vomiting.  REVIEW OF SYSTEMS:   Constitutional: Denies fevers, chills or abnormal weight loss Eyes: Denies blurriness of vision Ears, nose, mouth, throat, and face: Denies mucositis or sore throat Respiratory: Denies cough, dyspnea or wheezes Cardiovascular: Denies palpitation, chest discomfort Gastrointestinal:  Denies nausea, heartburn or change in bowel habits Skin: Denies abnormal skin rashes Lymphatics: Denies new lymphadenopathy or easy bruising Neurological:Denies numbness, tingling or new weaknesses Behavioral/Psych: Mood is stable, no new changes  Extremities: No lower extremity edema Breast:  denies any pain or lumps or nodules in either breasts All other systems were reviewed with the patient and are negative.  I have reviewed the past medical history, past surgical history, social history and family history with the patient and they are unchanged from previous note.  ALLERGIES:  has No Known Allergies.  MEDICATIONS:  Current Outpatient Medications  Medication Sig Dispense Refill  . escitalopram (LEXAPRO) 20 MG tablet Take 20 mg by mouth daily with supper.     . lidocaine-prilocaine (EMLA) cream Apply to affected area once 30 g 3   No current facility-administered medications for this visit.    Facility-Administered Medications Ordered in Other Visits  Medication Dose Route Frequency Provider Last Rate Last Dose  . heparin lock flush 100 unit/mL  500 Units Intracatheter Once PRN Nicholas Lose, MD      . sodium chloride flush (NS) 0.9 % injection 10 mL  10  mL Intracatheter PRN Nicholas Lose, MD   10 mL at 11/15/17 1449  . sodium chloride flush (NS) 0.9 % injection 10 mL  10 mL Intracatheter PRN Nicholas Lose, MD      . sodium chloride flush (NS) 0.9 % injection 10 mL  10 mL Intracatheter PRN Nicholas Lose, MD   10 mL at 12/13/17 1500  . sodium chloride flush (NS) 0.9 % injection 10 mL  10 mL Intracatheter PRN Nicholas Lose, MD   10 mL at 01/17/18 0842    PHYSICAL EXAMINATION: ECOG PERFORMANCE STATUS: 1 - Symptomatic but completely ambulatory  Vitals:   01/17/18 0908  BP: 133/79  Pulse: 89  Resp: 18  Temp: 98.5 F (36.9 C)  SpO2: 99%   Filed Weights   01/17/18 0908  Weight: 251 lb 4.8 oz (114 kg)    GENERAL:alert, no distress and comfortable SKIN: skin color, texture, turgor are normal, no rashes or significant lesions EYES: normal, Conjunctiva are pink and non-injected, sclera clear OROPHARYNX:no exudate, no erythema and lips, buccal mucosa, and tongue normal  NECK: supple, thyroid normal size, non-tender, without nodularity LYMPH:  no palpable lymphadenopathy in the cervical, axillary or inguinal LUNGS: clear to auscultation and percussion with normal breathing effort HEART: regular rate & rhythm and no murmurs and no lower extremity edema ABDOMEN:abdomen soft, non-tender and normal bowel sounds MUSCULOSKELETAL:no cyanosis of digits and no clubbing  NEURO: alert & oriented x 3 with fluent speech, no focal motor/sensory deficits EXTREMITIES: No lower extremity edema    LABORATORY DATA:  I have reviewed the data as listed CMP Latest Ref Rng & Units 01/10/2018 01/03/2018 12/27/2017  Glucose 70 - 99 mg/dL 133(H) 137(H) 154(H)  BUN 6 - 20 mg/dL '10 16 12  '$ Creatinine 0.44 - 1.00 mg/dL 0.78 0.77 0.79  Sodium 135 - 145 mmol/L 141 139 139  Potassium 3.5 - 5.1 mmol/L 3.8 4.2 4.0  Chloride 98 - 111 mmol/L 107 108 106  CO2 22 - 32 mmol/L '25 23 24  '$ Calcium 8.9 - 10.3 mg/dL 8.5(L) 8.0(L) 8.4(L)  Total Protein 6.5 - 8.1 g/dL 6.5 6.1(L) 6.4(L)  Total Bilirubin 0.3 - 1.2 mg/dL 0.4 0.3 0.4  Alkaline Phos 38 - 126 U/L 84 92 87  AST 15 - 41 U/L '31 23 22  '$ ALT 0 - 44 U/L 47(H) 38 35     Lab Results  Component Value Date   WBC 4.6 01/17/2018   HGB 11.2 (L) 01/17/2018   HCT 32.8 (L) 01/17/2018   MCV 91.6 01/17/2018   PLT 257 01/17/2018   NEUTROABS 3.2 01/17/2018    ASSESSMENT & PLAN:  Malignant neoplasm of upper-inner quadrant of left breast in female, estrogen receptor positive ([place]) 08/19/2017:Left lumpectomy: IDC grade 2, 2.5 cm, DCIS intermediate grade, lymphovascular invasion identified, margins negative, 1/2 lymph nodes positive with extracapsular extension, ER 90%, PR 40%, HER-2 negative ratio 1.15, Ki-67 15% T2 N1a stage II a; Mammaprint high risk   Recommendation: 1. Adjuvant chemotherapy with dose dense Adriamycin and Cytoxan x4 followed by Taxol weekly x12, echocardiogram 09/04/17 was normal with EF of 55-60%, echo 10/29/17 normal with EF 60-65% 2. followed by adjuvant radiation therapy 3. Followed by adjuvant antiestrogen therapy  _______________________________________________________________________________________________ Current treatment: Cycle 10 Taxol Taxol toxicities: 1.  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  Otherwise tolerating it well. 2. fatigue: Related to chemo.  She wants to get the port out as soon as she finishes with chemo.  She may also consider having oophorectomy  between chemo and radiation.  I sent a message to [place].  I would also request Dr.Taavon to see if he can remove the port. We will try to obtain and radiation oncology consultation on the last day for chemotherapy.  Surveillance: We briefly discussed that we may consider MRI surveillance in addition to mammograms.  She is very claustrophobic and may require sedation if the decide to do an MRI. Monitoring closely for toxicities Return to clinic weekly for toxicity checks    No orders of the defined types were placed in this encounter.  The patient has a good understanding of the overall plan. she agrees with it. she will call with any  problems that may develop before the next visit here.   Harriette Ohara, MD 01/17/18

## 2018-01-17 NOTE — Assessment & Plan Note (Signed)
08/19/2017:Left lumpectomy: IDC grade 2, 2.5 cm, DCIS intermediate grade, lymphovascular invasion identified, margins negative, 1/2 lymph nodes positive with extracapsular extension, ER 90%, PR 40%, HER-2 negative ratio 1.15, Ki-67 15% T2 N1a stage II a; Mammaprint high risk   Recommendation: 1. Adjuvant chemotherapy with dose dense Adriamycin and Cytoxan x4 followed by Taxol weekly x12, echocardiogram 09/04/17 was normal with EF of 55-60%, echo 10/29/17 normal with EF 60-65% 2. followed by adjuvant radiation therapy 3. Followed by adjuvant antiestrogen therapy  _______________________________________________________________________________________________ Current treatment: Cycle 10 Taxol Taxol toxicities: 1.  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  Otherwise tolerating it well.  She wants to get the port out as soon as she finishes with chemo. She may also consider having oophorectomy between chemo and radiation Monitoring closely for toxicities Return to clinic weekly for toxicity checks

## 2018-01-17 NOTE — Patient Instructions (Signed)
[facility] Discharge Instructions for Patients Receiving Chemotherapy  Today you received the following chemotherapy agents:  Taxol.  To help prevent nausea and vomiting after your treatment, we encourage you to take your nausea medication as directed.   If you develop nausea and vomiting that is not controlled by your nausea medication, call the clinic.   BELOW ARE SYMPTOMS THAT SHOULD BE REPORTED IMMEDIATELY:  *FEVER GREATER THAN 100.5 F  *CHILLS WITH OR WITHOUT FEVER  NAUSEA AND VOMITING THAT IS NOT CONTROLLED WITH YOUR NAUSEA MEDICATION  *UNUSUAL SHORTNESS OF BREATH  *UNUSUAL BRUISING OR BLEEDING  TENDERNESS IN MOUTH AND THROAT WITH OR WITHOUT PRESENCE OF ULCERS  *URINARY PROBLEMS  *BOWEL PROBLEMS  UNUSUAL RASH Items with * indicate a potential emergency and should be followed up as soon as possible.  Feel free to call the clinic should you have any questions or concerns. The clinic phone number is [phone].  Please show the CHEMO ALERT CARD at check-in to the Emergency Department and triage nurse.   

## 2018-01-20 ENCOUNTER — Encounter: Payer: Self-pay | Admitting: Radiation Oncology

## 2018-01-24 ENCOUNTER — Inpatient Hospital Stay: Payer: BLUE CROSS/BLUE SHIELD

## 2018-01-24 VITALS — BP 145/93 | HR 97 | Temp 98.4°F | Resp 18

## 2018-01-24 DIAGNOSIS — Z79899 Other long term (current) drug therapy: Secondary | ICD-10-CM | POA: Diagnosis not present

## 2018-01-24 DIAGNOSIS — Z5111 Encounter for antineoplastic chemotherapy: Secondary | ICD-10-CM | POA: Diagnosis not present

## 2018-01-24 DIAGNOSIS — C50212 Malignant neoplasm of upper-inner quadrant of left female breast: Secondary | ICD-10-CM | POA: Diagnosis not present

## 2018-01-24 DIAGNOSIS — Z170 Estrogen receptor positive status [ER+]: Principal | ICD-10-CM

## 2018-01-24 DIAGNOSIS — G629 Polyneuropathy, unspecified: Secondary | ICD-10-CM | POA: Diagnosis not present

## 2018-01-24 DIAGNOSIS — R5383 Other fatigue: Secondary | ICD-10-CM | POA: Diagnosis not present

## 2018-01-24 DIAGNOSIS — Z95828 Presence of other vascular implants and grafts: Secondary | ICD-10-CM

## 2018-01-24 LAB — CMP (CANCER CENTER ONLY)
ALBUMIN: 3.6 g/dL (ref 3.5–5.0)
ALK PHOS: 72 U/L (ref 38–126)
ALT: 51 U/L — AB (ref 0–44)
ANION GAP: 9 (ref 5–15)
AST: 33 U/L (ref 15–41)
BUN: 10 mg/dL (ref 6–20)
CHLORIDE: 106 mmol/L (ref 98–111)
CO2: 26 mmol/L (ref 22–32)
Calcium: 8.9 mg/dL (ref 8.9–10.3)
Creatinine: 0.83 mg/dL (ref 0.44–1.00)
GFR, Est AFR Am: >60 mL/min (ref >60)
GFR, Estimated: >60 mL/min (ref >60)
GLUCOSE: 161 mg/dL — AB (ref 70–99)
POTASSIUM: 3.9 mmol/L (ref 3.5–5.1)
Sodium: 141 mmol/L (ref 135–145)
Total Bilirubin: 0.4 mg/dL (ref 0.3–1.2)
Total Protein: 6.3 g/dL — ABNORMAL LOW (ref 6.5–8.1)

## 2018-01-24 LAB — CBC WITH DIFFERENTIAL (CANCER CENTER ONLY)
Basophils Absolute: 0.1 10*3/uL (ref 0.0–0.1)
Basophils Relative: 1 %
EOS PCT: 2 %
Eosinophils Absolute: 0.1 10*3/uL (ref 0.0–0.5)
HEMATOCRIT: 34.7 % — AB (ref 34.8–46.6)
Hemoglobin: 11.8 g/dL (ref 11.6–15.9)
LYMPHS ABS: 1.3 10*3/uL (ref 0.9–3.3)
LYMPHS PCT: 21 %
MCH: 31.1 pg (ref 25.1–34.0)
MCHC: 34 g/dL (ref 31.5–36.0)
MCV: 91.6 fL (ref 79.5–101.0)
MONO ABS: 0.3 10*3/uL (ref 0.1–0.9)
Monocytes Relative: 4 %
NEUTROS ABS: 4.2 10*3/uL (ref 1.5–6.5)
Neutrophils Relative %: 72 %
Platelet Count: 286 10*3/uL (ref 145–400)
RBC: 3.79 MIL/uL (ref 3.70–5.45)
RDW: 14.1 % (ref 11.2–14.5)
WBC Count: 6 10*3/uL (ref 3.9–10.3)

## 2018-01-24 MED ORDER — FAMOTIDINE IN NACL 20-0.9 MG/50ML-% IV SOLN
INTRAVENOUS | Status: AC
  Filled 2018-01-24: qty 50

## 2018-01-24 MED ORDER — SODIUM CHLORIDE 0.9 % IV SOLN
20.0000 mg | Freq: Once | INTRAVENOUS | Status: AC
  Administered 2018-01-24: 20 mg via INTRAVENOUS
  Filled 2018-01-24: qty 2

## 2018-01-24 MED ORDER — FAMOTIDINE IN NACL 20-0.9 MG/50ML-% IV SOLN
20.0000 mg | Freq: Once | INTRAVENOUS | Status: AC
  Administered 2018-01-24: 20 mg via INTRAVENOUS

## 2018-01-24 MED ORDER — SODIUM CHLORIDE 0.9% FLUSH
10.0000 mL | INTRAVENOUS | Status: DC | PRN
  Administered 2018-01-24: 10 mL
  Filled 2018-01-24: qty 10

## 2018-01-24 MED ORDER — SODIUM CHLORIDE 0.9 % IV SOLN
Freq: Once | INTRAVENOUS | Status: AC
  Administered 2018-01-24: 10:00:00 via INTRAVENOUS
  Filled 2018-01-24: qty 250

## 2018-01-24 MED ORDER — SODIUM CHLORIDE 0.9 % IV SOLN
60.0000 mg/m2 | Freq: Once | INTRAVENOUS | Status: AC
  Administered 2018-01-24: 126 mg via INTRAVENOUS
  Filled 2018-01-24: qty 21

## 2018-01-24 MED ORDER — HEPARIN SOD (PORK) LOCK FLUSH 100 UNIT/ML IV SOLN
500.0000 [IU] | Freq: Once | INTRAVENOUS | Status: AC | PRN
  Administered 2018-01-24: 500 [IU]
  Filled 2018-01-24: qty 5

## 2018-01-24 MED ORDER — DIPHENHYDRAMINE HCL 50 MG/ML IJ SOLN
50.0000 mg | Freq: Once | INTRAMUSCULAR | Status: AC
  Administered 2018-01-24: 50 mg via INTRAVENOUS

## 2018-01-24 MED ORDER — DIPHENHYDRAMINE HCL 50 MG/ML IJ SOLN
INTRAMUSCULAR | Status: AC
  Filled 2018-01-24: qty 1

## 2018-01-24 NOTE — Patient Instructions (Signed)
[facility] Discharge Instructions for Patients Receiving Chemotherapy  Today you received the following chemotherapy agents:  Taxol.  To help prevent nausea and vomiting after your treatment, we encourage you to take your nausea medication as directed.   If you develop nausea and vomiting that is not controlled by your nausea medication, call the clinic.   BELOW ARE SYMPTOMS THAT SHOULD BE REPORTED IMMEDIATELY:  *FEVER GREATER THAN 100.5 F  *CHILLS WITH OR WITHOUT FEVER  NAUSEA AND VOMITING THAT IS NOT CONTROLLED WITH YOUR NAUSEA MEDICATION  *UNUSUAL SHORTNESS OF BREATH  *UNUSUAL BRUISING OR BLEEDING  TENDERNESS IN MOUTH AND THROAT WITH OR WITHOUT PRESENCE OF ULCERS  *URINARY PROBLEMS  *BOWEL PROBLEMS  UNUSUAL RASH Items with * indicate a potential emergency and should be followed up as soon as possible.  Feel free to call the clinic should you have any questions or concerns. The clinic phone number is [phone].  Please show the CHEMO ALERT CARD at check-in to the Emergency Department and triage nurse.   

## 2018-01-31 ENCOUNTER — Ambulatory Visit: Payer: BLUE CROSS/BLUE SHIELD | Admitting: Radiation Oncology

## 2018-01-31 ENCOUNTER — Inpatient Hospital Stay: Payer: BLUE CROSS/BLUE SHIELD | Attending: Hematology and Oncology

## 2018-01-31 ENCOUNTER — Inpatient Hospital Stay: Payer: BLUE CROSS/BLUE SHIELD

## 2018-01-31 ENCOUNTER — Inpatient Hospital Stay (HOSPITAL_BASED_OUTPATIENT_CLINIC_OR_DEPARTMENT_OTHER): Payer: BLUE CROSS/BLUE SHIELD | Admitting: Hematology and Oncology

## 2018-01-31 ENCOUNTER — Ambulatory Visit: Payer: BLUE CROSS/BLUE SHIELD

## 2018-01-31 ENCOUNTER — Encounter: Payer: Self-pay | Admitting: *Deleted

## 2018-01-31 ENCOUNTER — Ambulatory Visit: Payer: BLUE CROSS/BLUE SHIELD | Admitting: Urology

## 2018-01-31 DIAGNOSIS — R5383 Other fatigue: Secondary | ICD-10-CM

## 2018-01-31 DIAGNOSIS — Z95828 Presence of other vascular implants and grafts: Secondary | ICD-10-CM

## 2018-01-31 DIAGNOSIS — Z5111 Encounter for antineoplastic chemotherapy: Secondary | ICD-10-CM | POA: Insufficient documentation

## 2018-01-31 DIAGNOSIS — C50212 Malignant neoplasm of upper-inner quadrant of left female breast: Secondary | ICD-10-CM

## 2018-01-31 DIAGNOSIS — Z170 Estrogen receptor positive status [ER+]: Secondary | ICD-10-CM | POA: Insufficient documentation

## 2018-01-31 DIAGNOSIS — G629 Polyneuropathy, unspecified: Secondary | ICD-10-CM | POA: Diagnosis not present

## 2018-01-31 LAB — CBC WITH DIFFERENTIAL (CANCER CENTER ONLY)
BASOS ABS: 0.1 10*3/uL (ref 0.0–0.1)
Basophils Relative: 1 %
EOS ABS: 0.1 10*3/uL (ref 0.0–0.5)
EOS PCT: 2 %
HCT: 34.6 % — ABNORMAL LOW (ref 34.8–46.6)
HEMOGLOBIN: 11.7 g/dL (ref 11.6–15.9)
Lymphocytes Relative: 23 %
Lymphs Abs: 1.3 10*3/uL (ref 0.9–3.3)
MCH: 30.5 pg (ref 25.1–34.0)
MCHC: 33.8 g/dL (ref 31.5–36.0)
MCV: 90.3 fL (ref 79.5–101.0)
Monocytes Absolute: 0.3 10*3/uL (ref 0.1–0.9)
Monocytes Relative: 6 %
NEUTROS PCT: 68 %
Neutro Abs: 3.9 10*3/uL (ref 1.5–6.5)
PLATELETS: 234 10*3/uL (ref 145–400)
RBC: 3.83 MIL/uL (ref 3.70–5.45)
RDW: 14.2 % (ref 11.2–14.5)
WBC: 5.8 10*3/uL (ref 3.9–10.3)

## 2018-01-31 LAB — CMP (CANCER CENTER ONLY)
ALBUMIN: 3.7 g/dL (ref 3.5–5.0)
ALT: 45 U/L — ABNORMAL HIGH (ref 0–44)
AST: 32 U/L (ref 15–41)
Alkaline Phosphatase: 76 U/L (ref 38–126)
Anion gap: 10 (ref 5–15)
BUN: 13 mg/dL (ref 6–20)
CALCIUM: 8.8 mg/dL — AB (ref 8.9–10.3)
CO2: 24 mmol/L (ref 22–32)
CREATININE: 0.83 mg/dL (ref 0.44–1.00)
Chloride: 106 mmol/L (ref 98–111)
GFR, Est AFR Am: >60 mL/min (ref >60)
GFR, Estimated: >60 mL/min (ref >60)
Glucose, Bld: 141 mg/dL — ABNORMAL HIGH (ref 70–99)
Potassium: 3.9 mmol/L (ref 3.5–5.1)
SODIUM: 140 mmol/L (ref 135–145)
Total Bilirubin: 0.5 mg/dL (ref 0.3–1.2)
Total Protein: 6.4 g/dL — ABNORMAL LOW (ref 6.5–8.1)

## 2018-01-31 MED ORDER — DIPHENHYDRAMINE HCL 50 MG/ML IJ SOLN
INTRAMUSCULAR | Status: AC
  Filled 2018-01-31: qty 1

## 2018-01-31 MED ORDER — SODIUM CHLORIDE 0.9% FLUSH
10.0000 mL | INTRAVENOUS | Status: DC | PRN
  Administered 2018-01-31: 10 mL
  Filled 2018-01-31: qty 10

## 2018-01-31 MED ORDER — FAMOTIDINE IN NACL 20-0.9 MG/50ML-% IV SOLN
20.0000 mg | Freq: Once | INTRAVENOUS | Status: AC
  Administered 2018-01-31: 20 mg via INTRAVENOUS

## 2018-01-31 MED ORDER — FAMOTIDINE IN NACL 20-0.9 MG/50ML-% IV SOLN
INTRAVENOUS | Status: AC
  Filled 2018-01-31: qty 50

## 2018-01-31 MED ORDER — HEPARIN SOD (PORK) LOCK FLUSH 100 UNIT/ML IV SOLN
500.0000 [IU] | Freq: Once | INTRAVENOUS | Status: AC | PRN
  Administered 2018-01-31: 500 [IU]
  Filled 2018-01-31: qty 5

## 2018-01-31 MED ORDER — SODIUM CHLORIDE 0.9 % IV SOLN
20.0000 mg | Freq: Once | INTRAVENOUS | Status: AC
  Administered 2018-01-31: 20 mg via INTRAVENOUS
  Filled 2018-01-31: qty 2

## 2018-01-31 MED ORDER — SODIUM CHLORIDE 0.9 % IV SOLN
Freq: Once | INTRAVENOUS | Status: AC
  Administered 2018-01-31: 10:00:00 via INTRAVENOUS
  Filled 2018-01-31: qty 250

## 2018-01-31 MED ORDER — DIPHENHYDRAMINE HCL 50 MG/ML IJ SOLN
50.0000 mg | Freq: Once | INTRAMUSCULAR | Status: AC
  Administered 2018-01-31: 50 mg via INTRAVENOUS

## 2018-01-31 MED ORDER — SODIUM CHLORIDE 0.9 % IV SOLN
60.0000 mg/m2 | Freq: Once | INTRAVENOUS | Status: AC
  Administered 2018-01-31: 126 mg via INTRAVENOUS
  Filled 2018-01-31: qty 21

## 2018-01-31 NOTE — Patient Instructions (Signed)
[facility] Discharge Instructions for Patients Receiving Chemotherapy  Today you received the following chemotherapy agents:  Taxol.  To help prevent nausea and vomiting after your treatment, we encourage you to take your nausea medication as directed.   If you develop nausea and vomiting that is not controlled by your nausea medication, call the clinic.   BELOW ARE SYMPTOMS THAT SHOULD BE REPORTED IMMEDIATELY:  *FEVER GREATER THAN 100.5 F  *CHILLS WITH OR WITHOUT FEVER  NAUSEA AND VOMITING THAT IS NOT CONTROLLED WITH YOUR NAUSEA MEDICATION  *UNUSUAL SHORTNESS OF BREATH  *UNUSUAL BRUISING OR BLEEDING  TENDERNESS IN MOUTH AND THROAT WITH OR WITHOUT PRESENCE OF ULCERS  *URINARY PROBLEMS  *BOWEL PROBLEMS  UNUSUAL RASH Items with * indicate a potential emergency and should be followed up as soon as possible.  Feel free to call the clinic should you have any questions or concerns. The clinic phone number is [phone].  Please show the CHEMO ALERT CARD at check-in to the Emergency Department and triage nurse.   

## 2018-01-31 NOTE — Assessment & Plan Note (Signed)
08/19/2017:Left lumpectomy: IDC grade 2, 2.5 cm, DCIS intermediate grade, lymphovascular invasion identified, margins negative, 1/2 lymph nodes positive with extracapsular extension, ER 90%, PR 40%, HER-2 negative ratio 1.15, Ki-67 15% T2 N1a stage II a; Mammaprint high risk   Recommendation: 1. Adjuvant chemotherapy with dose dense Adriamycin and Cytoxan x4 followed by Taxol weekly x12, echocardiogram 09/04/17 was normal with EF of 55-60%, echo 10/29/17 normal with EF 60-65% 2. followed by adjuvant radiation therapy 3. Followed by adjuvant antiestrogen therapy  _______________________________________________________________________________________________ Current treatment: Cycle 12 Taxol Taxol toxicities: 1.  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  Otherwise tolerating it well. 2. fatigue: Related to chemo.  She wants to get the port out as soon as she finishes with chemo.  She may also consider having oophorectomy between chemo and radiation.  I sent a message to [place]. She has radiation oncology consultation today.  Surveillance: We briefly discussed that we may consider MRI surveillance in addition to mammograms.  She is very claustrophobic and may require sedation if the decide to do an MRI.  Return to clinic to its end of radiation therapy.

## 2018-01-31 NOTE — Progress Notes (Signed)
Patient Care Team: Jonathon Jordan, MD as PCP - General (Family Medicine)  DIAGNOSIS:  Encounter Diagnosis  Name Primary?  . Malignant neoplasm of upper-inner quadrant of left breast in female, estrogen receptor positive ([place])     SUMMARY OF ONCOLOGIC HISTORY:   Malignant neoplasm of upper-inner quadrant of left breast in female, estrogen receptor positive ([place])   07/24/2017 Initial Diagnosis    Left breast palpable lump upper inner quadrant 11 o'clock position: Ill-defined 1.6 cm mass, axilla ultrasound negative, ultrasound-guided biopsy: Grade 2 IDC ER 90%, PR 40%, Ki-67 15%, HER-2 negative    08/09/2017 Genetic Testing    Negative genetic testing common hereditary cancer panel.  The Hereditary Gene Panel offered by [facility] includes sequencing and/or deletion duplication testing of the following 47 genes: APC, ATM, AXIN2, BARD1, BMPR1A, BRCA1, BRCA2, BRIP1, CDH1, CDK4, CDKN2A (p14ARF), CDKN2A (p16INK4a), CHEK2, CTNNA1, DICER1, EPCAM (Deletion/duplication testing only), GREM1 (promoter region deletion/duplication testing only), KIT, MEN1, MLH1, MSH2, MSH3, MSH6, MUTYH, NBN, NF1, NHTL1, PALB2, PDGFRA, PMS2, POLD1, POLE, PTEN, RAD50, RAD51C, RAD51D, SDHB, SDHC, SDHD, SMAD4, SMARCA4. STK11, TP53, TSC1, TSC2, and VHL.  The following genes were evaluated for sequence changes only: SDHA and HOXB13 c.251G>A variant only. The report date is August 07, 2017.    08/16/2017 Surgery    Left lumpectomy: IDC grade 2, 2.5 cm, DCIS intermediate grade, lymphovascular invasion identified, margins negative, 1/2 lymph nodes positive with extracapsular extension, ER 90%, PR 40%, HER-2 negative ratio 1.15, Ki-67 15% T2 N1a stage II a; Mammaprint high risk     09/12/2017 - 01/31/2018 Chemotherapy    Adjuvant chemotherapy with dose dense Adriamycin and Cytoxan x4 followed by Taxol weekly x12      CHIEF COMPLIANT: Cycle 12 Taxol  INTERVAL HISTORY: MINDA FAAS is a 37 year old with above-mentioned his left  breast cancer underwent lumpectomy and is currently adjuvant chemotherapy.  She is here for her last and final treatment.  She is extremely happy about it.  REVIEW OF SYSTEMS:   Constitutional: Denies fevers, chills or abnormal weight loss Eyes: Denies blurriness of vision Ears, nose, mouth, throat, and face: Denies mucositis or sore throat Respiratory: Denies cough, dyspnea or wheezes Cardiovascular: Denies palpitation, chest discomfort Gastrointestinal:  Denies nausea, heartburn or change in bowel habits Skin: Denies abnormal skin rashes Lymphatics: Denies new lymphadenopathy or easy bruising Neurological:Denies numbness, tingling or new weaknesses Behavioral/Psych: Mood is stable, no new changes  Extremities: No lower extremity edema Breast:  denies any pain or lumps or nodules in either breasts All other systems were reviewed with the patient and are negative.  I have reviewed the past medical history, past surgical history, social history and family history with the patient and they are unchanged from previous note.  ALLERGIES:  has No Known Allergies.  MEDICATIONS:  Current Outpatient Medications  Medication Sig Dispense Refill  . escitalopram (LEXAPRO) 20 MG tablet Take 20 mg by mouth daily with supper.     . lidocaine-prilocaine (EMLA) cream Apply to affected area once 30 g 3   No current facility-administered medications for this visit.    Facility-Administered Medications Ordered in Other Visits  Medication Dose Route Frequency Provider Last Rate Last Dose  . heparin lock flush 100 unit/mL  500 Units Intracatheter Once PRN Nicholas Lose, MD      . sodium chloride flush (NS) 0.9 % injection 10 mL  10 mL Intracatheter PRN Nicholas Lose, MD   10 mL at 11/15/17 1449  . sodium chloride flush (NS) 0.9 % injection  10 mL  10 mL Intracatheter PRN Nicholas Lose, MD      . sodium chloride flush (NS) 0.9 % injection 10 mL  10 mL Intracatheter PRN Nicholas Lose, MD   10 mL at 12/13/17  1500    PHYSICAL EXAMINATION: ECOG PERFORMANCE STATUS: 1 - Symptomatic but completely ambulatory  Vitals:   01/31/18 0911  BP: 136/86  Pulse: 89  Resp: 18  Temp: 98.6 F (37 C)  SpO2: 100%   Filed Weights   01/31/18 0911  Weight: 257 lb 3.2 oz (116.7 kg)    GENERAL:alert, no distress and comfortable SKIN: skin color, texture, turgor are normal, no rashes or significant lesions EYES: normal, Conjunctiva are pink and non-injected, sclera clear OROPHARYNX:no exudate, no erythema and lips, buccal mucosa, and tongue normal  NECK: supple, thyroid normal size, non-tender, without nodularity LYMPH:  no palpable lymphadenopathy in the cervical, axillary or inguinal LUNGS: clear to auscultation and percussion with normal breathing effort HEART: regular rate & rhythm and no murmurs and no lower extremity edema ABDOMEN:abdomen soft, non-tender and normal bowel sounds MUSCULOSKELETAL:no cyanosis of digits and no clubbing  NEURO: alert & oriented x 3 with fluent speech, no focal motor/sensory deficits EXTREMITIES: No lower extremity edema   LABORATORY DATA:  I have reviewed the data as listed CMP Latest Ref Rng & Units 01/31/2018 01/24/2018 01/17/2018  Glucose 70 - 99 mg/dL 141(H) 161(H) 133(H)  BUN 6 - 20 mg/dL '13 10 11  '$ Creatinine 0.44 - 1.00 mg/dL 0.83 0.83 0.79  Sodium 135 - 145 mmol/L 140 141 142  Potassium 3.5 - 5.1 mmol/L 3.9 3.9 4.1  Chloride 98 - 111 mmol/L 106 106 107  CO2 22 - 32 mmol/L '24 26 25  '$ Calcium 8.9 - 10.3 mg/dL 8.8(L) 8.9 8.8(L)  Total Protein 6.5 - 8.1 g/dL 6.4(L) 6.3(L) 6.3(L)  Total Bilirubin 0.3 - 1.2 mg/dL 0.5 0.4 0.3  Alkaline Phos 38 - 126 U/L 76 72 87  AST 15 - 41 U/L 32 33 31  ALT 0 - 44 U/L 45(H) 51(H) 51(H)    Lab Results  Component Value Date   WBC 5.8 01/31/2018   HGB 11.7 01/31/2018   HCT 34.6 (L) 01/31/2018   MCV 90.3 01/31/2018   PLT 234 01/31/2018   NEUTROABS 3.9 01/31/2018    ASSESSMENT & PLAN:  Malignant neoplasm of upper-inner  quadrant of left breast in female, estrogen receptor positive ([facility]) 08/19/2017:Left lumpectomy: IDC grade 2, 2.5 cm, DCIS intermediate grade, lymphovascular invasion identified, margins negative, 1/2 lymph nodes positive with extracapsular extension, ER 90%, PR 40%, HER-2 negative ratio 1.15, Ki-67 15% T2 N1a stage II a; Mammaprint high risk   Recommendation: 1. Adjuvant chemotherapy with dose dense Adriamycin and Cytoxan x4 followed by Taxol weekly x12, echocardiogram 09/04/17 was normal with EF of 55-60%, echo 10/29/17 normal with EF 60-65% 2. followed by adjuvant radiation therapy 3. Followed by adjuvant antiestrogen therapy; will enroll the patient in [facility] clinical trial _______________________________________________________________________________________________ Current treatment: Cycle 12 Taxol Taxol toxicities: 1.  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  Otherwise tolerating it well. 2. fatigue: Related to chemo.  She wants to get the port out as soon as she finishes with chemo.  She may also consider having oophorectomy between chemo and radiation.  I sent a message to [place]. She has radiation oncology consultation today.  Surveillance: We briefly discussed that we may consider MRI surveillance in addition to mammograms.  She is very claustrophobic and may require sedation if the  decide to do an MRI.  Return to clinic to its end of radiation therapy.    No orders of the defined types were placed in this encounter.  The patient has a good understanding of the overall plan. she agrees with it. she will call with any problems that may develop before the next visit here.   Harriette Ohara, MD 01/31/18

## 2018-02-03 NOTE — Progress Notes (Signed)
Location of Breast Cancer:Upper-inner quadrant of left breast in female  Histology per Pathology Report: AL DIAGNOSIS Diagnosis 07-24-17 Breast, left, needle core biopsy - INVASIVE DUCTAL CARCINOMA. - SEE COMMENT. Microscopic Comment The carcinoma appears grade II. A breast prognostic profile will be performed and the results reported separately. The results were called to [facility] on 07/25/17. (JBK:gt, 07/25/17) Enid Cutter MD  Receptor Status: ER(90% +), PR (40 % +), Her2-neu (- ratio 1.15,), Ki-67,(15 %)  Did patient present with symptoms (if so, please note symptoms) or was this found on screening mammography?: Left breast palpable lump upper inner quadrant by Mrs. Galvis around Christmas 2018.  Past/Anticipated interventions by surgeon, if any: FINAL DIAGNOSIS Diagnosis 08-23-17 Dr. Arnoldo Hooker [place] 1. Breast, Mammoplasty, left - FIBROCYSTIC CHANGES - NO MALIGNANCY IDENTIFIED 2. Breast, excision, left anterior lumpectomy cavity - RESIDUAL DUCTAL CARCINOMA IN SITU, INTERMEDIATE GRADE - PREVIOUS SURGICAL SITE CHANGES - MARGINS UNINVOLVED BY CARCINOMA - SEE COMMENT 3. Breast, Mammoplasty, right - FIBROCYSTIC CHANGES - NO MALIGNANCY IDENTIFIED Microscopic Comment 2. Immunohistochemistry was performed to assess for an invasive component (SMM, calponin, and p63). A definitive residual invasive component is not seen. The ductal carcinoma in situ is positive for E-cadherin and lacks cytokeratin 5/6 within the epithelial proliferation. Dr. Lyndon Code has reviewed the case and agrees with the diagnosis. The results were called to Thressa Sheller MD Pathologist, Electronic Signature (Case signed 08/28/2017) Specimen Gross and Clinical Information  FINAL DIAGNOSIS Diagnosis 08-16-17 Dr. Marland Kitchen [place] 1. Breast, lumpectomy, left - INVASIVE DUCTAL CARCINOMA, GRADE II/III, SPANNING 2.5 CM. - DUCTAL CARCINOMA IN SITU, INTERMEDIATE GRADE. - LYMPHOVASCULAR INVASION IS  IDENTIFIED. - LOBULAR NEOPLASIA (ATYPICAL LOBULAR HYPERPLASIA). - THE SURGICAL RESECTION MARGINS ARE NEGATIVE FOR CARCINOMA. - SEE ONCOLOGY TABLE BELOW. 2. Lymph node, sentinel, biopsy, left axillary #1 - METASTATIC CARCINOMA IN 1 OF 1 LYMPH NODE (1/1), WITH EXTRACAPSULAR EXTENSION. 3. Lymph node, sentinel, biopsy, left axillary #2 - THERE IS NO EVIDENCE OF CARCINOMA IN 1 OF 1 LYMPH NODE (0/1). Microscopic Comment 1. BREAST, INVASIVE TUMOR Procedure: Seed localized lumpectomy.   Receptor Status: ER(90% +), PR (40 % +), Her2-neu (- ratio 1.15,), Ki-67,(15 %)  Past/Anticipated interventions by medical oncology, if any:Dr. Lindi Adie   Chemotherapy 09-12-17-01-31-18 Adjuvant chemotherapy with dose dense Adriamycin and Cytoxan x4 followed by Taxol weekly x12  08-16-17 Mammaprint High Risk  08-09-17   Genetic Testing  negative genetic testing common hereditary cancer panel.  1. Adjuvant chemotherapy with dose dense Adriamycin and Cytoxan x4 followed by Taxol weekly x12, echocardiogram 09/04/17 was normal with EF of 55-60%, echo 10/29/17 normal with EF 60-65% 2. followed by adjuvant radiation therapy 3. Followed by adjuvant antiestrogen therapy; will enroll the patient in [facility] clinical trial  Lymphedema issues, if any:No  ROM of left arm good.  Had PT 4-t times during chemotherapy and will see PT again after radiation. Skin to left arm looks good no bruising incision areas clean and dry. Follow up appointment to see Dr. Excell Seltzer 09-11-17 when  she had her Port-A-cath inserted.  Pain issues, if any: No   SAFETY ISSUES:No  Prior radiation? :No  Pacemaker/ICD? :No  Possible current pregnancy?:No  Is the patient on methotrexate? :No  Menarche 12  G2 P2 BC yes LMP 10-09-17 Menopause N/A HRT N/A  Current Complaints / other details:  Family history of brerast cancer,shoulder blade cancer Wt Readings from Last 3 Encounters:  02/13/18 256 lb 6.4 oz (116.3 kg)  01/31/18 257  lb 3.2 oz (116.7 kg)  01/17/18  251 lb 4.8 oz (114 kg)  BP 110/66 (BP Location: Right Arm, Patient Position: Sitting)   Pulse 94   Temp 98.3 F (36.8 C) (Oral)   Resp 20   Ht 5' 6" (1.676 m)   Wt 256 lb 6.4 oz (116.3 kg)   SpO2 99%   BMI 41.38 kg/m   Georgena Spurling, RN 02/03/2018,3:54 PM

## 2018-02-04 DIAGNOSIS — Z853 Personal history of malignant neoplasm of breast: Secondary | ICD-10-CM | POA: Diagnosis not present

## 2018-02-06 ENCOUNTER — Institutional Professional Consult (permissible substitution): Payer: Self-pay | Admitting: Radiation Oncology

## 2018-02-07 ENCOUNTER — Ambulatory Visit: Payer: BLUE CROSS/BLUE SHIELD | Admitting: Radiation Oncology

## 2018-02-13 ENCOUNTER — Ambulatory Visit
Admission: RE | Admit: 2018-02-13 | Discharge: 2018-02-13 | Disposition: A | Discharge status: Home / Self Care | Payer: BLUE CROSS/BLUE SHIELD | Source: Ambulatory Visit | Attending: Radiation Oncology | Admitting: Radiation Oncology

## 2018-02-13 ENCOUNTER — Encounter: Payer: Self-pay | Admitting: Radiation Oncology

## 2018-02-13 ENCOUNTER — Other Ambulatory Visit: Payer: Self-pay

## 2018-02-13 VITALS — BP 110/66 | HR 94 | Temp 98.3°F | Resp 20 | Ht 66.0 in | Wt 256.4 lb

## 2018-02-13 DIAGNOSIS — Z170 Estrogen receptor positive status [ER+]: Secondary | ICD-10-CM | POA: Diagnosis not present

## 2018-02-13 DIAGNOSIS — Z9889 Other specified postprocedural states: Secondary | ICD-10-CM | POA: Diagnosis not present

## 2018-02-13 DIAGNOSIS — Z9221 Personal history of antineoplastic chemotherapy: Secondary | ICD-10-CM | POA: Diagnosis not present

## 2018-02-13 DIAGNOSIS — C50212 Malignant neoplasm of upper-inner quadrant of left female breast: Secondary | ICD-10-CM

## 2018-02-13 DIAGNOSIS — Z510 Encounter for antineoplastic radiation therapy: Secondary | ICD-10-CM | POA: Insufficient documentation

## 2018-02-13 NOTE — Addendum Note (Signed)
Encounter addended by: Malena Edman, RN on: 02/13/2018 2:08 PM  Actions taken: Charge Capture section accepted

## 2018-02-13 NOTE — Progress Notes (Signed)
Radiation Oncology         ([phone]) [phone] ________________________________  Name: HAZELY [name]        MRN: [id number]  Date of Service: 02/13/2018 DOB: 03/07/1981  CC:Jonathon Jordan, MD  Nicholas Lose, MD     REFERRING PHYSICIAN: Nicholas Lose, MD   DIAGNOSIS: The encounter diagnosis was Malignant neoplasm of upper-inner quadrant of left breast in female, estrogen receptor positive ([place]).   HISTORY OF PRESENT ILLNESS: [name] is a 37 y.o. female originally seen in the multidisciplinary breast clinic for a new diagnosis of left breast cancer. The patient was noted to have a palpable mass in the left breast. She proceeded with diagnostic imaging and this revealed an ill defined mass at 11:00. Her axilla was negative for adenopathy. A biopsy on 07/24/17 revealed a grade 2, invasive ductal carcinoma, ER/PR positive, HER2 negative, Ki 67 of 15%. She subsequently underwent lumpectomy on 08/16/2017 which revealed a grade 2, 2.5 cm invasive ductal carcinoma with intermediate grade DCIS.  Within the specimen there was LVSI, and her margins were negative however her to sampled lymph nodes were assessed and 1 of them was positive for disease with extracapsular extension. She had mammoplasty bilaterally on 08/23/17. The right breast specimen revealed fibrocystic changes. She had residual intermediate grade DCIS of the left breast along the anterior lumpectomy cavity, and her margins of this specimen were negative. She had a MammaPrint test performed which was high risk, and subsequently went on to receive chemotherapy.  She began this regimen on 09/12/2017 with dose dense Adriamycin and Cytoxan followed by weekly Taxol which she completed on 01/31/2018.  Of note her genetic testing was negative, but she has been contemplating risk reduction oophorectomy and Port-A-Cath removal prior to radiotherapy.  She comes today to revisit the discussion of adjuvant radiotherapy.   PREVIOUS RADIATION THERAPY:  No   PAST MEDICAL HISTORY:  Past Medical History:  Diagnosis Date  . Anxiety   . Breast cancer, left ([place]) 07/2017  . Family history of breast cancer   . Family history of colon cancer        PAST SURGICAL HISTORY: Past Surgical History:  Procedure Laterality Date  . BREAST LUMPECTOMY WITH RADIOACTIVE SEED AND SENTINEL LYMPH NODE BIOPSY Left 08/16/2017   Procedure: BREAST LUMPECTOMY WITH RADIOACTIVE SEED AND SENTINEL LYMPH NODE BIOPSY;  Surgeon: Excell Seltzer, MD;  Location: [facility];  Service: General;  Laterality: Left;  . BREAST LUMPECTOMY WITH SENTINEL LYMPH NODE BIOPSY Left 08/16/2017  . BREAST REDUCTION SURGERY Bilateral 08/23/2017   Procedure: LEFT ONCOPLASTIC REDUCTION, RIGHT BREAST REDUCTION;  Surgeon: Irene Limbo, MD;  Location: [facility];  Service: Plastics;  Laterality: Bilateral;  . CESAREAN SECTION  03/15/2012   Procedure: CESAREAN SECTION;  Surgeon: Lovenia Kim, MD;  Location: [facility];  Service: Obstetrics;  Laterality: N/A;  Primary cesarean section with delivery of baby girl at 51. Apgars 4/7.  Marland Kitchen CESAREAN SECTION WITH BILATERAL TUBAL LIGATION Bilateral 09/03/2015   Procedure: REPEAT CESAREAN SECTION WITH BILATERAL TUBAL LIGATION;  Surgeon: Brien Few, MD;  Location: [facility];  Service: Obstetrics;  Laterality: Bilateral;  EDD: 09/10/15  . CHOLECYSTECTOMY  12/07/2011   Procedure: LAPAROSCOPIC CHOLECYSTECTOMY;  Surgeon: Harl Bowie, MD;  Location: [facility];  Service: General;  Laterality: N/A;  . PORTACATH PLACEMENT Right 09/11/2017   Procedure: INSERTION PORT-A-CATH;  Surgeon: Excell Seltzer, MD;  Location: [facility];  Service: General;  Laterality: Right;  . [place] EXTRACTION  2002  FAMILY HISTORY:  Family History  Problem Relation Age of Onset  . Diabetes Mother   . Cancer Mother 25       chondrosarcoma  . Breast cancer Mother 43       DCIS vs Atypical Hyperplasia  . Breast cancer Other         Maternal Great Aunt; MGMs sister  . Breast cancer Other        Maternal Great Aunt  . Other Maternal Grandmother        d. from complications of a fall and breaking her hip  . Alcohol abuse Maternal Grandfather   . Colon cancer Paternal Grandmother      SOCIAL HISTORY:  reports that she quit smoking about 3 years ago. She smoked 0.00 packs per day. She has never used smokeless tobacco. She reports that she drinks alcohol. She reports that she has current or past drug history. Drug: Marijuana. The patient is married and lives in [place], and she works in an insurance setting. She has two daughters.   ALLERGIES: Patient has no known allergies.   MEDICATIONS:  Current Outpatient Medications  Medication Sig Dispense Refill  . escitalopram (LEXAPRO) 20 MG tablet Take 20 mg by mouth daily with supper.     . lidocaine-prilocaine (EMLA) cream Apply to affected area once 30 g 3   No current facility-administered medications for this encounter.    Facility-Administered Medications Ordered in Other Encounters  Medication Dose Route Frequency Provider Last Rate Last Dose  . heparin lock flush 100 unit/mL  500 Units Intracatheter Once PRN Nicholas Lose, MD      . sodium chloride flush (NS) 0.9 % injection 10 mL  10 mL Intracatheter PRN Nicholas Lose, MD   10 mL at 11/15/17 1449  . sodium chloride flush (NS) 0.9 % injection 10 mL  10 mL Intracatheter PRN Nicholas Lose, MD      . sodium chloride flush (NS) 0.9 % injection 10 mL  10 mL Intracatheter PRN Nicholas Lose, MD   10 mL at 12/13/17 1500     REVIEW OF SYSTEMS: On review of systems, the patient reports that she is doing well overall. She is pleased with her surgical outcome and reports she is not having any problems with neuropathy now but did have some intermittent symptoms. She did have mild fatigue related to  She denies any chest pain, shortness of breath, cough, fevers, chills, night sweats, unintended weight changes. She denies any  bowel or bladder disturbances, and denies abdominal pain, nausea or vomiting. She denies any new musculoskeletal or joint aches or pains. A complete review of systems is obtained and is otherwise negative.     PHYSICAL EXAM:  Wt Readings from Last 3 Encounters:  01/31/18 257 lb 3.2 oz (116.7 kg)  01/17/18 251 lb 4.8 oz (114 kg)  01/07/18 247 lb (112 kg)   Temp Readings from Last 3 Encounters:  01/31/18 98.6 F (37 C) (Oral)  01/24/18 98.4 F (36.9 C) (Oral)  01/17/18 98.5 F (36.9 C) (Oral)   BP Readings from Last 3 Encounters:  01/31/18 136/86  01/24/18 (!) 145/93  01/17/18 133/79   Pulse Readings from Last 3 Encounters:  01/31/18 89  01/24/18 97  01/17/18 89     In general this is a well appearing caucasian female in no acute distress. She is alert and oriented x4 and appropriate throughout the examination. HEENT reveals that the patient is normocephalic, atraumatic. EOMs are intact. Cardiopulmonary assessment is negative for acute  distress and she exhibits normal effort. The left breast reveals a well healed key hole incision site and no erythema or fullness.   ECOG = 0  0 - Asymptomatic (Fully active, able to carry on all predisease activities without restriction)  1 - Symptomatic but completely ambulatory (Restricted in physically strenuous activity but ambulatory and able to carry out work of a light or sedentary nature. For example, light housework, office work)  2 - Symptomatic, <50% in bed during the day (Ambulatory and capable of all self care but unable to carry out any work activities. Up and about more than 50% of waking hours)  3 - Symptomatic, >50% in bed, but not bedbound (Capable of only limited self-care, confined to bed or chair 50% or more of waking hours)  4 - Bedbound (Completely disabled. Cannot carry on any self-care. Totally confined to bed or chair)  5 - Death   Eustace Pen MM, Creech RH, Tormey DC, et al. [phone]). "Toxicity and response criteria of  the [facility]". [place] Oncol. 5 (6): 649-55    LABORATORY DATA:  Lab Results  Component Value Date   WBC 5.8 01/31/2018   HGB 11.7 01/31/2018   HCT 34.6 (L) 01/31/2018   MCV 90.3 01/31/2018   PLT 234 01/31/2018   Lab Results  Component Value Date   NA 140 01/31/2018   K 3.9 01/31/2018   CL 106 01/31/2018   CO2 24 01/31/2018   Lab Results  Component Value Date   ALT 45 (H) 01/31/2018   AST 32 01/31/2018   ALKPHOS 76 01/31/2018   BILITOT 0.5 01/31/2018      RADIOGRAPHY: No results found.     IMPRESSION/PLAN: 1. Stage IB, pT2N1aM0, grade 2, ER/PR positive invasive ductal carcinoma of the left breast. Dr. Lisbeth Renshaw discusses the final pathology findings and reviews the nature of invasive breast disease. The patient is doing well since completion of her systemic regimen.  She is ready to proceed with radiotherapy, and is also contemplating her risk reduction surgery and removal of her Port-A-Cath. We discussed she could have her PAC removed during radiotherapy as it is on the contralateral chest wall from where we will be treating. She will proceed with oophorectomy in December. We discussed the risks, benefits, short, and long term effects of radiotherapy, and the patient is interested in proceeding, and we detailed that we would treat the breast and regional nodes. Dr. Lisbeth Renshaw discusses the delivery and logistics of radiotherapy and anticipates a course of 6 1/2 weeks with deep inspiration breath hold technique. Written consent is obtained and placed in the chart, a copy was provided to the patient. She will simulate this morning after her appointment with [place]. 2. Contraception and ovarian supression. The patient is counseled on the importance to avoid pregnancy during radiotherapy treatments. She has had prior tubal ligation and will have BSO for risk reduction at the end of the year.  In a visit lasting 25 minutes, greater than 50% of the time was spent face  to face discussing her case, and coordinating the patient's care.   The above documentation reflects my direct findings during this shared patient visit. Please see the separate note by Dr. Lisbeth Renshaw on this date for the remainder of the patient's plan of care.    Carola Rhine, PAC

## 2018-02-17 ENCOUNTER — Telehealth: Payer: Self-pay | Admitting: Hematology and Oncology

## 2018-02-17 NOTE — Telephone Encounter (Signed)
Scheduled appt per 9/23 sch message - sent reminder letter in the mail with appt date and time.

## 2018-02-19 NOTE — Progress Notes (Signed)
  Radiation Oncology         [phone] ________________________________  Name: JIZEL CHEEKS MRN: [id number]  Date: 02/13/2018  DOB: [birth year]  Optical Surface Tracking Plan:  Since intensity modulated radiotherapy (IMRT) and 3D conformal radiation treatment methods are predicated on accurate and precise positioning for treatment, intrafraction motion monitoring is medically necessary to ensure accurate and safe treatment delivery.  The ability to quantify intrafraction motion without excessive ionizing radiation dose can only be performed with optical surface tracking. Accordingly, surface imaging offers the opportunity to obtain 3D measurements of patient position throughout IMRT and 3D treatments without excessive radiation exposure.  I am ordering optical surface tracking for this patient's upcoming course of radiotherapy. ________________________________  Kyung Rudd, MD 02/19/2018 11:43 AM    Reference:   Ursula Alert, J, et al. Surface imaging-based analysis of intrafraction motion for breast radiotherapy patients.Journal of [place], n. 6, nov. 2014. ISSN [id number].   Available at: <http://www.jacmp.org/index.php/jacmp/article/view/4957>.

## 2018-02-19 NOTE — Progress Notes (Signed)
  Radiation Oncology         [phone] ________________________________  Name: [name] MRN: [id number]  Date: 02/13/2018  DOB: 1980/08/02  Diagnosis DIAGNOSIS:     ICD-10-CM   1. Malignant neoplasm of upper-inner quadrant of left breast in female, estrogen receptor positive ([place]) C50.212    Z17.0      SIMULATION AND TREATMENT PLANNING NOTE  The patient presented for simulation prior to beginning her course of radiation treatment for her diagnosis of left-sided breast cancer. The patient was placed in a supine position on a breast board. A customized vac-lock bag was also constructed and this complex treatment device will be used on a daily basis during her treatment. In this fashion, a CT scan was obtained through the chest area and an isocenter was placed near the chest wall at the upper aspect of the right chest. A breath-hold technique has also been evaluated to determine if this significantly improves the spatial relationship between the target region and the heart. Based on this analysis, a breath-hold technique has been ordered for the patient's treatment.  The patient will be planned to receive a course of radiation initially to a dose of 50.4 gray. This will consist of a 4 field technique targeting the left breast as well as the supraclavicular region. Therefore 2 customized medial and lateral tangent fields have been created targeting the chest wall, and also 2 additional customized fields have been designed to treat the supraclavicular region both with a left supraclavicular field and a left posterior axillary boost field. A forward planning/reduced field technique will also be evaluated to determine if this significantly improves the dose homogeneity of the overall plan. Therefore, additional customized blocks/fields may be necessary.  This initial treatment will be accomplished at 1.8 gray per fraction.   The initial plan will consist of a 3-D conformal technique. The target  volume/scar, heart and lungs have been contoured and dose volume histograms of each of these structures will be evaluated as part of the 3-D conformal treatment planning process.   It is anticipated that the patient will then receive a 10 gray boost to the surgical scar. This will be accomplished at 2 gray per fraction. The final anticipated total dose therefore will correspond to 60.4 gray.   Special treatment procedure was performed today due to the extra time and effort required by myself to plan and prepare this patient for deep inspiration breath hold technique.  I have determined cardiac sparing to be of benefit to this patient to prevent long term cardiac damage due to radiation of the heart.  Bellows were placed on the patient's abdomen. To facilitate cardiac sparing, the patient was coached by the radiation therapists on breath hold techniques and breathing practice was performed. Practice waveforms were obtained. The patient was then scanned while maintaining breath hold in the treatment position.  This image was then transferred over to the imaging specialist. The imaging specialist then created a fusion of the free breathing and breath hold scans using the chest wall as the stable structure. I personally reviewed the fusion in axial, coronal and sagittal image planes.  Excellent cardiac sparing was obtained.  I felt the patient is an appropriate candidate for breath hold and the patient will be treated as such.  The image fusion was then reviewed with the patient to reinforce the necessity of reproducible breath hold.     _______________________________   Jodelle Gross, MD, PhD

## 2018-02-21 DIAGNOSIS — Z170 Estrogen receptor positive status [ER+]: Secondary | ICD-10-CM | POA: Diagnosis not present

## 2018-02-21 DIAGNOSIS — Z510 Encounter for antineoplastic radiation therapy: Secondary | ICD-10-CM | POA: Diagnosis not present

## 2018-02-21 DIAGNOSIS — C50212 Malignant neoplasm of upper-inner quadrant of left female breast: Secondary | ICD-10-CM | POA: Diagnosis not present

## 2018-02-24 ENCOUNTER — Ambulatory Visit
Admission: RE | Admit: 2018-02-24 | Discharge: 2018-02-24 | Disposition: A | Discharge status: Home / Self Care | Payer: BLUE CROSS/BLUE SHIELD | Source: Ambulatory Visit | Attending: Radiation Oncology | Admitting: Radiation Oncology

## 2018-02-24 DIAGNOSIS — Z170 Estrogen receptor positive status [ER+]: Secondary | ICD-10-CM | POA: Diagnosis not present

## 2018-02-24 DIAGNOSIS — Z510 Encounter for antineoplastic radiation therapy: Secondary | ICD-10-CM | POA: Diagnosis not present

## 2018-02-24 DIAGNOSIS — C50212 Malignant neoplasm of upper-inner quadrant of left female breast: Secondary | ICD-10-CM | POA: Diagnosis not present

## 2018-02-24 MED ORDER — RADIAPLEXRX EX GEL
Freq: Once | CUTANEOUS | Status: AC
  Administered 2018-02-24: 13:00:00 via TOPICAL

## 2018-02-24 MED ORDER — ALRA NON-METALLIC DEODORANT (RAD-ONC)
1.0000 "application " | Freq: Once | TOPICAL | Status: AC
  Administered 2018-02-24: 1 via TOPICAL

## 2018-02-24 NOTE — Progress Notes (Signed)
Pt here for patient teaching.  Pt given Radiation and You booklet, skin care instructions, Alra deodorant and Radiaplex gel.  Reviewed areas of pertinence such as fatigue, hair loss, skin changes, breast tenderness and breast swelling . Pt able to give teach back of ,apply Radiaplex bid, avoid applying anything to skin within 4 hours of treatment, avoid wearing an under wire bra and to use an electric razor if they must shave. Pt demonstrated understanding, needs reinforcement, no evidence of learning, refused teaching and  of information given and will contact nursing with any questions or concerns.     Http://[email or web address]/treatmentinformation/whattoexpect/index

## 2018-02-25 ENCOUNTER — Ambulatory Visit
Admission: RE | Admit: 2018-02-25 | Discharge: 2018-02-25 | Disposition: A | Discharge status: Home / Self Care | Payer: BLUE CROSS/BLUE SHIELD | Source: Ambulatory Visit | Attending: Radiation Oncology | Admitting: Radiation Oncology

## 2018-02-25 DIAGNOSIS — C50212 Malignant neoplasm of upper-inner quadrant of left female breast: Secondary | ICD-10-CM | POA: Diagnosis not present

## 2018-02-25 DIAGNOSIS — Z510 Encounter for antineoplastic radiation therapy: Secondary | ICD-10-CM | POA: Diagnosis not present

## 2018-02-25 DIAGNOSIS — Z170 Estrogen receptor positive status [ER+]: Secondary | ICD-10-CM | POA: Diagnosis not present

## 2018-02-26 ENCOUNTER — Ambulatory Visit
Admission: RE | Admit: 2018-02-26 | Discharge: 2018-02-26 | Disposition: A | Discharge status: Home / Self Care | Payer: BLUE CROSS/BLUE SHIELD | Source: Ambulatory Visit | Attending: Radiation Oncology | Admitting: Radiation Oncology

## 2018-02-26 DIAGNOSIS — Z170 Estrogen receptor positive status [ER+]: Secondary | ICD-10-CM | POA: Diagnosis not present

## 2018-02-26 DIAGNOSIS — Z510 Encounter for antineoplastic radiation therapy: Secondary | ICD-10-CM | POA: Diagnosis not present

## 2018-02-26 DIAGNOSIS — C50212 Malignant neoplasm of upper-inner quadrant of left female breast: Secondary | ICD-10-CM | POA: Diagnosis not present

## 2018-02-27 ENCOUNTER — Ambulatory Visit
Admission: RE | Admit: 2018-02-27 | Discharge: 2018-02-27 | Disposition: A | Discharge status: Home / Self Care | Payer: BLUE CROSS/BLUE SHIELD | Source: Ambulatory Visit | Attending: Radiation Oncology | Admitting: Radiation Oncology

## 2018-02-27 DIAGNOSIS — Z170 Estrogen receptor positive status [ER+]: Secondary | ICD-10-CM | POA: Diagnosis not present

## 2018-02-27 DIAGNOSIS — Z510 Encounter for antineoplastic radiation therapy: Secondary | ICD-10-CM | POA: Diagnosis not present

## 2018-02-27 DIAGNOSIS — C50212 Malignant neoplasm of upper-inner quadrant of left female breast: Secondary | ICD-10-CM | POA: Diagnosis not present

## 2018-02-28 ENCOUNTER — Ambulatory Visit
Admission: RE | Admit: 2018-02-28 | Discharge: 2018-02-28 | Disposition: A | Discharge status: Home / Self Care | Payer: BLUE CROSS/BLUE SHIELD | Source: Ambulatory Visit | Attending: Radiation Oncology | Admitting: Radiation Oncology

## 2018-02-28 DIAGNOSIS — C50212 Malignant neoplasm of upper-inner quadrant of left female breast: Secondary | ICD-10-CM | POA: Diagnosis not present

## 2018-02-28 DIAGNOSIS — Z510 Encounter for antineoplastic radiation therapy: Secondary | ICD-10-CM | POA: Diagnosis not present

## 2018-02-28 DIAGNOSIS — Z170 Estrogen receptor positive status [ER+]: Secondary | ICD-10-CM | POA: Diagnosis not present

## 2018-03-03 ENCOUNTER — Ambulatory Visit
Admission: RE | Admit: 2018-03-03 | Discharge: 2018-03-03 | Disposition: A | Discharge status: Home / Self Care | Payer: BLUE CROSS/BLUE SHIELD | Source: Ambulatory Visit | Attending: Radiation Oncology | Admitting: Radiation Oncology

## 2018-03-03 ENCOUNTER — Encounter: Payer: Self-pay | Admitting: *Deleted

## 2018-03-03 DIAGNOSIS — Z510 Encounter for antineoplastic radiation therapy: Secondary | ICD-10-CM | POA: Diagnosis not present

## 2018-03-03 DIAGNOSIS — C50212 Malignant neoplasm of upper-inner quadrant of left female breast: Secondary | ICD-10-CM | POA: Diagnosis not present

## 2018-03-03 DIAGNOSIS — Z170 Estrogen receptor positive status [ER+]: Secondary | ICD-10-CM | POA: Diagnosis not present

## 2018-03-04 ENCOUNTER — Ambulatory Visit
Admission: RE | Admit: 2018-03-04 | Discharge: 2018-03-04 | Disposition: A | Discharge status: Home / Self Care | Payer: BLUE CROSS/BLUE SHIELD | Source: Ambulatory Visit | Attending: Radiation Oncology | Admitting: Radiation Oncology

## 2018-03-04 DIAGNOSIS — Z170 Estrogen receptor positive status [ER+]: Secondary | ICD-10-CM | POA: Diagnosis not present

## 2018-03-04 DIAGNOSIS — C50212 Malignant neoplasm of upper-inner quadrant of left female breast: Secondary | ICD-10-CM | POA: Diagnosis not present

## 2018-03-04 DIAGNOSIS — Z510 Encounter for antineoplastic radiation therapy: Secondary | ICD-10-CM | POA: Diagnosis not present

## 2018-03-05 ENCOUNTER — Ambulatory Visit
Admission: RE | Admit: 2018-03-05 | Discharge: 2018-03-05 | Disposition: A | Discharge status: Home / Self Care | Payer: BLUE CROSS/BLUE SHIELD | Source: Ambulatory Visit | Attending: Radiation Oncology | Admitting: Radiation Oncology

## 2018-03-05 DIAGNOSIS — Z170 Estrogen receptor positive status [ER+]: Secondary | ICD-10-CM | POA: Diagnosis not present

## 2018-03-05 DIAGNOSIS — Z510 Encounter for antineoplastic radiation therapy: Secondary | ICD-10-CM | POA: Diagnosis not present

## 2018-03-05 DIAGNOSIS — C50212 Malignant neoplasm of upper-inner quadrant of left female breast: Secondary | ICD-10-CM | POA: Diagnosis not present

## 2018-03-06 ENCOUNTER — Ambulatory Visit
Admission: RE | Admit: 2018-03-06 | Discharge: 2018-03-06 | Disposition: A | Discharge status: Home / Self Care | Payer: BLUE CROSS/BLUE SHIELD | Source: Ambulatory Visit | Attending: Radiation Oncology | Admitting: Radiation Oncology

## 2018-03-06 DIAGNOSIS — Z170 Estrogen receptor positive status [ER+]: Secondary | ICD-10-CM | POA: Diagnosis not present

## 2018-03-06 DIAGNOSIS — C50212 Malignant neoplasm of upper-inner quadrant of left female breast: Secondary | ICD-10-CM | POA: Diagnosis not present

## 2018-03-06 DIAGNOSIS — Z510 Encounter for antineoplastic radiation therapy: Secondary | ICD-10-CM | POA: Diagnosis not present

## 2018-03-07 ENCOUNTER — Ambulatory Visit
Admission: RE | Admit: 2018-03-07 | Discharge: 2018-03-07 | Disposition: A | Discharge status: Home / Self Care | Payer: BLUE CROSS/BLUE SHIELD | Source: Ambulatory Visit | Attending: Radiation Oncology | Admitting: Radiation Oncology

## 2018-03-07 DIAGNOSIS — Z510 Encounter for antineoplastic radiation therapy: Secondary | ICD-10-CM | POA: Diagnosis not present

## 2018-03-07 DIAGNOSIS — C50212 Malignant neoplasm of upper-inner quadrant of left female breast: Secondary | ICD-10-CM | POA: Diagnosis not present

## 2018-03-07 DIAGNOSIS — Z170 Estrogen receptor positive status [ER+]: Secondary | ICD-10-CM | POA: Diagnosis not present

## 2018-03-10 ENCOUNTER — Ambulatory Visit
Admission: RE | Admit: 2018-03-10 | Discharge: 2018-03-10 | Disposition: A | Discharge status: Home / Self Care | Payer: BLUE CROSS/BLUE SHIELD | Source: Ambulatory Visit | Attending: Radiation Oncology | Admitting: Radiation Oncology

## 2018-03-10 DIAGNOSIS — C50212 Malignant neoplasm of upper-inner quadrant of left female breast: Secondary | ICD-10-CM

## 2018-03-10 DIAGNOSIS — Z170 Estrogen receptor positive status [ER+]: Principal | ICD-10-CM

## 2018-03-10 DIAGNOSIS — Z510 Encounter for antineoplastic radiation therapy: Secondary | ICD-10-CM | POA: Diagnosis not present

## 2018-03-10 MED ORDER — RADIAPLEXRX EX GEL
Freq: Once | CUTANEOUS | Status: AC
  Administered 2018-03-10: 12:00:00 via TOPICAL

## 2018-03-11 ENCOUNTER — Ambulatory Visit
Admission: RE | Admit: 2018-03-11 | Discharge: 2018-03-11 | Disposition: A | Discharge status: Home / Self Care | Payer: BLUE CROSS/BLUE SHIELD | Source: Ambulatory Visit | Attending: Radiation Oncology | Admitting: Radiation Oncology

## 2018-03-11 DIAGNOSIS — Z510 Encounter for antineoplastic radiation therapy: Secondary | ICD-10-CM | POA: Diagnosis not present

## 2018-03-11 DIAGNOSIS — Z452 Encounter for adjustment and management of vascular access device: Secondary | ICD-10-CM | POA: Diagnosis not present

## 2018-03-11 DIAGNOSIS — Z170 Estrogen receptor positive status [ER+]: Secondary | ICD-10-CM | POA: Diagnosis not present

## 2018-03-11 DIAGNOSIS — C50212 Malignant neoplasm of upper-inner quadrant of left female breast: Secondary | ICD-10-CM | POA: Diagnosis not present

## 2018-03-12 ENCOUNTER — Ambulatory Visit
Admission: RE | Admit: 2018-03-12 | Discharge: 2018-03-12 | Disposition: A | Discharge status: Home / Self Care | Payer: BLUE CROSS/BLUE SHIELD | Source: Ambulatory Visit | Attending: Radiation Oncology | Admitting: Radiation Oncology

## 2018-03-12 DIAGNOSIS — Z510 Encounter for antineoplastic radiation therapy: Secondary | ICD-10-CM | POA: Diagnosis not present

## 2018-03-12 DIAGNOSIS — C50212 Malignant neoplasm of upper-inner quadrant of left female breast: Secondary | ICD-10-CM | POA: Diagnosis not present

## 2018-03-12 DIAGNOSIS — Z170 Estrogen receptor positive status [ER+]: Secondary | ICD-10-CM | POA: Diagnosis not present

## 2018-03-13 ENCOUNTER — Ambulatory Visit
Admission: RE | Admit: 2018-03-13 | Discharge: 2018-03-13 | Disposition: A | Discharge status: Home / Self Care | Payer: BLUE CROSS/BLUE SHIELD | Source: Ambulatory Visit | Attending: Radiation Oncology | Admitting: Radiation Oncology

## 2018-03-13 DIAGNOSIS — Z510 Encounter for antineoplastic radiation therapy: Secondary | ICD-10-CM | POA: Diagnosis not present

## 2018-03-13 DIAGNOSIS — Z170 Estrogen receptor positive status [ER+]: Secondary | ICD-10-CM | POA: Diagnosis not present

## 2018-03-13 DIAGNOSIS — C50212 Malignant neoplasm of upper-inner quadrant of left female breast: Secondary | ICD-10-CM | POA: Diagnosis not present

## 2018-03-14 ENCOUNTER — Ambulatory Visit
Admission: RE | Admit: 2018-03-14 | Discharge: 2018-03-14 | Disposition: A | Discharge status: Home / Self Care | Payer: BLUE CROSS/BLUE SHIELD | Source: Ambulatory Visit | Attending: Radiation Oncology | Admitting: Radiation Oncology

## 2018-03-14 DIAGNOSIS — Z170 Estrogen receptor positive status [ER+]: Secondary | ICD-10-CM | POA: Diagnosis not present

## 2018-03-14 DIAGNOSIS — Z510 Encounter for antineoplastic radiation therapy: Secondary | ICD-10-CM | POA: Diagnosis not present

## 2018-03-14 DIAGNOSIS — C50212 Malignant neoplasm of upper-inner quadrant of left female breast: Secondary | ICD-10-CM | POA: Diagnosis not present

## 2018-03-17 ENCOUNTER — Ambulatory Visit
Admission: RE | Admit: 2018-03-17 | Discharge: 2018-03-17 | Disposition: A | Discharge status: Home / Self Care | Payer: BLUE CROSS/BLUE SHIELD | Source: Ambulatory Visit | Attending: Radiation Oncology | Admitting: Radiation Oncology

## 2018-03-17 DIAGNOSIS — Z170 Estrogen receptor positive status [ER+]: Secondary | ICD-10-CM | POA: Diagnosis not present

## 2018-03-17 DIAGNOSIS — C50212 Malignant neoplasm of upper-inner quadrant of left female breast: Secondary | ICD-10-CM | POA: Diagnosis not present

## 2018-03-17 DIAGNOSIS — Z510 Encounter for antineoplastic radiation therapy: Secondary | ICD-10-CM | POA: Diagnosis not present

## 2018-03-18 ENCOUNTER — Ambulatory Visit
Admission: RE | Admit: 2018-03-18 | Discharge: 2018-03-18 | Disposition: A | Discharge status: Home / Self Care | Payer: BLUE CROSS/BLUE SHIELD | Source: Ambulatory Visit | Attending: Radiation Oncology | Admitting: Radiation Oncology

## 2018-03-18 DIAGNOSIS — C50212 Malignant neoplasm of upper-inner quadrant of left female breast: Secondary | ICD-10-CM | POA: Diagnosis not present

## 2018-03-18 DIAGNOSIS — Z170 Estrogen receptor positive status [ER+]: Secondary | ICD-10-CM | POA: Diagnosis not present

## 2018-03-18 DIAGNOSIS — Z510 Encounter for antineoplastic radiation therapy: Secondary | ICD-10-CM | POA: Diagnosis not present

## 2018-03-19 ENCOUNTER — Ambulatory Visit
Admission: RE | Admit: 2018-03-19 | Discharge: 2018-03-19 | Disposition: A | Discharge status: Home / Self Care | Payer: BLUE CROSS/BLUE SHIELD | Source: Ambulatory Visit | Attending: Radiation Oncology | Admitting: Radiation Oncology

## 2018-03-19 DIAGNOSIS — Z170 Estrogen receptor positive status [ER+]: Secondary | ICD-10-CM | POA: Diagnosis not present

## 2018-03-19 DIAGNOSIS — Z510 Encounter for antineoplastic radiation therapy: Secondary | ICD-10-CM | POA: Diagnosis not present

## 2018-03-19 DIAGNOSIS — C50212 Malignant neoplasm of upper-inner quadrant of left female breast: Secondary | ICD-10-CM | POA: Diagnosis not present

## 2018-03-20 ENCOUNTER — Ambulatory Visit
Admission: RE | Admit: 2018-03-20 | Discharge: 2018-03-20 | Disposition: A | Discharge status: Home / Self Care | Payer: BLUE CROSS/BLUE SHIELD | Source: Ambulatory Visit | Attending: Radiation Oncology | Admitting: Radiation Oncology

## 2018-03-20 DIAGNOSIS — Z170 Estrogen receptor positive status [ER+]: Secondary | ICD-10-CM | POA: Diagnosis not present

## 2018-03-20 DIAGNOSIS — C50212 Malignant neoplasm of upper-inner quadrant of left female breast: Secondary | ICD-10-CM | POA: Diagnosis not present

## 2018-03-20 DIAGNOSIS — Z510 Encounter for antineoplastic radiation therapy: Secondary | ICD-10-CM | POA: Diagnosis not present

## 2018-03-21 ENCOUNTER — Ambulatory Visit: Payer: BLUE CROSS/BLUE SHIELD | Admitting: Radiation Oncology

## 2018-03-21 ENCOUNTER — Ambulatory Visit
Admission: RE | Admit: 2018-03-21 | Discharge: 2018-03-21 | Disposition: A | Discharge status: Home / Self Care | Payer: BLUE CROSS/BLUE SHIELD | Source: Ambulatory Visit | Attending: Radiation Oncology | Admitting: Radiation Oncology

## 2018-03-21 DIAGNOSIS — Z170 Estrogen receptor positive status [ER+]: Secondary | ICD-10-CM | POA: Diagnosis not present

## 2018-03-21 DIAGNOSIS — Z510 Encounter for antineoplastic radiation therapy: Secondary | ICD-10-CM | POA: Diagnosis not present

## 2018-03-21 DIAGNOSIS — C50212 Malignant neoplasm of upper-inner quadrant of left female breast: Secondary | ICD-10-CM | POA: Diagnosis not present

## 2018-03-24 ENCOUNTER — Ambulatory Visit
Admission: RE | Admit: 2018-03-24 | Discharge: 2018-03-24 | Disposition: A | Discharge status: Home / Self Care | Payer: BLUE CROSS/BLUE SHIELD | Source: Ambulatory Visit | Attending: Radiation Oncology | Admitting: Radiation Oncology

## 2018-03-24 DIAGNOSIS — C50212 Malignant neoplasm of upper-inner quadrant of left female breast: Secondary | ICD-10-CM | POA: Diagnosis not present

## 2018-03-24 DIAGNOSIS — Z510 Encounter for antineoplastic radiation therapy: Secondary | ICD-10-CM | POA: Diagnosis not present

## 2018-03-24 DIAGNOSIS — Z170 Estrogen receptor positive status [ER+]: Principal | ICD-10-CM

## 2018-03-24 MED ORDER — RADIAPLEXRX EX GEL
Freq: Once | CUTANEOUS | Status: AC
  Administered 2018-03-24: 10:00:00 via TOPICAL

## 2018-03-25 ENCOUNTER — Ambulatory Visit
Admission: RE | Admit: 2018-03-25 | Discharge: 2018-03-25 | Disposition: A | Discharge status: Home / Self Care | Payer: BLUE CROSS/BLUE SHIELD | Source: Ambulatory Visit | Attending: Radiation Oncology | Admitting: Radiation Oncology

## 2018-03-25 DIAGNOSIS — C50212 Malignant neoplasm of upper-inner quadrant of left female breast: Secondary | ICD-10-CM | POA: Diagnosis not present

## 2018-03-25 DIAGNOSIS — Z170 Estrogen receptor positive status [ER+]: Secondary | ICD-10-CM | POA: Diagnosis not present

## 2018-03-25 DIAGNOSIS — Z510 Encounter for antineoplastic radiation therapy: Secondary | ICD-10-CM | POA: Diagnosis not present

## 2018-03-26 ENCOUNTER — Ambulatory Visit
Admission: RE | Admit: 2018-03-26 | Discharge: 2018-03-26 | Disposition: A | Discharge status: Home / Self Care | Payer: BLUE CROSS/BLUE SHIELD | Source: Ambulatory Visit | Attending: Radiation Oncology | Admitting: Radiation Oncology

## 2018-03-26 DIAGNOSIS — Z170 Estrogen receptor positive status [ER+]: Secondary | ICD-10-CM | POA: Diagnosis not present

## 2018-03-26 DIAGNOSIS — Z510 Encounter for antineoplastic radiation therapy: Secondary | ICD-10-CM | POA: Diagnosis not present

## 2018-03-26 DIAGNOSIS — C50212 Malignant neoplasm of upper-inner quadrant of left female breast: Secondary | ICD-10-CM | POA: Diagnosis not present

## 2018-03-27 ENCOUNTER — Ambulatory Visit
Admission: RE | Admit: 2018-03-27 | Discharge: 2018-03-27 | Disposition: A | Discharge status: Home / Self Care | Payer: BLUE CROSS/BLUE SHIELD | Source: Ambulatory Visit | Attending: Radiation Oncology | Admitting: Radiation Oncology

## 2018-03-27 DIAGNOSIS — C50212 Malignant neoplasm of upper-inner quadrant of left female breast: Secondary | ICD-10-CM | POA: Diagnosis not present

## 2018-03-27 DIAGNOSIS — Z170 Estrogen receptor positive status [ER+]: Secondary | ICD-10-CM | POA: Diagnosis not present

## 2018-03-27 DIAGNOSIS — Z510 Encounter for antineoplastic radiation therapy: Secondary | ICD-10-CM | POA: Diagnosis not present

## 2018-03-28 ENCOUNTER — Ambulatory Visit
Admission: RE | Admit: 2018-03-28 | Discharge: 2018-03-28 | Disposition: A | Discharge status: Home / Self Care | Payer: BLUE CROSS/BLUE SHIELD | Source: Ambulatory Visit | Attending: Radiation Oncology | Admitting: Radiation Oncology

## 2018-03-28 ENCOUNTER — Ambulatory Visit: Payer: BLUE CROSS/BLUE SHIELD | Admitting: Radiation Oncology

## 2018-03-28 DIAGNOSIS — Z170 Estrogen receptor positive status [ER+]: Secondary | ICD-10-CM | POA: Insufficient documentation

## 2018-03-28 DIAGNOSIS — C50212 Malignant neoplasm of upper-inner quadrant of left female breast: Secondary | ICD-10-CM | POA: Insufficient documentation

## 2018-03-28 DIAGNOSIS — Z510 Encounter for antineoplastic radiation therapy: Secondary | ICD-10-CM | POA: Diagnosis not present

## 2018-03-31 ENCOUNTER — Ambulatory Visit
Admission: RE | Admit: 2018-03-31 | Discharge: 2018-03-31 | Disposition: A | Discharge status: Home / Self Care | Payer: BLUE CROSS/BLUE SHIELD | Source: Ambulatory Visit | Attending: Radiation Oncology | Admitting: Radiation Oncology

## 2018-03-31 DIAGNOSIS — C50212 Malignant neoplasm of upper-inner quadrant of left female breast: Secondary | ICD-10-CM | POA: Diagnosis not present

## 2018-03-31 DIAGNOSIS — Z510 Encounter for antineoplastic radiation therapy: Secondary | ICD-10-CM | POA: Diagnosis not present

## 2018-03-31 DIAGNOSIS — Z170 Estrogen receptor positive status [ER+]: Secondary | ICD-10-CM | POA: Diagnosis not present

## 2018-04-01 ENCOUNTER — Ambulatory Visit
Admission: RE | Admit: 2018-04-01 | Discharge: 2018-04-01 | Disposition: A | Discharge status: Home / Self Care | Payer: BLUE CROSS/BLUE SHIELD | Source: Ambulatory Visit | Attending: Radiation Oncology | Admitting: Radiation Oncology

## 2018-04-01 DIAGNOSIS — C50212 Malignant neoplasm of upper-inner quadrant of left female breast: Secondary | ICD-10-CM | POA: Diagnosis not present

## 2018-04-01 DIAGNOSIS — Z510 Encounter for antineoplastic radiation therapy: Secondary | ICD-10-CM | POA: Diagnosis not present

## 2018-04-01 DIAGNOSIS — Z170 Estrogen receptor positive status [ER+]: Secondary | ICD-10-CM | POA: Diagnosis not present

## 2018-04-02 ENCOUNTER — Ambulatory Visit
Admission: RE | Admit: 2018-04-02 | Discharge: 2018-04-02 | Disposition: A | Discharge status: Home / Self Care | Payer: BLUE CROSS/BLUE SHIELD | Source: Ambulatory Visit | Attending: Radiation Oncology | Admitting: Radiation Oncology

## 2018-04-02 DIAGNOSIS — Z170 Estrogen receptor positive status [ER+]: Secondary | ICD-10-CM | POA: Diagnosis not present

## 2018-04-02 DIAGNOSIS — C50212 Malignant neoplasm of upper-inner quadrant of left female breast: Secondary | ICD-10-CM | POA: Diagnosis not present

## 2018-04-02 DIAGNOSIS — Z510 Encounter for antineoplastic radiation therapy: Secondary | ICD-10-CM | POA: Diagnosis not present

## 2018-04-03 ENCOUNTER — Ambulatory Visit
Admission: RE | Admit: 2018-04-03 | Discharge: 2018-04-03 | Disposition: A | Discharge status: Home / Self Care | Payer: BLUE CROSS/BLUE SHIELD | Source: Ambulatory Visit | Attending: Radiation Oncology | Admitting: Radiation Oncology

## 2018-04-03 DIAGNOSIS — Z510 Encounter for antineoplastic radiation therapy: Secondary | ICD-10-CM | POA: Diagnosis not present

## 2018-04-03 DIAGNOSIS — Z170 Estrogen receptor positive status [ER+]: Secondary | ICD-10-CM | POA: Diagnosis not present

## 2018-04-03 DIAGNOSIS — C50212 Malignant neoplasm of upper-inner quadrant of left female breast: Secondary | ICD-10-CM | POA: Diagnosis not present

## 2018-04-04 ENCOUNTER — Ambulatory Visit
Admission: RE | Admit: 2018-04-04 | Discharge: 2018-04-04 | Disposition: A | Discharge status: Home / Self Care | Payer: BLUE CROSS/BLUE SHIELD | Source: Ambulatory Visit | Attending: Radiation Oncology | Admitting: Radiation Oncology

## 2018-04-04 DIAGNOSIS — Z170 Estrogen receptor positive status [ER+]: Secondary | ICD-10-CM | POA: Diagnosis not present

## 2018-04-04 DIAGNOSIS — Z510 Encounter for antineoplastic radiation therapy: Secondary | ICD-10-CM | POA: Diagnosis not present

## 2018-04-04 DIAGNOSIS — C50212 Malignant neoplasm of upper-inner quadrant of left female breast: Secondary | ICD-10-CM | POA: Diagnosis not present

## 2018-04-04 NOTE — Progress Notes (Signed)
Patient Care Team: Jonathon Jordan, MD as PCP - General (Family Medicine)  DIAGNOSIS:    ICD-10-CM   1. Post-menopausal Z78.0 DG Bone Density  2. Malignant neoplasm of upper-inner quadrant of left breast in female, estrogen receptor positive ([place]) C50.212 MM DIAG BREAST TOMO BILATERAL   Z17.0     SUMMARY OF ONCOLOGIC HISTORY:   Malignant neoplasm of upper-inner quadrant of left breast in female, estrogen receptor positive ([place])   07/24/2017 Initial Diagnosis    Left breast palpable lump upper inner quadrant 11 o'clock position: Ill-defined 1.6 cm mass, axilla ultrasound negative, ultrasound-guided biopsy: Grade 2 IDC ER 90%, PR 40%, Ki-67 15%, HER-2 negative    08/09/2017 Genetic Testing    Negative genetic testing common hereditary cancer panel.  The Hereditary Gene Panel offered by [facility] includes sequencing and/or deletion duplication testing of the following 47 genes: APC, ATM, AXIN2, BARD1, BMPR1A, BRCA1, BRCA2, BRIP1, CDH1, CDK4, CDKN2A (p14ARF), CDKN2A (p16INK4a), CHEK2, CTNNA1, DICER1, EPCAM (Deletion/duplication testing only), GREM1 (promoter region deletion/duplication testing only), KIT, MEN1, MLH1, MSH2, MSH3, MSH6, MUTYH, NBN, NF1, NHTL1, PALB2, PDGFRA, PMS2, POLD1, POLE, PTEN, RAD50, RAD51C, RAD51D, SDHB, SDHC, SDHD, SMAD4, SMARCA4. STK11, TP53, TSC1, TSC2, and VHL.  The following genes were evaluated for sequence changes only: SDHA and HOXB13 c.251G>A variant only. The report date is August 07, 2017.    08/16/2017 Surgery    Left lumpectomy: IDC grade 2, 2.5 cm, DCIS intermediate grade, lymphovascular invasion identified, margins negative, 1/2 lymph nodes positive with extracapsular extension, ER 90%, PR 40%, HER-2 negative ratio 1.15, Ki-67 15% T2 N1a stage II a; Mammaprint high risk     08/16/2017 Miscellaneous    MammaPrint 08/16/17  High risk with average 10-year Risk of recurrence Untreated: 29% MPI: -0.399 94.6% benefit of chemotherapy and Hormonal therapy at 5 years  from distant recurrence.     09/12/2017 - 01/31/2018 Chemotherapy    Adjuvant chemotherapy with dose dense Adriamycin and Cytoxan x4 followed by Taxol weekly x12     02/24/2018 -  Radiation Therapy    Radiation therapy with Dr. Lisbeth Renshaw 02/24/18-04/09/18     CHIEF COMPLIANT: Follow up of radiation and post chemotherapy  INTERVAL HISTORY: [name] is a 37 y.o. with above-mentioned history of left breast cancer and treated with left breast lumpectomy, adjuvant chemotherapy, and currently under radiation until 04/09/18.  She presents to the clinic today with her family member. She notes she is tolerating radiation moderately well and her last day of therapy is tomorrow. She plans to have a oophorectomy 05/13/18. She is still interested in antiestrogen therapy afterward and she understands the side effects of this and preventative measures. She notes he has not had hot flashes from chemotherapy. She notes post-chemotherapy she is back to baseline with adequate appetite, taste and peripheral neural feeling.  She is interested in the [facility] as well and is waiting on more information and next steps of this trail from the research nurse.  She shared her concern for breast cancer recurrence and her risk for this as she has family history of breast cancer from her maternal great-aunt. She notes back pain in her upper right shoulder. Her mother notes this is likely not cancer related as the patient is very aware of body and anxious about recurrence.  Her next mammogram and DEXA scan will be in 06/2018 and her MRI in 12/2018.   REVIEW OF SYSTEMS:   Constitutional: Denies fevers, chills or abnormal weight loss (+) adequate appetite and taste Eyes:  Denies blurriness of vision Ears, nose, mouth, throat, and face: Denies mucositis or sore throat Respiratory: Denies cough, dyspnea or wheezes Cardiovascular: Denies palpitation, chest discomfort Gastrointestinal:  Denies nausea, heartburn or change in  bowel habits Skin: Denies abnormal skin rashes MSK: (+) Upper right back pain  Lymphatics: Denies new lymphadenopathy or easy bruising Neurological:Denies numbness, tingling or new weaknesses Behavioral/Psych: Mood is stable, no new changes  Extremities: No lower extremity edema Breast: denies any pain or lumps or nodules in either breasts All other systems were reviewed with the patient and are negative.  I have reviewed the past medical history, past surgical history, social history and family history with the patient and they are unchanged from previous note.  ALLERGIES:  has No Known Allergies.  MEDICATIONS:  Current Outpatient Medications  Medication Sig Dispense Refill  . escitalopram (LEXAPRO) 20 MG tablet Take 20 mg by mouth daily with supper.     . lidocaine-prilocaine (EMLA) cream Apply to affected area once 30 g 3   No current facility-administered medications for this visit.    Facility-Administered Medications Ordered in Other Visits  Medication Dose Route Frequency Provider Last Rate Last Dose  . heparin lock flush 100 unit/mL  500 Units Intracatheter Once PRN Nicholas Lose, MD      . sodium chloride flush (NS) 0.9 % injection 10 mL  10 mL Intracatheter PRN Nicholas Lose, MD   10 mL at 11/15/17 1449  . sodium chloride flush (NS) 0.9 % injection 10 mL  10 mL Intracatheter PRN Nicholas Lose, MD      . sodium chloride flush (NS) 0.9 % injection 10 mL  10 mL Intracatheter PRN Nicholas Lose, MD   10 mL at 12/13/17 1500    PHYSICAL EXAMINATION: ECOG PERFORMANCE STATUS: 1 - Symptomatic but completely ambulatory  Vitals:   04/08/18 0851  BP: 116/60  Pulse: 63  Resp: 17  Temp: 98.6 F (37 C)  SpO2: 100%   Filed Weights   04/08/18 0851  Weight: 245 lb 3.2 oz (111.2 kg)    GENERAL:alert, no distress and comfortable SKIN: skin color, texture, turgor are normal, no rashes or significant lesions EYES: normal, Conjunctiva are pink and non-injected, sclera  clear OROPHARYNX:no exudate, no erythema and lips, buccal mucosa, and tongue normal  NECK: supple, thyroid normal size, non-tender, without nodularity LYMPH:  no palpable lymphadenopathy in the cervical, axillary or inguinal LUNGS: clear to auscultation and percussion with normal breathing effort HEART: regular rate & rhythm and no murmurs and no lower extremity edema ABDOMEN:abdomen soft, non-tender and normal bowel sounds MUSCULOSKELETAL:no cyanosis of digits and no clubbing  NEURO: alert & oriented x 3 with fluent speech, no focal motor/sensory deficits EXTREMITIES: No lower extremity edema   LABORATORY DATA:  I have reviewed the data as listed CMP Latest Ref Rng & Units 01/31/2018 01/24/2018 01/17/2018  Glucose 70 - 99 mg/dL 141(H) 161(H) 133(H)  BUN 6 - 20 mg/dL '13 10 11  '$ Creatinine 0.44 - 1.00 mg/dL 0.83 0.83 0.79  Sodium 135 - 145 mmol/L 140 141 142  Potassium 3.5 - 5.1 mmol/L 3.9 3.9 4.1  Chloride 98 - 111 mmol/L 106 106 107  CO2 22 - 32 mmol/L '24 26 25  '$ Calcium 8.9 - 10.3 mg/dL 8.8(L) 8.9 8.8(L)  Total Protein 6.5 - 8.1 g/dL 6.4(L) 6.3(L) 6.3(L)  Total Bilirubin 0.3 - 1.2 mg/dL 0.5 0.4 0.3  Alkaline Phos 38 - 126 U/L 76 72 87  AST 15 - 41 U/L 32 33 31  ALT  0 - 44 U/L 45(H) 51(H) 51(H)    Lab Results  Component Value Date   WBC 5.8 01/31/2018   HGB 11.7 01/31/2018   HCT 34.6 (L) 01/31/2018   MCV 90.3 01/31/2018   PLT 234 01/31/2018   NEUTROABS 3.9 01/31/2018    ASSESSMENT & PLAN:  Malignant neoplasm of upper-inner quadrant of left breast in female, estrogen receptor positive ([place]) 08/19/2017:Left lumpectomy: IDC grade 2, 2.5 cm, DCIS intermediate grade, lymphovascular invasion identified, margins negative, 1/2 lymph nodes positive with extracapsular extension, ER 90%, PR 40%, HER-2 negative ratio 1.15, Ki-67 15% T2 N1a stage II a; Mammaprint high risk   Recommendation: 1. Adjuvant chemotherapy with dose dense Adriamycin and Cytoxan x4 followed by Taxol weekly x12  completed 01/31/2018 2. followed by adjuvant radiation therapy which will be completed 04/09/2018 3. Followed by adjuvant antiestrogen therapy; will enroll the patient in Natalee clinical trial _______________________________________________________________________________________________  Surveillance: We briefly discussed that we may consider MRI surveillance in addition to mammograms. She is very claustrophobic and may require sedation if the decide to do an MRI.  Letrozole counseling: We discussed the risks and benefits of anti-estrogen therapy with aromatase inhibitors. These include but not limited to insomnia, hot flashes, mood changes, vaginal dryness, bone density loss, and weight gain. We strongly believe that the benefits far outweigh the risks. Patient understands these risks and consented to starting treatment. Planned treatment duration is 7 years.  NATALEE clinical trial: Phase 3 clinical trial in patients or estrogen receptor positive randomized between standard antiestrogen therapy versus antiestrogen therapy with ribociclib 400 mg for 36 months.   Ribociclib: I discussed the risks and benefits of Ribociclib including myelosuppression especially neutropenia and with that risk of infection, there is risk of pulmonary embolism and mild peripheral neuropathy as well. Fatigue, nausea, diarrhea, decreased appetite as well as alopecia and thrombocytopenia are also potential side effects of Ribociclib.  Patient will start letrozole January 2020 She is getting oophorectomy December 2019  Risk of recurrence discussion: Patient had multiple questions regarding the risk of recurrence.  I reviewed the Mammaprint test results and discussed that it suggests that her risk of distant recurrence with chemotherapy at 5 years was 5.4%.  We will plan for DEXA scan and mammogram in the bruit at [facility]. Breast MRI will be done in August 2020 with anesthesia help. When she comes back in January we can  discuss Natalee clinical trial In April she will have survivorship care plan visit  Orders Placed This Encounter  Procedures  . MM DIAG BREAST TOMO BILATERAL    Standing Status:   Future    Standing Expiration Date:   04/09/2019    Order Specific Question:   Reason for Exam (SYMPTOM  OR DIAGNOSIS REQUIRED)    Answer:   Annual mammogram with H/O breast cancer    Order Specific Question:   Is the patient pregnant?    Answer:   No    Order Specific Question:   Preferred imaging location?    Answer:   External    Comments:   [facility]  . DG Bone Density    Standing Status:   Future    Standing Expiration Date:   05/13/2019    Order Specific Question:   Reason for exam:    Answer:   Post oophorectomy and post menopausal state before starting anti estrogen therapy    Order Specific Question:   Preferred imaging location?    Answer:   External    Comments:  [facility]   The patient has a good understanding of the overall plan. she agrees with it. she will call with any problems that may develop before the next visit here.  Nicholas Lose, MD 04/08/2018  [name], am acting as scribe for Nicholas Lose, MD.  I have reviewed the above documentation for accuracy and completeness, and I agree with the above.

## 2018-04-07 ENCOUNTER — Ambulatory Visit
Admission: RE | Admit: 2018-04-07 | Discharge: 2018-04-07 | Disposition: A | Discharge status: Home / Self Care | Payer: BLUE CROSS/BLUE SHIELD | Source: Ambulatory Visit | Attending: Radiation Oncology | Admitting: Radiation Oncology

## 2018-04-07 DIAGNOSIS — Z510 Encounter for antineoplastic radiation therapy: Secondary | ICD-10-CM | POA: Diagnosis not present

## 2018-04-07 DIAGNOSIS — C50212 Malignant neoplasm of upper-inner quadrant of left female breast: Secondary | ICD-10-CM | POA: Diagnosis not present

## 2018-04-07 DIAGNOSIS — Z170 Estrogen receptor positive status [ER+]: Secondary | ICD-10-CM | POA: Diagnosis not present

## 2018-04-08 ENCOUNTER — Inpatient Hospital Stay: Payer: BLUE CROSS/BLUE SHIELD | Attending: Hematology and Oncology | Admitting: Hematology and Oncology

## 2018-04-08 ENCOUNTER — Telehealth: Payer: Self-pay | Admitting: Hematology and Oncology

## 2018-04-08 ENCOUNTER — Ambulatory Visit
Admission: RE | Admit: 2018-04-08 | Discharge: 2018-04-08 | Disposition: A | Discharge status: Home / Self Care | Payer: BLUE CROSS/BLUE SHIELD | Source: Ambulatory Visit | Attending: Radiation Oncology | Admitting: Radiation Oncology

## 2018-04-08 ENCOUNTER — Encounter: Payer: Self-pay | Admitting: *Deleted

## 2018-04-08 VITALS — BP 116/60 | HR 63 | Temp 98.6°F | Resp 17 | Ht 66.0 in | Wt 245.2 lb

## 2018-04-08 DIAGNOSIS — Z170 Estrogen receptor positive status [ER+]: Secondary | ICD-10-CM

## 2018-04-08 DIAGNOSIS — Z780 Asymptomatic menopausal state: Secondary | ICD-10-CM

## 2018-04-08 DIAGNOSIS — Z510 Encounter for antineoplastic radiation therapy: Secondary | ICD-10-CM | POA: Diagnosis not present

## 2018-04-08 DIAGNOSIS — Z803 Family history of malignant neoplasm of breast: Secondary | ICD-10-CM | POA: Insufficient documentation

## 2018-04-08 DIAGNOSIS — Z923 Personal history of irradiation: Secondary | ICD-10-CM | POA: Diagnosis not present

## 2018-04-08 DIAGNOSIS — C50212 Malignant neoplasm of upper-inner quadrant of left female breast: Secondary | ICD-10-CM | POA: Diagnosis not present

## 2018-04-08 DIAGNOSIS — M549 Dorsalgia, unspecified: Secondary | ICD-10-CM | POA: Diagnosis not present

## 2018-04-08 DIAGNOSIS — Z9221 Personal history of antineoplastic chemotherapy: Secondary | ICD-10-CM

## 2018-04-08 NOTE — Telephone Encounter (Signed)
Gave pt avs and calendar  °

## 2018-04-08 NOTE — Assessment & Plan Note (Addendum)
08/19/2017:Left lumpectomy: IDC grade 2, 2.5 cm, DCIS intermediate grade, lymphovascular invasion identified, margins negative, 1/2 lymph nodes positive with extracapsular extension, ER 90%, PR 40%, HER-2 negative ratio 1.15, Ki-67 15% T2 N1a stage II a; Mammaprint high risk   Recommendation: 1. Adjuvant chemotherapy with dose dense Adriamycin and Cytoxan x4 followed by Taxol weekly x12 completed 01/31/2018 2. followed by adjuvant radiation therapy 3. Followed by adjuvant antiestrogen therapy; will enroll the patient in Natalee clinical trial _______________________________________________________________________________________________  Surveillance: We briefly discussed that we may consider MRI surveillance in addition to mammograms. She is very claustrophobic and may require sedation if the decide to do an MRI.  Start antiestrogen therapy with Zoladex and letrozole. We can stop Zoladex once she completes oophorectomy.  Subsequently we can discuss if she could go on Natalee clinical trial.

## 2018-04-09 ENCOUNTER — Encounter: Payer: Self-pay | Admitting: Radiation Oncology

## 2018-04-09 ENCOUNTER — Ambulatory Visit
Admission: RE | Admit: 2018-04-09 | Discharge: 2018-04-09 | Disposition: A | Discharge status: Home / Self Care | Payer: BLUE CROSS/BLUE SHIELD | Source: Ambulatory Visit | Attending: Radiation Oncology | Admitting: Radiation Oncology

## 2018-04-09 DIAGNOSIS — Z170 Estrogen receptor positive status [ER+]: Secondary | ICD-10-CM | POA: Diagnosis not present

## 2018-04-09 DIAGNOSIS — Z510 Encounter for antineoplastic radiation therapy: Secondary | ICD-10-CM | POA: Diagnosis not present

## 2018-04-09 DIAGNOSIS — C50212 Malignant neoplasm of upper-inner quadrant of left female breast: Secondary | ICD-10-CM | POA: Diagnosis not present

## 2018-04-11 ENCOUNTER — Telehealth: Payer: Self-pay | Admitting: Hematology and Oncology

## 2018-04-11 NOTE — Telephone Encounter (Signed)
Scheduled appt per 11/15 sch message - sent reminder letter in the mail with appt date and time.

## 2018-04-21 DIAGNOSIS — Z23 Encounter for immunization: Secondary | ICD-10-CM | POA: Diagnosis not present

## 2018-04-21 DIAGNOSIS — B353 Tinea pedis: Secondary | ICD-10-CM | POA: Diagnosis not present

## 2018-04-22 ENCOUNTER — Other Ambulatory Visit: Payer: Self-pay | Admitting: Obstetrics and Gynecology

## 2018-04-23 DIAGNOSIS — B309 Viral conjunctivitis, unspecified: Secondary | ICD-10-CM | POA: Diagnosis not present

## 2018-04-23 DIAGNOSIS — J069 Acute upper respiratory infection, unspecified: Secondary | ICD-10-CM | POA: Diagnosis not present

## 2018-05-01 NOTE — Progress Notes (Signed)
°  Radiation Oncology         [phone] ________________________________  Name: [name] MRN: [id number]  Date: 04/09/2018  DOB: 07-09-1980  End of Treatment Note  Diagnosis:   37 y.o. female with Stage IB, pT2N1aM0, grade 2, ER/PR positive invasive ductal carcinoma of the left breast    Indication for treatment:  Curative       Radiation treatment dates:   02/24/2018 - 04/09/2018  Site/dose:   The patient initially received a dose of 50.4 Gy in 28 fractions to the left breast and supraclavicular region using whole-breast tangent fields. This was delivered using a 3-D conformal technique. The patient then received a boost to the seroma. This delivered an additional 10 Gy in 5 fractions using 6X, 15X photons with a Complex Isodose technique. The total dose was 60.4 Gy.  Narrative: The patient tolerated radiation treatment relatively well.   The patient had some expected skin irritation with moderate erythema as she progressed during treatment. Moist desquamation was not present at the end of treatment. No active nipple discharge or signs of infection. She is using Radiaplex as directed.  Plan: The patient has completed radiation treatment. The patient will return to radiation oncology clinic for routine followup in one month. I advised the patient to call or return sooner if they have any questions or concerns related to their recovery or treatment. ________________________________  Jodelle Gross, MD, PhD  This document serves as a record of services personally performed by Kyung Rudd, MD. It was created on his behalf by Rae Lips, a trained medical scribe. The creation of this record is based on the scribe's personal observations and the provider's statements to them. This document has been checked and approved by the attending provider.

## 2018-05-07 NOTE — Progress Notes (Signed)
Echo 10-29-17 epic Ct chest 10-17-17 epic

## 2018-05-07 NOTE — Patient Instructions (Signed)
DALONDA SIMONI  05/07/2018   Your procedure is scheduled on: 05-13-18  Report to [facility] Main  Entrance  Report to admitting at      0530 AM    Call this number if you have problems the morning of surgery [phone]    Remember: Do not eat food or drink liquids :After Midnight.  BRUSH YOUR TEETH MORNING OF SURGERY AND RINSE YOUR MOUTH OUT, NO CHEWING GUM CANDY OR MINTS.     Take these medicines the morning of surgery with A SIP OF WATER: none                                You may not have any metal on your body including hair pins and              piercings  Do not wear jewelry, make-up, lotions, powders or perfumes, deodorant             Do not wear nail polish.  Do not shave  48 hours prior to surgery.     Do not bring valuables to the hospital. [place].  Contacts, dentures or bridgework may not be worn into surgery.      Patients discharged the day of surgery will not be allowed to drive home.  Name and phone number of your driver:  Special Instructions: N/A              Please read over the following fact sheets you were given: _____________________________________________________________________          [facility] - Preparing for Surgery Before surgery, you can play an important role.  Because skin is not sterile, your skin needs to be as free of germs as possible.  You can reduce the number of germs on your skin by washing with CHG (chlorahexidine gluconate) soap before surgery.  CHG is an antiseptic cleaner which kills germs and bonds with the skin to continue killing germs even after washing. Please DO NOT use if you have an allergy to CHG or antibacterial soaps.  If your skin becomes reddened/irritated stop using the CHG and inform your nurse when you arrive at Short Stay. Do not shave (including legs and underarms) for at least 48 hours prior to the first CHG shower.  You may shave  your face/neck. Please follow these instructions carefully:  1.  Shower with CHG Soap the night before surgery and the  morning of Surgery.  2.  If you choose to wash your hair, wash your hair first as usual with your  normal  shampoo.  3.  After you shampoo, rinse your hair and body thoroughly to remove the  shampoo.                           4.  Use CHG as you would any other liquid soap.  You can apply chg directly  to the skin and wash                       Gently with a scrungie or clean washcloth.  5.  Apply the CHG Soap to your body ONLY FROM THE NECK DOWN.  Do not use on face/ open                           Wound or open sores. Avoid contact with eyes, ears mouth and genitals (private parts).                       Wash face,  Genitals (private parts) with your normal soap.             6.  Wash thoroughly, paying special attention to the area where your surgery  will be performed.  7.  Thoroughly rinse your body with warm water from the neck down.  8.  DO NOT shower/wash with your normal soap after using and rinsing off  the CHG Soap.                9.  Pat yourself dry with a clean towel.            10.  Wear clean pajamas.            11.  Place clean sheets on your bed the night of your first shower and do not  sleep with pets. Day of Surgery : Do not apply any lotions/deodorants the morning of surgery.  Please wear clean clothes to the hospital/surgery center.  FAILURE TO FOLLOW THESE INSTRUCTIONS MAY RESULT IN THE CANCELLATION OF YOUR SURGERY PATIENT SIGNATURE_________________________________  NURSE SIGNATURE__________________________________  ________________________________________________________________________

## 2018-05-08 ENCOUNTER — Encounter (HOSPITAL_COMMUNITY)
Admission: RE | Admit: 2018-05-08 | Discharge: 2018-05-08 | Disposition: A | Discharge status: Home / Self Care | Payer: BLUE CROSS/BLUE SHIELD | Source: Ambulatory Visit | Attending: Obstetrics and Gynecology | Admitting: Obstetrics and Gynecology

## 2018-05-08 ENCOUNTER — Other Ambulatory Visit: Payer: Self-pay

## 2018-05-08 ENCOUNTER — Encounter (HOSPITAL_COMMUNITY): Payer: Self-pay

## 2018-05-08 DIAGNOSIS — Z01818 Encounter for other preprocedural examination: Secondary | ICD-10-CM | POA: Diagnosis not present

## 2018-05-08 DIAGNOSIS — C50912 Malignant neoplasm of unspecified site of left female breast: Secondary | ICD-10-CM | POA: Diagnosis not present

## 2018-05-08 LAB — CBC
HEMATOCRIT: 36.4 % (ref 36.0–46.0)
HEMOGLOBIN: 12.3 g/dL (ref 12.0–15.0)
MCH: 30.4 pg (ref 26.0–34.0)
MCHC: 33.8 g/dL (ref 30.0–36.0)
MCV: 90.1 fL (ref 80.0–100.0)
NRBC: 0 % (ref 0.0–0.2)
Platelets: 201 10*3/uL (ref 150–400)
RBC: 4.04 MIL/uL (ref 3.87–5.11)
RDW: 12.6 % (ref 11.5–15.5)
WBC: 5.4 10*3/uL (ref 4.0–10.5)

## 2018-05-08 LAB — HCG, SERUM, QUALITATIVE: PREG SERUM: NEGATIVE

## 2018-05-12 NOTE — Anesthesia Preprocedure Evaluation (Signed)
Anesthesia Evaluation  Patient identified by MRN, date of birth, ID band Patient awake    Reviewed: Allergy & Precautions, NPO status , Patient's Chart, lab work & pertinent test results  Airway Mallampati: II  TM Distance: >3 FB Neck ROM: Full    Dental   Pulmonary former smoker,    Pulmonary exam normal        Cardiovascular Normal cardiovascular exam     Neuro/Psych Anxiety    GI/Hepatic   Endo/Other    Renal/GU      Musculoskeletal   Abdominal   Peds  Hematology   Anesthesia Other Findings   Reproductive/Obstetrics                             Anesthesia Physical  Anesthesia Plan  ASA: II  Anesthesia Plan: General   Post-op Pain Management:    Induction: Intravenous  PONV Risk Score and Plan: 3 and Ondansetron, Dexamethasone and Midazolam  Airway Management Planned: Oral ETT  Additional Equipment:   Intra-op Plan:   Post-operative Plan: Extubation in OR  Informed Consent: I have reviewed the patients History and Physical, chart, labs and discussed the procedure including the risks, benefits and alternatives for the proposed anesthesia with the patient or authorized representative who has indicated his/her understanding and acceptance.     Plan Discussed with: CRNA, Surgeon and Anesthesiologist  Anesthesia Plan Comments: (  )        Anesthesia Quick Evaluation

## 2018-05-13 ENCOUNTER — Ambulatory Visit (HOSPITAL_BASED_OUTPATIENT_CLINIC_OR_DEPARTMENT_OTHER): Payer: BLUE CROSS/BLUE SHIELD | Admitting: Anesthesiology

## 2018-05-13 ENCOUNTER — Ambulatory Visit (HOSPITAL_COMMUNITY)
Admission: RE | Admit: 2018-05-13 | Discharge: 2018-05-13 | Disposition: A | Discharge status: Home / Self Care | Payer: BLUE CROSS/BLUE SHIELD | Attending: Obstetrics and Gynecology | Admitting: Obstetrics and Gynecology

## 2018-05-13 ENCOUNTER — Encounter (HOSPITAL_COMMUNITY)
Admission: RE | Disposition: A | Discharge status: Home / Self Care | Payer: Self-pay | Source: Home / Self Care | Attending: Obstetrics and Gynecology

## 2018-05-13 ENCOUNTER — Encounter (HOSPITAL_COMMUNITY): Payer: Self-pay

## 2018-05-13 DIAGNOSIS — N8302 Follicular cyst of left ovary: Secondary | ICD-10-CM | POA: Insufficient documentation

## 2018-05-13 DIAGNOSIS — Z79899 Other long term (current) drug therapy: Secondary | ICD-10-CM | POA: Diagnosis not present

## 2018-05-13 DIAGNOSIS — N736 Female pelvic peritoneal adhesions (postinfective): Secondary | ICD-10-CM | POA: Diagnosis not present

## 2018-05-13 DIAGNOSIS — Z833 Family history of diabetes mellitus: Secondary | ICD-10-CM | POA: Diagnosis not present

## 2018-05-13 DIAGNOSIS — F419 Anxiety disorder, unspecified: Secondary | ICD-10-CM | POA: Insufficient documentation

## 2018-05-13 DIAGNOSIS — Z853 Personal history of malignant neoplasm of breast: Secondary | ICD-10-CM | POA: Insufficient documentation

## 2018-05-13 DIAGNOSIS — N8301 Follicular cyst of right ovary: Secondary | ICD-10-CM | POA: Insufficient documentation

## 2018-05-13 DIAGNOSIS — N838 Other noninflammatory disorders of ovary, fallopian tube and broad ligament: Secondary | ICD-10-CM | POA: Insufficient documentation

## 2018-05-13 DIAGNOSIS — Z87891 Personal history of nicotine dependence: Secondary | ICD-10-CM | POA: Insufficient documentation

## 2018-05-13 DIAGNOSIS — Z4002 Encounter for prophylactic removal of ovary: Secondary | ICD-10-CM | POA: Diagnosis not present

## 2018-05-13 HISTORY — PX: ROBOTIC ASSISTED SALPINGO OOPHERECTOMY: SHX6082

## 2018-05-13 SURGERY — SALPINGO-OOPHORECTOMY, ROBOT-ASSISTED
Anesthesia: General | Laterality: Bilateral

## 2018-05-13 MED ORDER — ONDANSETRON HCL 4 MG/2ML IJ SOLN
INTRAMUSCULAR | Status: DC | PRN
  Administered 2018-05-13: 4 mg via INTRAVENOUS

## 2018-05-13 MED ORDER — BUPIVACAINE HCL (PF) 0.25 % IJ SOLN
INTRAMUSCULAR | Status: AC
  Filled 2018-05-13: qty 30

## 2018-05-13 MED ORDER — ACETAMINOPHEN 325 MG PO TABS: 325.0000 mg | ORAL_TABLET | ORAL | Status: DC | PRN

## 2018-05-13 MED ORDER — ONDANSETRON HCL 4 MG/2ML IJ SOLN
INTRAMUSCULAR | Status: AC
  Filled 2018-05-13: qty 2

## 2018-05-13 MED ORDER — OXYCODONE HCL 5 MG PO TABS
ORAL_TABLET | ORAL | Status: AC
  Filled 2018-05-13: qty 1

## 2018-05-13 MED ORDER — BUPIVACAINE HCL (PF) 0.25 % IJ SOLN
INTRAMUSCULAR | Status: DC | PRN
  Administered 2018-05-13: 20 mL

## 2018-05-13 MED ORDER — FENTANYL CITRATE (PF) 250 MCG/5ML IJ SOLN
INTRAMUSCULAR | Status: AC
  Filled 2018-05-13: qty 5

## 2018-05-13 MED ORDER — FENTANYL CITRATE (PF) 100 MCG/2ML IJ SOLN
25.0000 ug | INTRAMUSCULAR | Status: DC | PRN
  Administered 2018-05-13: 50 ug via INTRAVENOUS

## 2018-05-13 MED ORDER — EPHEDRINE 5 MG/ML INJ
INTRAVENOUS | Status: AC
  Filled 2018-05-13: qty 10

## 2018-05-13 MED ORDER — ROCURONIUM BROMIDE 100 MG/10ML IV SOLN
INTRAVENOUS | Status: DC | PRN
  Administered 2018-05-13: 15 mg via INTRAVENOUS
  Administered 2018-05-13: 50 mg via INTRAVENOUS
  Administered 2018-05-13: 10 mg via INTRAVENOUS

## 2018-05-13 MED ORDER — PROPOFOL 10 MG/ML IV BOLUS
INTRAVENOUS | Status: AC
  Filled 2018-05-13: qty 20

## 2018-05-13 MED ORDER — OXYCODONE HCL 5 MG PO TABS
5.0000 mg | ORAL_TABLET | Freq: Once | ORAL | Status: AC | PRN
  Administered 2018-05-13: 5 mg via ORAL

## 2018-05-13 MED ORDER — ONDANSETRON HCL 4 MG/2ML IJ SOLN: 4.0000 mg | Freq: Once | INTRAMUSCULAR | Status: DC | PRN

## 2018-05-13 MED ORDER — PROPOFOL 10 MG/ML IV BOLUS
INTRAVENOUS | Status: DC | PRN
  Administered 2018-05-13: 160 mg via INTRAVENOUS

## 2018-05-13 MED ORDER — MIDAZOLAM HCL 5 MG/5ML IJ SOLN
INTRAMUSCULAR | Status: DC | PRN
  Administered 2018-05-13 (×2): 1 mg via INTRAVENOUS

## 2018-05-13 MED ORDER — SUGAMMADEX SODIUM 200 MG/2ML IV SOLN
INTRAVENOUS | Status: DC | PRN
  Administered 2018-05-13: 400 mg via INTRAVENOUS

## 2018-05-13 MED ORDER — LIDOCAINE 2% (20 MG/ML) 5 ML SYRINGE
INTRAMUSCULAR | Status: AC
  Filled 2018-05-13: qty 5

## 2018-05-13 MED ORDER — ACETAMINOPHEN 160 MG/5ML PO SOLN: 325.0000 mg | ORAL | Status: DC | PRN

## 2018-05-13 MED ORDER — ROCURONIUM BROMIDE 10 MG/ML (PF) SYRINGE
PREFILLED_SYRINGE | INTRAVENOUS | Status: AC
  Filled 2018-05-13: qty 10

## 2018-05-13 MED ORDER — MIDAZOLAM HCL 2 MG/2ML IJ SOLN
INTRAMUSCULAR | Status: AC
  Filled 2018-05-13: qty 2

## 2018-05-13 MED ORDER — OXYCODONE HCL 5 MG/5ML PO SOLN
5.0000 mg | Freq: Once | ORAL | Status: AC | PRN
  Filled 2018-05-13: qty 5

## 2018-05-13 MED ORDER — MEPERIDINE HCL 50 MG/ML IJ SOLN: 6.2500 mg | INTRAMUSCULAR | Status: DC | PRN

## 2018-05-13 MED ORDER — FENTANYL CITRATE (PF) 100 MCG/2ML IJ SOLN
INTRAMUSCULAR | Status: AC
  Filled 2018-05-13: qty 4

## 2018-05-13 MED ORDER — ROPIVACAINE HCL 5 MG/ML IJ SOLN
INTRAMUSCULAR | Status: AC
  Filled 2018-05-13: qty 30

## 2018-05-13 MED ORDER — FENTANYL CITRATE (PF) 100 MCG/2ML IJ SOLN
INTRAMUSCULAR | Status: DC | PRN
  Administered 2018-05-13 (×3): 50 ug via INTRAVENOUS
  Administered 2018-05-13: 100 ug via INTRAVENOUS

## 2018-05-13 MED ORDER — SODIUM CHLORIDE 0.9 % IV SOLN
INTRAVENOUS | Status: DC | PRN
  Administered 2018-05-13: 60 mL

## 2018-05-13 MED ORDER — CEFAZOLIN SODIUM-DEXTROSE 2-4 GM/100ML-% IV SOLN
2.0000 g | INTRAVENOUS | Status: AC
  Administered 2018-05-13: 2 g via INTRAVENOUS
  Filled 2018-05-13: qty 100

## 2018-05-13 MED ORDER — PROPOFOL 500 MG/50ML IV EMUL
INTRAVENOUS | Status: DC | PRN
  Administered 2018-05-13: 25 ug/kg/min via INTRAVENOUS

## 2018-05-13 MED ORDER — SUGAMMADEX SODIUM 200 MG/2ML IV SOLN
INTRAVENOUS | Status: AC
  Filled 2018-05-13: qty 4

## 2018-05-13 MED ORDER — OXYCODONE HCL 5 MG PO TABS: 5.0000 mg | ORAL_TABLET | Freq: Four times a day (QID) | ORAL | 0 refills | Status: DC | PRN

## 2018-05-13 MED ORDER — LIDOCAINE HCL (CARDIAC) PF 100 MG/5ML IV SOSY
PREFILLED_SYRINGE | INTRAVENOUS | Status: DC | PRN
  Administered 2018-05-13: 50 mg via INTRAVENOUS

## 2018-05-13 MED ORDER — EPHEDRINE SULFATE 50 MG/ML IJ SOLN
INTRAMUSCULAR | Status: DC | PRN
  Administered 2018-05-13: 5 mg via INTRAVENOUS
  Administered 2018-05-13: 10 mg via INTRAVENOUS

## 2018-05-13 MED ORDER — DEXAMETHASONE SODIUM PHOSPHATE 10 MG/ML IJ SOLN
INTRAMUSCULAR | Status: DC | PRN
  Administered 2018-05-13: 10 mg via INTRAVENOUS

## 2018-05-13 MED ORDER — LACTATED RINGERS IV SOLN
INTRAVENOUS | Status: DC
  Administered 2018-05-13: 06:00:00 via INTRAVENOUS

## 2018-05-13 MED ORDER — MIDAZOLAM HCL 2 MG/2ML IJ SOLN
1.0000 mg | INTRAMUSCULAR | Status: DC
  Administered 2018-05-13 (×2): 1 mg via INTRAVENOUS

## 2018-05-13 MED ORDER — SODIUM CHLORIDE (PF) 0.9 % IJ SOLN
INTRAMUSCULAR | Status: AC
  Filled 2018-05-13: qty 50

## 2018-05-13 SURGICAL SUPPLY — 56 items
ADH SKN CLS APL DERMABOND .7 (GAUZE/BANDAGES/DRESSINGS) ×1
BAG SPEC RTRVL LRG 6X4 10 (ENDOMECHANICALS) ×2
BARRIER ADHS 3X4 INTERCEED (GAUZE/BANDAGES/DRESSINGS) IMPLANT
BRR ADH 4X3 ABS CNTRL BYND (GAUZE/BANDAGES/DRESSINGS)
CANISTER SUCT 3000ML PPV (MISCELLANEOUS) ×2 IMPLANT
CATH FOLEY 3WAY  5CC 16FR (CATHETERS) ×1
CATH FOLEY 3WAY 5CC 16FR (CATHETERS) ×1 IMPLANT
COVER BACK TABLE 60X90IN (DRAPES) ×2 IMPLANT
COVER TIP SHEARS 8 DVNC (MISCELLANEOUS) ×1 IMPLANT
COVER TIP SHEARS 8MM DA VINCI (MISCELLANEOUS) ×1
DECANTER SPIKE VIAL GLASS SM (MISCELLANEOUS) ×4 IMPLANT
DEFOGGER SCOPE WARMER CLEARIFY (MISCELLANEOUS) ×2 IMPLANT
DERMABOND ADVANCED (GAUZE/BANDAGES/DRESSINGS) ×1
DERMABOND ADVANCED .7 DNX12 (GAUZE/BANDAGES/DRESSINGS) ×1 IMPLANT
DRAPE ARM DVNC X/XI (DISPOSABLE) ×4 IMPLANT
DRAPE COLUMN DVNC XI (DISPOSABLE) ×1 IMPLANT
DRAPE DA VINCI XI ARM (DISPOSABLE) ×4
DRAPE DA VINCI XI COLUMN (DISPOSABLE) ×1
DURAPREP 26ML APPLICATOR (WOUND CARE) ×2 IMPLANT
ELECT REM PT RETURN 9FT ADLT (ELECTROSURGICAL) ×2
ELECTRODE REM PT RTRN 9FT ADLT (ELECTROSURGICAL) ×1 IMPLANT
GLOVE BIO SURGEON STRL SZ7.5 (GLOVE) ×6 IMPLANT
GLOVE BIOGEL PI IND STRL 7.0 (GLOVE) ×2 IMPLANT
GLOVE BIOGEL PI INDICATOR 7.0 (GLOVE) ×2
IRRIG SUCT STRYKERFLOW 2 WTIP (MISCELLANEOUS) ×2
IRRIGATION SUCT STRKRFLW 2 WTP (MISCELLANEOUS) ×1 IMPLANT
NEEDLE INSUFFLATION 150MM (ENDOMECHANICALS) ×2 IMPLANT
OBTURATOR OPTICAL STANDARD 8MM (TROCAR) ×1
OBTURATOR OPTICAL STND 8 DVNC (TROCAR) ×1
OBTURATOR OPTICALSTD 8 DVNC (TROCAR) IMPLANT
OCCLUDER COLPOPNEUMO (BALLOONS) ×2 IMPLANT
PACK ROBOT WH (CUSTOM PROCEDURE TRAY) ×2 IMPLANT
PACK ROBOTIC GOWN (GOWN DISPOSABLE) ×2 IMPLANT
PACK TRENDGUARD 450 HYBRID PRO (MISCELLANEOUS) IMPLANT
PAD PREP 24X48 CUFFED NSTRL (MISCELLANEOUS) ×2 IMPLANT
POUCH SPECIMEN RETRIEVAL 10MM (ENDOMECHANICALS) ×2 IMPLANT
PROTECTOR NERVE ULNAR (MISCELLANEOUS) ×3 IMPLANT
SEAL CANN UNIV 5-8 DVNC XI (MISCELLANEOUS) ×3 IMPLANT
SEAL XI 5MM-8MM UNIVERSAL (MISCELLANEOUS) ×3
SET CYSTO W/LG BORE CLAMP LF (SET/KITS/TRAYS/PACK) IMPLANT
SET TRI-LUMEN FLTR TB AIRSEAL (TUBING) ×2 IMPLANT
SUT VIC AB 0 CT1 27 (SUTURE) ×4
SUT VIC AB 0 CT1 27XBRD ANBCTR (SUTURE) ×2 IMPLANT
SUT VICRYL 0 UR6 27IN ABS (SUTURE) ×2 IMPLANT
SUT VICRYL RAPIDE 4/0 PS 2 (SUTURE) ×4 IMPLANT
SUT VLOC 180 0 9IN  GS21 (SUTURE)
SUT VLOC 180 0 9IN GS21 (SUTURE) IMPLANT
TIP RUMI ORANGE 6.7MMX12CM (TIP) IMPLANT
TIP UTERINE 5.1X6CM LAV DISP (MISCELLANEOUS) IMPLANT
TIP UTERINE 6.7X10CM GRN DISP (MISCELLANEOUS) IMPLANT
TIP UTERINE 6.7X6CM WHT DISP (MISCELLANEOUS) IMPLANT
TIP UTERINE 6.7X8CM BLUE DISP (MISCELLANEOUS) ×1 IMPLANT
TOWEL OR 17X24 6PK STRL BLUE (TOWEL DISPOSABLE) ×4 IMPLANT
TRENDGUARD 450 HYBRID PRO PACK (MISCELLANEOUS) ×2
TROCAR PORT AIRSEAL 8X120 (TROCAR) ×2 IMPLANT
WATER STERILE IRR 1000ML POUR (IV SOLUTION) ×2 IMPLANT

## 2018-05-13 NOTE — Anesthesia Postprocedure Evaluation (Signed)
Anesthesia Post Note  Patient: [name]  Procedure(s) Performed: XI ROBOTIC ASSISTED SALPINGO OOPHORECTOMY (Bilateral )     Patient location during evaluation: PACU Anesthesia Type: General Level of consciousness: awake and alert Pain management: pain level controlled Vital Signs Assessment: post-procedure vital signs reviewed and stable Respiratory status: spontaneous breathing, nonlabored ventilation, respiratory function stable and patient connected to nasal cannula oxygen Cardiovascular status: blood pressure returned to baseline and stable Postop Assessment: no apparent nausea or vomiting Anesthetic complications: no    Last Vitals:  Vitals:   05/13/18 1012 05/13/18 1015  BP:    Pulse: 90 81  Resp: 20 17  Temp:    SpO2: 100% 100%    Last Pain:  Vitals:   05/13/18 0922  TempSrc:   PainSc: 4                  Jonerik Sliker

## 2018-05-13 NOTE — H&P (Signed)
[name] is an 37 y.o. female. History of Ductal CA for Prophylactic BSO  Pertinent Gynecological History: Menses: flow is moderate Bleeding: dysfunctional uterine bleeding Contraception: none DES exposure: denies Blood transfusions: none Sexually transmitted diseases: no past history Previous GYN Procedures: DNC  Last mammogram: na Date: na Last pap: normal Date: 2019 OB History: G2, P2   Menstrual History: Menarche age: 64 Patient's last menstrual period was 05/08/2018.    Past Medical History:  Diagnosis Date  . Anxiety   . Breast cancer, left ([facility]) 07/2017  . Family history of breast cancer   . Family history of colon cancer     Past Surgical History:  Procedure Laterality Date  . BREAST LUMPECTOMY WITH RADIOACTIVE SEED AND SENTINEL LYMPH NODE BIOPSY Left 08/16/2017   Procedure: BREAST LUMPECTOMY WITH RADIOACTIVE SEED AND SENTINEL LYMPH NODE BIOPSY;  Surgeon: Excell Seltzer, MD;  Location: [facility];  Service: General;  Laterality: Left;  . BREAST LUMPECTOMY WITH SENTINEL LYMPH NODE BIOPSY Left 08/16/2017  . BREAST REDUCTION SURGERY Bilateral 08/23/2017   Procedure: LEFT ONCOPLASTIC REDUCTION, RIGHT BREAST REDUCTION;  Surgeon: Irene Limbo, MD;  Location: [facility];  Service: Plastics;  Laterality: Bilateral;  . CESAREAN SECTION  03/15/2012   Procedure: CESAREAN SECTION;  Surgeon: Lovenia Kim, MD;  Location: [facility];  Service: Obstetrics;  Laterality: N/A;  Primary cesarean section with delivery of baby girl at 107. Apgars 4/7.  Marland Kitchen CESAREAN SECTION WITH BILATERAL TUBAL LIGATION Bilateral 09/03/2015   Procedure: REPEAT CESAREAN SECTION WITH BILATERAL TUBAL LIGATION;  Surgeon: Brien Few, MD;  Location: [facility];  Service: Obstetrics;  Laterality: Bilateral;  EDD: 09/10/15  . CHOLECYSTECTOMY  12/07/2011   Procedure: LAPAROSCOPIC CHOLECYSTECTOMY;  Surgeon: Harl Bowie, MD;  Location: [facility];  Service: General;   Laterality: N/A;  . PORTACATH PLACEMENT Right 09/11/2017   Procedure: INSERTION PORT-A-CATH;  Surgeon: Excell Seltzer, MD;  Location: [facility];  Service: General;  Laterality: Right;  . WISDOM TOOTH EXTRACTION  2002    Family History  Problem Relation Age of Onset  . Diabetes Mother   . Cancer Mother 73       chondrosarcoma  . Breast cancer Mother 1       DCIS vs Atypical Hyperplasia  . Breast cancer Other        Maternal Great Aunt; MGMs sister  . Breast cancer Other        Maternal Great Aunt  . Other Maternal Grandmother        d. from complications of a fall and breaking her hip  . Alcohol abuse Maternal Grandfather   . Colon cancer Paternal Grandmother     Social History:  reports that she quit smoking about 3 years ago. She smoked 0.00 packs per day. She has never used smokeless tobacco. She reports previous alcohol use. She reports previous drug use.  Allergies: No Known Allergies  Medications Prior to Admission  Medication Sig Dispense Refill Last Dose  . CLOTRIMAZOLE-BETAMETHASONE EX Apply 1 application topically at bedtime. Applied at night   05/12/2018 at Unknown time  . escitalopram (LEXAPRO) 20 MG tablet Take 20 mg by mouth daily with supper.    05/12/2018 at Unknown time  . lidocaine-prilocaine (EMLA) cream Apply to affected area once (Patient not taking: Reported on 05/06/2018) 30 g 3 Not Taking at Unknown time    Review of Systems  Constitutional: Negative.   All other systems reviewed and are negative.   Blood pressure 116/72, pulse  62, temperature 98.2 F (36.8 C), temperature source Oral, resp. rate 16, height 5\' 6"  (1.676 m), weight 105.9 kg, last menstrual period 05/08/2018, SpO2 100 %. Physical Exam  Nursing note and vitals reviewed. Constitutional: She is oriented to person, place, and time. She appears well-developed and well-nourished.  HENT:  Head: Normocephalic and atraumatic.  Neck: Normal range of motion. Neck supple.  Cardiovascular:  Normal rate and regular rhythm.  Respiratory: Effort normal and breath sounds normal.  GI: Soft. Bowel sounds are normal.  Genitourinary:    Vagina and uterus normal.   Musculoskeletal: Normal range of motion.  Neurological: She is alert and oriented to person, place, and time. She has normal reflexes.  Skin: Skin is warm.  Psychiatric: She has a normal mood and affect.    No results found for this or any previous visit (from the past 24 hour(s)).  No results found.  Assessment/Plan: Invasive Ductal breast CA daVinci assisted Prophylactic BSO Surgery consent done HP dictated  [name] J 05/13/2018, 6:34 AM

## 2018-05-13 NOTE — H&P (Signed)
[name], ZALAR MEDICAL RECORD [id number] ACCOUNT [id number] DATE OF BIRTH:[birth year] FACILITY: [facility] LOCATION: [facility] PH[facility]ICIAN:Acie Custis J. Ronita Hipps, MD  HISTORY AND PHYSICAL  DATE OF ADMISSION:  05/13/2018  CHIEF COMPLAINT:  Prophylactic BSO for history of breast cancer.   HISTORY OF PRESENT ILLNESS: A 37 year old white female G2 P2 status post C-section x2 and tubal ligation who presents now for surgical intervention due to aforementioned indications.  ALLERGIES:  She has no known drug allergies.  MEDICATIONS:  Lexapro.  PAST SURGICAL HISTORY:  Cholecystectomy while pregnant in 2013, C-section x2 with tubal ligation, lumpectomy 2019, wisdom tooth removal.  FAMILY HISTORY:  Lung cancer, diabetes, congenital heart disease.  PHYSICAL EXAMINATION: GENERAL:  Well-developed, well-nourished white female in no acute distress. HEENT:  Normal. NECK:  Supple, full range of motion. LUNGS:  Clear. HEART:  Regular rhythm. ABDOMEN:  Soft, nontender. PELVIC:  Reveals normal size uterus and no adnexal masses. EXTREMITIES:  No cords. NEUROLOGIC:  Nonfocal. SKIN:  Intact.  IMPRESSION:   1. History of invasive ductal carcinoma, ER/PR positive. 2.  Prophylactic bilateral salpingo-oophorectomy.  PLAN:  Proceed with da Vinci assisted prophylactic BSO, possible lysis of adhesions.  Risks of anesthesia, infection, bleeding, injury to surrounding organs, possible need for repair discussed, delayed versus immediate complications including bowel and  bladder injury noted.  The patient acknowledges and wished to proceed.  LN/NUANCE  D:05/12/2018 T:05/12/2018 JOB:[id number]

## 2018-05-13 NOTE — Brief Op Note (Signed)
05/13/2018  9:16 AM  PATIENT:  [name]  37 y.o. female  PRE-OPERATIVE DIAGNOSIS:  Breast Cancer  POST-OPERATIVE DIAGNOSIS:  Breast Cancer  PROCEDURE:  Procedure(s) with comments: XI ROBOTIC ASSISTED SALPINGO OOPHORECTOMY (Bilateral) - Requests 90 min.  LYSIS OF PERIADNEXAL ADHESIONS  SURGEON:  Surgeon(s) and Role:    * Brien Few, MD - Primary    * Servando Salina, MD - Assisting  ASSISTANTS: Cousins MD   ANESTHESIA:   local and general  EBL:  30 mL   BLOOD ADMINISTERED:none  DRAINS: Urinary Catheter (Foley)   LOCAL MEDICATIONS USED:  MARCAINE    and Amount: 20 ml  SPECIMEN:  Source of Specimen:  BILATERAL TUBAL SEGMENTS AND OVARIES  DISPOSITION OF SPECIMEN:  PATHOLOGY  COUNTS:  YES  TOURNIQUET:  * No tourniquets in log *  DICTATION: .Other Dictation: Dictation Number [id number]  PLAN OF CARE: Discharge to home after PACU  PATIENT DISPOSITION:  PACU - hemodynamically stable.   Delay start of Pharmacological VTE agent (>24hrs) due to surgical blood loss or risk of bleeding: not applicable

## 2018-05-13 NOTE — Op Note (Signed)
[name], [name] MEDICAL RECORD [id number] ACCOUNT [id number] DATE OF BIRTH:[birth year] FACILITY: [facility] LOCATION: [facility] PH[facility]ICIAN:Sabrina Rainville J. Ronita Hipps, MD  OPERATIVE REPORT  DATE OF PROCEDURE:  05/13/2018  CHIEF COMPLAINT:  History of invasive ductal breast carcinoma for prophylactic bilateral salpingo-oophorectomy.  PROCEDURE:  Da Vinci-assisted bilateral salpingo-oophorectomy with lysis of left adnexal adhesions.  SURGEON:  Sabrina Few, MD  ASSISTANTGarwin [name].  ANESTHESIA:  General and local.  ESTIMATED BLOOD LOSS:  30 mL.  COMPLICATIONS:  None.  DRAINS:  Foley.  COUNTS:  Correct.  DISPOSITION:  The patient to recovery in good condition.  BRIEF OPERATIVE NOTE:  After being apprised of the risks of anesthesia, infection, bleeding, and surrounding organs, possible need for repair, delayed versus immediate complications including bowel and bladder injury, possible need for repair, inability  to prevent future cancer, the patient was brought to the operating room and was administered a general anesthetic without complications.  Prepped and draped in usual sterile fashion.  Foley catheter placed.  Exam under anesthesia revealing an anteflexed  uterus and no adnexal masses.  A RUMI retractor was placed vaginally without difficulty.  At this time, an infraumbilical incision was made with a scalpel.  A Veress needle was placed.  Opening pressure -2.  Four liters of CO2 insufflated without  difficulty.  Trocar placed atraumatically.  Pictures taken.  Absent gallbladder, normal liver.  Diaphragm noted.  Appendix not seen.  Normal anterior and posterior cul-de-sac.  Adhesions to the previous tied tubal segments which are still divided  bilaterally and noted on the right and left side.  The robotic ports were then established on the right and left with an 8 mm AirSeal port as well after achieving deep Trendelenburg position.  The robot was docked in the standard fashion.   AmniSure  bipolar forceps were placed.  At this time, adhesions were lysed sharply bilaterally to the periadnexal area to the distal tubal segments, and the distal tubal segments undermined and excised using monopolar and bipolar cautery and removed through the  Childress port.  The retroperitoneal space was then entered on the left.  The infundibulopelvic ligament was skeletonized at the level of the pelvic brim and cauterized using bipolar cautery and divided.  Similarly, the left tubo-ovarian ligament was  divided, and the specimen was placed in the anterior cul-de-sac on the right side after removal of the distal segment of tube and freeing from adhesions.  This distal segment was removed after undermining the mesosalpinx and dividing using monopolar and  bipolar cautery through the accessory port.  The retroperitoneal space was entered.  The ureter was identified.  Infundibulopelvic ligament was skeletonized, cauterized and divided using bipolar cautery and sharp dissection  at the pelvic brim.  The  tubo-ovarian ligament was divided at the level of the uterus as well.  The specimen was placed in the anterior cul-de-sac.  There is evidence of a free staple that was implanted in the cul-de-sac, likely from the patient's laparoscopic cholecystectomy.   This was removed without difficulty.  Otherwise, the anterior cul-de-sac appears normal.  At this time, the robot was undocked.  The EndoCatch was placed through the camera port with relocation of the camera to the right lower quadrant.  The specimens  were placed individually into the EndoCatch and removed through the umbilical port without difficulty.  At this point, good hemostasis was noted.  CO2 was released.  All instruments were removed under direct visualization.  Dilute bupivacaine solution  was placed in the pelvis.  Dilute Marcaine solution was placed in all incisions after CO2 was released.  Incisions closed using 4-0 Vicryl.  Dermabond was  placed.  RUMI retractor was removed from the vagina.  The patient tolerated the procedure well and  was transferred to recovery in good condition.  LN/NUANCE  D:05/13/2018 T:05/13/2018 JOB:[id number]

## 2018-05-13 NOTE — Anesthesia Procedure Notes (Signed)
Procedure Name: Intubation Date/Time: 05/13/2018 7:52 AM Performed by: Garrel Ridgel, CRNA Pre-anesthesia Checklist: Patient identified, Emergency Drugs available, Suction available, Patient being monitored and Timeout performed Patient Re-evaluated:Patient Re-evaluated prior to induction Oxygen Delivery Method: Circle system utilized Preoxygenation: Pre-oxygenation with 100% oxygen Induction Type: IV induction Ventilation: Mask ventilation without difficulty Laryngoscope Size: Mac and 3 Grade View: Grade I Tube type: Oral Tube size: 7.0 mm Number of attempts: 1 Airway Equipment and Method: Stylet Placement Confirmation: ETT inserted through vocal cords under direct vision Secured at: 22 cm Tube secured with: Tape Dental Injury: Teeth and Oropharynx as per pre-operative assessment

## 2018-05-13 NOTE — Transfer of Care (Signed)
Immediate Anesthesia Transfer of Care Note  Patient: [name]  Procedure(s) Performed: XI ROBOTIC ASSISTED SALPINGO OOPHORECTOMY (Bilateral )  Patient Location: PACU  Anesthesia Type:General  Level of Consciousness: awake, alert  and oriented  Airway & Oxygen Therapy: Patient Spontanous Breathing and Patient connected to face mask oxygen  Post-op Assessment: Report given to RN and Post -op Vital signs reviewed and stable  Post vital signs: Reviewed and stable  Last Vitals:  Vitals Value Taken Time  BP 127/74 05/13/2018  9:22 AM  Temp    Pulse 90 05/13/2018  9:25 AM  Resp 19 05/13/2018  9:25 AM  SpO2 100 % 05/13/2018  9:25 AM  Vitals shown include unvalidated device data.  Last Pain:  Vitals:   05/13/18 0612  TempSrc:   PainSc: 0-No pain         Complications: No apparent anesthesia complications

## 2018-05-13 NOTE — Discharge Instructions (Signed)
General Anesthesia, Adult, Care After °These instructions provide you with information about caring for yourself after your procedure. Your health care provider may also give you more specific instructions. Your treatment has been planned according to current medical practices, but problems sometimes occur. Call your health care provider if you have any problems or questions after your procedure. °What can I expect after the procedure? °After the procedure, it is common to have: °· Vomiting. °· A sore throat. °· Mental slowness. ° °It is common to feel: °· Nauseous. °· Cold or shivery. °· Sleepy. °· Tired. °· Sore or achy, even in parts of your body where you did not have surgery. ° °Follow these instructions at home: °For at least 24 hours after the procedure: °· Do not: °? Participate in activities where you could fall or become injured. °? Drive. °? Use heavy machinery. °? Drink alcohol. °? Take sleeping pills or medicines that cause drowsiness. °? Make important decisions or sign legal documents. °? Take care of children on your own. °· Rest. °Eating and drinking °· If you vomit, drink water, juice, or soup when you can drink without vomiting. °· Drink enough fluid to keep your urine clear or pale yellow. °· Make sure you have little or no nausea before eating solid foods. °· Follow the diet recommended by your health care provider. °General instructions °· Have a responsible adult stay with you until you are awake and alert. °· Return to your normal activities as told by your health care provider. Ask your health care provider what activities are safe for you. °· Take over-the-counter and prescription medicines only as told by your health care provider. °· If you smoke, do not smoke without supervision. °· Keep all follow-up visits as told by your health care provider. This is important. °Contact a health care provider if: °· You continue to have nausea or vomiting at home, and medicines are not helpful. °· You  cannot drink fluids or start eating again. °· You cannot urinate after 8-12 hours. °· You develop a skin rash. °· You have fever. °· You have increasing redness at the site of your procedure. °Get help right away if: °· You have difficulty breathing. °· You have chest pain. °· You have unexpected bleeding. °· You feel that you are having a life-threatening or urgent problem. °This information is not intended to replace advice given to you by your health care provider. Make sure you discuss any questions you have with your health care provider. °Document Released: 08/20/2000 Document Revised: 10/17/2015 Document Reviewed: 04/28/2015 °Elsevier Interactive Patient Education © 2018 [employer] ° °

## 2018-05-13 NOTE — Progress Notes (Signed)
Patient seen and examined. Consent witnessed and signed. No changes noted. Update completed. BP 116/72   Pulse 68   Temp 98.2 F (36.8 C) (Oral)   Resp 16   Ht 5\' 6"  (1.676 m)   Wt 105.9 kg   LMP 05/08/2018 Comment: 05/08/18 negative serum preg.  SpO2 97%   BMI 37.67 kg/m   CBC    Component Value Date/Time   WBC 5.4 05/08/2018 1000   RBC 4.04 05/08/2018 1000   HGB 12.3 05/08/2018 1000   HGB 11.7 01/31/2018 0843   HCT 36.4 05/08/2018 1000   PLT 201 05/08/2018 1000   PLT 234 01/31/2018 0843   MCV 90.1 05/08/2018 1000   MCH 30.4 05/08/2018 1000   MCHC 33.8 05/08/2018 1000   RDW 12.6 05/08/2018 1000   LYMPHSABS 1.3 01/31/2018 0843   MONOABS 0.3 01/31/2018 0843   EOSABS 0.1 01/31/2018 0843   BASOSABS 0.1 01/31/2018 0843

## 2018-05-14 ENCOUNTER — Encounter (HOSPITAL_BASED_OUTPATIENT_CLINIC_OR_DEPARTMENT_OTHER): Payer: Self-pay | Admitting: Obstetrics and Gynecology

## 2018-05-19 DIAGNOSIS — C50212 Malignant neoplasm of upper-inner quadrant of left female breast: Secondary | ICD-10-CM | POA: Diagnosis not present

## 2018-05-20 DIAGNOSIS — Z853 Personal history of malignant neoplasm of breast: Secondary | ICD-10-CM | POA: Diagnosis not present

## 2018-05-20 DIAGNOSIS — C50212 Malignant neoplasm of upper-inner quadrant of left female breast: Secondary | ICD-10-CM | POA: Diagnosis not present

## 2018-05-20 DIAGNOSIS — Z9889 Other specified postprocedural states: Secondary | ICD-10-CM | POA: Diagnosis not present

## 2018-05-20 DIAGNOSIS — Z923 Personal history of irradiation: Secondary | ICD-10-CM | POA: Diagnosis not present

## 2018-05-22 DIAGNOSIS — C50212 Malignant neoplasm of upper-inner quadrant of left female breast: Secondary | ICD-10-CM | POA: Diagnosis not present

## 2018-05-23 DIAGNOSIS — C50212 Malignant neoplasm of upper-inner quadrant of left female breast: Secondary | ICD-10-CM | POA: Diagnosis not present

## 2018-05-29 DIAGNOSIS — Z09 Encounter for follow-up examination after completed treatment for conditions other than malignant neoplasm: Secondary | ICD-10-CM | POA: Diagnosis not present

## 2018-06-02 ENCOUNTER — Encounter: Payer: Self-pay | Admitting: *Deleted

## 2018-06-02 ENCOUNTER — Inpatient Hospital Stay: Payer: BLUE CROSS/BLUE SHIELD | Attending: Hematology and Oncology | Admitting: Hematology and Oncology

## 2018-06-02 DIAGNOSIS — Z79811 Long term (current) use of aromatase inhibitors: Secondary | ICD-10-CM | POA: Insufficient documentation

## 2018-06-02 DIAGNOSIS — Z170 Estrogen receptor positive status [ER+]: Principal | ICD-10-CM

## 2018-06-02 DIAGNOSIS — Z79899 Other long term (current) drug therapy: Secondary | ICD-10-CM | POA: Diagnosis not present

## 2018-06-02 DIAGNOSIS — C50212 Malignant neoplasm of upper-inner quadrant of left female breast: Secondary | ICD-10-CM

## 2018-06-02 DIAGNOSIS — Z9221 Personal history of antineoplastic chemotherapy: Secondary | ICD-10-CM | POA: Diagnosis not present

## 2018-06-02 DIAGNOSIS — Z90722 Acquired absence of ovaries, bilateral: Secondary | ICD-10-CM | POA: Diagnosis not present

## 2018-06-02 DIAGNOSIS — Z923 Personal history of irradiation: Secondary | ICD-10-CM | POA: Diagnosis not present

## 2018-06-02 MED ORDER — ANASTROZOLE 1 MG PO TABS: 1.0000 mg | ORAL_TABLET | Freq: Every day | ORAL | 3 refills | Status: DC

## 2018-06-02 NOTE — Progress Notes (Signed)
Patient Care Team: Jonathon Jordan, MD as PCP - General (Family Medicine)  DIAGNOSIS:  Encounter Diagnosis  Name Primary?  . Malignant neoplasm of upper-inner quadrant of left breast in female, estrogen receptor positive ([place])     SUMMARY OF ONCOLOGIC HISTORY:   Malignant neoplasm of upper-inner quadrant of left breast in female, estrogen receptor positive ([place])   07/24/2017 Initial Diagnosis    Left breast palpable lump upper inner quadrant 11 o'clock position: Ill-defined 1.6 cm mass, axilla ultrasound negative, ultrasound-guided biopsy: Grade 2 IDC ER 90%, PR 40%, Ki-67 15%, HER-2 negative    08/09/2017 Genetic Testing    Negative genetic testing common hereditary cancer panel.  The Hereditary Gene Panel offered by [facility] includes sequencing and/or deletion duplication testing of the following 47 genes: APC, ATM, AXIN2, BARD1, BMPR1A, BRCA1, BRCA2, BRIP1, CDH1, CDK4, CDKN2A (p14ARF), CDKN2A (p16INK4a), CHEK2, CTNNA1, DICER1, EPCAM (Deletion/duplication testing only), GREM1 (promoter region deletion/duplication testing only), KIT, MEN1, MLH1, MSH2, MSH3, MSH6, MUTYH, NBN, NF1, NHTL1, PALB2, PDGFRA, PMS2, POLD1, POLE, PTEN, RAD50, RAD51C, RAD51D, SDHB, SDHC, SDHD, SMAD4, SMARCA4. STK11, TP53, TSC1, TSC2, and VHL.  The following genes were evaluated for sequence changes only: SDHA and HOXB13 c.251G>A variant only. The report date is August 07, 2017.    08/16/2017 Surgery    Left lumpectomy: IDC grade 2, 2.5 cm, DCIS intermediate grade, lymphovascular invasion identified, margins negative, 1/2 lymph nodes positive with extracapsular extension, ER 90%, PR 40%, HER-2 negative ratio 1.15, Ki-67 15% T2 N1a stage II a; Mammaprint high risk     08/16/2017 Miscellaneous    MammaPrint 08/16/17  High risk with average 10-year Risk of recurrence Untreated: 29% MPI: -0.399 94.6% benefit of chemotherapy and Hormonal therapy at 5 years from distant recurrence.     09/12/2017 - 01/31/2018 Chemotherapy   Adjuvant chemotherapy with dose dense Adriamycin and Cytoxan x4 followed by Taxol weekly x12     02/24/2018 - 04/09/2018 Radiation Therapy    Radiation therapy with Dr. Lisbeth Renshaw 02/24/18-04/09/18    05/13/2018 Surgery    Bilateral salpingo-oophorectomy with lysis of left adnexal adhesions     CHIEF COMPLIANT: Follow-up to discuss antiestrogen treatment plan  INTERVAL HISTORY: [name] is a 38 year old with above-mentioned history of left breast cancer treated with a left lumpectomy followed by adjuvant chemotherapy and radiation and she underwent bilateral salpingo-oophorectomy.  She is here today to talk about antiestrogen treatment plan.  She is recovered very well from the prior radiation and chemotherapy.  Apart from intermittent left breast discomfort with occasional sharp pains she is done extremely well.  REVIEW OF SYSTEMS:   Constitutional: Denies fevers, chills or abnormal weight loss Eyes: Denies blurriness of vision Ears, nose, mouth, throat, and face: Denies mucositis or sore throat Respiratory: Denies cough, dyspnea or wheezes Cardiovascular: Denies palpitation, chest discomfort Gastrointestinal:  Denies nausea, heartburn or change in bowel habits Skin: Denies abnormal skin rashes Lymphatics: Denies new lymphadenopathy or easy bruising Neurological:Denies numbness, tingling or new weaknesses Behavioral/Psych: Mood is stable, no new changes  Extremities: No lower extremity edema Breast: Very occasional left breast discomfort intermittent sharp pains.  All other systems were reviewed with the patient and are negative.  I have reviewed the past medical history, past surgical history, social history and family history with the patient and they are unchanged from previous note.  ALLERGIES:  has No Known Allergies.  MEDICATIONS:  Current Outpatient Medications  Medication Sig Dispense Refill  . anastrozole (ARIMIDEX) 1 MG tablet Take 1 tablet (1 mg total)  by mouth  daily. 90 tablet 3  . CLOTRIMAZOLE-BETAMETHASONE EX Apply 1 application topically at bedtime. Applied at night    . escitalopram (LEXAPRO) 20 MG tablet Take 20 mg by mouth daily with supper.     . lidocaine-prilocaine (EMLA) cream Apply to affected area once (Patient not taking: Reported on 05/06/2018) 30 g 3  . oxyCODONE (OXY IR/ROXICODONE) 5 MG immediate release tablet Take 1 tablet (5 mg total) by mouth every 6 (six) hours as needed for severe pain. 20 tablet 0   No current facility-administered medications for this visit.    Facility-Administered Medications Ordered in Other Visits  Medication Dose Route Frequency Provider Last Rate Last Dose  . heparin lock flush 100 unit/mL  500 Units Intracatheter Once PRN Nicholas Lose, MD      . sodium chloride flush (NS) 0.9 % injection 10 mL  10 mL Intracatheter PRN Nicholas Lose, MD   10 mL at 11/15/17 1449  . sodium chloride flush (NS) 0.9 % injection 10 mL  10 mL Intracatheter PRN Nicholas Lose, MD      . sodium chloride flush (NS) 0.9 % injection 10 mL  10 mL Intracatheter PRN Nicholas Lose, MD   10 mL at 12/13/17 1500    PHYSICAL EXAMINATION: ECOG PERFORMANCE STATUS: 1 - Symptomatic but completely ambulatory  Vitals:   06/02/18 0937  BP: 119/68  Pulse: 72  Resp: 17  Temp: 98.4 F (36.9 C)  SpO2: 100%   Filed Weights   06/02/18 0937  Weight: 239 lb 1.6 oz (108.5 kg)    GENERAL:alert, no distress and comfortable SKIN: skin color, texture, turgor are normal, no rashes or significant lesions EYES: normal, Conjunctiva are pink and non-injected, sclera clear OROPHARYNX:no exudate, no erythema and lips, buccal mucosa, and tongue normal  NECK: supple, thyroid normal size, non-tender, without nodularity LYMPH:  no palpable lymphadenopathy in the cervical, axillary or inguinal LUNGS: clear to auscultation and percussion with normal breathing effort HEART: regular rate & rhythm and no murmurs and no lower extremity edema ABDOMEN:abdomen  soft, non-tender and normal bowel sounds MUSCULOSKELETAL:no cyanosis of digits and no clubbing  NEURO: alert & oriented x 3 with fluent speech, no focal motor/sensory deficits EXTREMITIES: No lower extremity edema   LABORATORY DATA:  I have reviewed the data as listed CMP Latest Ref Rng & Units 01/31/2018 01/24/2018 01/17/2018  Glucose 70 - 99 mg/dL 141(H) 161(H) 133(H)  BUN 6 - 20 mg/dL '13 10 11  '$ Creatinine 0.44 - 1.00 mg/dL 0.83 0.83 0.79  Sodium 135 - 145 mmol/L 140 141 142  Potassium 3.5 - 5.1 mmol/L 3.9 3.9 4.1  Chloride 98 - 111 mmol/L 106 106 107  CO2 22 - 32 mmol/L '24 26 25  '$ Calcium 8.9 - 10.3 mg/dL 8.8(L) 8.9 8.8(L)  Total Protein 6.5 - 8.1 g/dL 6.4(L) 6.3(L) 6.3(L)  Total Bilirubin 0.3 - 1.2 mg/dL 0.5 0.4 0.3  Alkaline Phos 38 - 126 U/L 76 72 87  AST 15 - 41 U/L 32 33 31  ALT 0 - 44 U/L 45(H) 51(H) 51(H)    Lab Results  Component Value Date   WBC 5.4 05/08/2018   HGB 12.3 05/08/2018   HCT 36.4 05/08/2018   MCV 90.1 05/08/2018   PLT 201 05/08/2018   NEUTROABS 3.9 01/31/2018    ASSESSMENT & PLAN:  Malignant neoplasm of upper-inner quadrant of left breast in female, estrogen receptor positive ([facility]) 08/19/2017:Left lumpectomy: IDC grade 2, 2.5 cm, DCIS intermediate grade, lymphovascular invasion identified, margins negative, 1/2  lymph nodes positive with extracapsular extension, ER 90%, PR 40%, HER-2 negative ratio 1.15, Ki-67 15% T2 N1a stage II a; Mammaprint high risk   Recommendation: 1. Adjuvant chemotherapy with dose dense Adriamycin and Cytoxan x4 followed by Taxol weekly x12 completed 01/31/2018 2. followed by adjuvant radiation therapy which will be completed 04/09/2018 3.  Bilateral salpingo-oophorectomy 05/15/2018 4. Followed by adjuvant antiestrogen therapy;will enroll the patient in Natalee clinical trial _______________________________________________________________________________________________  Surveillance: We briefly discussed that we may consider  MRI surveillance in addition to mammograms. She is very claustrophobic and may require sedation if the decide to do an MRI.  Current treatment: Natalee clinical trial enrollment will be discussed today, letrozole 2.5 mg daily started 06/02/2018 NATALEE clinical trial: Phase 3 clinical trial in patients or estrogen receptor positive randomized between standard antiestrogen therapy versus antiestrogen therapy with ribociclib 400 mg for 36 months.   Ribociclib: I discussed the risks and benefits of Ribociclib including myelosuppression especially neutropenia and with that risk of infection, there is risk of pulmonary embolism and mild peripheral neuropathy as well. Fatigue, nausea, diarrhea, decreased appetite as well as alopecia and thrombocytopenia are also potential side effects of Ribociclib.  DEXA scan and mammogram to be done at [facility] will be done in August 2020 with anesthesia help  Return to clinic based upon clinical trial requirements.  No orders of the defined types were placed in this encounter.  The patient has a good understanding of the overall plan. she agrees with it. she will call with any problems that may develop before the next visit here.   Harriette Ohara, MD 06/02/18

## 2018-06-02 NOTE — Assessment & Plan Note (Signed)
08/19/2017:Left lumpectomy: IDC grade 2, 2.5 cm, DCIS intermediate grade, lymphovascular invasion identified, margins negative, 1/2 lymph nodes positive with extracapsular extension, ER 90%, PR 40%, HER-2 negative ratio 1.15, Ki-67 15% T2 N1a stage II a; Mammaprint high risk   Recommendation: 1. Adjuvant chemotherapy with dose dense Adriamycin and Cytoxan x4 followed by Taxol weekly x12 completed 01/31/2018 2. followed by adjuvant radiation therapy which will be completed 04/09/2018 3.  Bilateral salpingo-oophorectomy 05/15/2018 4. Followed by adjuvant antiestrogen therapy;will enroll the patient in Natalee clinical trial _______________________________________________________________________________________________  Surveillance: We briefly discussed that we may consider MRI surveillance in addition to mammograms. She is very claustrophobic and may require sedation if the decide to do an MRI.  Current treatment: Natalee clinical trial enrollment will be discussed today, letrozole 2.5 mg daily started 06/02/2018 DEXA scan and mammogram to be done at [facility] will be done in August 2020 with anesthesia help  Return to clinic based upon clinical trial requirements.

## 2018-06-02 NOTE — Research (Signed)
06/02/2018 Research - [employer] TRIO033/NATALEE study A PHASE III, MULTICENTER, RANDOMIZED, OPEN-LABEL TRIAL TO EVALUATE EFFICACY AND SAFETY OF RIBOCICLIB WITH ENDOCRINE THERAPY AS AN ADJUVANT TREATMENT IN PATIENTS WITH HORMONE RECEPTOR POSITIVE, HER2-NEGATIVE, EARLY BREAST CANCER/NATALEE  Met with patient in clinic today x 15 minutes to provide an overview of the NATALEE trial, as requested by Dr. Lindi Adie. Patient is aware of the purpose of the study, the investigational and treatment arms, length of treatment and frequency of study visits. Research nurse provided patient with a copy of the IRB-approved informed consent form (ICF version 2.0, as modified on [id number]), the [facility] clinical trials brochure and my business card. Plans made to contact patient by the end of the week following eligibility review. In the meantime, the patient will review the ICF and make note of any questions or concerns for further discussion. Patient is aware that a screening visit, or visits, will be scheduled to complete the informed consent process and required study procedures. Patient is in agreement with this plan. Cindy S. Brigitte Pulse BSN, RN, [place] 06/02/2018 11:24 AM

## 2018-06-04 ENCOUNTER — Other Ambulatory Visit: Payer: Self-pay

## 2018-06-04 ENCOUNTER — Encounter: Payer: Self-pay | Admitting: Radiation Oncology

## 2018-06-04 ENCOUNTER — Ambulatory Visit
Admission: RE | Admit: 2018-06-04 | Discharge: 2018-06-04 | Disposition: A | Discharge status: Home / Self Care | Payer: BLUE CROSS/BLUE SHIELD | Source: Ambulatory Visit | Attending: Radiation Oncology | Admitting: Radiation Oncology

## 2018-06-04 VITALS — BP 110/69 | HR 73 | Temp 98.1°F | Resp 20 | Ht 66.0 in | Wt 236.8 lb

## 2018-06-04 DIAGNOSIS — N6452 Nipple discharge: Secondary | ICD-10-CM | POA: Diagnosis not present

## 2018-06-04 DIAGNOSIS — Z170 Estrogen receptor positive status [ER+]: Secondary | ICD-10-CM | POA: Insufficient documentation

## 2018-06-04 DIAGNOSIS — Z79811 Long term (current) use of aromatase inhibitors: Secondary | ICD-10-CM | POA: Diagnosis not present

## 2018-06-04 DIAGNOSIS — Z79899 Other long term (current) drug therapy: Secondary | ICD-10-CM | POA: Diagnosis not present

## 2018-06-04 DIAGNOSIS — Z923 Personal history of irradiation: Secondary | ICD-10-CM | POA: Insufficient documentation

## 2018-06-04 DIAGNOSIS — C50912 Malignant neoplasm of unspecified site of left female breast: Secondary | ICD-10-CM | POA: Insufficient documentation

## 2018-06-04 DIAGNOSIS — C50212 Malignant neoplasm of upper-inner quadrant of left female breast: Secondary | ICD-10-CM

## 2018-06-04 NOTE — Progress Notes (Signed)
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 This was delivered using a 3-D conformal technique. The patient then received a boost to the seroma. This delivered an additional 10 Gy in 5 fractions using 6X, 15X photons with a Complex Isodose technique. The total dose was 60.4 Gy.  Narrative:  The patient returns today for routine follow-up. During treatment she did very well with radiotherapy and did not have significant desquamation.                             On review of systems, the patient states she's feeling quite well. She denies any concerns with her skin at this time.   ALLERGIES:  has No Known Allergies.  Meds: Current Outpatient Medications  Medication Sig Dispense Refill  . anastrozole (ARIMIDEX) 1 MG tablet Take 1 tablet (1 mg total) by mouth daily. 90 tablet 3  . CLOTRIMAZOLE-BETAMETHASONE EX Apply 1 application topically at bedtime. Applied at night    . escitalopram (LEXAPRO) 20 MG tablet Take 20 mg by mouth daily with supper.     . lidocaine-prilocaine (EMLA) cream Apply to affected area once 30 g 3  . oxyCODONE (OXY IR/ROXICODONE) 5 MG immediate release tablet Take 1 tablet (5 mg total) by mouth every 6 (six) hours as needed for severe pain. 20 tablet 0   No current facility-administered medications for this encounter.    Facility-Administered Medications Ordered in Other Encounters  Medication Dose Route Frequency Provider Last Rate Last Dose  . heparin lock flush 100  unit/mL  500 Units Intracatheter Once PRN Nicholas Lose, MD      . sodium chloride flush (NS) 0.9 % injection 10 mL  10 mL Intracatheter PRN Nicholas Lose, MD   10 mL at 11/15/17 1449  . sodium chloride flush (NS) 0.9 % injection 10 mL  10 mL Intracatheter PRN Nicholas Lose, MD      . sodium chloride flush (NS) 0.9 % injection 10 mL  10 mL Intracatheter PRN Nicholas Lose, MD   10 mL at 12/13/17 1500    Physical Findings:  height is 5\' 6"  (1.676 m) and weight is 236 lb 12.8 oz (107.4 kg). Her oral temperature is 98.1 F (36.7 C). Her blood pressure is 110/69 and her pulse is 73. Her respiration is 20 and oxygen saturation is 100%.  Pain Assessment Pain Score: 0-No pain/10 In general this is a well appearing caucasian female in no acute distress. She's alert and oriented x4 and appropriate throughout the examination. Cardiopulmonary assessment is negative for acute distress and she exhibits normal effort. The left breast was examined and reveals mild hyperpigmentation along the upper inner chest wall. Her left breast is slightly smaller than the right. No nipple bleeding or discharge is noted.  Lab Findings: Lab Results  Component Value Date   WBC 5.4 05/08/2018   HGB 12.3 05/08/2018   HCT 36.4 05/08/2018   MCV 90.1 05/08/2018  PLT 201 05/08/2018     Radiographic Findings: No results found.  Impression/Plan: 1. Stage IB,pT2N1aM0, grade 2, ER/PR positive invasive ductal carcinoma of the left breast. The patient has been doing well since completion of radiotherapy. We discussed that we would be happy to continue to follow her as needed, but she will also continue to follow up with Dr. Lindi Adie in medical oncology. She was counseled on skin care as well as measures to avoid sun exposure to this area.  2. Survivorship. We discussed the importance of survivorship evaluation and she is  currently scheduled for this in the near future. She was also given the monthly calendar for access to  resources offered within the cancer center. 3. Nipple discharge. This was not seen on exam today but I encouraged the patient to follow up with Dr. Iran Planas to see if this is typical following reconstruction she's had.    Carola Rhine, PAC

## 2018-06-06 ENCOUNTER — Telehealth: Payer: Self-pay | Admitting: *Deleted

## 2018-06-06 NOTE — Telephone Encounter (Signed)
06/06/2018 Left voice mail message for patient to contact me regarding potential for enrolling in the TRIO033/NATALEE research study. Explained to patient about potential drug interaction with Lexapro that needs to be discussed further, as well as other information regarding the conduct of the study. Requested that patient call me back at [phone] either before 2pm today, or next week. Cindy S. Brigitte Pulse BSN, RN, CCRP 06/06/2018 10:14 AM

## 2018-06-09 ENCOUNTER — Telehealth: Payer: Self-pay | Admitting: *Deleted

## 2018-06-09 NOTE — Telephone Encounter (Signed)
06/09/2018 Left voice mail message for patient, in response to her return call Friday afternoon 06/06/2018. Requested that patient contact me at my direct number [phone]. Cindy S. Brigitte Pulse BSN, RN, CCRP 06/09/2018 10:21 AM

## 2018-06-09 NOTE — Research (Signed)
06/09/2018 Spoke with patient by phone today (see prior Telephone Call encounters) regarding eligibility for the NATALEE trial noting that while for the most part, she did meet eligibility requirements, she is taking Lexapro which is a prohibited medication due to known risk for QT prolongation. Due to a long history of taking Lexapro, originally prescribed by a psychiatrist, as well as one instance of discontinuing the medication with difficulty, she is not in favor of trying to switch to another agent. Dr. Lindi Adie was notified of the patient's decision via In Basket message. Cindy S. Brigitte Pulse BSN, RN, CCRP 06/09/2018 4:45 PM

## 2018-06-13 NOTE — Research (Signed)
06/13/2018 Per Dr. Lindi Adie, prohibited medication was discussed with the patient who confirms that she does not want to switch from Lexapro to another medication. Dr. Lindi Adie stated that patient was interested in hearing about any other studies that she may qualify for. Will proceed with screening for other trials available here at [facility]. Cindy S. Brigitte Pulse BSN, RN, [facility] 06/13/2018 4:38 PM

## 2018-06-18 ENCOUNTER — Telehealth: Payer: Self-pay

## 2018-06-18 NOTE — Telephone Encounter (Signed)
Left VM. Introduced myself as a Nutritional therapist to patient. Informed patient that Dr. Lindi Adie had asked the research office to screen her for research studies and that she appeared eligible for other studies besides NATALEE and that I would be mailing the information to her. Gave her my office number and told her to call if she had questions. Thanked her for her time and interest. Betsy Coder, BSN Clinical Research  06/18/2018 [phone]

## 2018-06-27 ENCOUNTER — Telehealth: Payer: Self-pay | Admitting: *Deleted

## 2018-06-27 NOTE — Telephone Encounter (Signed)
Pt called with c/o bilateral joint pain her her knees. She is now limping d/t joint pain. Informed pt to stop taking Anastrozole and we will reassess in 2 wks. Scheduled and confirmed appt with dr. Lindi Adie on 2/17 at 8:30am. Denies further needs at this time

## 2018-07-01 ENCOUNTER — Encounter: Payer: Self-pay | Admitting: Hematology and Oncology

## 2018-07-01 DIAGNOSIS — Z853 Personal history of malignant neoplasm of breast: Secondary | ICD-10-CM | POA: Diagnosis not present

## 2018-07-01 DIAGNOSIS — Z90710 Acquired absence of both cervix and uterus: Secondary | ICD-10-CM | POA: Diagnosis not present

## 2018-07-01 DIAGNOSIS — Z780 Asymptomatic menopausal state: Secondary | ICD-10-CM | POA: Diagnosis not present

## 2018-07-01 DIAGNOSIS — E349 Endocrine disorder, unspecified: Secondary | ICD-10-CM | POA: Diagnosis not present

## 2018-07-14 ENCOUNTER — Inpatient Hospital Stay: Payer: BLUE CROSS/BLUE SHIELD | Attending: Hematology and Oncology | Admitting: Hematology and Oncology

## 2018-07-14 DIAGNOSIS — Z923 Personal history of irradiation: Secondary | ICD-10-CM | POA: Diagnosis not present

## 2018-07-14 DIAGNOSIS — Z79899 Other long term (current) drug therapy: Secondary | ICD-10-CM | POA: Diagnosis not present

## 2018-07-14 DIAGNOSIS — Z90722 Acquired absence of ovaries, bilateral: Secondary | ICD-10-CM | POA: Diagnosis not present

## 2018-07-14 DIAGNOSIS — Z79811 Long term (current) use of aromatase inhibitors: Secondary | ICD-10-CM | POA: Diagnosis not present

## 2018-07-14 DIAGNOSIS — Z9221 Personal history of antineoplastic chemotherapy: Secondary | ICD-10-CM

## 2018-07-14 DIAGNOSIS — C50212 Malignant neoplasm of upper-inner quadrant of left female breast: Secondary | ICD-10-CM

## 2018-07-14 DIAGNOSIS — Z170 Estrogen receptor positive status [ER+]: Principal | ICD-10-CM

## 2018-07-14 DIAGNOSIS — Z9079 Acquired absence of other genital organ(s): Secondary | ICD-10-CM | POA: Diagnosis not present

## 2018-07-14 MED ORDER — ESCITALOPRAM OXALATE 20 MG PO TABS: 20.0000 mg | ORAL_TABLET | Freq: Every day | ORAL | 3 refills | Status: DC

## 2018-07-14 MED ORDER — LETROZOLE 2.5 MG PO TABS: 2.5000 mg | ORAL_TABLET | Freq: Every day | ORAL | 3 refills | Status: DC

## 2018-07-14 NOTE — Progress Notes (Signed)
Patient Care Team: Jonathon Jordan, MD as PCP - General (Family Medicine) Benson Norway, RN as Registered Nurse (Oncology)  DIAGNOSIS:  Encounter Diagnosis  Name Primary?  . Malignant neoplasm of upper-inner quadrant of left breast in female, estrogen receptor positive ([place])     SUMMARY OF ONCOLOGIC HISTORY:   Malignant neoplasm of upper-inner quadrant of left breast in female, estrogen receptor positive ([place])   07/24/2017 Initial Diagnosis    Left breast palpable lump upper inner quadrant 11 o'clock position: Ill-defined 1.6 cm mass, axilla ultrasound negative, ultrasound-guided biopsy: Grade 2 IDC ER 90%, PR 40%, Ki-67 15%, HER-2 negative    08/09/2017 Genetic Testing    Negative genetic testing common hereditary cancer panel.  The Hereditary Gene Panel offered by [facility] includes sequencing and/or deletion duplication testing of the following 47 genes: APC, ATM, AXIN2, BARD1, BMPR1A, BRCA1, BRCA2, BRIP1, CDH1, CDK4, CDKN2A (p14ARF), CDKN2A (p16INK4a), CHEK2, CTNNA1, DICER1, EPCAM (Deletion/duplication testing only), GREM1 (promoter region deletion/duplication testing only), KIT, MEN1, MLH1, MSH2, MSH3, MSH6, MUTYH, NBN, NF1, NHTL1, PALB2, PDGFRA, PMS2, POLD1, POLE, PTEN, RAD50, RAD51C, RAD51D, SDHB, SDHC, SDHD, SMAD4, SMARCA4. STK11, TP53, TSC1, TSC2, and VHL.  The following genes were evaluated for sequence changes only: SDHA and HOXB13 c.251G>A variant only. The report date is August 07, 2017.    08/16/2017 Surgery    Left lumpectomy: IDC grade 2, 2.5 cm, DCIS intermediate grade, lymphovascular invasion identified, margins negative, 1/2 lymph nodes positive with extracapsular extension, ER 90%, PR 40%, HER-2 negative ratio 1.15, Ki-67 15% T2 N1a stage II a; Mammaprint high risk     08/16/2017 Miscellaneous    MammaPrint 08/16/17  High risk with average 10-year Risk of recurrence Untreated: 29% MPI: -0.399 94.6% benefit of chemotherapy and Hormonal therapy at 5 years from distant  recurrence.     09/12/2017 - 01/31/2018 Chemotherapy    Adjuvant chemotherapy with dose dense Adriamycin and Cytoxan x4 followed by Taxol weekly x12     02/24/2018 - 04/09/2018 Radiation Therapy    Radiation therapy with Dr. Lisbeth Renshaw 02/24/18-04/09/18    05/13/2018 Surgery    Bilateral salpingo-oophorectomy with lysis of left adnexal adhesions    05/28/2018 -  Anti-estrogen oral therapy    Anastrozole: Discontinued due to joint pains and stiffness, switched to letrozole 07/14/2018     CHIEF COMPLIANT: Inability to tolerate anastrozole therapy  INTERVAL HISTORY: [name] is a 38 year old above-mentioned history of left breast cancer treated with lumpectomy followed by adjuvant chemotherapy and radiation and bilateral salpingo-oophorectomy.  She is here today for discussion regarding her antiestrogen treatment plan.  She has been unable to tolerate anastrozole because of diffuse muscle aches and pains.  Symptoms improved immediately after stopping it.  She is here to discuss what other options she may have.  REVIEW OF SYSTEMS:   Constitutional: Denies fevers, chills or abnormal weight loss Eyes: Denies blurriness of vision Ears, nose, mouth, throat, and face: Denies mucositis or sore throat Respiratory: Denies cough, dyspnea or wheezes Cardiovascular: Denies palpitation, chest discomfort Gastrointestinal:  Denies nausea, heartburn or change in bowel habits Skin: Denies abnormal skin rashes Lymphatics: Denies new lymphadenopathy or easy bruising Neurological:Denies numbness, tingling or new weaknesses Behavioral/Psych: Mood is stable, no new changes  Extremities: No lower extremity edema Breast:  denies any pain or lumps or nodules in either breasts All other systems were reviewed with the patient and are negative.  I have reviewed the past medical history, past surgical history, social history and family history with the patient and  they are unchanged from previous note.  ALLERGIES:   has No Known Allergies.  MEDICATIONS:  Current Outpatient Medications  Medication Sig Dispense Refill  . CLOTRIMAZOLE-BETAMETHASONE EX Apply 1 application topically at bedtime. Applied at night    . escitalopram (LEXAPRO) 20 MG tablet Take 1 tablet (20 mg total) by mouth daily with supper. 90 tablet 3  . letrozole (FEMARA) 2.5 MG tablet Take 1 tablet (2.5 mg total) by mouth daily. 90 tablet 3   No current facility-administered medications for this visit.    Facility-Administered Medications Ordered in Other Visits  Medication Dose Route Frequency Provider Last Rate Last Dose  . heparin lock flush 100 unit/mL  500 Units Intracatheter Once PRN Nicholas Lose, MD      . sodium chloride flush (NS) 0.9 % injection 10 mL  10 mL Intracatheter PRN Nicholas Lose, MD   10 mL at 11/15/17 1449  . sodium chloride flush (NS) 0.9 % injection 10 mL  10 mL Intracatheter PRN Nicholas Lose, MD      . sodium chloride flush (NS) 0.9 % injection 10 mL  10 mL Intracatheter PRN Nicholas Lose, MD   10 mL at 12/13/17 1500    PHYSICAL EXAMINATION: ECOG PERFORMANCE STATUS: 1 - Symptomatic but completely ambulatory  Vitals:   07/14/18 0844  BP: (!) 111/58  Pulse: 60  Resp: 17  Temp: 98.1 F (36.7 C)  SpO2: 100%   Filed Weights   07/14/18 0844  Weight: 231 lb 8 oz (105 kg)    GENERAL:alert, no distress and comfortable SKIN: skin color, texture, turgor are normal, no rashes or significant lesions EYES: normal, Conjunctiva are pink and non-injected, sclera clear OROPHARYNX:no exudate, no erythema and lips, buccal mucosa, and tongue normal  NECK: supple, thyroid normal size, non-tender, without nodularity LYMPH:  no palpable lymphadenopathy in the cervical, axillary or inguinal LUNGS: clear to auscultation and percussion with normal breathing effort HEART: regular rate & rhythm and no murmurs and no lower extremity edema ABDOMEN:abdomen soft, non-tender and normal bowel sounds MUSCULOSKELETAL:no cyanosis  of digits and no clubbing  NEURO: alert & oriented x 3 with fluent speech, no focal motor/sensory deficits EXTREMITIES: No lower extremity edema   LABORATORY DATA:  I have reviewed the data as listed CMP Latest Ref Rng & Units 01/31/2018 01/24/2018 01/17/2018  Glucose 70 - 99 mg/dL 141(H) 161(H) 133(H)  BUN 6 - 20 mg/dL _0 Creatinine 0.44 - 1.00 mg/dL 0.83 0.83 0.79  Sodium 135 - 145 mmol/L 140 141 142  Potassium 3.5 - 5.1 mmol/L 3.9 3.9 4.1  Chloride 98 - 111 mmol/L 106 106 107  CO2 22 - 32 mmol/L _1 Calcium 8.9 - 10.3 mg/dL 8.8(L) 8.9 8.8(L)  Total Protein 6.5 - 8.1 g/dL 6.4(L) 6.3(L) 6.3(L)  Total Bilirubin 0.3 - 1.2 mg/dL 0.5 0.4 0.3  Alkaline Phos 38 - 126 U/L 76 72 87  AST 15 - 41 U/L 32 33 31  ALT 0 - 44 U/L 45(H) 51(H) 51(H)    Lab Results  Component Value Date   WBC 5.4 05/08/2018   HGB 12.3 05/08/2018   HCT 36.4 05/08/2018   MCV 90.1 05/08/2018   PLT 201 05/08/2018   NEUTROABS 3.9 01/31/2018    ASSESSMENT & PLAN:  Malignant neoplasm of upper-inner quadrant of left breast in female, estrogen receptor positive ([facility]) 08/19/2017:Left lumpectomy: IDC grade 2, 2.5 cm, DCIS intermediate grade, lymphovascular invasion identified, margins negative, 1/2 lymph nodes positive with extracapsular extension, ER 90%,  PR 40%, HER-2 negative ratio 1.15, Ki-67 15% T2 N1a stage II a; Mammaprint high risk   Recommendation: 1. Adjuvant chemotherapy with dose dense Adriamycin and Cytoxan x4 followed by Taxol weekly x12 completed 01/31/2018 2. followed by adjuvant radiation therapywhich will be completed 04/09/2018 3.  Bilateral salpingo-oophorectomy 05/15/2018 4. Followed by adjuvant antiestrogen therapy;will enroll the patient in [facility] clinical trial _______________________________________________________________________________________________  Surveillance: Breast MRI August 2020 with sedation  Current treatment: Anastrozole 1 mg daily started 06/02/2018 discontinued  07/02/2018 due to diffuse muscle aches and pains I discussed with the patient different options including letrozole or exemestane. We will try letrozole and see if she tolerates this any better.  Arthralgias/myalgias: Due to anastrozole therapy. Bone density 07/01/2018: Normal T score 0.1 Patient was offered information on ABC and B well clinical trials  Return to clinic in 2 months for toxicity evaluation when she sees [place]. She will need a breast MRI under sedation to be ordered in August 2020.  I will see her after the breast MRI.   Orders Placed This Encounter  Procedures  . Ambulatory referral to Physical Therapy    Referral Priority:   Routine    Referral Type:   Physical Medicine    Referral Reason:   Specialty Services Required    Requested Specialty:   Physical Therapy    Number of Visits Requested:   1   The patient has a good understanding of the overall plan. she agrees with it. she will call with any problems that may develop before the next visit here.   Harriette Ohara, MD 07/14/18

## 2018-07-14 NOTE — Assessment & Plan Note (Signed)
08/19/2017:Left lumpectomy: IDC grade 2, 2.5 cm, DCIS intermediate grade, lymphovascular invasion identified, margins negative, 1/2 lymph nodes positive with extracapsular extension, ER 90%, PR 40%, HER-2 negative ratio 1.15, Ki-67 15% T2 N1a stage II a; Mammaprint high risk   Recommendation: 1. Adjuvant chemotherapy with dose dense Adriamycin and Cytoxan x4 followed by Taxol weekly x12 completed 01/31/2018 2. followed by adjuvant radiation therapywhich will be completed 04/09/2018 3.  Bilateral salpingo-oophorectomy 05/15/2018 4. Followed by adjuvant antiestrogen therapy;will enroll the patient in [facility] clinical trial _______________________________________________________________________________________________  Surveillance: Breast MRI August 2020 with sedation  Current treatment: Anastrozole 1 mg daily started 06/02/2018 discontinued 07/02/2018 due to diffuse muscle aches and pains I discussed with the patient different options including letrozole or exemestane. We will try letrozole and see if she tolerates this any better.  Arthralgias/myalgias: Due to anastrozole therapy. Bone density 07/01/2018: Normal T score 0.1  Return to clinic in 3 months for toxicity evaluation.

## 2018-07-14 NOTE — Research (Addendum)
DCP-001  This RN and research RN, Tyrell Antonio, met with unaccompanied patient today for approximately 40 minutes. This RN  reviewed DCP-001 page by page with patient. Explained to patient the purpose of the trial, her choices, what type of information is collected, possible risks and benefits, her rights, and she where she can get more information. Pt confirmed that she understood and had no questions about the study. She signed the informed consent and hipaa at 48. She then completed the worksheet. Pt was provided with copies of the signed forms for her records. Thanked patient for her time and participation. Johney Maine RN, BSN Clinical Research  07/14/18 9:54 AM

## 2018-07-15 ENCOUNTER — Telehealth: Payer: Self-pay | Admitting: Hematology and Oncology

## 2018-07-15 NOTE — Research (Signed)
[facility] [id number] [facility]) [phone]  This RN, along with research RN Tyrell Antonio, met with unaccompanied patient for approximately 40 minutes. This RN had previously provided patient with informed consent and hipaa form for her to review. Research RN, Tyrell Antonio, briefly explained purpose of trial. Dr. Lindi Adie answered pt's questions about ABC trial. This RN confirmed for patient that she could enroll on this study and other studies concurrently depending on the specific study. Reminded patient that she remains eligible for 10 years after diagnosis so she has more time to think it over. Pt had no further questions. Thanked patient for her time and interest. Johney Maine RN, BSN Clinical Research  07/15/18 11:44 AM

## 2018-07-15 NOTE — Research (Signed)
[facility] [id number] [name]) [phone]  This RN along with research RN, [name], met with unaccompanied patient for approximately 40 minutes. This RN had previously mailed informed consent and hipaa form to patient for her to review. Research RN, [name], briefly explained study to patient. Dr. Lindi Adie also talked with patient about study. Please see his encounter note from 07/14/2018. This RN informed patient that she could enroll in [place] until 09/22/18. Pt confirmed that this RN could follow up with her at her appt on 08/28/18. Explained to patient that being enrolled on [facility]EL does not preclude her from participating in future studies. Patient had no further questions. Provided patient with consent form and hipaa form for BWEL. Thanked patient for her time and interest. Johney Maine RN, BSN Clinical Research  07/15/18 11:47 AM

## 2018-07-15 NOTE — Telephone Encounter (Signed)
No los °

## 2018-07-21 ENCOUNTER — Ambulatory Visit: Payer: BLUE CROSS/BLUE SHIELD | Admitting: Physical Therapy

## 2018-08-04 ENCOUNTER — Ambulatory Visit: Payer: BLUE CROSS/BLUE SHIELD | Admitting: Physical Therapy

## 2018-08-05 ENCOUNTER — Telehealth: Payer: Self-pay | Admitting: *Deleted

## 2018-08-05 NOTE — Telephone Encounter (Signed)
Pt called with c/o lump at lumpectomy scar that is new. Pt with benign mammo in February. Msg sent to Dr. Excell Seltzer nurse to have pt seen. Discussed with Dr. Lindi Adie

## 2018-08-08 ENCOUNTER — Other Ambulatory Visit: Payer: Self-pay | Admitting: General Surgery

## 2018-08-08 DIAGNOSIS — N6459 Other signs and symptoms in breast: Secondary | ICD-10-CM

## 2018-08-08 DIAGNOSIS — C50912 Malignant neoplasm of unspecified site of left female breast: Secondary | ICD-10-CM | POA: Diagnosis not present

## 2018-08-11 ENCOUNTER — Ambulatory Visit
Admission: RE | Admit: 2018-08-11 | Discharge: 2018-08-11 | Disposition: A | Discharge status: Home / Self Care | Payer: BLUE CROSS/BLUE SHIELD | Source: Ambulatory Visit | Attending: General Surgery | Admitting: General Surgery

## 2018-08-11 ENCOUNTER — Other Ambulatory Visit: Payer: BLUE CROSS/BLUE SHIELD

## 2018-08-11 ENCOUNTER — Other Ambulatory Visit: Payer: Self-pay | Admitting: General Surgery

## 2018-08-11 ENCOUNTER — Ambulatory Visit: Payer: BLUE CROSS/BLUE SHIELD | Attending: Hematology and Oncology | Admitting: Physical Therapy

## 2018-08-11 ENCOUNTER — Other Ambulatory Visit: Payer: Self-pay

## 2018-08-11 ENCOUNTER — Encounter: Payer: Self-pay | Admitting: Physical Therapy

## 2018-08-11 DIAGNOSIS — R293 Abnormal posture: Secondary | ICD-10-CM

## 2018-08-11 DIAGNOSIS — M25612 Stiffness of left shoulder, not elsewhere classified: Secondary | ICD-10-CM | POA: Diagnosis not present

## 2018-08-11 DIAGNOSIS — N6459 Other signs and symptoms in breast: Secondary | ICD-10-CM

## 2018-08-11 DIAGNOSIS — M25512 Pain in left shoulder: Secondary | ICD-10-CM

## 2018-08-11 DIAGNOSIS — R922 Inconclusive mammogram: Secondary | ICD-10-CM | POA: Diagnosis not present

## 2018-08-11 DIAGNOSIS — R208 Other disturbances of skin sensation: Secondary | ICD-10-CM | POA: Diagnosis not present

## 2018-08-11 DIAGNOSIS — N6320 Unspecified lump in the left breast, unspecified quadrant: Secondary | ICD-10-CM | POA: Diagnosis not present

## 2018-08-11 NOTE — Patient Instructions (Addendum)
Sitting in chair, reach with your left hand to the outside of your right foot. Gently pull upward feeling a stretch in your left upper back between your shoulderblade and spine. Hold 10 seconds, repeat 3 times, 2-3 times per day.Closed Chain: Shoulder Flexion / Extension - on Wall       Hands on wall, step backward. Return. Stepping causes shoulder flexion and extension Do _3__ times, holding 10 seconds, _3__ times per day.  http://ss.exer.us/265   Copyright  VHI. All rights reserved.  Shoulder Internal Rotation    Standing, feet shoulder width apart, grasp club with one hand palm forward, arm extended above head, and other hand palm back behind back, arm bent elbow down. Pull gently upward.  Hold __5__ seconds.  Repeat _5___ times. Do __3__ sessions per day.  Copyright  VHI. All rights reserved.  External Rotation    Lay on a foam roll with hands clasped behind head. Pull elbows back as far as possible. Hold _10___ seconds. Repeat __3__ times. Do __3__ sessions per day.  Copyright  VHI. All rights reserved.

## 2018-08-11 NOTE — Therapy (Signed)
[place], [place], [address] Phone: [phone]   Fax:  [phone]  Physical Therapy Evaluation  Patient Details  Name: [name] MRN: [id number] Date of Birth: [birth year] Referring Provider (PT): Dr. Nicholas Lose   Encounter Date: 08/11/2018  PT End of Session - 08/11/18 1208    Visit Number  1    Number of Visits  8    Date for PT Re-Evaluation  09/08/18    PT Start Time  1114    PT Stop Time  1205    PT Time Calculation (min)  51 min    Activity Tolerance  Patient tolerated treatment well    Behavior During Therapy  [facility] for tasks assessed/performed       Past Medical History:  Diagnosis Date  . Anxiety   . Breast cancer, left ([place]) 07/2017  . Family history of breast cancer   . Family history of colon cancer     Past Surgical History:  Procedure Laterality Date  . BREAST LUMPECTOMY WITH RADIOACTIVE SEED AND SENTINEL LYMPH NODE BIOPSY Left 08/16/2017   Procedure: BREAST LUMPECTOMY WITH RADIOACTIVE SEED AND SENTINEL LYMPH NODE BIOPSY;  Surgeon: Excell Seltzer, MD;  Location: [place];  Service: General;  Laterality: Left;  . BREAST LUMPECTOMY WITH SENTINEL LYMPH NODE BIOPSY Left 08/16/2017  . BREAST REDUCTION SURGERY Bilateral 08/23/2017   Procedure: LEFT ONCOPLASTIC REDUCTION, RIGHT BREAST REDUCTION;  Surgeon: Irene Limbo, MD;  Location: [place];  Service: Plastics;  Laterality: Bilateral;  . CESAREAN SECTION  03/15/2012   Procedure: CESAREAN SECTION;  Surgeon: Lovenia Kim, MD;  Location: [facility];  Service: Obstetrics;  Laterality: N/A;  Primary cesarean section with delivery of baby girl at 104. Apgars 4/7.  Marland Kitchen CESAREAN SECTION WITH BILATERAL TUBAL LIGATION Bilateral 09/03/2015   Procedure: REPEAT CESAREAN SECTION WITH BILATERAL TUBAL LIGATION;  Surgeon: Brien Few, MD;  Location: [facility];  Service: Obstetrics;  Laterality: Bilateral;   EDD: 09/10/15  . CHOLECYSTECTOMY  12/07/2011   Procedure: LAPAROSCOPIC CHOLECYSTECTOMY;  Surgeon: Harl Bowie, MD;  Location: [facility];  Service: General;  Laterality: N/A;  . PORTACATH PLACEMENT Right 09/11/2017   Procedure: INSERTION PORT-A-CATH;  Surgeon: Excell Seltzer, MD;  Location: [facility];  Service: General;  Laterality: Right;  . ROBOTIC ASSISTED SALPINGO OOPHERECTOMY Bilateral 05/13/2018   Procedure: XI ROBOTIC ASSISTED SALPINGO OOPHORECTOMY;  Surgeon: Brien Few, MD;  Location: [facility];  Service: Gynecology;  Laterality: Bilateral;  Requests 90 min.  . WISDOM TOOTH EXTRACTION  2002    There were no vitals filed for this visit.   Subjective Assessment - 08/11/18 1117    Subjective  She reports left lateral breast tightness since stopping radiation toward the end of November 2019. She reports limited shoulder ROM for the past 2 months. She reports she has an appointment today for an ultrasound because she feels a lumpy area at her incision site.    How long can you sit comfortably?  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 She had chemo 09/12/17-01/31/18 folowed by radiation.     Patient Stated Goals  To decrease left shoulder pain and tightness    Currently in Pain?  Yes    Pain Score  3     Pain Location  Shoulder    Pain Orientation  Left    Pain Descriptors / Indicators  Tightness    Pain Type  Chronic pain    Pain Onset  More than a month ago    Pain Frequency  Intermittent    Aggravating Factors   Laying on left side; reaching overhead; washing hair    Pain Relieving Factors  Resting    Multiple Pain Sites  No         [facility] PT Assessment - 08/11/18 0001      Assessment   Medical Diagnosis  Left shoulder pain s/p breast cancer    Referring Provider (PT)  Dr. Nicholas Lose    Onset Date/Surgical Date  04/26/18    Hand Dominance  Right    Prior Therapy  yes      Precautions    Precautions  Other (comment)    Precaution Comments  recent cancer treatment; left arm lymphedema      Restrictions   Weight Bearing Restrictions  No      Balance Screen   Has the patient fallen in the past 6 months  No    Has the patient had a decrease in activity level because of a fear of falling?   No    Is the patient reluctant to leave their home because of a fear of falling?   No      Home Social worker  Private residence    Living Arrangements  Spouse/significant other;Children   Husband, 2 and 53 y.o. kids   Available Help at Discharge  Family      Prior Function   Level of Independence  Independent    Vocation  Full time employment    Set designer    Leisure  Running or walking 6 days per week      Cognition   Overall Cognitive Status  Within Functional Limits for tasks assessed      Observation/Other Assessments   Observations  Patient requested PT feel area of concern superior to her incision; it feels like possible scar tissue but she will have ultrasound to day to r/o other pathology. Tender to palpation left lateral breast with some tightness present.      Posture/Postural Control   Posture/Postural Control  Postural limitations    Postural Limitations  Rounded Shoulders;Forward head      ROM / Strength   AROM / PROM / Strength  AROM;Strength      AROM   AROM Assessment Site  Shoulder;Cervical    Right/Left Shoulder  Right;Left    Right Shoulder Extension  58 Degrees    Right Shoulder Flexion  154 Degrees    Right Shoulder ABduction  166 Degrees    Right Shoulder Internal Rotation  57 Degrees    Right Shoulder External Rotation  81 Degrees    Left Shoulder Extension  59 Degrees    Left Shoulder Flexion  136 Degrees    Left Shoulder ABduction  148 Degrees    Left Shoulder Internal Rotation  53 Degrees    Left Shoulder External Rotation  68 Degrees    Cervical Flexion  WNL    Cervical  Extension  WNL    Cervical - Right Side Bend  WNL    Cervical - Left Side Bend  WNL    Cervical - Right Rotation  WNL    Cervical - Left Rotation  WNL      Strength   Overall Strength Comments  Left shoulder ER 4-/5 and painful; others 5/5      Palpation   Palpation comment  Palpable tightness and tenderness intrascapular area on left side        LYMPHEDEMA/ONCOLOGY QUESTIONNAIRE - 08/11/18 1126      Type   Cancer Type  Left breast cancer      Surgeries   Lumpectomy Date  08/16/17    Sentinel Lymph Node Biopsy Date  08/16/17    Number Lymph Nodes Removed  2      Treatment   Active Chemotherapy Treatment  No    Past Chemotherapy Treatment  Yes    Date  01/31/18    Active Radiation Treatment  No    Past Radiation Treatment  Yes    Date  04/26/18    Body Site  left breast and axilla    Current Hormone Treatment  Yes    Date  07/15/18    Drug Name  Letrozole    Past Hormone Therapy  Yes    Date  05/28/18    Drug Name  Anastrozole      What other symptoms do you have   Are you Having Heaviness or Tightness  Yes    Are you having Pain  Yes    Are you having pitting edema  No    Is it Hard or Difficult finding clothes that fit  No    Do you have infections  No    Is there Decreased scar mobility  No    Stemmer Sign  No          [name] - 08/11/18 0001    Open a tight or new jar  No difficulty    Do heavy household chores (wash walls, wash floors)  Mild difficulty    Carry a shopping bag or briefcase  No difficulty    Wash your back  Moderate difficulty    Use a knife to cut food  No difficulty    Recreational activities in which you take some force or impact through your arm, shoulder, or hand (golf, hammering, tennis)  Moderate difficulty    During the past week, to what extent has your arm, shoulder or hand problem interfered with your normal social activities with family, friends, neighbors, or groups?  Slightly    During the past week, to what extent has  your arm, shoulder or hand problem limited your work or other regular daily activities  Not at all    Arm, shoulder, or hand pain.  Mild    Tingling (pins and needles) in your arm, shoulder, or hand  Mild    Difficulty Sleeping  Moderate difficulty    DASH Score  22.73 %        Objective measurements completed on examination: See above findings.              PT Education - 08/11/18 1207    Education Details  Shoulder ROM HEP    Person(s) Educated  Patient    Methods  Explanation;Demonstration;Handout    Comprehension  Returned demonstration;Verbalized understanding          PT Long Term Goals - 08/11/18 1239      PT LONG TERM GOAL #1   Title  Patient will be independent with her shoulder ROM HEP    Time  4    Period  Weeks    Status  New      PT LONG TERM GOAL #2   Title  Patient will demonstrate active ROM flexion and abduction has returned to 160 degrees for increased ease reaching overhead.  Time  4    Period  Weeks    Status  New      PT LONG TERM GOAL #3   Title  Patient will report >/= 50% improvement in shoulder pain and tightness to better tolerate daily tasks.    Time  4    Period  Weeks    Status  New      PT LONG TERM GOAL #4   Title  Patient will report she has returned to all normal daily tasks without difficulty.    Time  4    Period  Weeks    Status  New      PT LONG TERM GOAL #5   Title  Patient will decrease her DASH score to </= 5 for improved overall shoulder function.    Time  4    Period  Weeks    Status  New             Plan - 08/11/18 1146    Clinical Impression Statement  Patient has concerns about her left shoulder pain and stiffness since completing left breast and axillary radiation. She has end ROM tightness in her shoulder in all directions and some palpable tenderness in her left lateral breast. She will benefit from PT to address those post radiation changes. She also has a breast ultrasound today to r/o new  pathology as she feels a lumpy area in her left superior breast.    Stability/Clinical Decision Making  Stable/Uncomplicated    Clinical Decision Making  Low    Rehab Potential  Excellent    PT Frequency  2x / week    PT Duration  4 weeks    PT Treatment/Interventions  ADLs/Self Care Home Management;Therapeutic exercise;Patient/family education;Passive range of motion;Manual lymph drainage;Manual techniques;Therapeutic activities    PT Next Visit Plan  PROM left shoulder; myofascial release left lateral breast; ER strengthening when ROM is back to normal; shoulder ROM exercises    PT Home Exercise Plan  Shoulder ROM HEP    Consulted and Agree with Plan of Care  Patient       Patient will benefit from skilled therapeutic intervention in order to improve the following deficits and impairments:  Decreased range of motion, Impaired UE functional use, Pain, Increased fascial restricitons, Postural dysfunction  Visit Diagnosis: Stiffness of left shoulder, not elsewhere classified - Plan: PT plan of care cert/re-cert  Acute pain of left shoulder - Plan: PT plan of care cert/re-cert  Abnormal posture - Plan: PT plan of care cert/re-cert  Other disturbances of skin sensation - Plan: PT plan of care cert/re-cert     Problem List Patient Active Problem List   Diagnosis Date Noted  . Port-A-Cath in place 09/12/2017  . Genetic testing 08/09/2017  . Family history of breast cancer   . Family history of colon cancer   . Family history of cancer   . Malignant neoplasm of upper-inner quadrant of left breast in female, estrogen receptor positive ([place]) 07/30/2017  . Previous cesarean section 09/05/2015  . Postpartum care following cesarean delivery with btl (4/8) 09/04/2015  . Symptomatic cholelithiasis 11/20/2011   Annia Friendly, PT 08/11/18 12:45 PM  [place], [place], [address] Phone: [phone]   Fax:   [phone]  Name: [name] MRN: [id number] Date of Birth: 1981/04/02

## 2018-08-14 ENCOUNTER — Ambulatory Visit: Payer: BLUE CROSS/BLUE SHIELD

## 2018-08-26 ENCOUNTER — Telehealth: Payer: Self-pay

## 2018-08-26 NOTE — Telephone Encounter (Signed)
Spoke with [name] to touch base in regards to her HEP and current functional status since clinic closing due to COVID-19 virus. She reports after only having evaluation (missed f/u appointment, but did not specify why as it was before we closed) she is still experiencing tightness in Lt shoulder though it has improved some with doing HEP issued at evaluation. Verbally instructed her to incorporate more stretching into her day and to focus on end ROM stretching (like in doorways or using mirrors in bathroom to assess her technique looking for compensations). If not, was told we would reach out to her once we know a reopen date. Pt verbalized understanding all and knows she can call us if she has any further questions.

## 2018-08-28 ENCOUNTER — Telehealth: Payer: BLUE CROSS/BLUE SHIELD | Admitting: Adult Health

## 2018-09-01 ENCOUNTER — Ambulatory Visit: Payer: BLUE CROSS/BLUE SHIELD | Admitting: Rehabilitation

## 2018-09-09 ENCOUNTER — Telehealth: Payer: Self-pay | Admitting: Adult Health

## 2018-09-09 NOTE — Telephone Encounter (Signed)
Left voicemail for patient regarding her [employer] appointment. I told her told download the [employer] App onto one of her devices. And sent her join link to [email or web address]. Told her to click the green "join meeting" button and that would populate the appointment details. Would also prompt her to download the app if she hadn't already.   Told her if she was unable to do a video call with Thedore Mins, to call us back and we would convert it to a phone call visit. Also told her to call back if she didn't receive her survivorship packet.

## 2018-09-10 ENCOUNTER — Encounter: Payer: Self-pay | Admitting: Adult Health

## 2018-09-10 ENCOUNTER — Inpatient Hospital Stay: Payer: BLUE CROSS/BLUE SHIELD | Attending: Hematology and Oncology | Admitting: Adult Health

## 2018-09-10 DIAGNOSIS — Z87891 Personal history of nicotine dependence: Secondary | ICD-10-CM | POA: Diagnosis not present

## 2018-09-10 DIAGNOSIS — Z170 Estrogen receptor positive status [ER+]: Secondary | ICD-10-CM

## 2018-09-10 DIAGNOSIS — Z803 Family history of malignant neoplasm of breast: Secondary | ICD-10-CM

## 2018-09-10 DIAGNOSIS — C50212 Malignant neoplasm of upper-inner quadrant of left female breast: Secondary | ICD-10-CM

## 2018-09-10 DIAGNOSIS — Z9221 Personal history of antineoplastic chemotherapy: Secondary | ICD-10-CM

## 2018-09-10 DIAGNOSIS — Z79811 Long term (current) use of aromatase inhibitors: Secondary | ICD-10-CM

## 2018-09-10 DIAGNOSIS — Z800 Family history of malignant neoplasm of digestive organs: Secondary | ICD-10-CM

## 2018-09-10 DIAGNOSIS — Z79899 Other long term (current) drug therapy: Secondary | ICD-10-CM

## 2018-09-10 DIAGNOSIS — Z1231 Encounter for screening mammogram for malignant neoplasm of breast: Secondary | ICD-10-CM

## 2018-09-10 DIAGNOSIS — Z923 Personal history of irradiation: Secondary | ICD-10-CM
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
## 2018-09-11 ENCOUNTER — Telehealth: Payer: BLUE CROSS/BLUE SHIELD | Admitting: Adult Health

## 2018-09-11 ENCOUNTER — Telehealth: Payer: Self-pay | Admitting: Hematology and Oncology

## 2018-09-11 DIAGNOSIS — Z124 Encounter for screening for malignant neoplasm of cervix: Secondary | ICD-10-CM | POA: Diagnosis not present

## 2018-09-11 DIAGNOSIS — Z01419 Encounter for gynecological examination (general) (routine) without abnormal findings: Secondary | ICD-10-CM | POA: Diagnosis not present

## 2018-09-11 DIAGNOSIS — Z1151 Encounter for screening for human papillomavirus (HPV): Secondary | ICD-10-CM | POA: Diagnosis not present

## 2018-09-11 DIAGNOSIS — Z6836 Body mass index (BMI) 36.0-36.9, adult: Secondary | ICD-10-CM | POA: Diagnosis not present

## 2018-09-11 NOTE — Telephone Encounter (Signed)
Called regarding 9/8 °

## 2018-09-26 DIAGNOSIS — D225 Melanocytic nevi of trunk: Secondary | ICD-10-CM | POA: Diagnosis not present

## 2018-09-26 DIAGNOSIS — L814 Other melanin hyperpigmentation: Secondary | ICD-10-CM | POA: Diagnosis not present

## 2018-09-26 DIAGNOSIS — D229 Melanocytic nevi, unspecified: Secondary | ICD-10-CM | POA: Diagnosis not present

## 2018-09-26 DIAGNOSIS — D485 Neoplasm of uncertain behavior of skin: Secondary | ICD-10-CM | POA: Diagnosis not present

## 2018-09-26 DIAGNOSIS — D1801 Hemangioma of skin and subcutaneous tissue: Secondary | ICD-10-CM | POA: Diagnosis not present

## 2018-09-26 DIAGNOSIS — L905 Scar conditions and fibrosis of skin: Secondary | ICD-10-CM | POA: Diagnosis not present

## 2018-09-30 ENCOUNTER — Telehealth: Payer: Self-pay | Admitting: Hematology & Oncology

## 2018-09-30 NOTE — Telephone Encounter (Signed)
Left pt a vm re: appt for 10/01/18 to call back so we can schedule to Webex mtg.

## 2018-10-01 DIAGNOSIS — C50912 Malignant neoplasm of unspecified site of left female breast: Secondary | ICD-10-CM | POA: Diagnosis not present

## 2018-10-02 ENCOUNTER — Other Ambulatory Visit: Payer: Self-pay

## 2018-10-02 ENCOUNTER — Ambulatory Visit: Payer: BLUE CROSS/BLUE SHIELD | Attending: Hematology and Oncology | Admitting: Rehabilitation

## 2018-10-02 ENCOUNTER — Encounter: Payer: Self-pay | Admitting: Rehabilitation

## 2018-10-02 DIAGNOSIS — R293 Abnormal posture: Secondary | ICD-10-CM | POA: Insufficient documentation

## 2018-10-02 DIAGNOSIS — R600 Localized edema: Secondary | ICD-10-CM | POA: Insufficient documentation

## 2018-10-02 DIAGNOSIS — M25612 Stiffness of left shoulder, not elsewhere classified: Secondary | ICD-10-CM | POA: Insufficient documentation

## 2018-10-02 DIAGNOSIS — R208 Other disturbances of skin sensation: Secondary | ICD-10-CM | POA: Diagnosis not present

## 2018-10-02 DIAGNOSIS — M25512 Pain in left shoulder: Secondary | ICD-10-CM | POA: Diagnosis not present

## 2018-10-02 DIAGNOSIS — Z170 Estrogen receptor positive status [ER+]: Secondary | ICD-10-CM | POA: Insufficient documentation

## 2018-10-02 DIAGNOSIS — C50212 Malignant neoplasm of upper-inner quadrant of left female breast: Secondary | ICD-10-CM | POA: Diagnosis not present

## 2018-10-02 DIAGNOSIS — Z483 Aftercare following surgery for neoplasm: Secondary | ICD-10-CM | POA: Diagnosis not present

## 2018-10-02 NOTE — Therapy (Signed)
[place], [place], [address] Phone: [phone]   Fax:  [phone]  Physical Therapy Treatment  Patient Details  Name: [name] MRN: [id number] Date of Birth: 01-26-1981 Referring Provider (PT): Dr. Nicholas Lose   Encounter Date: 10/02/2018  PT End of Session - 10/02/18 1101    Visit Number  2    Number of Visits  8    Date for PT Re-Evaluation  11/13/18    Authorization Type  [employer]    PT Start Time  1102    PT Stop Time  1145    PT Time Calculation (min)  43 min    Activity Tolerance  Patient tolerated treatment well    Behavior During Therapy  [facility] for tasks assessed/performed       Past Medical History:  Diagnosis Date  . Anxiety   . Breast cancer, left ([place]) 07/2017  . Family history of breast cancer   . Family history of colon cancer     Past Surgical History:  Procedure Laterality Date  . BREAST LUMPECTOMY WITH RADIOACTIVE SEED AND SENTINEL LYMPH NODE BIOPSY Left 08/16/2017   Procedure: BREAST LUMPECTOMY WITH RADIOACTIVE SEED AND SENTINEL LYMPH NODE BIOPSY;  Surgeon: Excell Seltzer, MD;  Location: [place];  Service: General;  Laterality: Left;  . BREAST LUMPECTOMY WITH SENTINEL LYMPH NODE BIOPSY Left 08/16/2017  . BREAST REDUCTION SURGERY Bilateral 08/23/2017   Procedure: LEFT ONCOPLASTIC REDUCTION, RIGHT BREAST REDUCTION;  Surgeon: Irene Limbo, MD;  Location: [place];  Service: Plastics;  Laterality: Bilateral;  . CESAREAN SECTION  03/15/2012   Procedure: CESAREAN SECTION;  Surgeon: Lovenia Kim, MD;  Location: [facility];  Service: Obstetrics;  Laterality: N/A;  Primary cesarean section with delivery of baby girl at 71. Apgars 4/7.  Marland Kitchen CESAREAN SECTION WITH BILATERAL TUBAL LIGATION Bilateral 09/03/2015   Procedure: REPEAT CESAREAN SECTION WITH BILATERAL TUBAL LIGATION;  Surgeon: Brien Few, MD;  Location: [facility];  Service: Obstetrics;   Laterality: Bilateral;  EDD: 09/10/15  . CHOLECYSTECTOMY  12/07/2011   Procedure: LAPAROSCOPIC CHOLECYSTECTOMY;  Surgeon: Harl Bowie, MD;  Location: [facility];  Service: General;  Laterality: N/A;  . PORTACATH PLACEMENT Right 09/11/2017   Procedure: INSERTION PORT-A-CATH;  Surgeon: Excell Seltzer, MD;  [facility];  Service: General;  Laterality: Right;  . ROBOTIC ASSISTED SALPINGO OOPHERECTOMY Bilateral 05/13/2018   Procedure: XI ROBOTIC ASSISTED SALPINGO OOPHORECTOMY;  Surgeon: Brien Few, MD;  Location: [facility];  Service: Gynecology;  Laterality: Bilateral;  Requests 90 min.  . WISDOM TOOTH EXTRACTION  2002    There were no vitals filed for this visit.  Subjective Assessment - 10/02/18 1105    Subjective  It is still just really tight.  I just can't move it like I used to. I am also having tingling in the Lt 2 fingers x 10 days now maybe worse with running    How long can you sit comfortably?  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 She had chemo 09/12/17-01/31/18 folowed by radiation.     Patient Stated Goals  To decrease left shoulder pain and tightness    Currently in Pain?  No/denies         [facility] PT Assessment - 10/02/18 0001      AROM   Left Shoulder Flexion  150 Degrees    Left Shoulder ABduction  154 Degrees    Left  Shoulder Internal Rotation  60 Degrees    Left Shoulder External Rotation  75 Degrees      Special Tests    Special Tests  Cervical    Cervical Tests  other;other2      other    Findings  Positive    Side  Left    Comment  increased tingling with Lt lateral flexion      other    Findings  Positive    Side  Left    Comment  decrease tingling with chin tuck x 3                   [facility] Adult PT Treatment/Exercise - 10/02/18 0001      Exercises   Exercises  Other Exercises    Other Exercises   given doorway stretch, ulnar nerve glide, and  chin tuck per instructions with performance and instruction on each. Discussion on posture during work and tingling      Manual Therapy   Manual Therapy  Passive ROM;Soft tissue mobilization    Soft tissue mobilization  to the Lt pectoralis and superior shoulder/UT    Passive ROM  to the Lt shoulder with prolonged holds and STM incorporated ot the pectoralis and deltoid region             PT Education - 10/02/18 1157    Education Details  new HEP, posture    Person(s) Educated  Patient    Methods  Explanation;Demonstration;Tactile cues;Verbal cues;Handout    Comprehension  Verbalized understanding;Returned demonstration;Verbal cues required;Tactile cues required          PT Long Term Goals - 10/02/18 1724      PT LONG TERM GOAL #1   Title  Patient will be independent with her shoulder ROM HEP    Status  On-going      PT LONG TERM GOAL #2   Title  Patient will demonstrate active ROM flexion and abduction has returned to 160 degrees for increased ease reaching overhead.    Status  On-going      PT LONG TERM GOAL #3   Title  Patient will report >/= 50% improvement in shoulder pain and tightness to better tolerate daily tasks.    Status  On-going      PT LONG TERM GOAL #4   Title  Pt will eliminate tingling in the Lt UE     Status  New      PT LONG TERM GOAL #5   Title  Patient will decrease her DASH score to </= 5 for improved overall shoulder function.    Status  On-going            Plan - 10/02/18 1158    Clinical Impression Statement  Pt returns today after clinic COVID-19 break wiht continued feelings of stiffness in the Lt shoulder and with new symptom of tingling in the digits 4-5.  AROM has improved since eval visit but still with end range stiffness.  Tingling in the ulnar nerve distribution with +ULTT and cervical implications with AROM most likely postural related.  Added some postural exercises today.  Pt feeling much relief after PT sessions and will  schedule more sessions at this time.     Stability/Clinical Decision Making  Stable/Uncomplicated    Rehab Potential  Excellent    PT Frequency  1x / week    PT Duration  6 weeks    PT Treatment/Interventions  ADLs/Self Care Home Management;Therapeutic exercise;Patient/family education;Passive range  of motion;Manual lymph drainage;Manual techniques;Therapeutic activities    PT Next Visit Plan  Lt shoulder mobility and ROM, STM pectoralis and scapular muscles, postural strengthening to include chin tuck, ulnar nerve glides/watching status    PT Home Exercise Plan  post op and Access Code: [email or web address]     Consulted and Agree with Plan of Care  Patient       Patient will benefit from skilled therapeutic intervention in order to improve the following deficits and impairments:  Decreased range of motion, Impaired UE functional use, Pain, Increased fascial restricitons, Postural dysfunction  Visit Diagnosis: Stiffness of left shoulder, not elsewhere classified  Acute pain of left shoulder  Abnormal posture  Other disturbances of skin sensation  Malignant neoplasm of upper-inner quadrant of left breast in female, estrogen receptor positive [facility])  Aftercare following surgery for neoplasm     Problem List Patient Active Problem List   Diagnosis Date Noted  . Port-A-Cath in place 09/12/2017  . Genetic testing 08/09/2017  . Family history of breast cancer   . Family history of colon cancer   . Family history of cancer   . Malignant neoplasm of upper-inner quadrant of left breast in female, estrogen receptor positive ([place]) 07/30/2017  . Previous cesarean section 09/05/2015  . Postpartum care following cesarean delivery with btl (4/8) 09/04/2015  . Symptomatic cholelithiasis 11/20/2011    Shan Levans, PT 10/02/2018, 5:27 PM  [place], [place], [address] Phone: [phone]   Fax:  [phone]  Name:  [name] MRN: [id number] Date of Birth: December 09, 1980

## 2018-10-02 NOTE — Patient Instructions (Signed)
  Access Code: [email or web address]  URL: [email or web address]  Date: 10/02/2018  Prepared by: Shan Levans   Exercises  Doorway Pec Stretch at 60 Elevation - 10 reps - 1-3 sets - 20-30 seconds hold - 1x daily - 7x weekly  Seated Cervical Retraction - 10 reps - 4-5x daily - 7x weekly  Standing Ulnar Nerve Glide - 10 reps - 1x daily - 7x weekly

## 2018-10-07 ENCOUNTER — Telehealth: Payer: Self-pay

## 2018-10-07 DIAGNOSIS — Z170 Estrogen receptor positive status [ER+]: Secondary | ICD-10-CM

## 2018-10-07 DIAGNOSIS — C50212 Malignant neoplasm of upper-inner quadrant of left female breast: Secondary | ICD-10-CM

## 2018-10-07 NOTE — Progress Notes (Signed)
DCP-001 Consent  Called patient to discuss DCP-001 "Use of a Clinical Trial Screening Tool to Address Cancer Health Disparities in the [place] Program." Patient is aware that this involves one-time consent, and collection of demographic variables, with the majority of data collected from her medical record. Noted that no patient identifiers are being reported via the screening tool. The consent and authorization forms were reviewed with the patient in their entirety. All of her questions were answered and patient gave verbal consent to participate. Copies of the consent and authorization forms will be mailed to her. Patient meets eligibility and will be enrolled in the DCP-001 study. Asked patient questions required for the study. Patient was thanked for her time and participation. Johney Maine RN, BSN Clinical Research  10/07/18 1:23 PM

## 2018-10-07 NOTE — Telephone Encounter (Signed)
[facility] [phone] BWEL  Informed patient that due to COVID-19 pandemic that our site had stopped accrual for non-treatment trials for BWEL. Told patient that we felt it was in her best interest to avoid unnecessary healthcare visits. Pt verbalized understanding. Thanked patient for her interest and time. Johney Maine RN, BSN Clinical Research  10/07/18 1:08 PM

## 2018-10-07 NOTE — Progress Notes (Signed)
[facility] [phone] [facility]  Per previous note, this RN was supposed to follow up with patient at her visit on 08/28/18. Visit was changed to a telehealth visit. Called patient to check in with her. Informed patient that due to the COVID-19 pandemic we are only enrolling on trials where the benefit of the trial outweighs the risk of coming in for extra visits. Since she has several years of eligibility for the ABC trial asked patient if it was okay to reach out to her at her next in office visit in September. Patient was amenable. Thanked patient for her time and interest. Johney Maine RN, BSN Clinical Research  10/07/18 1:16 PM

## 2018-10-09 ENCOUNTER — Encounter: Payer: Self-pay | Admitting: Rehabilitation

## 2018-10-09 ENCOUNTER — Ambulatory Visit: Payer: BLUE CROSS/BLUE SHIELD | Admitting: Rehabilitation

## 2018-10-09 ENCOUNTER — Other Ambulatory Visit: Payer: Self-pay

## 2018-10-09 DIAGNOSIS — Z170 Estrogen receptor positive status [ER+]: Secondary | ICD-10-CM

## 2018-10-09 DIAGNOSIS — R208 Other disturbances of skin sensation: Secondary | ICD-10-CM | POA: Diagnosis not present

## 2018-10-09 DIAGNOSIS — R600 Localized edema: Secondary | ICD-10-CM | POA: Diagnosis not present

## 2018-10-09 DIAGNOSIS — Z483 Aftercare following surgery for neoplasm: Secondary | ICD-10-CM

## 2018-10-09 DIAGNOSIS — C50212 Malignant neoplasm of upper-inner quadrant of left female breast: Secondary | ICD-10-CM

## 2018-10-09 DIAGNOSIS — M25512 Pain in left shoulder: Secondary | ICD-10-CM | POA: Diagnosis not present

## 2018-10-09 DIAGNOSIS — M25612 Stiffness of left shoulder, not elsewhere classified: Secondary | ICD-10-CM | POA: Diagnosis not present

## 2018-10-09 DIAGNOSIS — R293 Abnormal posture: Secondary | ICD-10-CM | POA: Diagnosis not present

## 2018-10-09 NOTE — Therapy (Signed)
[place], [place], [address] Phone: [phone]   Fax:  [phone]  Physical Therapy Treatment  Patient Details  Name: [name] MRN: [id number] Date of Birth: 05/20/1981 Referring Provider (PT): Dr. Nicholas Lose   Encounter Date: 10/09/2018  PT End of Session - 10/09/18 1338    Visit Number  3    Number of Visits  8    Date for PT Re-Evaluation  11/13/18    PT Start Time  7793    PT Stop Time  1418    PT Time Calculation (min)  39 min    Activity Tolerance  Patient tolerated treatment well    Behavior During Therapy  [facility] for tasks assessed/performed       Past Medical History:  Diagnosis Date  . Anxiety   . Breast cancer, left ([place]) 07/2017  . Family history of breast cancer   . Family history of colon cancer     Past Surgical History:  Procedure Laterality Date  . BREAST LUMPECTOMY WITH RADIOACTIVE SEED AND SENTINEL LYMPH NODE BIOPSY Left 08/16/2017   Procedure: BREAST LUMPECTOMY WITH RADIOACTIVE SEED AND SENTINEL LYMPH NODE BIOPSY;  Surgeon: Excell Seltzer, MD;  Location: [place];  Service: General;  Laterality: Left;  . BREAST LUMPECTOMY WITH SENTINEL LYMPH NODE BIOPSY Left 08/16/2017  . BREAST REDUCTION SURGERY Bilateral 08/23/2017   Procedure: LEFT ONCOPLASTIC REDUCTION, RIGHT BREAST REDUCTION;  Surgeon: Irene Limbo, MD;  Location: [place];  Service: Plastics;  Laterality: Bilateral;  . CESAREAN SECTION  03/15/2012   Procedure: CESAREAN SECTION;  Surgeon: Lovenia Kim, MD;  Location: [facility];  Service: Obstetrics;  Laterality: N/A;  Primary cesarean section with delivery of baby girl at 45. Apgars 4/7.  Marland Kitchen CESAREAN SECTION WITH BILATERAL TUBAL LIGATION Bilateral 09/03/2015   Procedure: REPEAT CESAREAN SECTION WITH BILATERAL TUBAL LIGATION;  Surgeon: Brien Few, MD;  Location: [facility];  Service: Obstetrics;  Laterality: Bilateral;   EDD: 09/10/15  . CHOLECYSTECTOMY  12/07/2011   Procedure: LAPAROSCOPIC CHOLECYSTECTOMY;  Surgeon: Harl Bowie, MD;  Location: [facility];  Service: General;  Laterality: N/A;  . PORTACATH PLACEMENT Right 09/11/2017   Procedure: INSERTION PORT-A-CATH;  Surgeon: Excell Seltzer, MD;  Location[facility];  Service: General;  Laterality: Right;  . ROBOTIC ASSISTED SALPINGO OOPHERECTOMY Bilateral 05/13/2018   Procedure: XI ROBOTIC ASSISTED SALPINGO OOPHORECTOMY;  Surgeon: Brien Few, MD;  Location: [place];  Service: Gynecology;  Laterality: Bilateral;  Requests 90 min.  . WISDOM TOOTH EXTRACTION  2002    There were no vitals filed for this visit.  Subjective Assessment - 10/09/18 1338    Subjective  Shoulder doing well; still the weird tingling. It gets better right after doing them and then comes back    How long can you sit comfortably?  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 She had chemo 09/12/17-01/31/18 folowed by radiation.     Patient Stated Goals  To decrease left shoulder pain and tightness    Currently in Pain?  No/denies                       [facility] Adult PT Treatment/Exercise - 10/09/18 0001      Exercises   Exercises  Neck;Shoulder      Neck Exercises: Supine   Neck Retraction  10 reps;5 secs    Neck Retraction Limitations  with  vcs for completion    Shoulder ABduction  Both;10 reps    Shoulder Abduction Limitations  red band with chin tuck    Upper Extremity D2  10 reps;Theraband    UE D2 Limitations  red Lt only    Other Supine Exercise  red band bil ER with chin tuck x 10      Manual Therapy   Manual therapy comments  ulnar nerve flossing x 10 with elbow flexion and extension x 10 prior to P1    Soft tissue mobilization  to the Lt pectoralis and superior shoulder/UT    Passive ROM  to the Lt shoulder with prolonged holds and STM incorporated ot the pectoralis and  deltoid region             PT Education - 10/09/18 1422    Education Details  updated HEP          PT Long Term Goals - 10/02/18 1724      PT LONG TERM GOAL #1   Title  Patient will be independent with her shoulder ROM HEP    Status  On-going      PT LONG TERM GOAL #2   Title  Patient will demonstrate active ROM flexion and abduction has returned to 160 degrees for increased ease reaching overhead.    Status  On-going      PT LONG TERM GOAL #3   Title  Patient will report >/= 50% improvement in shoulder pain and tightness to better tolerate daily tasks.    Status  On-going      PT LONG TERM GOAL #4   Title  Pt will eliminate tingling in the Lt UE     Status  New      PT LONG TERM GOAL #5   Title  Patient will decrease her DASH score to </= 5 for improved overall shoulder function.    Status  On-going            Plan - 10/09/18 1412    Clinical Impression Statement  Pt tolerated treatment well.  Improving shoulder PROM and less feelings of tightness; added some more postural strengthening today and issued red band.      PT Next Visit Plan  Lt shoulder mobility and ROM, STM pectoralis and scapular muscles, postural strengthening to include chin tuck, ulnar nerve glides/watching status eventually teach strength ABC    PT Home Exercise Plan  post op and Access Code: [email or web address]        Patient will benefit from skilled therapeutic intervention in order to improve the following deficits and impairments:     Visit Diagnosis: Stiffness of left shoulder, not elsewhere classified  Acute pain of left shoulder  Abnormal posture  Other disturbances of skin sensation  Malignant neoplasm of upper-inner quadrant of left breast in female, estrogen receptor positive [facility])  Aftercare following surgery for neoplasm  Localized edema     Problem List Patient Active Problem List   Diagnosis Date Noted  . Port-A-Cath in place 09/12/2017  . Genetic testing 08/09/2017   . Family history of breast cancer   . Family history of colon cancer   . Family history of cancer   . Malignant neoplasm of upper-inner quadrant of left breast in female, estrogen receptor positive ([place]) 07/30/2017  . Previous cesarean section 09/05/2015  . Postpartum care following cesarean delivery with btl (4/8) 09/04/2015  . Symptomatic cholelithiasis 11/20/2011    Shan Levans, PT 10/09/2018, 2:24 PM  [facility]  [place], [place], [address] Phone: [phone]   Fax:  [phone]  Name: [name] MRN: [id number] Date of Birth: 1981/03/03

## 2018-10-14 ENCOUNTER — Ambulatory Visit: Payer: BLUE CROSS/BLUE SHIELD | Admitting: Rehabilitation

## 2018-10-16 ENCOUNTER — Other Ambulatory Visit: Payer: Self-pay

## 2018-10-16 ENCOUNTER — Ambulatory Visit: Payer: BLUE CROSS/BLUE SHIELD | Admitting: Rehabilitation

## 2018-10-16 ENCOUNTER — Encounter: Payer: Self-pay | Admitting: Rehabilitation

## 2018-10-16 DIAGNOSIS — Z483 Aftercare following surgery for neoplasm: Secondary | ICD-10-CM | POA: Diagnosis not present

## 2018-10-16 DIAGNOSIS — R208 Other disturbances of skin sensation: Secondary | ICD-10-CM | POA: Diagnosis not present

## 2018-10-16 DIAGNOSIS — M25612 Stiffness of left shoulder, not elsewhere classified: Secondary | ICD-10-CM | POA: Diagnosis not present

## 2018-10-16 DIAGNOSIS — M25512 Pain in left shoulder: Secondary | ICD-10-CM

## 2018-10-16 DIAGNOSIS — R293 Abnormal posture: Secondary | ICD-10-CM

## 2018-10-16 DIAGNOSIS — R600 Localized edema: Secondary | ICD-10-CM | POA: Diagnosis not present

## 2018-10-16 DIAGNOSIS — Z170 Estrogen receptor positive status [ER+]: Secondary | ICD-10-CM | POA: Diagnosis not present

## 2018-10-16 DIAGNOSIS — C50212 Malignant neoplasm of upper-inner quadrant of left female breast: Secondary | ICD-10-CM | POA: Diagnosis not present

## 2018-10-16 NOTE — Therapy (Signed)
[place], [place], [address] Phone: [phone]   Fax:  [phone]  Physical Therapy Treatment  Patient Details  Name: [name] MRN: [id number] Date of Birth: [birth year] Referring Provider (PT): Dr. Nicholas Lose   Encounter Date: 10/16/2018  PT End of Session - 10/16/18 1417    Visit Number  4    Number of Visits  8    Date for PT Re-Evaluation  11/13/18    Authorization Type  [employer]    PT Start Time  1332    PT Stop Time  1413    PT Time Calculation (min)  41 min    Activity Tolerance  Patient tolerated treatment well    Behavior During Therapy  [facility] for tasks assessed/performed       Past Medical History:  Diagnosis Date  . Anxiety   . Breast cancer, left ([place]) 07/2017  . Family history of breast cancer   . Family history of colon cancer     Past Surgical History:  Procedure Laterality Date  . BREAST LUMPECTOMY WITH RADIOACTIVE SEED AND SENTINEL LYMPH NODE BIOPSY Left 08/16/2017   Procedure: BREAST LUMPECTOMY WITH RADIOACTIVE SEED AND SENTINEL LYMPH NODE BIOPSY;  Surgeon: Excell Seltzer, MD;  Location: [place];  Service: General;  Laterality: Left;  . BREAST LUMPECTOMY WITH SENTINEL LYMPH NODE BIOPSY Left 08/16/2017  . BREAST REDUCTION SURGERY Bilateral 08/23/2017   Procedure: LEFT ONCOPLASTIC REDUCTION, RIGHT BREAST REDUCTION;  Surgeon: Irene Limbo, MD;  Location: [place];  Service: Plastics;  Laterality: Bilateral;  . CESAREAN SECTION  03/15/2012   Procedure: CESAREAN SECTION;  Surgeon: Lovenia Kim, MD;  Location: [facility];  Service: Obstetrics;  Laterality: N/A;  Primary cesarean section with delivery of baby girl at 41. Apgars 4/7.  Marland Kitchen CESAREAN SECTION WITH BILATERAL TUBAL LIGATION Bilateral 09/03/2015   Procedure: REPEAT CESAREAN SECTION WITH BILATERAL TUBAL LIGATION;  Surgeon: Brien Few, MD;  Location: [facility];  Service: Obstetrics;   Laterality: Bilateral;  EDD: 09/10/15  . CHOLECYSTECTOMY  12/07/2011   Procedure: LAPAROSCOPIC CHOLECYSTECTOMY;  Surgeon: Harl Bowie, MD;  Location: [facility];  Service: General;  Laterality: N/A;  . PORTACATH PLACEMENT Right 09/11/2017   Procedure: INSERTION PORT-A-CATH;  Surgeon: Excell Seltzer, MD;  [facility];  Service: General;  Laterality: Right;  . ROBOTIC ASSISTED SALPINGO OOPHERECTOMY Bilateral 05/13/2018   Procedure: XI ROBOTIC ASSISTED SALPINGO OOPHORECTOMY;  Surgeon: Brien Few, MD;  Location: [place];  Service: Gynecology;  Laterality: Bilateral;  Requests 90 min.  . WISDOM TOOTH EXTRACTION  2002    There were no vitals filed for this visit.  Subjective Assessment - 10/16/18 1336    Subjective  Everything is doing better.  it is tingling less and the shoulder feels looser.      How long can you sit comfortably?  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 She had chemo 09/12/17-01/31/18 folowed by radiation.     Patient Stated Goals  To decrease left shoulder pain and tightness    Currently in Pain?  No/denies                       [facility] Adult PT Treatment/Exercise - 10/16/18 0001      Neck Exercises: Standing   Other Standing Exercises  flexion and abduction 2# x 10 each      Neck Exercises: Supine  Neck Retraction  10 reps;5 secs    Neck Retraction Limitations  less cueing needed    Shoulder ABduction  Both;10 reps    Shoulder Abduction Limitations  red band with chin tuck      Neck Exercises: Prone   Other Prone Exercise  serratus 2# x 10 with tcs, extension 2# x 10, T 2# x 10       Shoulder Exercises: Supine   Protraction  Both;15 reps    Protraction Limitations  difficult to perform needing tcs    Flexion  Both;10 reps;Weights    Shoulder Flexion Weight (lbs)  2      Manual Therapy   Manual therapy comments  ulnar nerve flossing x 10 with elbow  flexion and extension x 10 prior to P1    Soft tissue mobilization  to the Lt pectoralis and superior shoulder/UT    Passive ROM  to the Lt shoulder with prolonged holds and STM incorporated ot the pectoralis and deltoid region                  PT Long Term Goals - 10/16/18 1419      PT LONG TERM GOAL #1   Title  Patient will be independent with her shoulder ROM HEP    Status  On-going      PT LONG TERM GOAL #3   Title  Patient will report >/= 50% improvement in shoulder pain and tightness to better tolerate daily tasks.    Status  On-going      PT LONG TERM GOAL #4   Title  Pt will eliminate tingling in the Lt UE     Status  Partially Met      PT LONG TERM GOAL #5   Title  Patient will decrease her DASH score to </= 5 for improved overall shoulder function.    Status  On-going            Plan - 10/16/18 1418    Clinical Impression Statement  Much improved shoulder PROM today without much STM needed.  Still with pulling end ROM flexion but pt also with hypermobility overall in the shoulder making ROM look normal but stlil tight to the patient.  Added more strengthening today tolerated well    PT Frequency  1x / week    PT Duration  6 weeks    PT Next Visit Plan  Lt shoulder mobility and ROM, postural strengthening to include chin tuck, ulnar nerve glides/watching status eventually teach strength ABC    PT Home Exercise Plan  post op and Access Code: [email or web address]     Consulted and Agree with Plan of Care  Patient       Patient will benefit from skilled therapeutic intervention in order to improve the following deficits and impairments:     Visit Diagnosis: Stiffness of left shoulder, not elsewhere classified  Acute pain of left shoulder  Abnormal posture  Other disturbances of skin sensation     Problem List Patient Active Problem List   Diagnosis Date Noted  . Port-A-Cath in place 09/12/2017  . Genetic testing 08/09/2017  . Family history of breast  cancer   . Family history of colon cancer   . Family history of cancer   . Malignant neoplasm of upper-inner quadrant of left breast in female, estrogen receptor positive ([place]) 07/30/2017  . Previous cesarean section 09/05/2015  . Postpartum care following cesarean delivery with btl (4/8) 09/04/2015  . Symptomatic cholelithiasis 11/20/2011  Shan Levans, PT 10/16/2018, 2:20 PM  [place], [place], [address] Phone: [phone]   Fax:  [phone]  Name: [name] MRN: [id number] Date of Birth: July 10, 1980

## 2018-10-24 ENCOUNTER — Encounter

## 2018-10-27 ENCOUNTER — Other Ambulatory Visit: Payer: Self-pay

## 2018-10-27 ENCOUNTER — Encounter: Payer: Self-pay | Admitting: Rehabilitation

## 2018-10-27 ENCOUNTER — Ambulatory Visit: Payer: BC Managed Care – PPO | Attending: Hematology and Oncology | Admitting: Rehabilitation

## 2018-10-27 DIAGNOSIS — C50212 Malignant neoplasm of upper-inner quadrant of left female breast: Secondary | ICD-10-CM

## 2018-10-27 DIAGNOSIS — M25612 Stiffness of left shoulder, not elsewhere classified: Secondary | ICD-10-CM

## 2018-10-27 DIAGNOSIS — R208 Other disturbances of skin sensation: Secondary | ICD-10-CM

## 2018-10-27 DIAGNOSIS — R293 Abnormal posture: Secondary | ICD-10-CM

## 2018-10-27 DIAGNOSIS — Z483 Aftercare following surgery for neoplasm: Secondary | ICD-10-CM | POA: Diagnosis not present

## 2018-10-27 DIAGNOSIS — R600 Localized edema: Secondary | ICD-10-CM | POA: Diagnosis not present

## 2018-10-27 DIAGNOSIS — M25512 Pain in left shoulder: Secondary | ICD-10-CM

## 2018-10-27 DIAGNOSIS — Z170 Estrogen receptor positive status [ER+]: Secondary | ICD-10-CM | POA: Diagnosis not present

## 2018-10-27 NOTE — Therapy (Signed)
[place], [place], [address] Phone: [phone]   Fax:  [phone]  Physical Therapy Treatment  Patient Details  Name: [name] MRN: [id number] Date of Birth: [birth year] Referring Provider (PT): Dr. Nicholas Lose   Encounter Date: 10/27/2018  PT End of Session - 10/27/18 1122    Visit Number  5    Number of Visits  8    Date for PT Re-Evaluation  11/13/18    PT Start Time  1032    PT Stop Time  1115    PT Time Calculation (min)  43 min    Activity Tolerance  Patient tolerated treatment well    Behavior During Therapy  [facility] for tasks assessed/performed       Past Medical History:  Diagnosis Date  . Anxiety   . Breast cancer, left ([place]) 07/2017  . Family history of breast cancer   . Family history of colon cancer     Past Surgical History:  Procedure Laterality Date  . BREAST LUMPECTOMY WITH RADIOACTIVE SEED AND SENTINEL LYMPH NODE BIOPSY Left 08/16/2017   Procedure: BREAST LUMPECTOMY WITH RADIOACTIVE SEED AND SENTINEL LYMPH NODE BIOPSY;  Surgeon: Excell Seltzer, MD;  Location: [place];  Service: General;  Laterality: Left;  . BREAST LUMPECTOMY WITH SENTINEL LYMPH NODE BIOPSY Left 08/16/2017  . BREAST REDUCTION SURGERY Bilateral 08/23/2017   Procedure: LEFT ONCOPLASTIC REDUCTION, RIGHT BREAST REDUCTION;  Surgeon: Irene Limbo, MD;  Location: [place];  Service: Plastics;  Laterality: Bilateral;  . CESAREAN SECTION  03/15/2012   Procedure: CESAREAN SECTION;  Surgeon: Lovenia Kim, MD;  Location: [facility];  Service: Obstetrics;  Laterality: N/A;  Primary cesarean section with delivery of baby girl at 66. Apgars 4/7.  Marland Kitchen CESAREAN SECTION WITH BILATERAL TUBAL LIGATION Bilateral 09/03/2015   Procedure: REPEAT CESAREAN SECTION WITH BILATERAL TUBAL LIGATION;  Surgeon: Brien Few, MD;  Location: [facility];  Service: Obstetrics;  Laterality: Bilateral;  EDD:  09/10/15  . CHOLECYSTECTOMY  12/07/2011   Procedure: LAPAROSCOPIC CHOLECYSTECTOMY;  Surgeon: Harl Bowie, MD;  Location: [facility];  Service: General;  Laterality: N/A;  . PORTACATH PLACEMENT Right 09/11/2017   Procedure: INSERTION PORT-A-CATH;  Surgeon: Excell Seltzer, MD;  [facility];  Service: General;  Laterality: Right;  . ROBOTIC ASSISTED SALPINGO OOPHERECTOMY Bilateral 05/13/2018   Procedure: XI ROBOTIC ASSISTED SALPINGO OOPHORECTOMY;  Surgeon: Brien Few, MD;  Location: [place];  Service: Gynecology;  Laterality: Bilateral;  Requests 90 min.  . WISDOM TOOTH EXTRACTION  2002    There were no vitals filed for this visit.  Subjective Assessment - 10/27/18 1036    Subjective  I haven't been doing exercises lately so it feels a bit worse  (Pended)     How long can you sit comfortably?  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 She had chemo 09/12/17-01/31/18 folowed by radiation.   (Pended)     Patient Stated Goals  To decrease left shoulder pain and tightness  (Pended)     Currently in Pain?  Yes  (Pended)     Pain Score  2   (Pended)    not at rest   Pain Location  Chest  (Pended)     Pain Orientation  Left  (Pended)     Pain Descriptors / Indicators  Tightness  (Pended)  [facility] Adult PT Treatment/Exercise - 10/27/18 0001      Neck Exercises: Supine   Neck Retraction  10 reps;10 secs    Shoulder ABduction  Both;10 reps    Shoulder Abduction Limitations  red band with chin tuck      Shoulder Exercises: Standing   External Rotation  Both;5 reps    Theraband Level (Shoulder External Rotation)  Level 2 (Red)    Flexion  10 reps    Shoulder Flexion Weight (lbs)  3    ABduction  10 reps    Shoulder ABduction Weight (lbs)  3      Manual Therapy   Manual therapy comments  ulnar nerve flossing 3x 10 with elbow flexion and extension x 10 prior to P1     Soft tissue mobilization  to the Lt pectoralis and superior shoulder/UT    Passive ROM  to the Lt shoulder with prolonged holds and STM incorporated ot the pectoralis and deltoid region                  PT Long Term Goals - 10/16/18 1419      PT LONG TERM GOAL #1   Title  Patient will be independent with her shoulder ROM HEP    Status  On-going      PT LONG TERM GOAL #3   Title  Patient will report >/= 50% improvement in shoulder pain and tightness to better tolerate daily tasks.    Status  On-going      PT LONG TERM GOAL #4   Title  Pt will eliminate tingling in the Lt UE     Status  Partially Met      PT LONG TERM GOAL #5   Title  Patient will decrease her DASH score to </= 5 for improved overall shoulder function.    Status  On-going            Plan - 10/27/18 1122    Clinical Impression Statement  Pt reports being stiffer today as she has not done her exercises much.  feeling better upon leaving today.  continued with Lt shoulder/pectoralis mobility and stability as well as nerve glides and chin tucks    PT Next Visit Plan  Lt shoulder mobility and ROM, postural strengthening to include chin tuck, ulnar nerve glides/watching status eventually teach strength ABC       Patient will benefit from skilled therapeutic intervention in order to improve the following deficits and impairments:     Visit Diagnosis: Stiffness of left shoulder, not elsewhere classified  Acute pain of left shoulder  Abnormal posture  Other disturbances of skin sensation  Malignant neoplasm of upper-inner quadrant of left breast in female, estrogen receptor positive [facility])  Aftercare following surgery for neoplasm  Localized edema     Problem List Patient Active Problem List   Diagnosis Date Noted  . Port-A-Cath in place 09/12/2017  . Genetic testing 08/09/2017  . Family history of breast cancer   . Family history of colon cancer   . Family history of cancer   .  Malignant neoplasm of upper-inner quadrant of left breast in female, estrogen receptor positive ([place]) 07/30/2017  . Previous cesarean section 09/05/2015  . Postpartum care following cesarean delivery with btl (4/8) 09/04/2015  . Symptomatic cholelithiasis 11/20/2011    Shan Levans, PT 10/27/2018, 11:24 AM  [place], [place], [address] Phone: [phone]   Fax:  [phone]  Name: Sabrina  ASLYNN [name] MRN: [id number] Date of Birth: 1981-04-27

## 2018-11-10 ENCOUNTER — Other Ambulatory Visit: Payer: Self-pay

## 2018-11-10 ENCOUNTER — Ambulatory Visit: Payer: BC Managed Care – PPO | Admitting: Rehabilitation

## 2018-11-10 ENCOUNTER — Encounter: Payer: Self-pay | Admitting: Rehabilitation

## 2018-11-10 DIAGNOSIS — Z483 Aftercare following surgery for neoplasm: Secondary | ICD-10-CM | POA: Diagnosis not present

## 2018-11-10 DIAGNOSIS — M25612 Stiffness of left shoulder, not elsewhere classified: Secondary | ICD-10-CM | POA: Diagnosis not present

## 2018-11-10 DIAGNOSIS — R208 Other disturbances of skin sensation: Secondary | ICD-10-CM | POA: Diagnosis not present

## 2018-11-10 DIAGNOSIS — M25512 Pain in left shoulder: Secondary | ICD-10-CM

## 2018-11-10 DIAGNOSIS — C50212 Malignant neoplasm of upper-inner quadrant of left female breast: Secondary | ICD-10-CM | POA: Diagnosis not present

## 2018-11-10 DIAGNOSIS — R293 Abnormal posture: Secondary | ICD-10-CM

## 2018-11-10 DIAGNOSIS — Z170 Estrogen receptor positive status [ER+]: Secondary | ICD-10-CM | POA: Diagnosis not present

## 2018-11-10 DIAGNOSIS — R600 Localized edema: Secondary | ICD-10-CM | POA: Diagnosis not present

## 2018-11-10 NOTE — Therapy (Signed)
[place], [place], [address] Phone: [phone]   Fax:  [phone]  Physical Therapy Treatment  Patient Details  Name: [name] MRN: [id number] Date of Birth: 12-15-1980 Referring Provider (PT): Dr. Nicholas Lose   Encounter Date: 11/10/2018  PT End of Session - 11/10/18 1121    Visit Number  6    Number of Visits  8    Date for PT Re-Evaluation  11/13/18    PT Start Time  1030    PT Stop Time  1115    PT Time Calculation (min)  45 min    Activity Tolerance  Patient tolerated treatment well    Behavior During Therapy  [facility] for tasks assessed/performed       Past Medical History:  Diagnosis Date  . Anxiety   . Breast cancer, left ([place]) 07/2017  . Family history of breast cancer   . Family history of colon cancer     Past Surgical History:  Procedure Laterality Date  . BREAST LUMPECTOMY WITH RADIOACTIVE SEED AND SENTINEL LYMPH NODE BIOPSY Left 08/16/2017   Procedure: BREAST LUMPECTOMY WITH RADIOACTIVE SEED AND SENTINEL LYMPH NODE BIOPSY;  Surgeon: Excell Seltzer, MD;  Location: [facility];  Service: General;  Laterality: Left;  . BREAST LUMPECTOMY WITH SENTINEL LYMPH NODE BIOPSY Left 08/16/2017  . BREAST REDUCTION SURGERY Bilateral 08/23/2017   Procedure: LEFT ONCOPLASTIC REDUCTION, RIGHT BREAST REDUCTION;  Surgeon: Irene Limbo, MD;  Location: [facility];  Service: Plastics;  Laterality: Bilateral;  . CESAREAN SECTION  03/15/2012   Procedure: CESAREAN SECTION;  Surgeon: Lovenia Kim, MD;  Location: [facility];  Service: Obstetrics;  Laterality: N/A;  Primary cesarean section with delivery of baby girl at 5. Apgars 4/7.  Marland Kitchen CESAREAN SECTION WITH BILATERAL TUBAL LIGATION Bilateral 09/03/2015   Procedure: REPEAT CESAREAN SECTION WITH BILATERAL TUBAL LIGATION;  Surgeon: Brien Few, MD;  Location: [facility];  Service: Obstetrics;  Laterality: Bilateral;   EDD: 09/10/15  . CHOLECYSTECTOMY  12/07/2011   Procedure: LAPAROSCOPIC CHOLECYSTECTOMY;  Surgeon: Harl Bowie, MD;  Location: [facility];  Service: General;  Laterality: N/A;  . PORTACATH PLACEMENT Right 09/11/2017   Procedure: INSERTION PORT-A-CATH;  Surgeon: Excell Seltzer, MD;  Location: [facility];  Service: General;  Laterality: Right;  . ROBOTIC ASSISTED SALPINGO OOPHERECTOMY Bilateral 05/13/2018   Procedure: XI ROBOTIC ASSISTED SALPINGO OOPHORECTOMY;  Surgeon: Brien Few, MD;  Location: [facility];  Service: Gynecology;  Laterality: Bilateral;  Requests 90 min.  . WISDOM TOOTH EXTRACTION  2002    There were no vitals filed for this visit.  Subjective Assessment - 11/10/18 1035    Subjective  I am doing okay. The tingling is still here but less frequent.  I have been trying to do some weight lifting and some pushups and pushups are the just weird    Currently in Pain?  No/denies         [facility] PT Assessment - 11/10/18 0001      AROM   Left Shoulder Flexion  160 Degrees    Left Shoulder ABduction  159 Degrees   tightness    Left Shoulder Internal Rotation  60 Degrees    Left Shoulder External Rotation  80 Degrees                   [facility] Adult PT Treatment/Exercise - 11/10/18 0001      Shoulder Exercises: Supine   Other Supine Exercises  supine on long foam roll pectoralis major and minor stretch Left with pillow support as needed for an appropriate amount of stretch. 2# alternating flexion x 10 bil, pro/ret 2# x 10, Lt D2 flexion 2# x 10, , toe reaches x 10 bil. Pt thinks she may have a FR at home but was given ordering information if not as well as HEP containing all foam roller work     Other Supine Exercises  FR thoracic mobilization extensionx 3                   PT Long Term Goals - 11/10/18 1124      PT LONG TERM GOAL #1   Title  Patient will be independent with her shoulder ROM HEP    Status  On-going      PT LONG TERM  GOAL #2   Title  Patient will demonstrate active ROM flexion and abduction has returned to 160 degrees for increased ease reaching overhead.    Status  Partially Met      PT LONG TERM GOAL #3   Title  Patient will report >/= 50% improvement in shoulder pain and tightness to better tolerate daily tasks.    Status  Achieved      PT LONG TERM GOAL #4   Title  Pt will eliminate tingling in the Lt UE     Status  Partially Met      PT LONG TERM GOAL #5   Title  Patient will decrease her DASH score to </= 5 for improved overall shoulder function.    Status  On-going            Plan - 11/10/18 1121    Clinical Impression Statement  Pt has improved flexion AROM by 10 degrees since evaluation but continues with abduction restrictions and pain posteriorly.  Added foam roller and thoracic mobilization today with pt able to improve abduction to equal to the Right side after treatment.  Pt was given foam roller ordering information as well as foam roller home program to start.  One more visit scheduled will assess needs at htat time.    PT Frequency  1x / week    PT Duration  6 weeks    PT Treatment/Interventions  ADLs/Self Care Home Management;Therapeutic exercise;Patient/family education;Passive range of motion;Manual lymph drainage;Manual techniques;Therapeutic activities    PT Next Visit Plan  QDASH if last visit, have or order a foam roller? Need any more visits? Lt shoulder mobility and ROM, postural strengthening to include chin tuck, ulnar nerve glides/watching status eventually teach strength ABC    PT Home Exercise Plan  post op and Access Code: [email or web address] for foam roller       Patient will benefit from skilled therapeutic intervention in order to improve the following deficits and impairments:     Visit Diagnosis: Stiffness of left shoulder, not elsewhere classified  Abnormal posture  Acute pain of left shoulder  Other disturbances of skin sensation  Aftercare  following surgery for neoplasm     Problem List Patient Active Problem List   Diagnosis Date Noted  . Port-A-Cath in place 09/12/2017  . Genetic testing 08/09/2017  . Family history of breast cancer   . Family history of colon cancer   . Family history of cancer   . Malignant neoplasm of upper-inner quadrant of left breast in female, estrogen receptor positive ([place]) 07/30/2017  . Previous cesarean section 09/05/2015  . Postpartum care following cesarean delivery with btl (4/8)  09/04/2015  . Symptomatic cholelithiasis 11/20/2011   Shan Levans, PT 11/10/2018, 11:26 AM  [place], [place], [address] Phone: [phone]   Fax:  [phone]  Name: [name] MRN: [id number] Date of Birth: Oct 11, 1980

## 2018-11-17 ENCOUNTER — Other Ambulatory Visit: Payer: Self-pay

## 2018-11-17 ENCOUNTER — Encounter: Payer: Self-pay | Admitting: Rehabilitation

## 2018-11-17 ENCOUNTER — Ambulatory Visit: Payer: BC Managed Care – PPO | Admitting: Rehabilitation

## 2018-11-17 DIAGNOSIS — R293 Abnormal posture: Secondary | ICD-10-CM | POA: Diagnosis not present

## 2018-11-17 DIAGNOSIS — M25612 Stiffness of left shoulder, not elsewhere classified: Secondary | ICD-10-CM | POA: Diagnosis not present

## 2018-11-17 DIAGNOSIS — C50212 Malignant neoplasm of upper-inner quadrant of left female breast: Secondary | ICD-10-CM | POA: Diagnosis not present

## 2018-11-17 DIAGNOSIS — Z170 Estrogen receptor positive status [ER+]: Secondary | ICD-10-CM | POA: Diagnosis not present

## 2018-11-17 DIAGNOSIS — M25512 Pain in left shoulder: Secondary | ICD-10-CM | POA: Diagnosis not present

## 2018-11-17 DIAGNOSIS — Z483 Aftercare following surgery for neoplasm: Secondary | ICD-10-CM | POA: Diagnosis not present

## 2018-11-17 DIAGNOSIS — R600 Localized edema: Secondary | ICD-10-CM | POA: Diagnosis not present

## 2018-11-17 DIAGNOSIS — R208 Other disturbances of skin sensation: Secondary | ICD-10-CM | POA: Diagnosis not present

## 2018-11-17 NOTE — Therapy (Signed)
[place], [place], [address] Phone: [phone]   Fax:  [phone]  Physical Therapy Treatment  Patient Details  Name: [name] MRN: [id number] Date of Birth: 1981/01/29 Referring Provider (PT): Dr. Nicholas Lose   Encounter Date: 11/17/2018  PT End of Session - 11/17/18 1130    Visit Number  7    Number of Visits  8    Date for PT Re-Evaluation  11/13/18    PT Start Time  1030    PT Stop Time  1115    PT Time Calculation (min)  45 min    Activity Tolerance  Patient tolerated treatment well    Behavior During Therapy  [facility] for tasks assessed/performed       Past Medical History:  Diagnosis Date  . Anxiety   . Breast cancer, left ([place]) 07/2017  . Family history of breast cancer   . Family history of colon cancer     Past Surgical History:  Procedure Laterality Date  . BREAST LUMPECTOMY WITH RADIOACTIVE SEED AND SENTINEL LYMPH NODE BIOPSY Left 08/16/2017   Procedure: BREAST LUMPECTOMY WITH RADIOACTIVE SEED AND SENTINEL LYMPH NODE BIOPSY;  Surgeon: Excell Seltzer, MD;  Location: [facility];  Service: General;  Laterality: Left;  . BREAST LUMPECTOMY WITH SENTINEL LYMPH NODE BIOPSY Left 08/16/2017  . BREAST REDUCTION SURGERY Bilateral 08/23/2017   Procedure: LEFT ONCOPLASTIC REDUCTION, RIGHT BREAST REDUCTION;  Surgeon: Irene Limbo, MD;  Location: [facility];  Service: Plastics;  Laterality: Bilateral;  . CESAREAN SECTION  03/15/2012   Procedure: CESAREAN SECTION;  Surgeon: Lovenia Kim, MD;  Location: [facility];  Service: Obstetrics;  Laterality: N/A;  Primary cesarean section with delivery of baby girl at 81. Apgars 4/7.  Marland Kitchen CESAREAN SECTION WITH BILATERAL TUBAL LIGATION Bilateral 09/03/2015   Procedure: REPEAT CESAREAN SECTION WITH BILATERAL TUBAL LIGATION;  Surgeon: Brien Few, MD;  Location: [facility];  Service: Obstetrics;  Laterality: Bilateral;   EDD: 09/10/15  . CHOLECYSTECTOMY  12/07/2011   Procedure: LAPAROSCOPIC CHOLECYSTECTOMY;  Surgeon: Harl Bowie, MD;  Location: [facility];  Service: General;  Laterality: N/A;  . PORTACATH PLACEMENT Right 09/11/2017   Procedure: INSERTION PORT-A-CATH;  Surgeon: Excell Seltzer, MD;  [facility];  Service: General;  Laterality: Right;  . ROBOTIC ASSISTED SALPINGO OOPHERECTOMY Bilateral 05/13/2018   Procedure: XI ROBOTIC ASSISTED SALPINGO OOPHORECTOMY;  Surgeon: Brien Few, MD;  Location: [facility];  Service: Gynecology;  Laterality: Bilateral;  Requests 90 min.  . WISDOM TOOTH EXTRACTION  2002    There were no vitals filed for this visit.  Subjective Assessment - 11/17/18 1033    Subjective  The shoulder is doing okay.  I had a foam roller but have only done it one time.  Not sure if I need more visits or not.  The tingling is good overall.    Currently in Pain?  No/denies   only with some reaches        [facility] PT Assessment - 11/17/18 0001      AROM   Left Shoulder Flexion  160 Degrees    Left Shoulder ABduction  162 Degrees    Left Shoulder Internal Rotation  60 Degrees    Left Shoulder External Rotation  85 Degrees      Strength   Strength Assessment Site  Shoulder    Right/Left Shoulder  Left    Left Shoulder Flexion  4+/5    Left Shoulder Extension  4+/5    Left Shoulder ABduction  4/5    Left Shoulder Internal Rotation  4+/5    Left Shoulder External Rotation  4-/5           Quick Dash - 11/17/18 0001    Open a tight or new jar  No difficulty    Do heavy household chores (wash walls, wash floors)  Mild difficulty    Carry a shopping bag or briefcase  No difficulty    Wash your back  Mild difficulty    Use a knife to cut food  No difficulty    Recreational activities in which you take some force or impact through your arm, shoulder, or hand (golf, hammering, tennis)  Mild difficulty    During the past week, to what extent has your arm,  shoulder or hand problem interfered with your normal social activities with family, friends, neighbors, or groups?  Not at all    During the past week, to what extent has your arm, shoulder or hand problem limited your work or other regular daily activities  Not at all    Arm, shoulder, or hand pain.  Mild    Tingling (pins and needles) in your arm, shoulder, or hand  Mild    Difficulty Sleeping  Mild difficulty    DASH Score  13.64 %             [facility] Adult PT Treatment/Exercise - 11/17/18 0001      Exercises   Other Exercises   reviewed final HEP; to consist of original exercises with increasing resistance bands given, foam roll stretches, and given strength ABC program to transition to when ready.  Demo of all exercises on strength ABC with pt performing all shoulder exercises and core for cueing.        Manual Therapy   Passive ROM  to the Lt shoulder all directions                  PT Long Term Goals - 11/17/18 1036      PT LONG TERM GOAL #1   Title  Patient will be independent with her shoulder ROM HEP    Status  Achieved      PT LONG TERM GOAL #2   Title  Patient will demonstrate active ROM flexion and abduction has returned to 160 degrees for increased ease reaching overhead.    Baseline  flex: 160    abd: 162    Status  Achieved      PT LONG TERM GOAL #3   Title  Patient will report >/= 50% improvement in shoulder pain and tightness to better tolerate daily tasks.    Status  Achieved      PT LONG TERM GOAL #4   Title  Pt will eliminate tingling in the Lt UE     Status  Partially Met      PT LONG TERM GOAL #5   Title  Patient will decrease her DASH score to </= 5 for improved overall shoulder function.    Baseline  13%    Status  Partially Met            Plan - 11/17/18 1031    Clinical Impression Statement  Pt is ready to attempt ind with HEP.  Pt should continue to improve shoulder mobility and decreased tingling/keep improvements if able  to stay consistent with her HEP which she has been struggling with a bit.  All goals were met except  DASH score.  Pt with joint hypermobility overall so despite normal shoulder ROM she still finds that it can be tight.  Pt will return if not going well at home.       Patient will benefit from skilled therapeutic intervention in order to improve the following deficits and impairments:  Decreased range of motion, Impaired UE functional use, Pain, Increased fascial restricitons, Postural dysfunction  Visit Diagnosis: 1. Stiffness of left shoulder, not elsewhere classified   2. Acute pain of left shoulder   3. Abnormal posture        Problem List Patient Active Problem List   Diagnosis Date Noted  . Port-A-Cath in place 09/12/2017  . Genetic testing 08/09/2017  . Family history of breast cancer   . Family history of colon cancer   . Family history of cancer   . Malignant neoplasm of upper-inner quadrant of left breast in female, estrogen receptor positive ([place]) 07/30/2017  . Previous cesarean section 09/05/2015  . Postpartum care following cesarean delivery with btl (4/8) 09/04/2015  . Symptomatic cholelithiasis 11/20/2011    Shan Levans, PT 11/17/2018, 11:32 AM  [place], [place], [address] Phone: [phone]   Fax:  [phone]  Name: [name] MRN: [id number] Date of Birth: May 02, 1981

## 2018-11-24 DIAGNOSIS — Z20828 Contact with and (suspected) exposure to other viral communicable diseases: Secondary | ICD-10-CM | POA: Diagnosis not present

## 2018-12-09 ENCOUNTER — Telehealth: Payer: Self-pay | Admitting: *Deleted

## 2018-12-09 NOTE — Telephone Encounter (Signed)
Scheduled and confirmed mammogram for 12/30/18 at 9:15. Informed pt will call with appt for breast MRI under sedation. Received verbal understanding.

## 2018-12-19 NOTE — Progress Notes (Signed)
Patient Care Team: Jonathon Jordan, MD as PCP - General (Family Medicine) Benson Norway, RN as Registered Nurse (Oncology) Nicholas Lose, MD as Consulting Physician (Hematology and Oncology) Kyung Rudd, MD as Consulting Physician (Radiation Oncology) Excell Seltzer, MD as Consulting Physician (General Surgery)  DIAGNOSIS:    ICD-10-CM   1. Malignant neoplasm of upper-inner quadrant of left breast in female, estrogen receptor positive ([place])  C50.212    Z17.0     SUMMARY OF ONCOLOGIC HISTORY: Oncology History  Malignant neoplasm of upper-inner quadrant of left breast in female, estrogen receptor positive ([place])  07/24/2017 Initial Diagnosis   Left breast palpable lump upper inner quadrant 11 o'clock position: Ill-defined 1.6 cm mass, axilla ultrasound negative, ultrasound-guided biopsy: Grade 2 IDC ER 90%, PR 40%, Ki-67 15%, HER-2 negative   08/09/2017 Genetic Testing   Negative genetic testing common hereditary cancer panel.  The Hereditary Gene Panel offered by [facility] includes sequencing and/or deletion duplication testing of the following 47 genes: APC, ATM, AXIN2, BARD1, BMPR1A, BRCA1, BRCA2, BRIP1, CDH1, CDK4, CDKN2A (p14ARF), CDKN2A (p16INK4a), CHEK2, CTNNA1, DICER1, EPCAM (Deletion/duplication testing only), GREM1 (promoter region deletion/duplication testing only), KIT, MEN1, MLH1, MSH2, MSH3, MSH6, MUTYH, NBN, NF1, NHTL1, PALB2, PDGFRA, PMS2, POLD1, POLE, PTEN, RAD50, RAD51C, RAD51D, SDHB, SDHC, SDHD, SMAD4, SMARCA4. STK11, TP53, TSC1, TSC2, and VHL.  The following genes were evaluated for sequence changes only: SDHA and HOXB13 c.251G>A variant only. The report date is August 07, 2017.   08/16/2017 Surgery   Left lumpectomy: IDC grade 2, 2.5 cm, DCIS intermediate grade, lymphovascular invasion identified, margins negative, 1/2 lymph nodes positive with extracapsular extension, ER 90%, PR 40%, HER-2 negative ratio 1.15, Ki-67 15% T2 N1a stage II a; Mammaprint high risk      08/16/2017 Miscellaneous   MammaPrint 08/16/17  High risk with average 10-year Risk of recurrence Untreated: 29% MPI: -0.399 94.6% benefit of chemotherapy and Hormonal therapy at 5 years from distant recurrence.    09/12/2017 - 01/31/2018 Chemotherapy   Adjuvant chemotherapy with dose dense Adriamycin and Cytoxan x4 followed by Taxol weekly x12    02/24/2018 - 04/09/2018 Radiation Therapy   Radiation therapy with Dr. Lisbeth Renshaw 02/24/18-04/09/18   05/13/2018 Surgery   Bilateral salpingo-oophorectomy with lysis of left adnexal adhesions   05/28/2018 -  Anti-estrogen oral therapy   Anastrozole: Discontinued due to joint pains and stiffness, switched to letrozole 07/14/2018     CHIEF COMPLIANT: Follow-up of left breast cancer on letrozole  INTERVAL HISTORY: [name] is a 38 y.o. with above-mentioned history of left breast cancer treated with lumpectomy, adjuvant chemotherapy, radiation and bilateral salpingo-oophorectomy. She is currently on anti-estrogen therapy with letrozole. Mammogram on 08/11/18 showed no evidence of malignancy. She is a participant in the [place] clinical trial. She presents to the clinic today for follow-up prior to her breast MRI on 01/01/19.  She is tolerating letrozole much better than anastrozole. Muscle aches and pains have improved.  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  I have reviewed the past medical history, past surgical history, social history and family history with the patient and they are unchanged from previous note.  ALLERGIES:  has No Known Allergies.  MEDICATIONS:  Current Outpatient Medications  Medication Sig Dispense Refill   CLOTRIMAZOLE-BETAMETHASONE EX Apply 1 application topically at bedtime. Applied at night     escitalopram (LEXAPRO) 20 MG tablet Take 1 tablet (20 mg total) by mouth daily with supper. 90 tablet 3   letrozole (FEMARA) 2.5 MG tablet Take 1 tablet (2.5 mg total) by mouth daily. 90 tablet 3   No current facility-administered medications for this visit.    Facility-Administered Medications Ordered in Other Visits  Medication Dose Route Frequency Provider Last Rate Last Dose   heparin lock flush 100 unit/mL  500 Units Intracatheter Once PRN Nicholas Lose, MD       sodium chloride flush (NS) 0.9 % injection 10 mL  10 mL Intracatheter PRN Nicholas Lose, MD   10 mL at 11/15/17 1449   sodium chloride flush (NS) 0.9 % injection 10 mL  10 mL Intracatheter PRN Nicholas Lose, MD       sodium chloride flush (NS) 0.9 % injection 10 mL  10 mL Intracatheter PRN Nicholas Lose, MD   10 mL at 12/13/17 1500    PHYSICAL EXAMINATION: ECOG PERFORMANCE STATUS: 1 - Symptomatic but completely ambulatory  Vitals:   12/22/18 1001  BP: 129/83  Pulse: (!) 58  Resp: 18  Temp: 98.3 F (36.8 C)  SpO2: 100%   Filed Weights   12/22/18 1001  Weight: 215 lb 4.8 oz (97.7 kg)    GENERAL: alert, no distress and comfortable SKIN: skin color, texture, turgor are normal, no rashes or significant lesions EYES: normal, Conjunctiva are pink and non-injected, sclera clear OROPHARYNX: no exudate, no erythema and lips, buccal mucosa, and tongue normal  NECK: supple, thyroid normal size, non-tender, without nodularity LYMPH: no palpable  lymphadenopathy in the cervical, axillary or inguinal LUNGS: clear to auscultation and percussion with normal breathing effort HEART: regular rate & rhythm and no murmurs and no lower extremity edema ABDOMEN: abdomen soft, non-tender and normal bowel sounds MUSCULOSKELETAL: no cyanosis of digits and no clubbing  NEURO: alert & oriented x 3 with fluent speech, pins-and-needles in the little and ring fingers left hand EXTREMITIES: No lower extremity edema BREAST: No palpable masses or nodules in either right or left breasts. No palpable axillary supraclavicular or infraclavicular adenopathy no breast tenderness or nipple discharge. (exam performed in the presence of a chaperone)  LABORATORY DATA:  I have reviewed the data as listed CMP Latest Ref Rng & Units 01/31/2018 01/24/2018 01/17/2018  Glucose 70 - 99 mg/dL 141(H) 161(H) 133(H)  BUN 6 - 20 mg/dL '13 10 11  '$ Creatinine 0.44 - 1.00 mg/dL 0.83 0.83 0.79  Sodium 135 - 145 mmol/L 140 141 142  Potassium 3.5 - 5.1 mmol/L 3.9 3.9 4.1  Chloride 98 - 111 mmol/L 106 106 107  CO2 22 - 32 mmol/L '24 26 25  '$ Calcium 8.9 - 10.3 mg/dL 8.8(L) 8.9 8.8(L)  Total Protein 6.5 - 8.1 g/dL 6.4(L) 6.3(L) 6.3(L)  Total Bilirubin 0.3 - 1.2 mg/dL 0.5 0.4 0.3  Alkaline Phos 38 - 126 U/L 76 72 87  AST 15 - 41 U/L 32 33 31  ALT 0 - 44 U/L 45(H) 51(H) 51(H)    Lab Results  Component Value Date   WBC 5.4 05/08/2018  HGB 12.3 05/08/2018   HCT 36.4 05/08/2018   MCV 90.1 05/08/2018   PLT 201 05/08/2018   NEUTROABS 3.9 01/31/2018    ASSESSMENT & PLAN:  Malignant neoplasm of upper-inner quadrant of left breast in female, estrogen receptor positive ([place]) 08/19/2017:Left lumpectomy: IDC grade 2, 2.5 cm, DCIS intermediate grade, lymphovascular invasion identified, margins negative, 1/2 lymph nodes positive with extracapsular extension, ER 90%, PR 40%, HER-2 negative ratio 1.15, Ki-67 15% T2 N1a stage II a; Mammaprint high risk   Recommendation: 1. Adjuvant  chemotherapy with dose dense Adriamycin and Cytoxan x4 followed by Taxol weekly x12 completed 01/31/2018 2. followed by adjuvant radiation therapywhich will be completed 04/09/2018 3.Bilateral salpingo-oophorectomy 05/15/2018 4.Followed by adjuvant antiestrogen therapy;will enroll the patient in [place] clinical trial _______________________________________________________________________________________________  Surveillance: Breast MRI August 2020 with sedation (I did not see any contraindications for sedation.)  Current treatment: Anastrozole 1 mg daily started 06/02/2018 discontinued 07/02/2018 due to diffuse muscle aches and pains, switched to letrozole 07/14/2018  Letrozole toxicities: Occasional hot flashes and mild muscle stiffness but significant improvement from anastrozole.  Arthralgias/myalgias: Much improved on letrozole. 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  She was given exercises by physical therapy.  If they do not respond then we will need to refer her to orthopedics. I also instructed her to start taking turmeric daily.  Bone density 07/01/2018: Normal T score 0.1 Patient was previously offered information on ABC and B well clinical trials  I will touch base with her through a virtual visit day after the MRI. After that we can see her in 6 months and after that once a year.  No orders of the defined types were placed in this encounter.  The patient has a good understanding of the overall plan. she agrees with it. she will call with any problems that may develop before the next visit here.  Nicholas Lose, MD 12/22/2018  Julious Oka Dorshimer am acting as scribe for Dr. Nicholas Lose.  I have reviewed the above documentation for accuracy and completeness, and I agree with the above.

## 2018-12-22 ENCOUNTER — Other Ambulatory Visit: Payer: Self-pay

## 2018-12-22 ENCOUNTER — Inpatient Hospital Stay: Payer: BC Managed Care – PPO | Attending: Hematology and Oncology | Admitting: Hematology and Oncology

## 2018-12-22 DIAGNOSIS — M2550 Pain in unspecified joint: Secondary | ICD-10-CM | POA: Diagnosis not present

## 2018-12-22 DIAGNOSIS — C50212 Malignant neoplasm of upper-inner quadrant of left female breast: Secondary | ICD-10-CM | POA: Insufficient documentation

## 2018-12-22 DIAGNOSIS — Z79899 Other long term (current) drug therapy: Secondary | ICD-10-CM | POA: Insufficient documentation

## 2018-12-22 DIAGNOSIS — Z170 Estrogen receptor positive status [ER+]: Secondary | ICD-10-CM | POA: Diagnosis not present

## 2018-12-22 DIAGNOSIS — Z79811 Long term (current) use of aromatase inhibitors: Secondary | ICD-10-CM | POA: Insufficient documentation

## 2018-12-22 DIAGNOSIS — Z9221 Personal history of antineoplastic chemotherapy: Secondary | ICD-10-CM | POA: Insufficient documentation

## 2018-12-22 DIAGNOSIS — Z923 Personal history of irradiation: Secondary | ICD-10-CM

## 2018-12-22 NOTE — Assessment & Plan Note (Signed)
08/19/2017:Left lumpectomy: IDC grade 2, 2.5 cm, DCIS intermediate grade, lymphovascular invasion identified, margins negative, 1/2 lymph nodes positive with extracapsular extension, ER 90%, PR 40%, HER-2 negative ratio 1.15, Ki-67 15% T2 N1a stage II a; Mammaprint high risk   Recommendation: 1. Adjuvant chemotherapy with dose dense Adriamycin and Cytoxan x4 followed by Taxol weekly x12 completed 01/31/2018 2. followed by adjuvant radiation therapywhich will be completed 04/09/2018 3.Bilateral salpingo-oophorectomy 05/15/2018 4.Followed by adjuvant antiestrogen therapy;will enroll the patient in Nat[facility]e clinical trial _______________________________________________________________________________________________  Surveillance: Breast MRI August 2020 with sedation  Current treatment: Anastrozole 1 mg daily started 06/02/2018 discontinued 07/02/2018 due to diffuse muscle aches and pains, switched to letrozole 07/14/2018  Letrozole toxicities:  Arthralgias/myalgias: Due to anastrozole therapy. Bone density 07/01/2018: Normal T score 0.1 Patient was offered information on ABC and B well clinical trials

## 2018-12-23 ENCOUNTER — Telehealth: Payer: Self-pay | Admitting: Hematology and Oncology

## 2018-12-23 NOTE — Telephone Encounter (Signed)
I left a message regarding schedule I will mail °

## 2018-12-26 NOTE — Assessment & Plan Note (Addendum)
08/19/2017:Left lumpectomy: IDC grade 2, 2.5 cm, DCIS intermediate grade, lymphovascular invasion identified, margins negative, 1/2 lymph nodes positive with extracapsular extension, ER 90%, PR 40%, HER-2 negative ratio 1.15, Ki-67 15% T2 N1a stage II a; Mammaprint high risk   Recommendation: 1. Adjuvant chemotherapy with dose dense Adriamycin and Cytoxan x4 followed by Taxol weekly x12 completed 01/31/2018 2. followed by adjuvant radiation therapywhich will be completed 04/09/2018 3.Bilateral salpingo-oophorectomy 05/15/2018 4.Followed by adjuvant antiestrogen therapy;will enroll the patient in Natalee clinical trial _______________________________________________________________________________________________  Surveillance:Breast MRI August 2020 with sedation (I did not see any contraindications for sedation.)  Current treatment: Anastrozole 1 mg daily started 06/02/2018 discontinued 07/02/2018 due to diffuse muscle aches and pains, switched to letrozole 07/14/2018  Letrozole toxicities: Occasional hot flashes and mild muscle stiffness but significant improvement from anastrozole.  Arthralgias/myalgias: Much improved on letrozole. 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  She was given exercises by physical therapy.  If they do not respond then we will need to refer her to orthopedics. I also instructed her to start taking turmeric daily.  Bone density 07/01/2018: Normal T score 0.1 Patient was previously offered information on ABC and B well clinical trials  Breast MRI 01/01/2019: Follow-up in 6 months and after that we can see her once a year 

## 2018-12-29 ENCOUNTER — Other Ambulatory Visit (HOSPITAL_COMMUNITY)
Admission: RE | Admit: 2018-12-29 | Discharge: 2018-12-29 | Disposition: A | Discharge status: Home / Self Care | Payer: BC Managed Care – PPO | Source: Ambulatory Visit | Attending: Hematology and Oncology | Admitting: Hematology and Oncology

## 2018-12-29 DIAGNOSIS — Z20828 Contact with and (suspected) exposure to other viral communicable diseases: Secondary | ICD-10-CM | POA: Diagnosis not present

## 2018-12-29 DIAGNOSIS — C50919 Malignant neoplasm of unspecified site of unspecified female breast: Secondary | ICD-10-CM | POA: Insufficient documentation

## 2018-12-29 DIAGNOSIS — Z01812 Encounter for preprocedural laboratory examination: Secondary | ICD-10-CM | POA: Diagnosis not present

## 2018-12-29 LAB — SARS CORONAVIRUS 2 (TAT 6-24 HRS): SARS Coronavirus 2: NEGATIVE

## 2018-12-30 ENCOUNTER — Encounter (HOSPITAL_COMMUNITY): Payer: Self-pay | Admitting: *Deleted

## 2018-12-30 ENCOUNTER — Telehealth: Payer: Self-pay

## 2018-12-30 ENCOUNTER — Other Ambulatory Visit: Payer: Self-pay

## 2018-12-30 DIAGNOSIS — Z853 Personal history of malignant neoplasm of breast: Secondary | ICD-10-CM | POA: Diagnosis not present

## 2018-12-30 NOTE — Progress Notes (Signed)
Spoke with pt for pre-op call. Pt denies cardiac hx, HTN or diabetes.   Pt had Covid test on 12/29/18 and it was negative. She states she's been in quarantine since. She does have a doctor's appt this afternoon and she will wear her mask and social distance.   Coronavirus Screening  Have you experienced the following symptoms:  Cough NO Fever (>100.17F)  NO Runny nose NO Sore throat NO Difficulty breathing/shortness of breath  NO  Have you or a family member traveled in the last 14 days and where? NO  Patient reminde that hospital visitation restrictions are in effect and the importance of the restrictions. Pt informed that she may have 1 visitor that may wait in the waiting area while she is in pre-op, MRI and PACU. She voiced understanding.

## 2018-12-30 NOTE — Telephone Encounter (Signed)
TC to Pt.to inform her of negative covid test results. Pt verbalized understanding.

## 2018-12-30 NOTE — Telephone Encounter (Signed)
-----   Message from Gardenia Phlegm, NP sent at 12/30/2018  8:02 AM EDT ----- Please call patient with negative results ----- Message ----- From: Interface, Lab In [place] Sent: 12/29/2018   5:57 PM EDT To: Nicholas Lose, MD

## 2018-12-31 NOTE — Anesthesia Preprocedure Evaluation (Addendum)
Anesthesia Evaluation  Patient identified by MRN, date of birth, ID band Patient awake    Reviewed: Allergy & Precautions, NPO status , Patient's Chart, lab work & pertinent test results  Airway Mallampati: II  TM Distance: >3 FB Neck ROM: Full    Dental  (+) Teeth Intact, Dental Advisory Given   Pulmonary former smoker,    Pulmonary exam normal breath sounds clear to auscultation       Cardiovascular negative cardio ROS Normal cardiovascular exam Rhythm:Regular Rate:Normal     Neuro/Psych PSYCHIATRIC DISORDERS Anxiety negative neurological ROS     GI/Hepatic negative GI ROS, Neg liver ROS, neg GERD  ,  Endo/Other  Obesity   Renal/GU negative Renal ROS     Musculoskeletal negative musculoskeletal ROS (+)   Abdominal   Peds  Hematology negative hematology ROS (+)   Anesthesia Other Findings Day of surgery medications reviewed with the patient.  malignant neoplasm of breast  Reproductive/Obstetrics                           Anesthesia Physical Anesthesia Plan  ASA: II  Anesthesia Plan: General   Post-op Pain Management:    Induction: Intravenous  PONV Risk Score and Plan: 3 and Ondansetron, Dexamethasone and Midazolam  Airway Management Planned: Oral ETT  Additional Equipment:   Intra-op Plan:   Post-operative Plan: Extubation in OR  Informed Consent: I have reviewed the patients History and Physical, chart, labs and discussed the procedure including the risks, benefits and alternatives for the proposed anesthesia with the patient or authorized representative who has indicated his/her understanding and acceptance.     Dental advisory given  Plan Discussed with: CRNA  Anesthesia Plan Comments:       Anesthesia Quick Evaluation

## 2019-01-01 ENCOUNTER — Encounter (HOSPITAL_COMMUNITY): Payer: Self-pay

## 2019-01-01 ENCOUNTER — Telehealth: Payer: Self-pay | Admitting: Hematology and Oncology

## 2019-01-01 ENCOUNTER — Ambulatory Visit (HOSPITAL_COMMUNITY)
Admission: RE | Admit: 2019-01-01 | Discharge: 2019-01-01 | Disposition: A | Discharge status: Home / Self Care | Payer: BC Managed Care – PPO | Source: Ambulatory Visit | Attending: Adult Health | Admitting: Adult Health

## 2019-01-01 ENCOUNTER — Ambulatory Visit (HOSPITAL_COMMUNITY): Payer: BC Managed Care – PPO | Admitting: Anesthesiology

## 2019-01-01 ENCOUNTER — Ambulatory Visit (HOSPITAL_COMMUNITY)
Admission: RE | Admit: 2019-01-01 | Discharge: 2019-01-01 | Disposition: A | Discharge status: Home / Self Care | Payer: BC Managed Care – PPO | Attending: Hematology and Oncology | Admitting: Hematology and Oncology

## 2019-01-01 ENCOUNTER — Other Ambulatory Visit: Payer: Self-pay

## 2019-01-01 ENCOUNTER — Encounter (HOSPITAL_COMMUNITY)
Admission: RE | Disposition: A | Discharge status: Home / Self Care | Payer: Self-pay | Source: Home / Self Care | Attending: Hematology and Oncology

## 2019-01-01 DIAGNOSIS — N6321 Unspecified lump in the left breast, upper outer quadrant: Secondary | ICD-10-CM | POA: Insufficient documentation

## 2019-01-01 DIAGNOSIS — Z79899 Other long term (current) drug therapy: Secondary | ICD-10-CM | POA: Insufficient documentation

## 2019-01-01 DIAGNOSIS — Z1231 Encounter for screening mammogram for malignant neoplasm of breast: Secondary | ICD-10-CM | POA: Insufficient documentation

## 2019-01-01 DIAGNOSIS — Z79811 Long term (current) use of aromatase inhibitors: Secondary | ICD-10-CM | POA: Insufficient documentation

## 2019-01-01 DIAGNOSIS — K219 Gastro-esophageal reflux disease without esophagitis: Secondary | ICD-10-CM | POA: Diagnosis not present

## 2019-01-01 DIAGNOSIS — Z923 Personal history of irradiation: Secondary | ICD-10-CM | POA: Diagnosis not present

## 2019-01-01 DIAGNOSIS — Z87891 Personal history of nicotine dependence: Secondary | ICD-10-CM | POA: Insufficient documentation

## 2019-01-01 DIAGNOSIS — N6320 Unspecified lump in the left breast, unspecified quadrant: Secondary | ICD-10-CM | POA: Diagnosis not present

## 2019-01-01 DIAGNOSIS — Z9221 Personal history of antineoplastic chemotherapy: Secondary | ICD-10-CM | POA: Diagnosis not present

## 2019-01-01 DIAGNOSIS — F419 Anxiety disorder, unspecified: Secondary | ICD-10-CM | POA: Diagnosis not present

## 2019-01-01 DIAGNOSIS — C50212 Malignant neoplasm of upper-inner quadrant of left female breast: Secondary | ICD-10-CM | POA: Diagnosis present

## 2019-01-01 DIAGNOSIS — C50919 Malignant neoplasm of unspecified site of unspecified female breast: Secondary | ICD-10-CM | POA: Diagnosis not present

## 2019-01-01 DIAGNOSIS — Z803 Family history of malignant neoplasm of breast: Secondary | ICD-10-CM | POA: Diagnosis not present

## 2019-01-01 HISTORY — PX: RADIOLOGY WITH ANESTHESIA: SHX6223

## 2019-01-01 LAB — BASIC METABOLIC PANEL
Anion gap: 9 (ref 5–15)
BUN: 19 mg/dL (ref 6–20)
CO2: 23 mmol/L (ref 22–32)
Calcium: 9 mg/dL (ref 8.9–10.3)
Chloride: 107 mmol/L (ref 98–111)
Creatinine, Ser: 0.72 mg/dL (ref 0.44–1.00)
GFR calc Af Amer: >60 mL/min (ref >60)
GFR calc non Af Amer: >60 mL/min (ref >60)
Glucose, Bld: 92 mg/dL (ref 70–99)
Potassium: 3.8 mmol/L (ref 3.5–5.1)
Sodium: 139 mmol/L (ref 135–145)

## 2019-01-01 SURGERY — MRI WITH ANESTHESIA
Anesthesia: General | Laterality: Bilateral

## 2019-01-01 MED ORDER — LACTATED RINGERS IV SOLN
INTRAVENOUS | Status: DC
  Administered 2019-01-01: 07:00:00 via INTRAVENOUS

## 2019-01-01 MED ORDER — PROMETHAZINE HCL 25 MG/ML IJ SOLN: 6.2500 mg | INTRAMUSCULAR | Status: DC | PRN

## 2019-01-01 MED ORDER — GADOBUTROL 1 MMOL/ML IV SOLN
10.0000 mL | Freq: Once | INTRAVENOUS | Status: AC | PRN
  Administered 2019-01-01: 10 mL via INTRAVENOUS

## 2019-01-01 NOTE — Progress Notes (Signed)
HEMATOLOGY-ONCOLOGY DOXIMITY VISIT PROGRESS NOTE  I connected with [name] on 01/02/2019 at  9:00 AM EDT by Doximity video conference and verified that I am speaking with the correct person using two identifiers.  I discussed the limitations, risks, security and privacy concerns of performing an evaluation and management service by Doximity and the availability of in person appointments.  I also discussed with the patient that there may be a patient responsible charge related to this service. The patient expressed understanding and agreed to proceed.  Patient's Location: Home Physician Location: Clinic  CHIEF COMPLIANT: Follow-up of left breast cancer on letrozole to review MRI  INTERVAL HISTORY: [name] is a 38 y.o. female with above-mentioned history of left breast cancer treated with lumpectomy, adjuvant chemotherapy, radiation and bilateral salpingo-oophorectomy. She is currently on anti-estrogen therapy with letrozole. She presents over Doximity today to review her recent MRI.  The MRI showed 6 mm nodule in the surgical scar in the recommending an ultrasound-guided biopsy of that nodule. Once the patient heard these results and she went into a panic mode.  She was crying on the phone.  She wants this done as soon as possible.  The mammogram did not identify any abnormality in this area.  Oncology History  Malignant neoplasm of upper-inner quadrant of left breast in female, estrogen receptor positive ([place])  07/24/2017 Initial Diagnosis   Left breast palpable lump upper inner quadrant 11 o'clock position: Ill-defined 1.6 cm mass, axilla ultrasound negative, ultrasound-guided biopsy: Grade 2 IDC ER 90%, PR 40%, Ki-67 15%, HER-2 negative   08/09/2017 Genetic Testing   Negative genetic testing common hereditary cancer panel.  The Hereditary Gene Panel offered by [facility] includes sequencing and/or deletion duplication testing of the following 47 genes: APC, ATM, AXIN2, BARD1,  BMPR1A, BRCA1, BRCA2, BRIP1, CDH1, CDK4, CDKN2A (p14ARF), CDKN2A (p16INK4a), CHEK2, CTNNA1, DICER1, EPCAM (Deletion/duplication testing only), GREM1 (promoter region deletion/duplication testing only), KIT, MEN1, MLH1, MSH2, MSH3, MSH6, MUTYH, NBN, NF1, NHTL1, PALB2, PDGFRA, PMS2, POLD1, POLE, PTEN, RAD50, RAD51C, RAD51D, SDHB, SDHC, SDHD, SMAD4, SMARCA4. STK11, TP53, TSC1, TSC2, and VHL.  The following genes were evaluated for sequence changes only: SDHA and HOXB13 c.251G>A variant only. The report date is August 07, 2017.   08/16/2017 Surgery   Left lumpectomy: IDC grade 2, 2.5 cm, DCIS intermediate grade, lymphovascular invasion identified, margins negative, 1/2 lymph nodes positive with extracapsular extension, ER 90%, PR 40%, HER-2 negative ratio 1.15, Ki-67 15% T2 N1a stage II a; Mammaprint high risk    08/16/2017 Miscellaneous   MammaPrint 08/16/17  High risk with average 10-year Risk of recurrence Untreated: 29% MPI: -0.399 94.6% benefit of chemotherapy and Hormonal therapy at 5 years from distant recurrence.    09/12/2017 - 01/31/2018 Chemotherapy   Adjuvant chemotherapy with dose dense Adriamycin and Cytoxan x4 followed by Taxol weekly x12   02/24/2018 - 04/09/2018 Radiation Therapy   Radiation therapy with Dr. Lisbeth Renshaw 02/24/18-04/09/18   05/13/2018 Surgery   Bilateral salpingo-oophorectomy with lysis of left adnexal adhesions   05/28/2018 -  Anti-estrogen oral therapy   Anastrozole: Discontinued due to joint pains and stiffness, switched to letrozole 07/14/2018     REVIEW OF SYSTEMS:   Constitutional: Denies fevers, chills or abnormal weight loss Eyes: Denies blurriness of vision Ears, nose, mouth, throat, and face: Denies mucositis or sore throat Respiratory: Denies cough, dyspnea or wheezes Cardiovascular: Denies palpitation, chest discomfort Gastrointestinal:  Denies nausea, heartburn or change in bowel habits Skin: Denies abnormal skin rashes Lymphatics: Denies new lymphadenopathy  or easy bruising Neurological:Denies numbness, tingling or new weaknesses Behavioral/Psych: Mood is stable, no new changes  Extremities: No lower extremity edema Breast: denies any pain or lumps or nodules in either breasts All other systems were reviewed with the patient and are negative.  Observations/Objective:  There were no vitals filed for this visit. There is no height or weight on file to calculate BMI.  I have reviewed the data as listed CMP Latest Ref Rng & Units 01/01/2019 01/31/2018 01/24/2018  Glucose 70 - 99 mg/dL 92 141(H) 161(H)  BUN 6 - 20 mg/dL '19 13 10  '$ Creatinine 0.44 - 1.00 mg/dL 0.72 0.83 0.83  Sodium 135 - 145 mmol/L 139 140 141  Potassium 3.5 - 5.1 mmol/L 3.8 3.9 3.9  Chloride 98 - 111 mmol/L 107 106 106  CO2 22 - 32 mmol/L '23 24 26  '$ Calcium 8.9 - 10.3 mg/dL 9.0 8.8(L) 8.9  Total Protein 6.5 - 8.1 g/dL - 6.4(L) 6.3(L)  Total Bilirubin 0.3 - 1.2 mg/dL - 0.5 0.4  Alkaline Phos 38 - 126 U/L - 76 72  AST 15 - 41 U/L - 32 33  ALT 0 - 44 U/L - 45(H) 51(H)    Lab Results  Component Value Date   WBC 5.4 05/08/2018   HGB 12.3 05/08/2018   HCT 36.4 05/08/2018   MCV 90.1 05/08/2018   PLT 201 05/08/2018   NEUTROABS 3.9 01/31/2018      Assessment Plan:  Malignant neoplasm of upper-inner quadrant of left breast in female, estrogen receptor positive ([place]) 08/19/2017:Left lumpectomy: IDC grade 2, 2.5 cm, DCIS intermediate grade, lymphovascular invasion identified, margins negative, 1/2 lymph nodes positive with extracapsular extension, ER 90%, PR 40%, HER-2 negative ratio 1.15, Ki-67 15% T2 N1a stage II a; Mammaprint high risk   Recommendation: 1. Adjuvant chemotherapy with dose dense Adriamycin and Cytoxan x4 followed by Taxol weekly x12 completed 01/31/2018 2. followed by adjuvant radiation therapywhich will be completed 04/09/2018 3.Bilateral salpingo-oophorectomy 05/15/2018 4.Followed by adjuvant antiestrogen therapy;will enroll the patient in Natalee  clinical trial _______________________________________________________________________________________________  Surveillance:Breast MRI August 2020 with sedation (I did not see any contraindications for sedation.)  Current treatment: Anastrozole 1 mg daily started 06/02/2018 discontinued 07/02/2018 due to diffuse muscle aches and pains, switched to letrozole 07/14/2018  Letrozole toxicities: Occasional hot flashes and mild muscle stiffness but significant improvement from anastrozole.  Arthralgias/myalgias: Much improved on letrozole. 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  She was given exercises by physical therapy.  If they do not respond then we will need to refer her to orthopedics. I also instructed her to start taking turmeric daily.  Bone density 07/01/2018: Normal T score 0.1 Patient was previously offered information on ABC and B well clinical trials  Breast MRI 01/01/2019: 6 mm nodule in the left breast surgical scar Ultrasound-guided biopsy will be planned.  If there is no ultrasound correlate then we have to figure out if an MRI guided biopsy is warranted. Follow-up after the biopsy to discuss pathology report.  I discussed the assessment and treatment plan with the patient. The patient was provided an opportunity to ask questions and all were answered. The patient agreed with the plan and demonstrated an understanding of the instructions. The patient was advised to call back or seek an in-person evaluation if the symptoms worsen or if the condition fails to improve as anticipated.   I provided 15 minutes of face-to-face Doximity time during this encounter.    Kasie Leccese  Loyal Gambler, MD 01/02/2019   I, Molly Dorshimer, am acting as scribe for Nicholas Lose, MD.  I have reviewed the above documentation for accuracy and completeness, and I agree with the above.

## 2019-01-01 NOTE — Progress Notes (Signed)
Patient requesting medication for anxiety. Dr Gifford Shave made aware.No orders given at this time. Patient made aware.

## 2019-01-01 NOTE — Anesthesia Postprocedure Evaluation (Signed)
Anesthesia Post Note  Patient: ADAMARIZ GILLOTT  Procedure(s) Performed: MRI WITH ANESTHESIA  breast with and without (Bilateral )     Patient location during evaluation: PACU Anesthesia Type: General Level of consciousness: awake and alert and oriented Pain management: pain level controlled Vital Signs Assessment: post-procedure vital signs reviewed and stable Respiratory status: spontaneous breathing, nonlabored ventilation and respiratory function stable Cardiovascular status: blood pressure returned to baseline Postop Assessment: no apparent nausea or vomiting Anesthetic complications: no    Last Vitals:  Vitals:   01/01/19 1458 01/01/19 1500  BP:  121/75  Pulse: (!) 57 (!) 56  Resp: 16 16  Temp:  36.8 C  SpO2: 100% 100%    Last Pain:  Vitals:   01/01/19 1500  TempSrc:   PainSc: 0-No pain                 Brennan Bailey

## 2019-01-01 NOTE — Addendum Note (Signed)
Addendum  created 01/01/19 1645 by Brennan Bailey, MD   Clinical Note Signed, Intraprocedure Staff edited

## 2019-01-01 NOTE — Transfer of Care (Signed)
Immediate Anesthesia Transfer of Care Note  Patient: [name]  Procedure(s) Performed: MRI WITH ANESTHESIA  breast with and without (Bilateral )  Patient Location: PACU  Anesthesia Type:General  Level of Consciousness: awake, alert  and oriented  Airway & Oxygen Therapy: Patient Spontanous Breathing and Patient connected to nasal cannula oxygen  Post-op Assessment: Report given to RN, Post -op Vital signs reviewed and stable and Patient moving all extremities  Post vital signs: Reviewed and stable  Last Vitals:  Vitals Value Taken Time  BP 126/77 01/01/19 1435  Temp 36.8 C 01/01/19 1435  Pulse 64 01/01/19 1438  Resp 18 01/01/19 1438  SpO2 100 % 01/01/19 1438  Vitals shown include unvalidated device data.  Last Pain:  Vitals:   01/01/19 1435  TempSrc:   PainSc: Asleep      Patients Stated Pain Goal: 3 (85/92/76 [id number])  Complications: No apparent anesthesia complications

## 2019-01-02 ENCOUNTER — Other Ambulatory Visit: Payer: Self-pay

## 2019-01-02 ENCOUNTER — Ambulatory Visit
Admission: RE | Admit: 2019-01-02 | Discharge: 2019-01-02 | Disposition: A | Discharge status: Home / Self Care | Payer: BC Managed Care – PPO | Source: Ambulatory Visit | Attending: Hematology and Oncology | Admitting: Hematology and Oncology

## 2019-01-02 ENCOUNTER — Inpatient Hospital Stay: Payer: BC Managed Care – PPO | Attending: Hematology and Oncology | Admitting: Hematology and Oncology

## 2019-01-02 ENCOUNTER — Encounter (HOSPITAL_COMMUNITY): Payer: Self-pay | Admitting: Radiology

## 2019-01-02 ENCOUNTER — Other Ambulatory Visit: Payer: Self-pay | Admitting: Hematology and Oncology

## 2019-01-02 DIAGNOSIS — C50212 Malignant neoplasm of upper-inner quadrant of left female breast: Secondary | ICD-10-CM

## 2019-01-02 DIAGNOSIS — D242 Benign neoplasm of left breast: Secondary | ICD-10-CM | POA: Diagnosis not present

## 2019-01-02 DIAGNOSIS — Z79811 Long term (current) use of aromatase inhibitors: Secondary | ICD-10-CM

## 2019-01-02 DIAGNOSIS — Z923 Personal history of irradiation: Secondary | ICD-10-CM | POA: Diagnosis not present

## 2019-01-02 DIAGNOSIS — N6321 Unspecified lump in the left breast, upper outer quadrant: Secondary | ICD-10-CM | POA: Diagnosis not present

## 2019-01-02 DIAGNOSIS — Z9221 Personal history of antineoplastic chemotherapy: Secondary | ICD-10-CM | POA: Diagnosis not present

## 2019-01-02 DIAGNOSIS — Z170 Estrogen receptor positive status [ER+]: Secondary | ICD-10-CM

## 2019-01-05 ENCOUNTER — Other Ambulatory Visit: Payer: Self-pay | Admitting: Hematology and Oncology

## 2019-01-05 DIAGNOSIS — Z1231 Encounter for screening mammogram for malignant neoplasm of breast: Secondary | ICD-10-CM

## 2019-01-05 MED ORDER — ALPRAZOLAM 0.25 MG PO TABS: 0.2500 mg | ORAL_TABLET | Freq: Every evening | ORAL | 3 refills | Status: DC | PRN

## 2019-01-05 MED FILL — Lidocaine HCl Local Soln Prefilled Syringe 100 MG/5ML (2%): INTRAMUSCULAR | Qty: 5 | Status: AC

## 2019-01-05 MED FILL — Rocuronium Bromide IV Soln 50 MG/5ML (10 MG/ML): INTRAVENOUS | Qty: 5 | Status: AC

## 2019-01-05 MED FILL — Ondansetron HCl Inj 4 MG/2ML (2 MG/ML): INTRAMUSCULAR | Qty: 2 | Status: AC

## 2019-01-05 MED FILL — Lactated Ringer's Solution: INTRAVENOUS | Qty: 1000 | Status: AC

## 2019-01-05 MED FILL — Phenylephrine-NaCl Pref Syr 0.4 MG/10ML-0.9% (40 MCG/ML): INTRAVENOUS | Qty: 10 | Status: AC

## 2019-01-05 MED FILL — Propofol IV Emul 200 MG/20ML (10 MG/ML): INTRAVENOUS | Qty: 20 | Status: AC

## 2019-01-05 MED FILL — Fentanyl Citrate Preservative Free (PF) Inj 100 MCG/2ML: INTRAMUSCULAR | Qty: 2 | Status: AC

## 2019-01-05 MED FILL — Dexamethasone Sodium Phosphate Inj 4 MG/ML: INTRAMUSCULAR | Qty: 1 | Status: AC

## 2019-01-05 MED FILL — Midazolam HCl Inj 2 MG/2ML (Base Equivalent): INTRAMUSCULAR | Qty: 2 | Status: AC

## 2019-01-05 MED FILL — Sugammadex Sodium IV 200 MG/2ML (Base Equivalent): INTRAVENOUS | Qty: 2 | Status: AC

## 2019-01-05 NOTE — Progress Notes (Signed)
I discussed the pathology report as well as a schedule for the next surveillance checkups.  She will have a breast MRI in February. Her annual mammograms will be in August 2021. Patient will try to take Xanax prior to the MRI for the next procedure.  She was given sedation and she did not like that feeling at all.

## 2019-01-06 MED FILL — White Petrolatum-Mineral Oil Ophth Ointment: OPHTHALMIC | Qty: 3.5 | Status: AC

## 2019-01-12 ENCOUNTER — Encounter (HOSPITAL_COMMUNITY): Payer: Self-pay | Admitting: Radiology

## 2019-01-30 DIAGNOSIS — F4323 Adjustment disorder with mixed anxiety and depressed mood: Secondary | ICD-10-CM | POA: Diagnosis not present

## 2019-02-03 ENCOUNTER — Ambulatory Visit: Payer: BLUE CROSS/BLUE SHIELD | Admitting: Hematology and Oncology

## 2019-02-24 ENCOUNTER — Other Ambulatory Visit: Payer: Self-pay

## 2019-02-24 DIAGNOSIS — G5620 Lesion of ulnar nerve, unspecified upper limb: Secondary | ICD-10-CM

## 2019-03-02 ENCOUNTER — Ambulatory Visit (INDEPENDENT_AMBULATORY_CARE_PROVIDER_SITE_OTHER): Payer: BC Managed Care – PPO | Admitting: Physician Assistant

## 2019-03-02 ENCOUNTER — Encounter: Payer: Self-pay | Admitting: Physician Assistant

## 2019-03-02 VITALS — Ht 66.0 in | Wt 215.0 lb

## 2019-03-02 DIAGNOSIS — R200 Anesthesia of skin: Secondary | ICD-10-CM

## 2019-03-02 DIAGNOSIS — G5692 Unspecified mononeuropathy of left upper limb: Secondary | ICD-10-CM

## 2019-03-02 NOTE — Progress Notes (Signed)
Office Visit Note   Patient: [name]           Date of Birth: 09/15/1980           MRN: [id number] Visit Date: 03/02/2019              Requested by: Sabrina Lose, MD [address] [place],  [place] [id number] PCP: Sabrina Jordan, MD   Assessment & Plan: Visit Diagnoses:  1. Numbness of left hand   2. Neuropathy of hand, left     Plan: Due to patient's radicular pain recommend vitamin B6 100 mg twice daily.  We will also send her for EMG nerve conduction studies rule out carpal tunnel at the left wrist and ulnar nerve entrapment at the left elbow.  She will follow-up with [place] a week after the EMG nerve conduction studies to go over the results and discuss further treatment.  Questions encouraged and answered at length.  Follow-Up Instructions: Return After EMG/NCS.   Orders:  Orders Placed This Encounter  Procedures  . Ambulatory referral to Physical Medicine Rehab   No orders of the defined types were placed in this encounter.     Procedures: No procedures performed   Clinical Data: No additional findings.   Subjective: Chief Complaint  Patient presents with  . Left Hand - Numbness    HPI [name] is a pleasant 38 year old female comes in today with left hand numbness and tingling up to her elbow.  States it mostly involves her ring and pinky finger.  Does not awaken her.  She does have to shake the hand at times so due to the pain in the hand.  She states running makes this worse.  Also she is tried some stretching exercises from physical therapy that seems to aggravate the hand.  States pain is constant.  No injury.  Denies any neck pain.  Patient's numbness tingling in the left extremity began Thanksgiving of last year.  She has a history pertinent for breast cancer for which she underwent radiation and chemotherapy.  Review of Systems  Constitutional: Negative for chills and fever.  Respiratory: Negative for shortness of breath.       Objective: Vital Signs: Ht 5\' 6"  (1.676 m)   Wt 215 lb (97.5 kg)   BMI 34.70 kg/m   Physical Exam Constitutional:      Appearance: She is not ill-appearing or diaphoretic.  Pulmonary:     Effort: Pulmonary effort is normal.  Neurological:     Mental Status: She is alert and oriented to person, place, and time.  Psychiatric:        Mood and Affect: Mood normal.     Ortho Exam Bilateral hands full motor.  Subjective decreased sensation in the fingertips throughout the left hand.  Radial pulses are 2+ and equal symmetric.  No rashes healing ulcerations dystrophic changes nails either hand.  Positive Tinel's at the median nerve left wrist and ulnar nerve left elbow.  Negative Tinel's over the median and ulnar nerve right extremity.  Positive compression test median nerve left wrist.  Positive Phalen's on the left.  Full range of motion cervical spine without pain.  Specialty Comments:  No specialty comments available.  Imaging: No results found.   PMFS History: Patient Active Problem List   Diagnosis Date Noted  . Port-A-Cath in place 09/12/2017  . Genetic testing 08/09/2017  . Family history of breast cancer   . Family history of colon cancer   . Family  history of cancer   . Malignant neoplasm of upper-inner quadrant of left breast in female, estrogen receptor positive ([place]) 07/30/2017  . Previous cesarean section 09/05/2015  . Postpartum care following cesarean delivery with btl (4/8) 09/04/2015  . Symptomatic cholelithiasis 11/20/2011   Past Medical History:  Diagnosis Date  . Anxiety   . Breast cancer, left ([place]) 07/2017  . Family history of breast cancer   . Family history of colon cancer     Family History  Problem Relation Age of Onset  . Diabetes Mother   . Cancer Mother 16       chondrosarcoma  . Breast cancer Mother 32       DCIS vs Atypical Hyperplasia  . Breast cancer Other        Maternal Great Aunt; MGMs sister  . Breast cancer Other         Maternal Great Aunt  . Other Maternal Grandmother        d. from complications of a fall and breaking her hip  . Alcohol abuse Maternal Grandfather   . Colon cancer Paternal Grandmother     Past Surgical History:  Procedure Laterality Date  . BREAST LUMPECTOMY WITH RADIOACTIVE SEED AND SENTINEL LYMPH NODE BIOPSY Left 08/16/2017   Procedure: BREAST LUMPECTOMY WITH RADIOACTIVE SEED AND SENTINEL LYMPH NODE BIOPSY;  Surgeon: Excell Seltzer, MD;  Location: [facility];  Service: General;  Laterality: Left;  . BREAST LUMPECTOMY WITH SENTINEL LYMPH NODE BIOPSY Left 08/16/2017  . BREAST REDUCTION SURGERY Bilateral 08/23/2017   Procedure: LEFT ONCOPLASTIC REDUCTION, RIGHT BREAST REDUCTION;  Surgeon: Irene Limbo, MD;  Location: [facility];  Service: Plastics;  Laterality: Bilateral;  . CESAREAN SECTION  03/15/2012   Procedure: CESAREAN SECTION;  Surgeon: Lovenia Kim, MD;  Location: [facility];  Service: Obstetrics;  Laterality: N/A;  Primary cesarean section with delivery of baby girl at 43. Apgars 4/7.  Marland Kitchen CESAREAN SECTION WITH BILATERAL TUBAL LIGATION Bilateral 09/03/2015   Procedure: REPEAT CESAREAN SECTION WITH BILATERAL TUBAL LIGATION;  Surgeon: Brien Few, MD;  Location: [facility];  Service: Obstetrics;  Laterality: Bilateral;  EDD: 09/10/15  . CHOLECYSTECTOMY  12/07/2011   Procedure: LAPAROSCOPIC CHOLECYSTECTOMY;  Surgeon: Harl Bowie, MD;  Location: [facility];  Service: General;  Laterality: N/A;  . PORTACATH PLACEMENT Right 09/11/2017   Procedure: INSERTION PORT-A-CATH;  Surgeon: Excell Seltzer, MD;  Location: [facility];  Service: General;  Laterality: Right;  . RADIOLOGY WITH ANESTHESIA Bilateral 01/01/2019   Procedure: MRI WITH ANESTHESIA  breast with and without;  Surgeon: Radiologist, Medication, MD;  Location: [facility];  Service: Radiology;  Laterality: Bilateral;  . ROBOTIC ASSISTED SALPINGO OOPHERECTOMY Bilateral 05/13/2018   Procedure: XI ROBOTIC  ASSISTED SALPINGO OOPHORECTOMY;  Surgeon: Brien Few, MD;  Location: [facility];  Service: Gynecology;  Laterality: Bilateral;  Requests 90 min.  . WISDOM TOOTH EXTRACTION  2002   Social History   Occupational History  . Not on file  Tobacco Use  . Smoking status: Former Smoker    Packs/day: 0.00    Quit date: 05/27/2014    Years since quitting: 4.7  . Smokeless tobacco: Never Used  Substance and Sexual Activity  . Alcohol use: Not Currently  . Drug use: Not Currently    Comment: in college marijuana  . Sexual activity: Yes

## 2019-03-06 DIAGNOSIS — F4323 Adjustment disorder with mixed anxiety and depressed mood: Secondary | ICD-10-CM | POA: Diagnosis not present

## 2019-03-13 DIAGNOSIS — F4323 Adjustment disorder with mixed anxiety and depressed mood: Secondary | ICD-10-CM | POA: Diagnosis not present

## 2019-03-25 ENCOUNTER — Encounter: Payer: BC Managed Care – PPO | Admitting: Physical Medicine and Rehabilitation

## 2019-03-27 DIAGNOSIS — F4323 Adjustment disorder with mixed anxiety and depressed mood: Secondary | ICD-10-CM | POA: Diagnosis not present

## 2019-04-01 ENCOUNTER — Ambulatory Visit: Payer: BC Managed Care – PPO | Admitting: Physician Assistant

## 2019-04-03 DIAGNOSIS — F4323 Adjustment disorder with mixed anxiety and depressed mood: Secondary | ICD-10-CM | POA: Diagnosis not present

## 2019-04-08 DIAGNOSIS — F419 Anxiety disorder, unspecified: Secondary | ICD-10-CM | POA: Diagnosis not present

## 2019-04-08 DIAGNOSIS — Z634 Disappearance and death of family member: Secondary | ICD-10-CM | POA: Diagnosis not present

## 2019-04-13 DIAGNOSIS — Z20828 Contact with and (suspected) exposure to other viral communicable diseases: Secondary | ICD-10-CM | POA: Diagnosis not present

## 2019-04-14 ENCOUNTER — Ambulatory Visit (INDEPENDENT_AMBULATORY_CARE_PROVIDER_SITE_OTHER): Payer: BC Managed Care – PPO | Admitting: Physical Medicine and Rehabilitation

## 2019-04-14 ENCOUNTER — Other Ambulatory Visit: Payer: Self-pay

## 2019-04-14 ENCOUNTER — Encounter: Payer: Self-pay | Admitting: Physical Medicine and Rehabilitation

## 2019-04-14 DIAGNOSIS — R202 Paresthesia of skin: Secondary | ICD-10-CM

## 2019-04-14 NOTE — Progress Notes (Signed)
Unfortunately, when doing the chart reviewed today prior to the test it appeared that the patient had had a coronavirus or Covid test performed yesterday at [facility].  She is still awaiting results from that and this was done because of potential exposure.  We asked her to reschedule the test at that point and she should self quarantine until those results are back.  She should seek care with a primary care physician depending on symptoms or positive nature of the test.  We did reschedule her for the nerve test for later date.   Numbness in left arm that started in fourth and fifth fingers and has now moved to all fingers. Still worse in fourth and fifth. Worse with exercise, typing, or lying on left arm at night. Right hand dominant.  Follow up 11/23. Numeric Pain Rating Scale and Functional Assessment Average Pain 4   In the last MONTH (on 0-10 scale) has pain interfered with the following?  1. General activity like being  able to carry out your everyday physical activities such as walking, climbing stairs, carrying groceries, or moving a chair?  Rating(3)

## 2019-04-15 DIAGNOSIS — Z634 Disappearance and death of family member: Secondary | ICD-10-CM | POA: Diagnosis not present

## 2019-04-15 DIAGNOSIS — F419 Anxiety disorder, unspecified: Secondary | ICD-10-CM | POA: Diagnosis not present

## 2019-04-17 DIAGNOSIS — F4323 Adjustment disorder with mixed anxiety and depressed mood: Secondary | ICD-10-CM | POA: Diagnosis not present

## 2019-04-20 ENCOUNTER — Ambulatory Visit: Payer: BC Managed Care – PPO | Admitting: Physician Assistant

## 2019-04-21 DIAGNOSIS — Z634 Disappearance and death of family member: Secondary | ICD-10-CM | POA: Diagnosis not present

## 2019-04-21 DIAGNOSIS — F419 Anxiety disorder, unspecified: Secondary | ICD-10-CM | POA: Diagnosis not present

## 2019-04-27 ENCOUNTER — Other Ambulatory Visit: Payer: Self-pay

## 2019-04-27 ENCOUNTER — Encounter: Payer: Self-pay | Admitting: Physical Medicine and Rehabilitation

## 2019-04-27 ENCOUNTER — Ambulatory Visit (INDEPENDENT_AMBULATORY_CARE_PROVIDER_SITE_OTHER): Payer: BC Managed Care – PPO | Admitting: Physical Medicine and Rehabilitation

## 2019-04-27 DIAGNOSIS — R202 Paresthesia of skin: Secondary | ICD-10-CM

## 2019-04-27 NOTE — Progress Notes (Signed)
   Numeric Pain Rating Scale and Functional Assessment Average Pain 4   In the last MONTH (on 0-10 scale) has pain interfered with the following?  1. General activity like being  able to carry out your everyday physical activities such as walking, climbing stairs, carrying groceries, or moving a chair?  Rating(3)

## 2019-04-28 NOTE — Progress Notes (Signed)
[name] - 38 y.o. female MRN [id number]  Date of birth: [birth year]  Office Visit Note: Visit Date: 04/27/2019 PCP: Jonathon Jordan, MD Referred by: Jonathon Jordan, MD  Subjective: Chief Complaint  Patient presents with  . Left Hand - Numbness   HPI:  [name] is a 38 y.o. female who comes in today At the request of Benita Stabile, P.A.-C for electrodiagnostic study of the left upper limb.  Patient complains of chronic worsening numbness in the left arm that started really in the fourth and fifth digits.  She is right-hand dominant.  Fifth digit has seem to be the worst all along.  She says now has really moved into all of the digits and points more to the radial side.  She denies any right-sided complaints.  She reports is worse with exercise typing or lying on the left arm at night.  She rates her pain as a 4 out of 10.  She has not had prior electrodiagnostic study.  She denies any frank radicular pain.  Just a note patient did get a vasovagal response with the needle EMG portion of the exam.  I was able to examine the FDI and pronator teres muscle which effectively ruled out concomitant radiculopathy.  ROS Otherwise per HPI.  Assessment & Plan: Visit Diagnoses:  1. Paresthesia of skin     Plan: Impression: The above electrodiagnostic study is ABNORMAL and reveals evidence of a moderate left median nerve entrapment at the wrist (carpal tunnel syndrome) affecting sensory and motor components.   There is no significant electrodiagnostic evidence of any other focal nerve entrapment (Particularly ulnar), brachial plexopathy or cervical radiculopathy.   Recommendations: 1.  Follow-up with referring physician. 2.  Continue current management of symptoms. 3.  Continue use of resting splint at night-time and as needed during the day. 4.  Suggest diagnostic injection and surgical evaluation.  Meds & Orders: No orders of the defined types were placed in this encounter.    Orders Placed This Encounter  Procedures  . NCV with EMG (electromyography)    Follow-up: Return for Benita Stabile, P.A.-C.   Procedures: No procedures performed  EMG & NCV Findings: Evaluation of the left median motor nerve showed prolonged distal onset latency (5.9 ms) and decreased conduction velocity (Elbow-Wrist, 45 m/s).  The left median (across palm) sensory nerve showed prolonged distal peak latency (5.1 ms).  All remaining nerves (as indicated in the following tables) were within normal limits.    All examined muscles (as indicated in the following table) showed no evidence of electrical instability.    Impression: The above electrodiagnostic study is ABNORMAL and reveals evidence of a moderate left median nerve entrapment at the wrist (carpal tunnel syndrome) affecting sensory and motor components.   There is no significant electrodiagnostic evidence of any other focal nerve entrapment (Particularly ulnar), brachial plexopathy or cervical radiculopathy.   Recommendations: 1.  Follow-up with referring physician. 2.  Continue current management of symptoms. 3.  Continue use of resting splint at night-time and as needed during the day. 4.  Suggest diagnostic injection and surgical evaluation.  ___________________________ Laurence Spates FAAPMR Board Certified, American Board of Physical Medicine and Rehabilitation    Nerve Conduction Studies Anti Sensory Summary Table   Stim Site NR Peak (ms) Norm Peak (ms) P-T Amp (V) Norm P-T Amp Site1 Site2 Delta-P (ms) Dist (cm) Vel (m/s) Norm Vel (m/s)  Left Median Acr Palm Anti Sensory (2nd Digit)  30C  Wrist    *  5.1 <3.6 13.2 >10        Site 3    2.1  15.5         Left Radial Anti Sensory (Base 1st Digit)  30.7C  Wrist    2.1 <3.1 24.1  Wrist Base 1st Digit 2.1 0.0    Left Ulnar Anti Sensory (5th Digit)  30.8C  Wrist    3.2 <3.7 22.8 >15.0 Wrist 5th Digit 3.2 14.0 44 >38   Motor Summary Table   Stim Site NR Onset (ms) Norm Onset  (ms) O-P Amp (mV) Norm O-P Amp Site1 Site2 Delta-0 (ms) Dist (cm) Vel (m/s) Norm Vel (m/s)  Left Median Motor (Abd Poll Brev)  31C  Wrist    *5.9 <4.2 5.2 >5 Elbow Wrist 4.2 19.0 *45 >50  Elbow    10.1  5.0         Left Ulnar Motor (Abd Dig Min)  31.3C  Wrist    2.9 <4.2 6.7 >3 B Elbow Wrist 3.0 19.0 63 >53  B Elbow    5.9  6.4  A Elbow B Elbow 1.2 10.0 83 >53  A Elbow    7.1  7.5          EMG   Side Muscle Nerve Root Ins Act Fibs Psw Amp Dur Poly Recrt Int Fraser Din Comment  Left Abd Poll Brev Median C8-T1 Nml Nml Nml Nml Nml 0 Nml Nml   Left 1stDorInt Ulnar C8-T1 Nml Nml Nml Nml Nml 0 Nml Nml   Left PronatorTeres Median C6-7 Nml Nml Nml Nml Nml 0 Nml Nml   Left Biceps Musculocut C5-6 Nml Nml Nml Nml Nml 0 Nml Nml   Left Deltoid Axillary C5-6 Nml Nml Nml Nml Nml 0 Nml Nml     Nerve Conduction Studies Anti Sensory Left/Right Comparison   Stim Site L Lat (ms) R Lat (ms) L-R Lat (ms) L Amp (V) R Amp (V) L-R Amp (%) Site1 Site2 L Vel (m/s) R Vel (m/s) L-R Vel (m/s)  Median Acr Palm Anti Sensory (2nd Digit)  30C  Wrist *5.1   13.2         Site 3 2.1   15.5         Radial Anti Sensory (Base 1st Digit)  30.7C  Wrist 2.1   24.1   Wrist Base 1st Digit     Ulnar Anti Sensory (5th Digit)  30.8C  Wrist 3.2   22.8   Wrist 5th Digit 44     Motor Left/Right Comparison   Stim Site L Lat (ms) R Lat (ms) L-R Lat (ms) L Amp (mV) R Amp (mV) L-R Amp (%) Site1 Site2 L Vel (m/s) R Vel (m/s) L-R Vel (m/s)  Median Motor (Abd Poll Brev)  31C  Wrist *5.9   5.2   Elbow Wrist *45    Elbow 10.1   5.0         Ulnar Motor (Abd Dig Min)  31.3C  Wrist 2.9   6.7   B Elbow Wrist 63    B Elbow 5.9   6.4   A Elbow B Elbow 83    A Elbow 7.1   7.5            Waveforms:            Clinical History: No specialty comments available.     Objective:  VS:  HT:    WT:   BMI:     BP:   HR: bpm  TEMP: ( )  RESP:  Physical Exam Musculoskeletal:        General: No swelling, tenderness or deformity.      Comments: Inspection reveals no atrophy of the bilateral APB or FDI or hand intrinsics. There is no swelling, color changes, allodynia or dystrophic changes. There is 5 out of 5 strength in the bilateral wrist extension, finger abduction and long finger flexion. There is intact sensation to light touch in all dermatomal and peripheral nerve distributions.  There is a negative Hoffmann's test bilaterally.  Skin:    General: Skin is warm and dry.     Findings: No erythema or rash.  Neurological:     General: No focal deficit present.     Mental Status: She is alert and oriented to person, place, and time.     Motor: No weakness or abnormal muscle tone.     Coordination: Coordination normal.  Psychiatric:        Mood and Affect: Mood normal.        Behavior: Behavior normal.     Ortho Exam Imaging: No results found.

## 2019-04-28 NOTE — Procedures (Signed)
EMG & NCV Findings: Evaluation of the left median motor nerve showed prolonged distal onset latency (5.9 ms) and decreased conduction velocity (Elbow-Wrist, 45 m/s).  The left median (across palm) sensory nerve showed prolonged distal peak latency (5.1 ms).  All remaining nerves (as indicated in the following tables) were within normal limits.    All examined muscles (as indicated in the following table) showed no evidence of electrical instability.    Impression: The above electrodiagnostic study is ABNORMAL and reveals evidence of a moderate left median nerve entrapment at the wrist (carpal tunnel syndrome) affecting sensory and motor components.   There is no significant electrodiagnostic evidence of any other focal nerve entrapment (Particularly ulnar), brachial plexopathy or cervical radiculopathy.   Recommendations: 1.  Follow-up with referring physician. 2.  Continue current management of symptoms. 3.  Continue use of resting splint at night-time and as needed during the day. 4.  Suggest diagnostic injection and surgical evaluation.  ___________________________ Laurence Spates FAAPMR Board Certified, American Board of Physical Medicine and Rehabilitation    Nerve Conduction Studies Anti Sensory Summary Table   Stim Site NR Peak (ms) Norm Peak (ms) P-T Amp (V) Norm P-T Amp Site1 Site2 Delta-P (ms) Dist (cm) Vel (m/s) Norm Vel (m/s)  Left Median Acr Palm Anti Sensory (2nd Digit)  30C  Wrist    *5.1 <3.6 13.2 >10        Site 3    2.1  15.5         Left Radial Anti Sensory (Base 1st Digit)  30.7C  Wrist    2.1 <3.1 24.1  Wrist Base 1st Digit 2.1 0.0    Left Ulnar Anti Sensory (5th Digit)  30.8C  Wrist    3.2 <3.7 22.8 >15.0 Wrist 5th Digit 3.2 14.0 44 >38   Motor Summary Table   Stim Site NR Onset (ms) Norm Onset (ms) O-P Amp (mV) Norm O-P Amp Site1 Site2 Delta-0 (ms) Dist (cm) Vel (m/s) Norm Vel (m/s)  Left Median Motor (Abd Poll Brev)  31C  Wrist    *5.9 <4.2 5.2 >5 Elbow  Wrist 4.2 19.0 *45 >50  Elbow    10.1  5.0         Left Ulnar Motor (Abd Dig Min)  31.3C  Wrist    2.9 <4.2 6.7 >3 B Elbow Wrist 3.0 19.0 63 >53  B Elbow    5.9  6.4  A Elbow B Elbow 1.2 10.0 83 >53  A Elbow    7.1  7.5          EMG   Side Muscle Nerve Root Ins Act Fibs Psw Amp Dur Poly Recrt Int Fraser Din Comment  Left Abd Poll Brev Median C8-T1 Nml Nml Nml Nml Nml 0 Nml Nml   Left 1stDorInt Ulnar C8-T1 Nml Nml Nml Nml Nml 0 Nml Nml   Left PronatorTeres Median C6-7 Nml Nml Nml Nml Nml 0 Nml Nml   Left Biceps Musculocut C5-6 Nml Nml Nml Nml Nml 0 Nml Nml   Left Deltoid Axillary C5-6 Nml Nml Nml Nml Nml 0 Nml Nml     Nerve Conduction Studies Anti Sensory Left/Right Comparison   Stim Site L Lat (ms) R Lat (ms) L-R Lat (ms) L Amp (V) R Amp (V) L-R Amp (%) Site1 Site2 L Vel (m/s) R Vel (m/s) L-R Vel (m/s)  Median Acr Palm Anti Sensory (2nd Digit)  30C  Wrist *5.1   13.2         Site 3  2.1   15.5         Radial Anti Sensory (Base 1st Digit)  30.7C  Wrist 2.1   24.1   Wrist Base 1st Digit     Ulnar Anti Sensory (5th Digit)  30.8C  Wrist 3.2   22.8   Wrist 5th Digit 44     Motor Left/Right Comparison   Stim Site L Lat (ms) R Lat (ms) L-R Lat (ms) L Amp (mV) R Amp (mV) L-R Amp (%) Site1 Site2 L Vel (m/s) R Vel (m/s) L-R Vel (m/s)  Median Motor (Abd Poll Brev)  31C  Wrist *5.9   5.2   Elbow Wrist *45    Elbow 10.1   5.0         Ulnar Motor (Abd Dig Min)  31.3C  Wrist 2.9   6.7   B Elbow Wrist 63    B Elbow 5.9   6.4   A Elbow B Elbow 83    A Elbow 7.1   7.5            Waveforms:

## 2019-05-04 ENCOUNTER — Encounter: Payer: Self-pay | Admitting: Physician Assistant

## 2019-05-04 ENCOUNTER — Ambulatory Visit (INDEPENDENT_AMBULATORY_CARE_PROVIDER_SITE_OTHER): Payer: BC Managed Care – PPO | Admitting: Physician Assistant

## 2019-05-04 ENCOUNTER — Other Ambulatory Visit: Payer: Self-pay

## 2019-05-04 DIAGNOSIS — G5602 Carpal tunnel syndrome, left upper limb: Secondary | ICD-10-CM | POA: Diagnosis not present

## 2019-05-04 NOTE — Progress Notes (Signed)
Office Visit Note   Patient: [name]           Date of Birth: 1981/03/14           MRN: [id number] Visit Date: 05/04/2019              Requested by: Jonathon Jordan, MD [address] [place],  [place] [id number] PCP: Jonathon Jordan, MD   Assessment & Plan: Visit Diagnoses:  1. Carpal tunnel syndrome, left upper limb     Plan:  Disc asked with her conservative measures such as splinting, cortisone injections and time.  Also discussed carpal tunnel release surgery with her.  She would like to proceed with carpal tunnel surgery in the near future.  Risk benefits of surgery discussed postop protocol reviewed with the patient.  We will see her back 2 weeks postop.  Questions were encouraged and answered by Dr. Ninfa Linden and myself.  Follow-Up Instructions: Return 2 weeks post-op.   Orders:  No orders of the defined types were placed in this encounter.  No orders of the defined types were placed in this encounter.     Procedures: No procedures performed   Clinical Data: No additional findings.   Subjective: Chief Complaint  Patient presents with  . Left Hand - Follow-up    HPI When he returns today to go over the nerve conduction studies left upper extremity.  This showed moderate median nerve entrapment carpal tunnel syndrome.  There was no ulnar findings.  She still has some tingling in the entire hand feels that it does involve the left fifth finger.  Symptoms remain unchanged. Review of Systems See HPI  Objective: Vital Signs: There were no vitals taken for this visit.  Physical Exam Constitutional:      Appearance: She is not ill-appearing or diaphoretic.  Pulmonary:     Effort: Pulmonary effort is normal.  Neurological:     Mental Status: She is alert and oriented to person, place, and time.     Ortho Exam  Specialty Comments:  No specialty comments available.  Imaging: No results found.   PMFS History: Patient Active  Problem List   Diagnosis Date Noted  . Port-A-Cath in place 09/12/2017  . Genetic testing 08/09/2017  . Family history of breast cancer   . Family history of colon cancer   . Family history of cancer   . Malignant neoplasm of upper-inner quadrant of left breast in female, estrogen receptor positive ([facility]) 07/30/2017  . Previous cesarean section 09/05/2015  . Postpartum care following cesarean delivery with btl (4/8) 09/04/2015  . Symptomatic cholelithiasis 11/20/2011   Past Medical History:  Diagnosis Date  . Anxiety   . Breast cancer, left ([facility]) 07/2017  . Family history of breast cancer   . Family history of colon cancer     Family History  Problem Relation Age of Onset  . Diabetes Mother   . Cancer Mother 4       chondrosarcoma  . Breast cancer Mother 45       DCIS vs Atypical Hyperplasia  . Breast cancer Other        Maternal Great Aunt; MGMs sister  . Breast cancer Other        Maternal Great Aunt  . Other Maternal Grandmother        d. from complications of a fall and breaking her hip  . Alcohol abuse Maternal Grandfather   . Colon cancer Paternal Grandmother     Past  Surgical History:  Procedure Laterality Date  . BREAST LUMPECTOMY WITH RADIOACTIVE SEED AND SENTINEL LYMPH NODE BIOPSY Left 08/16/2017   Procedure: BREAST LUMPECTOMY WITH RADIOACTIVE SEED AND SENTINEL LYMPH NODE BIOPSY;  Surgeon: Excell Seltzer, MD;  Location: [facility];  Service: General;  Laterality: Left;  . BREAST LUMPECTOMY WITH SENTINEL LYMPH NODE BIOPSY Left 08/16/2017  . BREAST REDUCTION SURGERY Bilateral 08/23/2017   Procedure: LEFT ONCOPLASTIC REDUCTION, RIGHT BREAST REDUCTION;  Surgeon: Irene Limbo, MD;  Location: [facility];  Service: Plastics;  Laterality: Bilateral;  . CESAREAN SECTION  03/15/2012   Procedure: CESAREAN SECTION;  Surgeon: Lovenia Kim, MD;  Location: [facility];  Service: Obstetrics;  Laterality: N/A;  Primary cesarean section with  delivery of baby girl at 30. Apgars 4/7.  Marland Kitchen CESAREAN SECTION WITH BILATERAL TUBAL LIGATION Bilateral 09/03/2015   Procedure: REPEAT CESAREAN SECTION WITH BILATERAL TUBAL LIGATION;  Surgeon: Brien Few, MD;  Location: [facility];  Service: Obstetrics;  Laterality: Bilateral;  EDD: 09/10/15  . CHOLECYSTECTOMY  12/07/2011   Procedure: LAPAROSCOPIC CHOLECYSTECTOMY;  Surgeon: Harl Bowie, MD;  Location: [facility];  Service: General;  Laterality: N/A;  . PORTACATH PLACEMENT Right 09/11/2017   Procedure: INSERTION PORT-A-CATH;  Surgeon: Excell Seltzer, MD;  Location: [facility];  Service: General;  Laterality: Right;  . RADIOLOGY WITH ANESTHESIA Bilateral 01/01/2019   Procedure: MRI WITH ANESTHESIA  breast with and without;  Surgeon: Radiologist, Medication, MD;  Location: [facility];  Service: Radiology;  Laterality: Bilateral;  . ROBOTIC ASSISTED SALPINGO OOPHERECTOMY Bilateral 05/13/2018   Procedure: XI ROBOTIC ASSISTED SALPINGO OOPHORECTOMY;  Surgeon: Brien Few, MD;  Location: [facility];  Service: Gynecology;  Laterality: Bilateral;  Requests 90 min.  . WISDOM TOOTH EXTRACTION  2002   Social History   Occupational History  . Not on file  Tobacco Use  . Smoking status: Former Smoker    Packs/day: 0.00    Quit date: 05/27/2014    Years since quitting: 4.9  . Smokeless tobacco: Never Used  Substance and Sexual Activity  . Alcohol use: Not Currently  . Drug use: Not Currently    Comment: in college marijuana  . Sexual activity: Yes

## 2019-05-06 DIAGNOSIS — Z634 Disappearance and death of family member: Secondary | ICD-10-CM | POA: Diagnosis not present

## 2019-05-06 DIAGNOSIS — F411 Generalized anxiety disorder: Secondary | ICD-10-CM | POA: Diagnosis not present

## 2019-05-06 DIAGNOSIS — F4323 Adjustment disorder with mixed anxiety and depressed mood: Secondary | ICD-10-CM | POA: Diagnosis not present

## 2019-05-08 DIAGNOSIS — F4323 Adjustment disorder with mixed anxiety and depressed mood: Secondary | ICD-10-CM | POA: Diagnosis not present

## 2019-05-26 DIAGNOSIS — F411 Generalized anxiety disorder: Secondary | ICD-10-CM | POA: Diagnosis not present

## 2019-05-26 DIAGNOSIS — F4323 Adjustment disorder with mixed anxiety and depressed mood: Secondary | ICD-10-CM | POA: Diagnosis not present

## 2019-05-26 DIAGNOSIS — Z634 Disappearance and death of family member: Secondary | ICD-10-CM | POA: Diagnosis not present

## 2019-06-04 ENCOUNTER — Other Ambulatory Visit: Payer: Self-pay | Admitting: Orthopaedic Surgery

## 2019-06-04 ENCOUNTER — Encounter: Payer: Self-pay | Admitting: Orthopaedic Surgery

## 2019-06-04 DIAGNOSIS — G5602 Carpal tunnel syndrome, left upper limb: Secondary | ICD-10-CM | POA: Diagnosis not present

## 2019-06-04 MED ORDER — HYDROCODONE-ACETAMINOPHEN 5-325 MG PO TABS: 1.0000 | ORAL_TABLET | Freq: Four times a day (QID) | ORAL | 0 refills | Status: DC | PRN

## 2019-06-05 DIAGNOSIS — F4323 Adjustment disorder with mixed anxiety and depressed mood: Secondary | ICD-10-CM | POA: Diagnosis not present

## 2019-06-08 ENCOUNTER — Telehealth: Payer: Self-pay

## 2019-06-08 NOTE — Telephone Encounter (Signed)
She can remove the dressings on her operative hand on Wednesday of this week.  She can then get her incision wet in the shower daily.  She can place a small layer of Vaseline or Neosporin only incision daily if she feels like she needs it and she should definitely place large Band-Aids over the incisions daily to protect the sutures.  She can use her hand as comfort allows.

## 2019-06-08 NOTE — Telephone Encounter (Signed)
Patient aware of the below message  

## 2019-06-08 NOTE — Telephone Encounter (Signed)
Patient called wanting to know about after care instructions for her Left hand.  Stated that she had carpal tunnel surgery on Thursday, 06/04/2019.  Cb# is [phone].  Please advise.  Thank you.

## 2019-06-15 ENCOUNTER — Telehealth: Payer: Self-pay | Admitting: *Deleted

## 2019-06-15 NOTE — Telephone Encounter (Signed)
06/15/2019 Research - [facility] [phone] ABC  Left voice mail message for patient today regarding upcoming February visit and planned contact for the [facility] [id number] [facility] research study. Explained to patient that the study actually closed last month as all slots were filled and enrollment completed. Thanked patient for her willingness to participate in this trial and noted that we would keep her in mind for any future studies that she may qualify for. Cindy S. Brigitte Pulse BSN, RN, CCRP 06/15/2019 10:18 AM

## 2019-06-18 ENCOUNTER — Ambulatory Visit (INDEPENDENT_AMBULATORY_CARE_PROVIDER_SITE_OTHER): Payer: BC Managed Care – PPO | Admitting: Orthopaedic Surgery

## 2019-06-18 ENCOUNTER — Other Ambulatory Visit: Payer: Self-pay

## 2019-06-18 ENCOUNTER — Encounter: Payer: Self-pay | Admitting: Orthopaedic Surgery

## 2019-06-18 DIAGNOSIS — Z9889 Other specified postprocedural states: Secondary | ICD-10-CM

## 2019-06-18 NOTE — Progress Notes (Signed)
HPI: [name] returns today 14 days status post left carpal tunnel release.  She is overall doing okay.  States she has some soreness still in the hand and that her numbness really has not resolved as of yet.  No fevers or chills.  Physical exam: Left hand subjective decreased sensation throughout the median nerve distribution the tips of the fingers.  Radial pulses intact.  Surgical incisions well approximated with interrupted sutures.  No drainage.  Hand incision appears to not be completely healed at this point time therefore sutures were left in.  Impression: Status post left carpal tunnel release 06/04/2019  Plan: She will work on scar tissue mobilization.  She can get the hand wet for hygiene purposes.  She will follow-up with [place] next Wednesday at that time we will remove her sutures.  Questions were encouraged and answered at length.

## 2019-06-24 ENCOUNTER — Encounter: Payer: Self-pay | Admitting: Physician Assistant

## 2019-06-24 ENCOUNTER — Ambulatory Visit (INDEPENDENT_AMBULATORY_CARE_PROVIDER_SITE_OTHER): Payer: BC Managed Care – PPO | Admitting: Physician Assistant

## 2019-06-24 ENCOUNTER — Other Ambulatory Visit: Payer: Self-pay

## 2019-06-24 DIAGNOSIS — Z9889 Other specified postprocedural states: Secondary | ICD-10-CM

## 2019-06-24 NOTE — Progress Notes (Signed)
HPI: [name] returns today for for suture removal from the left hand.  She states numbness tingling is better.  However she is having pain along the suture line.  She said no fevers chills or drainage.  Physical exam: Left hand surgical incisions well approximated with interrupted nylon sutures.  No signs of gross infection.  Full motor of the left hand.  Impression: Status post left carpal tunnel release 3 weeks postop.  Plan: Sutures removed Steri-Strips applied.  She is able to get the incision wet for hygiene purposes.  No heavy lifting no submerging the hand.  She is to work on scar tissue mobile lesion on desensitizing the hand.  We will see her back in 1 month check her progress.  Sooner if there is any questions concerns.

## 2019-06-26 DIAGNOSIS — F4323 Adjustment disorder with mixed anxiety and depressed mood: Secondary | ICD-10-CM | POA: Diagnosis not present

## 2019-07-07 ENCOUNTER — Ambulatory Visit: Payer: BC Managed Care – PPO | Admitting: Hematology and Oncology

## 2019-07-10 DIAGNOSIS — F4323 Adjustment disorder with mixed anxiety and depressed mood: Secondary | ICD-10-CM | POA: Diagnosis not present

## 2019-07-12 ENCOUNTER — Other Ambulatory Visit: Payer: Self-pay | Admitting: Hematology and Oncology

## 2019-07-13 NOTE — Progress Notes (Signed)
Patient Care Team: Jonathon Jordan, MD as PCP - General (Family Medicine) Nicholas Lose, MD as Consulting Physician (Hematology and Oncology) Kyung Rudd, MD as Consulting Physician (Radiation Oncology) Excell Seltzer, MD (Inactive) as Consulting Physician (General Surgery)  DIAGNOSIS:    ICD-10-CM   1. Malignant neoplasm of upper-inner quadrant of left breast in female, estrogen receptor positive ([place])  C50.212    Z17.0     SUMMARY OF ONCOLOGIC HISTORY: Oncology History  Malignant neoplasm of upper-inner quadrant of left breast in female, estrogen receptor positive ([place])  07/24/2017 Initial Diagnosis   Left breast palpable lump upper inner quadrant 11 o'clock position: Ill-defined 1.6 cm mass, axilla ultrasound negative, ultrasound-guided biopsy: Grade 2 IDC ER 90%, PR 40%, Ki-67 15%, HER-2 negative   08/09/2017 Genetic Testing   Negative genetic testing common hereditary cancer panel.  The Hereditary Gene Panel offered by [facility] includes sequencing and/or deletion duplication testing of the following 47 genes: APC, ATM, AXIN2, BARD1, BMPR1A, BRCA1, BRCA2, BRIP1, CDH1, CDK4, CDKN2A (p14ARF), CDKN2A (p16INK4a), CHEK2, CTNNA1, DICER1, EPCAM (Deletion/duplication testing only), GREM1 (promoter region deletion/duplication testing only), KIT, MEN1, MLH1, MSH2, MSH3, MSH6, MUTYH, NBN, NF1, NHTL1, PALB2, PDGFRA, PMS2, POLD1, POLE, PTEN, RAD50, RAD51C, RAD51D, SDHB, SDHC, SDHD, SMAD4, SMARCA4. STK11, TP53, TSC1, TSC2, and VHL.  The following genes were evaluated for sequence changes only: SDHA and HOXB13 c.251G>A variant only. The report date is August 07, 2017.   08/16/2017 Surgery   Left lumpectomy: IDC grade 2, 2.5 cm, DCIS intermediate grade, lymphovascular invasion identified, margins negative, 1/2 lymph nodes positive with extracapsular extension, ER 90%, PR 40%, HER-2 negative ratio 1.15, Ki-67 15% T2 N1a stage II a; Mammaprint high risk    08/16/2017 Miscellaneous   MammaPrint 08/16/17   High risk with average 10-year Risk of recurrence Untreated: 29% MPI: -0.399 94.6% benefit of chemotherapy and Hormonal therapy at 5 years from distant recurrence.    09/12/2017 - 01/31/2018 Chemotherapy   Adjuvant chemotherapy with dose dense Adriamycin and Cytoxan x4 followed by Taxol weekly x12   02/24/2018 - 04/09/2018 Radiation Therapy   Radiation therapy with Dr. Lisbeth Renshaw 02/24/18-04/09/18   05/13/2018 Surgery   Bilateral salpingo-oophorectomy with lysis of left adnexal adhesions   05/28/2018 -  Anti-estrogen oral therapy   Anastrozole: Discontinued due to joint pains and stiffness, switched to letrozole 07/14/2018     CHIEF COMPLIANT: Follow-up of left breast cancer on letrozole  INTERVAL HISTORY: [name] is a 39 y.o. with above-mentioned history of  left breast cancer treated with lumpectomy,adjuvant chemotherapy,radiation and bilateral salpingo-oophorectomy. She is currently on anti-estrogen therapy with letrozole. She presents to the clinic today for follow-up.  She had a breast MRI done earlier today and is here to discuss results.  The MRI showed an indeterminate lesion in the left breast measuring 0.9 cm.  She does me that she is tolerating the letrozole fairly well.  She continues to feel moderately fatigued and has mild achiness but nowhere comparable to anastrozole therapy.  ALLERGIES:  has No Known Allergies.  MEDICATIONS:  Current Outpatient Medications  Medication Sig Dispense Refill  . ALPRAZolam (XANAX) 0.25 MG tablet Take 1 tablet (0.25 mg total) by mouth at bedtime as needed for anxiety. 30 tablet 3  . escitalopram (LEXAPRO) 20 MG tablet Take 1 tablet (20 mg total) by mouth daily with supper. 90 tablet 3  . HYDROcodone-acetaminophen (NORCO/VICODIN) 5-325 MG tablet Take 1-2 tablets by mouth every 6 (six) hours as needed for moderate pain. 40 tablet 0  . letrozole (  FEMARA) 2.5 MG tablet TAKE 1 TABLET BY MOUTH EVERY DAY 90 tablet 3   No current  facility-administered medications for this visit.   Facility-Administered Medications Ordered in Other Visits  Medication Dose Route Frequency Provider Last Rate Last Admin  . heparin lock flush 100 unit/mL  500 Units Intracatheter Once PRN Nicholas Lose, MD      . sodium chloride flush (NS) 0.9 % injection 10 mL  10 mL Intracatheter PRN Nicholas Lose, MD   10 mL at 11/15/17 1449  . sodium chloride flush (NS) 0.9 % injection 10 mL  10 mL Intracatheter PRN Nicholas Lose, MD      . sodium chloride flush (NS) 0.9 % injection 10 mL  10 mL Intracatheter PRN Nicholas Lose, MD   10 mL at 12/13/17 1500    PHYSICAL EXAMINATION: ECOG PERFORMANCE STATUS: 1 - Symptomatic but completely ambulatory  Vitals:   07/14/19 1457  BP: 126/87  Pulse: 98  Resp: 17  Temp: 97.9 F (36.6 C)  SpO2: 98%   Filed Weights   07/14/19 1457  Weight: 251 lb (113.9 kg)    LABORATORY DATA:  I have reviewed the data as listed CMP Latest Ref Rng & Units 01/01/2019 01/31/2018 01/24/2018  Glucose 70 - 99 mg/dL 92 141(H) 161(H)  BUN 6 - 20 mg/dL _0 Creatinine 0.44 - 1.00 mg/dL 0.72 0.83 0.83  Sodium 135 - 145 mmol/L 139 140 141  Potassium 3.5 - 5.1 mmol/L 3.8 3.9 3.9  Chloride 98 - 111 mmol/L 107 106 106  CO2 22 - 32 mmol/L _1 Calcium 8.9 - 10.3 mg/dL 9.0 8.8(L) 8.9  Total Protein 6.5 - 8.1 g/dL - 6.4(L) 6.3(L)  Total Bilirubin 0.3 - 1.2 mg/dL - 0.5 0.4  Alkaline Phos 38 - 126 U/L - 76 72  AST 15 - 41 U/L - 32 33  ALT 0 - 44 U/L - 45(H) 51(H)    Lab Results  Component Value Date   WBC 5.4 05/08/2018   HGB 12.3 05/08/2018   HCT 36.4 05/08/2018   MCV 90.1 05/08/2018   PLT 201 05/08/2018   NEUTROABS 3.9 01/31/2018    ASSESSMENT & PLAN:  Malignant neoplasm of upper-inner quadrant of left breast in female, estrogen receptor positive ([place]) 08/19/2017:Left lumpectomy: IDC grade 2, 2.5 cm, DCIS intermediate grade, lymphovascular invasion identified, margins negative, 1/2 lymph nodes positive with  extracapsular extension, ER 90%, PR 40%, HER-2 negative ratio 1.15, Ki-67 15% T2 N1a stage II a; Mammaprint high risk   Recommendation: 1. Adjuvant chemotherapy with dose dense Adriamycin and Cytoxan x4 followed by Taxol weekly x12 completed 01/31/2018 2. followed by adjuvant radiation therapywhich will be completed 04/09/2018 3.Bilateral salpingo-oophorectomy 05/15/2018 4.Followed by adjuvant antiestrogen therapy;will enroll the patient in [facility] clinical trial _______________________________________________________________________________________________  Surveillance:Breast MRI August 2020 with sedation(I did not see any contraindications for sedation.)  Current treatment: Anastrozole 1 mg daily started 06/02/2018 discontinued 07/02/2018 due to diffuse muscle aches and pains,switched to letrozole 07/14/2018  Letrozole toxicities:Occasional hot flashes and mild muscle stiffness but significant improvement from anastrozole.  Arthralgias/myalgias:Much improved on letrozole. Ulnar nerve neuropathy: Taking turmeric Bone density 07/01/2018: Normal T score 0.1  Breast cancer surveillance: 1. Breast MRI 07/14/2019: 9 mm left breast lesion along the surgical scar, MRI guided biopsy is recommended 2.  Breast exam 07/14/2019: Benign 3.  Mammogram 08/01/2018: Benign, breast density category C  Patient is extremely anxious and worried about the MRI findings.  I discussed with her that similar to  previous findings this could also be benign in nature.  We will obtain a breast biopsy and then decide what are her options. I renewed her prescriptions for Xanax and letrozole and Lexapro today.  The biopsies are negative then I will see her back in 1 year.   No orders of the defined types were placed in this encounter.  The patient has a good understanding of the overall plan. she agrees with it. she will call with any problems that may develop before the next visit here.  Total time spent: 30  mins including face to face time and time spent for planning, charting and coordination of care  Nicholas Lose, MD 07/14/2019  I, Cloyde Reams Dorshimer, am acting as scribe for Dr. Nicholas Lose.  I have reviewed the above documentation for accuracy and completeness, and I agree with the above.

## 2019-07-14 ENCOUNTER — Ambulatory Visit
Admission: RE | Admit: 2019-07-14 | Discharge: 2019-07-14 | Disposition: A | Discharge status: Home / Self Care | Payer: BC Managed Care – PPO | Source: Ambulatory Visit | Attending: Hematology and Oncology | Admitting: Hematology and Oncology

## 2019-07-14 ENCOUNTER — Other Ambulatory Visit: Payer: Self-pay | Admitting: Hematology and Oncology

## 2019-07-14 ENCOUNTER — Inpatient Hospital Stay: Payer: BC Managed Care – PPO | Attending: Hematology and Oncology | Admitting: Hematology and Oncology

## 2019-07-14 ENCOUNTER — Other Ambulatory Visit: Payer: Self-pay

## 2019-07-14 DIAGNOSIS — Z923 Personal history of irradiation: Secondary | ICD-10-CM | POA: Insufficient documentation

## 2019-07-14 DIAGNOSIS — Z79899 Other long term (current) drug therapy: Secondary | ICD-10-CM | POA: Diagnosis not present

## 2019-07-14 DIAGNOSIS — C50212 Malignant neoplasm of upper-inner quadrant of left female breast: Secondary | ICD-10-CM | POA: Insufficient documentation

## 2019-07-14 DIAGNOSIS — Z9079 Acquired absence of other genital organ(s): Secondary | ICD-10-CM | POA: Insufficient documentation

## 2019-07-14 DIAGNOSIS — Z79811 Long term (current) use of aromatase inhibitors: Secondary | ICD-10-CM | POA: Diagnosis not present

## 2019-07-14 DIAGNOSIS — N6320 Unspecified lump in the left breast, unspecified quadrant: Secondary | ICD-10-CM

## 2019-07-14 DIAGNOSIS — Z9221 Personal history of antineoplastic chemotherapy: Secondary | ICD-10-CM | POA: Diagnosis not present

## 2019-07-14 DIAGNOSIS — Z90722 Acquired absence of ovaries, bilateral: Secondary | ICD-10-CM | POA: Insufficient documentation

## 2019-07-14 DIAGNOSIS — Z1231 Encounter for screening mammogram for malignant neoplasm of breast: Secondary | ICD-10-CM

## 2019-07-14 DIAGNOSIS — Z170 Estrogen receptor positive status [ER+]: Secondary | ICD-10-CM | POA: Diagnosis not present

## 2019-07-14 MED ORDER — ALPRAZOLAM 0.25 MG PO TABS: 0.2500 mg | ORAL_TABLET | Freq: Every evening | ORAL | 1 refills | Status: DC | PRN

## 2019-07-14 MED ORDER — LETROZOLE 2.5 MG PO TABS: 2.5000 mg | ORAL_TABLET | Freq: Every day | ORAL | 3 refills | Status: DC

## 2019-07-14 MED ORDER — ESCITALOPRAM OXALATE 20 MG PO TABS: 20.0000 mg | ORAL_TABLET | Freq: Every day | ORAL | 3 refills | Status: DC

## 2019-07-14 MED ORDER — GADOBUTROL 1 MMOL/ML IV SOLN
9.0000 mL | Freq: Once | INTRAVENOUS | Status: AC | PRN
  Administered 2019-07-14: 9 mL via INTRAVENOUS

## 2019-07-14 NOTE — Assessment & Plan Note (Signed)
08/19/2017:Left lumpectomy: IDC grade 2, 2.5 cm, DCIS intermediate grade, lymphovascular invasion identified, margins negative, 1/2 lymph nodes positive with extracapsular extension, ER 90%, PR 40%, HER-2 negative ratio 1.15, Ki-67 15% T2 N1a stage II a; Mammaprint high risk   Recommendation: 1. Adjuvant chemotherapy with dose dense Adriamycin and Cytoxan x4 followed by Taxol weekly x12 completed 01/31/2018 2. followed by adjuvant radiation therapywhich will be completed 04/09/2018 3.Bilateral salpingo-oophorectomy 05/15/2018 4.Followed by adjuvant antiestrogen therapy;will enroll the patient in [facility] clinical trial _______________________________________________________________________________________________  Surveillance:Breast MRI August 2020 with sedation(I did not see any contraindications for sedation.)  Current treatment: Anastrozole 1 mg daily started 06/02/2018 discontinued 07/02/2018 due to diffuse muscle aches and pains,switched to letrozole 07/14/2018  Letrozole toxicities:Occasional hot flashes and mild muscle stiffness but significant improvement from anastrozole.  Arthralgias/myalgias:Much improved on letrozole. Ulnar nerve neuropathy: Taking turmeric Bone density 07/01/2018: Normal T score 0.1  Breast cancer surveillance: 1. 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  Breast exam 07/14/2019: Benign 3.  Mammogram 08/01/2018: Benign, breast density category C

## 2019-07-15 ENCOUNTER — Telehealth: Payer: Self-pay | Admitting: Hematology and Oncology

## 2019-07-15 NOTE — Telephone Encounter (Signed)
I talk with patient regarding schedule  

## 2019-07-21 ENCOUNTER — Other Ambulatory Visit: Payer: BC Managed Care – PPO

## 2019-07-22 ENCOUNTER — Encounter: Payer: Self-pay | Admitting: Physician Assistant

## 2019-07-22 ENCOUNTER — Ambulatory Visit (INDEPENDENT_AMBULATORY_CARE_PROVIDER_SITE_OTHER): Payer: BC Managed Care – PPO | Admitting: Physician Assistant

## 2019-07-22 ENCOUNTER — Other Ambulatory Visit: Payer: Self-pay

## 2019-07-22 DIAGNOSIS — Z9889 Other specified postprocedural states: Secondary | ICD-10-CM

## 2019-07-22 NOTE — Progress Notes (Signed)
HPI: [name] returns today now 7 weeks status post left carpal tunnel release.  She states overall she is doing well.  She does have some stiffness about the wrist.  States numbness tingling is greatly dissipating.  She still has some numbness tingling ring and pinky finger.  She notes that after radiation treatment she did have some numbness tingling down her arm from her elbow and is slowly getting better.  Physical exam: Left hand subjective decreased sensation over the left index and long finger.  Also decreased sensation over the ulnar distribution of the ring finger and over the fifth finger on the left hand compared to the right.  Surgical incision is healing well no signs of infection.  Impression: Status post left carpal tunnel release 7 weeks postop  Plan: She will follow-up with [place] as needed.  Scar tissue mobilization encouraged.  Discussed with her that it may take still some more time for her to gain full sensation back to the hand.

## 2019-07-24 DIAGNOSIS — F4323 Adjustment disorder with mixed anxiety and depressed mood: Secondary | ICD-10-CM | POA: Diagnosis not present

## 2019-07-28 ENCOUNTER — Ambulatory Visit
Admission: RE | Admit: 2019-07-28 | Discharge: 2019-07-28 | Disposition: A | Discharge status: Home / Self Care | Payer: BC Managed Care – PPO | Source: Ambulatory Visit | Attending: Hematology and Oncology | Admitting: Hematology and Oncology

## 2019-07-28 ENCOUNTER — Other Ambulatory Visit: Payer: Self-pay | Admitting: General Practice

## 2019-07-28 ENCOUNTER — Other Ambulatory Visit: Payer: Self-pay

## 2019-07-28 DIAGNOSIS — N6320 Unspecified lump in the left breast, unspecified quadrant: Secondary | ICD-10-CM

## 2019-07-28 DIAGNOSIS — N641 Fat necrosis of breast: Secondary | ICD-10-CM | POA: Diagnosis not present

## 2019-07-28 DIAGNOSIS — N6342 Unspecified lump in left breast, subareolar: Secondary | ICD-10-CM | POA: Diagnosis not present

## 2019-07-28 MED ORDER — GADOBUTROL 1 MMOL/ML IV SOLN
9.0000 mL | Freq: Once | INTRAVENOUS | Status: AC | PRN
  Administered 2019-07-28: 09:00:00 9 mL via INTRAVENOUS

## 2019-07-30 ENCOUNTER — Other Ambulatory Visit: Payer: BC Managed Care – PPO

## 2019-08-07 ENCOUNTER — Ambulatory Visit: Payer: BC Managed Care – PPO | Attending: Internal Medicine

## 2019-08-07 DIAGNOSIS — F4323 Adjustment disorder with mixed anxiety and depressed mood: Secondary | ICD-10-CM | POA: Diagnosis not present

## 2019-08-07 DIAGNOSIS — Z23 Encounter for immunization: Secondary | ICD-10-CM

## 2019-08-07 NOTE — Progress Notes (Signed)
   Covid-19 Vaccination Clinic  Name:  [name]    MRN: [id number] DOB: 03/30/1981  08/07/2019  [name] was observed post Covid-19 immunization for 15 minutes without incident. She was provided with Vaccine Information Sheet and instruction to access the V-Safe system.   [name] was instructed to call 911 with any severe reactions post vaccine: Marland Kitchen Difficulty breathing  . Swelling of face and throat  . A fast heartbeat  . A bad rash all over body  . Dizziness and weakness   Immunizations Administered    Name Date Dose VIS Date Route   Moderna COVID-19 Vaccine 08/07/2019 10:24 AM 0.5 mL 04/28/2019 Intramuscular   Manufacturer: Moderna   Lot: [id number]   [id number]

## 2019-08-28 DIAGNOSIS — F4323 Adjustment disorder with mixed anxiety and depressed mood: Secondary | ICD-10-CM | POA: Diagnosis not present

## 2019-09-07 DIAGNOSIS — N390 Urinary tract infection, site not specified: Secondary | ICD-10-CM | POA: Diagnosis not present

## 2019-09-09 ENCOUNTER — Ambulatory Visit: Payer: BC Managed Care – PPO | Attending: Internal Medicine

## 2019-09-09 DIAGNOSIS — Z23 Encounter for immunization: Secondary | ICD-10-CM

## 2019-09-09 NOTE — Progress Notes (Signed)
   Covid-19 Vaccination Clinic  Name:  [name]    MRN: [id number] DOB: Dec 08, 1980  09/09/2019  [name] was observed post Covid-19 immunization for 15 minutes without incident. She was provided with Vaccine Information Sheet and instruction to access the V-Safe system.   [name] was instructed to call 911 with any severe reactions post vaccine: Marland Kitchen Difficulty breathing  . Swelling of face and throat  . A fast heartbeat  . A bad rash all over body  . Dizziness and weakness   Immunizations Administered    Name Date Dose VIS Date Route   Moderna COVID-19 Vaccine 09/09/2019  9:40 AM 0.5 mL 04/28/2019 Intramuscular   Manufacturer: Moderna   Lot: [id number]   [place] [id number]

## 2019-09-15 DIAGNOSIS — Z01419 Encounter for gynecological examination (general) (routine) without abnormal findings: Secondary | ICD-10-CM | POA: Diagnosis not present

## 2019-09-15 DIAGNOSIS — Z6841 Body Mass Index (BMI) 40.0 and over, adult: Secondary | ICD-10-CM | POA: Diagnosis not present

## 2019-09-16 IMAGING — DX DG CHEST 1V PORT
1 series · 1 of 1 positions shown · non-contrast
Comparison: None.

ADDENDUM:
This addendum is given for the purpose of clarifying the position of
the tip of the patient's Port-A-Cath. The tip of the catheter is at
the brachiocephalic junction which is the origin of the superior
vena cava.
CLINICAL DATA: Status post Port-A-Cath placement today.

EXAM:
PORTABLE CHEST 1 VIEW

[chest ap]
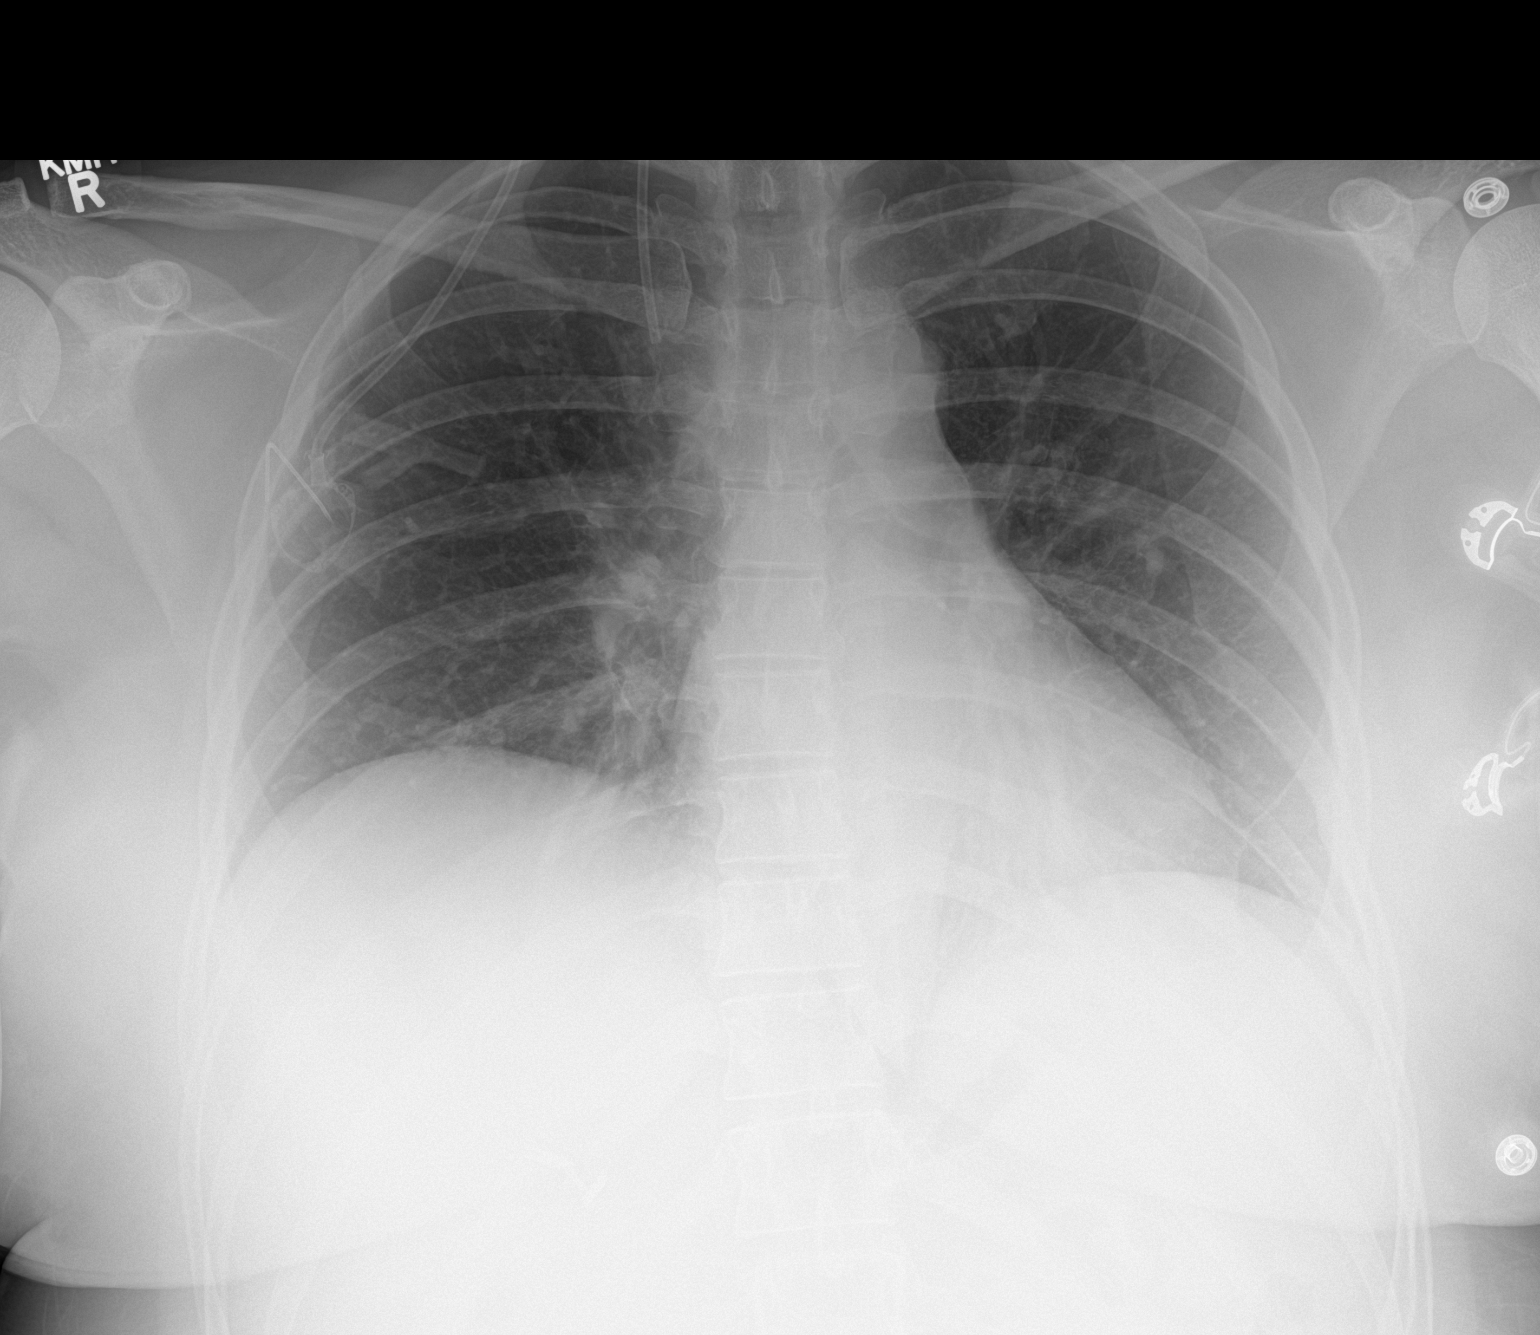

[1 of 1 positions shown; findings below may reference images not displayed]

FINDINGS: Right IJ approach Port-A-Cath is in place. Superior loop of the
catheter is off the upper margin of the film. Tip of the catheter
projects near the junction of the brachiocephalic veins. No
pneumothorax. Heart size is normal. No pleural effusion. No acute
bony abnormality.
IMPRESSION: Tip of right IJ approach Port-A-Cath projects at approximately the
junction of the brachiocephalic veins. Superior loop of the catheter
is off the upper margin of the film. Negative for pneumothorax.

## 2019-09-25 DIAGNOSIS — F4323 Adjustment disorder with mixed anxiety and depressed mood: Secondary | ICD-10-CM | POA: Diagnosis not present

## 2019-10-13 DIAGNOSIS — H6981 Other specified disorders of Eustachian tube, right ear: Secondary | ICD-10-CM | POA: Diagnosis not present

## 2019-10-22 IMAGING — CT CT CHEST W/ CM
2 of 5 series · 13 of 36 positions shown, 16 images · IV contrast (ISOVUE 300)
Comparison: None.

CLINICAL DATA: Staging breast cancer. Initial diagnosis June 2017 status post lumpectomy. Undergoing chemotherapy.

EXAM:
CT CHEST, ABDOMEN, AND PELVIS WITH CONTRAST
TECHNIQUE: Multidetector CT imaging of the chest, abdomen and pelvis was
performed following the standard protocol during bolus
administration of intravenous contrast.
CONTRAST:  100mL 16WZMJ-X99 IOPAMIDOL ([id number]-X99) INJECTION 61%

[Series 2: cap with · axial · 0.83mm/px · z∈[-726,-186]mm · 10 of 132 slices shown, 13 images]
[im 12/132  mediastinal]
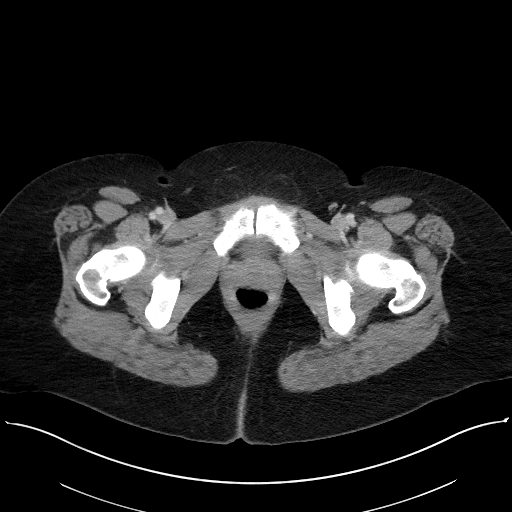
[im 12/132  lung]
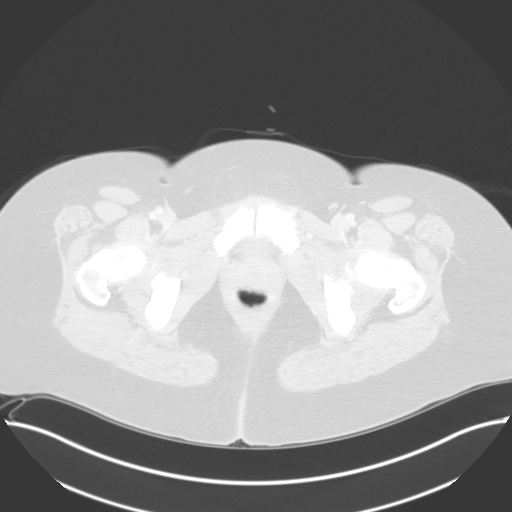
[im 24/132  lung]
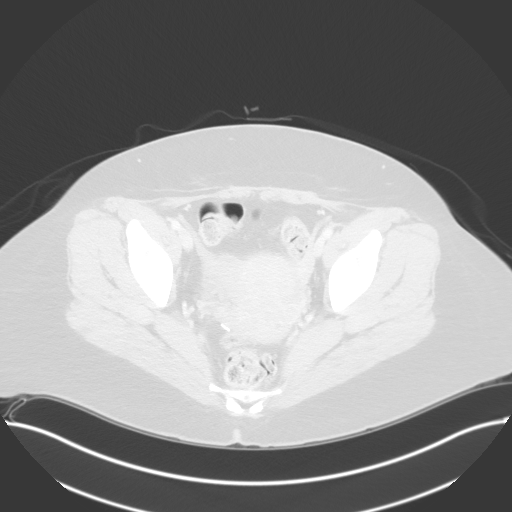
[im 36/132  lung]
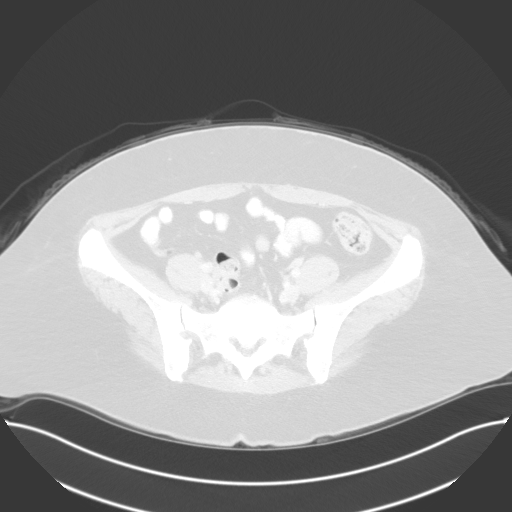
[im 48/132  lung]
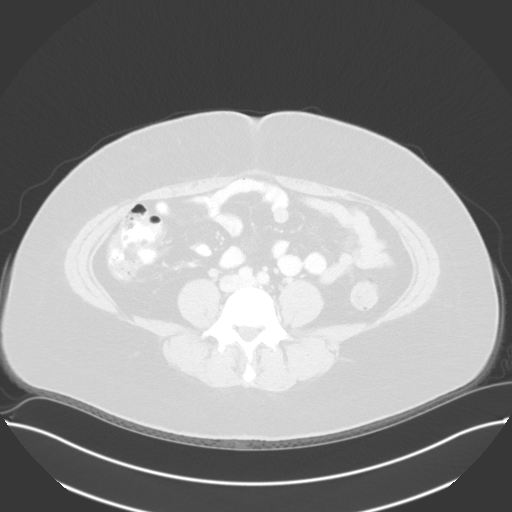
[im 60/132  mediastinal]
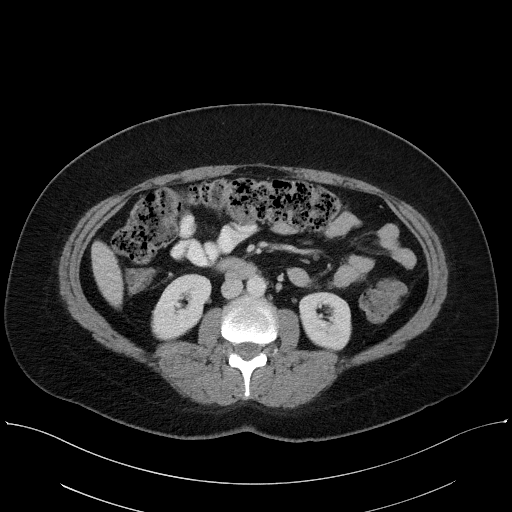
[im 60/132  lung]
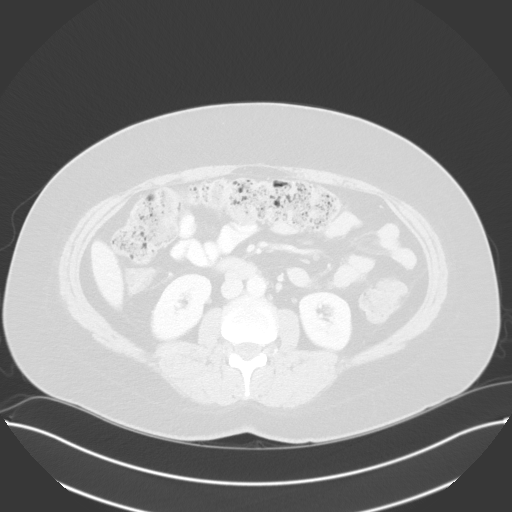
[im 72/132  lung]
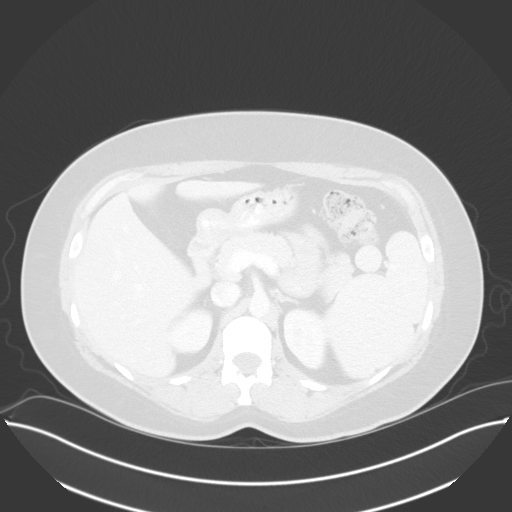
[im 84/132  lung]
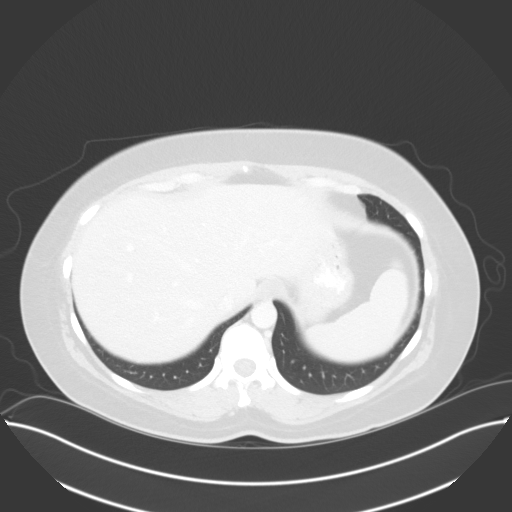
[im 96/132  lung]
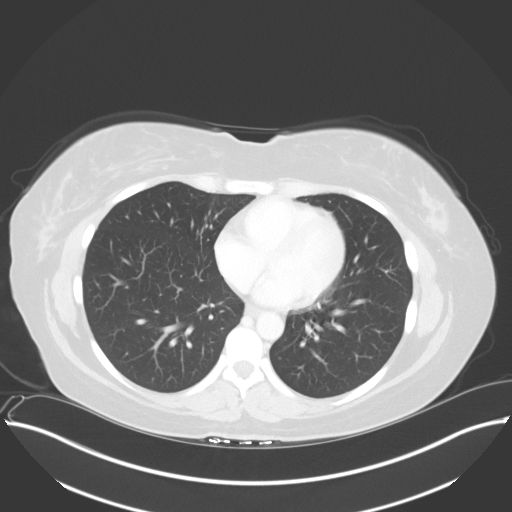
[im 108/132  mediastinal]
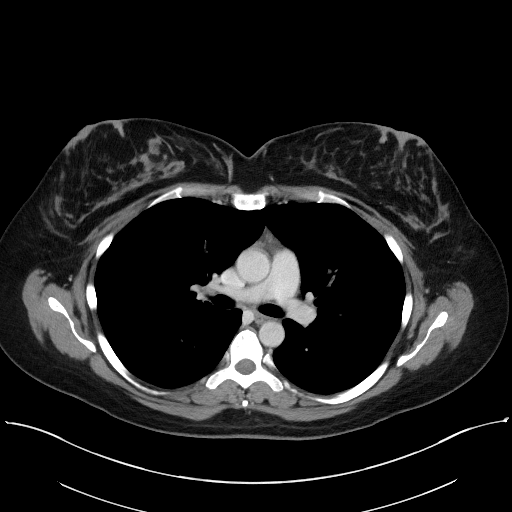
[im 108/132  lung]
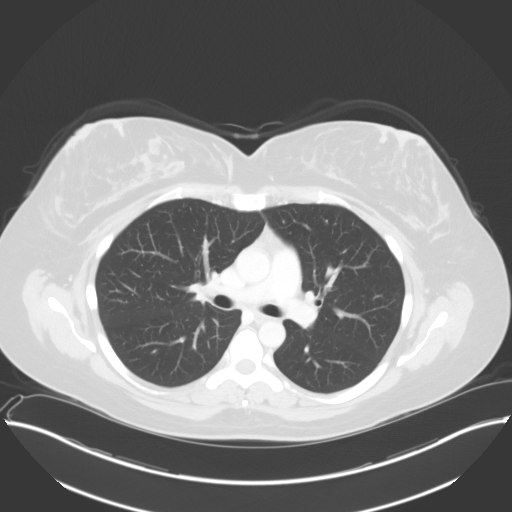
[im 120/132  lung]
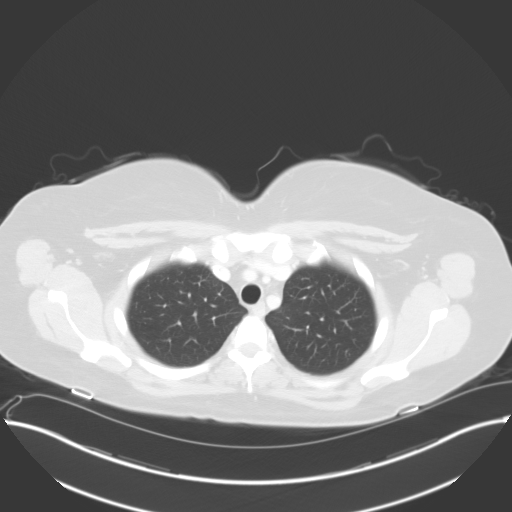

[Series 5: coronals · coronal · 1.01mm/px · 3 of 118 slices shown]
[im 24/118  lung]
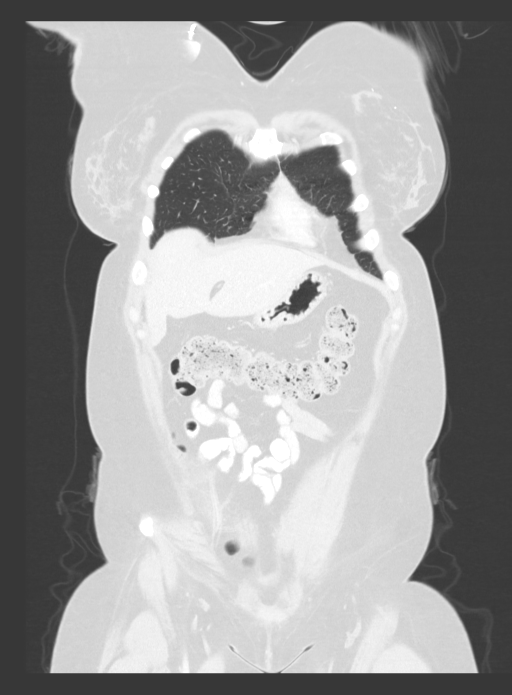
[im 47/118  lung]
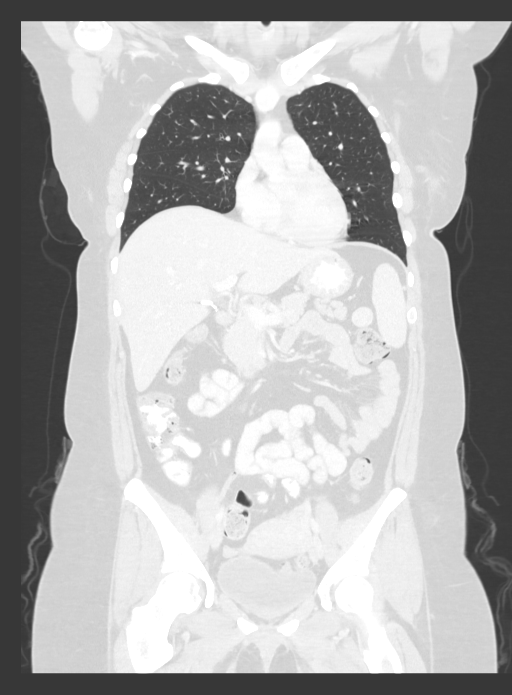
[im 71/118  lung]
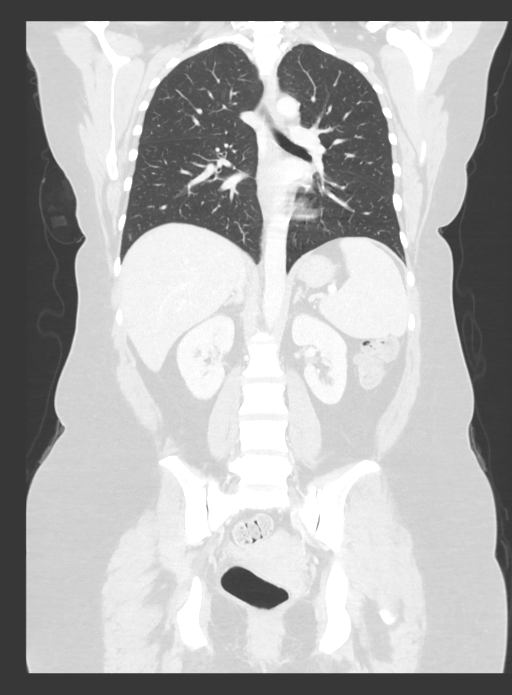

[13 of 36 positions shown; findings below may reference images not displayed]

FINDINGS: CT CHEST FINDINGS

Cardiovascular: The heart is normal in size. No pericardial
effusion. The aorta is normal in caliber. No dissection. No
atherosclerotic calcifications. The branch vessels are patent. No
coronary artery calcifications.

Mediastinum/Nodes: No mediastinal or hilar mass or lymphadenopathy.
The esophagus is grossly normal.

Lungs/Pleura: No worrisome pulmonary nodules to suggest pulmonary
metastatic disease. No acute pulmonary findings. No pleural effusion
or pleural nodule. No interstitial lung disease or bronchiectasis.

Musculoskeletal: Surgical changes involving the left lateral breast
but no worrisome mass lesions. No supraclavicular or axillary
lymphadenopathy.

The bony structures are intact. No worrisome lytic or sclerotic bone
lesions to suggest metastatic disease.

CT ABDOMEN PELVIS FINDINGS

Hepatobiliary: No focal hepatic lesions to suggest metastatic
disease. The gallbladder is surgically absent. No biliary
dilatation.

Pancreas: No mass, inflammation or ductal dilatation.

Spleen: Normal size.  No focal lesions.

Adrenals/Urinary Tract: The adrenal glands and kidneys are
unremarkable. The bladder is normal.

Stomach/Bowel: The stomach, duodenum, small bowel and colon are
unremarkable. No acute inflammatory changes, mass lesions or
obstructive findings. The terminal ileum is normal. The appendix is
normal.

Vascular/Lymphatic: The aorta is normal in caliber. No dissection.
The branch vessels are patent. The major venous structures are
patent. No mesenteric or retroperitoneal mass or adenopathy. Small
scattered lymph nodes are noted.

Reproductive: The uterus and ovaries are unremarkable.

Other: No pelvic mass or adenopathy. No free pelvic fluid
collections. No inguinal mass or adenopathy. No abdominal wall
hernia or subcutaneous lesions.

Musculoskeletal: No worrisome bone lesions suspicious for metastatic
disease. Mild SI joint degenerative changes and osteitis condensans
Gutierrez.
IMPRESSION: 1. Surgical changes involving the left breast. No supraclavicular or
axillary adenopathy.
2. No findings for metastatic disease involving the lungs,
abdomen/pelvis or osseous structures.

## 2019-10-30 DIAGNOSIS — F4323 Adjustment disorder with mixed anxiety and depressed mood: Secondary | ICD-10-CM | POA: Diagnosis not present

## 2019-12-25 DIAGNOSIS — F4323 Adjustment disorder with mixed anxiety and depressed mood: Secondary | ICD-10-CM | POA: Diagnosis not present

## 2019-12-31 DIAGNOSIS — R928 Other abnormal and inconclusive findings on diagnostic imaging of breast: Secondary | ICD-10-CM | POA: Diagnosis not present

## 2019-12-31 DIAGNOSIS — Z853 Personal history of malignant neoplasm of breast: Secondary | ICD-10-CM | POA: Diagnosis not present

## 2020-01-04 ENCOUNTER — Telehealth: Payer: Self-pay | Admitting: Hematology and Oncology

## 2020-01-04 NOTE — Telephone Encounter (Signed)
Rescheduled appointment per 8/5 provider message. Left message with updated appointment date and time.

## 2020-01-29 ENCOUNTER — Other Ambulatory Visit: Payer: Self-pay | Admitting: Hematology and Oncology

## 2020-03-01 DIAGNOSIS — R300 Dysuria: Secondary | ICD-10-CM | POA: Diagnosis not present

## 2020-03-07 ENCOUNTER — Encounter: Payer: Self-pay | Admitting: Hematology and Oncology

## 2020-06-27 ENCOUNTER — Other Ambulatory Visit: Payer: Self-pay | Admitting: *Deleted

## 2020-06-27 DIAGNOSIS — Z170 Estrogen receptor positive status [ER+]: Secondary | ICD-10-CM

## 2020-06-27 DIAGNOSIS — C50212 Malignant neoplasm of upper-inner quadrant of left female breast: Secondary | ICD-10-CM

## 2020-07-12 ENCOUNTER — Inpatient Hospital Stay: Payer: Self-pay | Admitting: Hematology and Oncology

## 2020-07-13 ENCOUNTER — Ambulatory Visit: Payer: BC Managed Care – PPO | Admitting: Hematology and Oncology

## 2020-07-18 ENCOUNTER — Other Ambulatory Visit: Payer: Self-pay

## 2020-07-18 ENCOUNTER — Ambulatory Visit
Admission: RE | Admit: 2020-07-18 | Discharge: 2020-07-18 | Disposition: A | Discharge status: Home / Self Care | Payer: Commercial Managed Care - PPO | Source: Ambulatory Visit | Attending: Hematology and Oncology | Admitting: Hematology and Oncology

## 2020-07-18 DIAGNOSIS — Z170 Estrogen receptor positive status [ER+]: Secondary | ICD-10-CM

## 2020-07-18 DIAGNOSIS — C50212 Malignant neoplasm of upper-inner quadrant of left female breast: Secondary | ICD-10-CM

## 2020-07-18 MED ORDER — GADOBUTROL 1 MMOL/ML IV SOLN
10.0000 mL | Freq: Once | INTRAVENOUS | Status: AC | PRN
  Administered 2020-07-18: 10 mL via INTRAVENOUS

## 2020-07-19 ENCOUNTER — Telehealth: Payer: Self-pay | Admitting: *Deleted

## 2020-07-19 NOTE — Progress Notes (Signed)
Patient Care Team: Jonathon Jordan, MD as PCP - General (Family Medicine) Nicholas Lose, MD as Consulting Physician (Hematology and Oncology) Kyung Rudd, MD as Consulting Physician (Radiation Oncology) Excell Seltzer, MD (Inactive) as Consulting Physician (General Surgery)  DIAGNOSIS:    ICD-10-CM   1. Malignant neoplasm of upper-inner quadrant of left breast in female, estrogen receptor positive ([place])  C50.212    Z17.0     SUMMARY OF ONCOLOGIC HISTORY: Oncology History  Malignant neoplasm of upper-inner quadrant of left breast in female, estrogen receptor positive ([place])  07/24/2017 Initial Diagnosis   Left breast palpable lump upper inner quadrant 11 o'clock position: Ill-defined 1.6 cm mass, axilla ultrasound negative, ultrasound-guided biopsy: Grade 2 IDC ER 90%, PR 40%, Ki-67 15%, HER-2 negative   08/09/2017 Genetic Testing   Negative genetic testing common hereditary cancer panel.  The Hereditary Gene Panel offered by [facility] includes sequencing and/or deletion duplication testing of the following 47 genes: APC, ATM, AXIN2, BARD1, BMPR1A, BRCA1, BRCA2, BRIP1, CDH1, CDK4, CDKN2A (p14ARF), CDKN2A (p16INK4a), CHEK2, CTNNA1, DICER1, EPCAM (Deletion/duplication testing only), GREM1 (promoter region deletion/duplication testing only), KIT, MEN1, MLH1, MSH2, MSH3, MSH6, MUTYH, NBN, NF1, NHTL1, PALB2, PDGFRA, PMS2, POLD1, POLE, PTEN, RAD50, RAD51C, RAD51D, SDHB, SDHC, SDHD, SMAD4, SMARCA4. STK11, TP53, TSC1, TSC2, and VHL.  The following genes were evaluated for sequence changes only: SDHA and HOXB13 c.251G>A variant only. The report date is August 07, 2017.   08/16/2017 Surgery   Left lumpectomy: IDC grade 2, 2.5 cm, DCIS intermediate grade, lymphovascular invasion identified, margins negative, 1/2 lymph nodes positive with extracapsular extension, ER 90%, PR 40%, HER-2 negative ratio 1.15, Ki-67 15% T2 N1a stage II a; Mammaprint high risk    08/16/2017 Miscellaneous   MammaPrint 08/16/17   High risk with average 10-year Risk of recurrence Untreated: 29% MPI: -0.399 94.6% benefit of chemotherapy and Hormonal therapy at 5 years from distant recurrence.    09/12/2017 - 01/31/2018 Chemotherapy   Adjuvant chemotherapy with dose dense Adriamycin and Cytoxan x4 followed by Taxol weekly x12   02/24/2018 - 04/09/2018 Radiation Therapy   Radiation therapy with Dr. Lisbeth Renshaw 02/24/18-04/09/18   05/13/2018 Surgery   Bilateral salpingo-oophorectomy with lysis of left adnexal adhesions   05/28/2018 -  Anti-estrogen oral therapy   Anastrozole: Discontinued due to joint pains and stiffness, switched to letrozole 07/14/2018     CHIEF COMPLIANT:  Follow-up of left breast cancer on letrozole  INTERVAL HISTORY: [name] is a 40 y.o. with above-mentioned history of left breast cancer treated with lumpectomy,adjuvant chemotherapy,radiation and bilateral salpingo-oophorectomy. She is currently on anti-estrogen therapy with letrozole.Breast MRI on 07/18/20 showed no evidence of malignancy bilaterally. She presents to the clinic today for follow-up  ALLERGIES:  has No Known Allergies.  MEDICATIONS:  Current Outpatient Medications  Medication Sig Dispense Refill  . ALPRAZolam (XANAX) 0.25 MG tablet TAKE 1 TABLET (0.25 MG TOTAL) BY MOUTH AT BEDTIME AS NEEDED FOR ANXIETY. 30 tablet 0  . escitalopram (LEXAPRO) 20 MG tablet Take 1 tablet (20 mg total) by mouth daily with supper. 90 tablet 3  . letrozole (FEMARA) 2.5 MG tablet Take 1 tablet (2.5 mg total) by mouth daily. 90 tablet 3   No current facility-administered medications for this visit.   Facility-Administered Medications Ordered in Other Visits  Medication Dose Route Frequency Provider Last Rate Last Admin  . heparin lock flush 100 unit/mL  500 Units Intracatheter Once PRN Nicholas Lose, MD      . sodium chloride flush (NS) 0.9 % injection 10  mL  10 mL Intracatheter PRN Nicholas Lose, MD   10 mL at 11/15/17 1449  . sodium chloride flush  (NS) 0.9 % injection 10 mL  10 mL Intracatheter PRN Nicholas Lose, MD      . sodium chloride flush (NS) 0.9 % injection 10 mL  10 mL Intracatheter PRN Nicholas Lose, MD   10 mL at 12/13/17 1500    PHYSICAL EXAMINATION: ECOG PERFORMANCE STATUS: 1 - Symptomatic but completely ambulatory  Vitals:   07/20/20 1221  BP: 126/73  Pulse: 63  Resp: 19  Temp: (!) 97.5 F (36.4 C)  SpO2: 99%   Filed Weights   07/20/20 1221  Weight: 24 lb 2 oz (10.9 kg)     LABORATORY DATA:  I have reviewed the data as listed CMP Latest Ref Rng & Units 01/01/2019 01/31/2018 01/24/2018  Glucose 70 - 99 mg/dL 92 141(H) 161(H)  BUN 6 - 20 mg/dL $Remove'19 13 10  'hEOUzJm$ Creatinine 0.44 - 1.00 mg/dL 0.72 0.83 0.83  Sodium 135 - 145 mmol/L 139 140 141  Potassium 3.5 - 5.1 mmol/L 3.8 3.9 3.9  Chloride 98 - 111 mmol/L 107 106 106  CO2 22 - 32 mmol/L $RemoveB'23 24 26  'AatRLoKx$ Calcium 8.9 - 10.3 mg/dL 9.0 8.8(L) 8.9  Total Protein 6.5 - 8.1 g/dL - 6.4(L) 6.3(L)  Total Bilirubin 0.3 - 1.2 mg/dL - 0.5 0.4  Alkaline Phos 38 - 126 U/L - 76 72  AST 15 - 41 U/L - 32 33  ALT 0 - 44 U/L - 45(H) 51(H)    Lab Results  Component Value Date   WBC 5.4 05/08/2018   HGB 12.3 05/08/2018   HCT 36.4 05/08/2018   MCV 90.1 05/08/2018   PLT 201 05/08/2018   NEUTROABS 3.9 01/31/2018    ASSESSMENT & PLAN:  Malignant neoplasm of upper-inner quadrant of left breast in female, estrogen receptor positive ([place]) 08/19/2017:Left lumpectomy: IDC grade 2, 2.5 cm, DCIS intermediate grade, lymphovascular invasion identified, margins negative, 1/2 lymph nodes positive with extracapsular extension, ER 90%, PR 40%, HER-2 negative ratio 1.15, Ki-67 15% T2 N1a stage II a; Mammaprint high risk   Recommendation: 1. Adjuvant chemotherapy with dose dense Adriamycin and Cytoxan x4 followed by Taxol weekly x12 completed 01/31/2018 2. followed by adjuvant radiation therapywhich will be completed 04/09/2018 3.Bilateral salpingo-oophorectomy 05/15/2018 4.Followed by adjuvant  antiestrogen therapy;will enroll the patient in Natalee clinical trial _______________________________________________________________________________________________  Surveillance:Breast MRI August 2020 with sedation(I did not see any contraindications for sedation.)  Current treatment:Anastrozole 1 mg daily started 06/02/2018 discontinued 07/02/2018 due to diffuse muscle aches and pains,switched to letrozole 07/14/2018 Recommended adding abemaciclib based on clinical trial [facility]  Letrozole toxicities:Occasional hot flashes and mild muscle stiffness but significant improvement from anastrozole.  Arthralgias/myalgias:Much improved on letrozole. Ulnar nerve neuropathy: Taking turmeric Bone density 07/01/2018: Normal T score 0.1  Breast cancer surveillance: 1. Breast MRI  07/18/2020: Benign breast density category B 2.  Breast exam 07/20/2020: Benign 3.  Mammogram 12/31/2019 at [facility]: Benign, breast density category C   Abemaciclib counseling: I discussed at length the risks and benefits of Abemaciclib in combination with letrozole. Adverse effects of Abemaciclib include decreasing neutrophil count, pneumonia, blood clots in lungs as well as nausea and GI symptoms. Side effects of letrozole include hot flashes, muscle aches and pains, uterine bleeding/spotting/cancer, osteoporosis, risk of blood clots.  Return to clinic in 3 weeks for labs and follow-up.    No orders of the defined types were placed in this encounter.  The patient  has a good understanding of the overall plan. she agrees with it. she will call with any problems that may develop before the next visit here.  Total time spent: 20 mins including face to face time and time spent for planning, charting and coordination of care  Rulon Eisenmenger, MD, MPH 07/20/2020  I, Cloyde Reams Dorshimer, am acting as scribe for Dr. Nicholas Lose.  I have reviewed the above documentation for accuracy and completeness, and I agree with the  above.

## 2020-07-19 NOTE — Assessment & Plan Note (Signed)
08/19/2017:Left lumpectomy: IDC grade 2, 2.5 cm, DCIS intermediate grade, lymphovascular invasion identified, margins negative, 1/2 lymph nodes positive with extracapsular extension, ER 90%, PR 40%, HER-2 negative ratio 1.15, Ki-67 15% T2 N1a stage II a; Mammaprint high risk   Recommendation: 1. Adjuvant chemotherapy with dose dense Adriamycin and Cytoxan x4 followed by Taxol weekly x12 completed 01/31/2018 2. followed by adjuvant radiation therapywhich will be completed 04/09/2018 3.Bilateral salpingo-oophorectomy 05/15/2018 4.Followed by adjuvant antiestrogen therapy;will enroll the patient in Natalee clinical trial _______________________________________________________________________________________________  Surveillance:Breast MRI August 2020 with sedation(I did not see any contraindications for sedation.)  Current treatment:Anastrozole 1 mg daily started 06/02/2018 discontinued 07/02/2018 due to diffuse muscle aches and pains,switched to letrozole 07/14/2018  Letrozole toxicities:Occasional hot flashes and mild muscle stiffness but significant improvement from anastrozole.  Arthralgias/myalgias:Much improved on letrozole. Ulnar nerve neuropathy: Taking turmeric Bone density 07/01/2018: Normal T score 0.1  Breast cancer surveillance: 1. Breast MRI 07/14/2019: 9 mm left breast lesion along the surgical scar, MRI guided biopsy is recommended 2.  Breast exam 07/14/2019: Benign 3.  Mammogram 07/18/20: Benign, breast density category C

## 2020-07-19 NOTE — Telephone Encounter (Signed)
Called pt with MRI results. Discussed negative MRI

## 2020-07-20 ENCOUNTER — Other Ambulatory Visit: Payer: Self-pay | Admitting: Hematology and Oncology

## 2020-07-20 ENCOUNTER — Telehealth: Payer: Self-pay | Admitting: Pharmacist

## 2020-07-20 ENCOUNTER — Inpatient Hospital Stay: Payer: Commercial Managed Care - PPO | Attending: Hematology and Oncology | Admitting: Hematology and Oncology

## 2020-07-20 ENCOUNTER — Inpatient Hospital Stay: Payer: Commercial Managed Care - PPO

## 2020-07-20 ENCOUNTER — Telehealth: Payer: Self-pay

## 2020-07-20 ENCOUNTER — Other Ambulatory Visit: Payer: Self-pay

## 2020-07-20 ENCOUNTER — Telehealth: Payer: Self-pay | Admitting: Hematology and Oncology

## 2020-07-20 DIAGNOSIS — Z923 Personal history of irradiation: Secondary | ICD-10-CM | POA: Insufficient documentation

## 2020-07-20 DIAGNOSIS — Z9221 Personal history of antineoplastic chemotherapy: Secondary | ICD-10-CM | POA: Diagnosis not present

## 2020-07-20 DIAGNOSIS — Z170 Estrogen receptor positive status [ER+]: Secondary | ICD-10-CM | POA: Diagnosis not present

## 2020-07-20 DIAGNOSIS — Z79811 Long term (current) use of aromatase inhibitors: Secondary | ICD-10-CM | POA: Diagnosis not present

## 2020-07-20 DIAGNOSIS — Z79899 Other long term (current) drug therapy: Secondary | ICD-10-CM | POA: Diagnosis not present

## 2020-07-20 DIAGNOSIS — C50212 Malignant neoplasm of upper-inner quadrant of left female breast: Secondary | ICD-10-CM

## 2020-07-20 LAB — CBC WITH DIFFERENTIAL (CANCER CENTER ONLY)
Abs Immature Granulocytes: 0.01 10*3/uL (ref 0.00–0.07)
Basophils Absolute: 0.1 10*3/uL (ref 0.0–0.1)
Basophils Relative: 1 %
Eosinophils Absolute: 0.1 10*3/uL (ref 0.0–0.5)
Eosinophils Relative: 2 %
HCT: 37.4 % (ref 36.0–46.0)
Hemoglobin: 12.9 g/dL (ref 12.0–15.0)
Immature Granulocytes: 0 %
Lymphocytes Relative: 34 %
Lymphs Abs: 1.7 10*3/uL (ref 0.7–4.0)
MCH: 29.7 pg (ref 26.0–34.0)
MCHC: 34.5 g/dL (ref 30.0–36.0)
MCV: 86 fL (ref 80.0–100.0)
Monocytes Absolute: 0.3 10*3/uL (ref 0.1–1.0)
Monocytes Relative: 6 %
Neutro Abs: 3 10*3/uL (ref 1.7–7.7)
Neutrophils Relative %: 57 %
Platelet Count: 195 10*3/uL (ref 150–400)
RBC: 4.35 MIL/uL (ref 3.87–5.11)
RDW: 12.5 % (ref 11.5–15.5)
WBC Count: 5.1 10*3/uL (ref 4.0–10.5)
nRBC: 0 % (ref 0.0–0.2)

## 2020-07-20 LAB — CMP (CANCER CENTER ONLY)
ALT: 12 U/L (ref 0–44)
AST: 18 U/L (ref 15–41)
Albumin: 4.3 g/dL (ref 3.5–5.0)
Alkaline Phosphatase: 100 U/L (ref 38–126)
Anion gap: 8 (ref 5–15)
BUN: 15 mg/dL (ref 6–20)
CO2: 27 mmol/L (ref 22–32)
Calcium: 9.3 mg/dL (ref 8.9–10.3)
Chloride: 105 mmol/L (ref 98–111)
Creatinine: 0.96 mg/dL (ref 0.44–1.00)
GFR, Estimated: >60 mL/min (ref >60)
Glucose, Bld: 94 mg/dL (ref 70–99)
Potassium: 4.2 mmol/L (ref 3.5–5.1)
Sodium: 140 mmol/L (ref 135–145)
Total Bilirubin: 0.7 mg/dL (ref 0.3–1.2)
Total Protein: 7.2 g/dL (ref 6.5–8.1)

## 2020-07-20 MED ORDER — ESCITALOPRAM OXALATE 20 MG PO TABS: 20.0000 mg | ORAL_TABLET | Freq: Every day | ORAL | 3 refills | Status: DC

## 2020-07-20 MED ORDER — ALPRAZOLAM 0.25 MG PO TABS: 0.2500 mg | ORAL_TABLET | Freq: Every evening | ORAL | 3 refills | Status: DC | PRN

## 2020-07-20 MED ORDER — ABEMACICLIB 150 MG PO TABS: 150.0000 mg | ORAL_TABLET | Freq: Two times a day (BID) | ORAL | 3 refills | Status: DC

## 2020-07-20 MED ORDER — VALACYCLOVIR HCL 1 G PO TABS: 1000.0000 mg | ORAL_TABLET | Freq: Two times a day (BID) | ORAL | 1 refills | Status: DC

## 2020-07-20 MED ORDER — LETROZOLE 2.5 MG PO TABS: 2.5000 mg | ORAL_TABLET | Freq: Every day | ORAL | 3 refills | Status: DC

## 2020-07-20 NOTE — Telephone Encounter (Signed)
Oral Oncology Patient Advocate Encounter  Received notification from [place] that prior authorization for Verzenio is required.  PA submitted on CoverMyMeds Key [id number] Status is pending  Oral Oncology Clinic will continue to follow.   [place] Patient [place] Phone [phone] Fax [phone] 07/20/2020 1:18 PM

## 2020-07-20 NOTE — Telephone Encounter (Signed)
Oral Oncology Pharmacist Encounter  Received new prescription for Verzenio (abemaciclib) for the adjuvant treatment of stage IIa ER/PR positive, HER-2 negative breast cancer in conjunction with letrozole, planned duration 2 years.  Prescription dose and frequency assessed.   CBC w/ Diff and CMP from 07/20/20 assessed, labs stable - no dose adjustments warranted.  Current medication list in Epic reviewed, no relevant/significant DDIs with Verzenio identified.  Evaluated chart and no patient barriers to medication adherence noted.   Patient agreement for Verzenio treatment documented in MD note on 07/20/20.  Patient's insurance requires that prescription be filled through [facility] - prescription will be redirected to [facility] for dispensing.   Oral Oncology Clinic will continue to follow for insurance authorization, copayment issues, initial counseling and start date.  Leron Croak, PharmD, BCPS Hematology/Oncology Clinical Pharmacist [facility] [phone] 07/20/2020 4:43 PM

## 2020-07-20 NOTE — Telephone Encounter (Signed)
Scheduled appointment per 2/23 los. Spoke to patient who is aware of appointments date and times. Gave patient calendar print out.

## 2020-07-21 NOTE — Telephone Encounter (Signed)
Oral Oncology Pharmacist Encounter   Prior Authorization for Verzenio [facility]) has been denied.     Appeal letter sent to [place] Department with supporting documentation. Appeal faxed to: [phone]    Oral Oncology Clinic will continue to follow.    Leron Croak, PharmD, BCPS Hematology/Oncology Clinical Pharmacist [facility] [phone] 07/21/2020 2:05 PM

## 2020-07-25 ENCOUNTER — Encounter: Payer: Self-pay | Admitting: *Deleted

## 2020-07-25 ENCOUNTER — Telehealth: Payer: Self-pay | Admitting: Adult Health

## 2020-07-25 NOTE — Telephone Encounter (Signed)
Oral Oncology Pharmacist Encounter   Notified that appeal for Verzenio (abemaciclib) has been upheld.   MD notified. Will proceed with trying to obtain manufacturer assistance for patient at this time.     Oral Oncology Clinic will continue to follow.    Leron Croak, PharmD, BCPS Hematology/Oncology Clinical Pharmacist [facility] [phone] 07/25/2020 7:49 AM

## 2020-07-25 NOTE — Progress Notes (Signed)
Per Request of [name], NP RN successfully faxed breast MRI to [phone] for pt insurance to review for coverage of scan.

## 2020-07-25 NOTE — Telephone Encounter (Signed)
Reviewed with Dr. Boyd Kerbs that patient underwent MRI in 06/2019 that revealed a 70mm lesion that underwent MRI guided biopsy.  She was recommended at the time of pathology results on 07/29/2019 to undergo a follow up MRI 6 months later.  She underwent this MRI on 07/18/2020 and is hoping that the insurance company will deem it medically necessary and pay for it, as she paid out of pocket.  Faxing additional info to [facility] at [phone].  [name], NP

## 2020-07-26 NOTE — Telephone Encounter (Signed)
error 

## 2020-07-30 ENCOUNTER — Other Ambulatory Visit: Payer: Self-pay | Admitting: Hematology and Oncology

## 2020-08-03 ENCOUNTER — Telehealth: Payer: Self-pay | Admitting: Hematology and Oncology

## 2020-08-03 NOTE — Telephone Encounter (Signed)
Cancelled upcoming appointment per patient's request. Patient stated she was not able to start the medication yet and would call back to reschedule when she does. Patient is aware of changes.

## 2020-08-11 ENCOUNTER — Inpatient Hospital Stay: Payer: Commercial Managed Care - PPO

## 2020-08-11 ENCOUNTER — Inpatient Hospital Stay: Payer: Commercial Managed Care - PPO | Admitting: Hematology and Oncology

## 2020-08-12 ENCOUNTER — Telehealth: Payer: Self-pay

## 2020-08-12 NOTE — Telephone Encounter (Signed)
Oral Oncology Patient Advocate Encounter  Met patient in [place] to complete an application for [employer] Patient Assistance Program in an effort to reduce the patient's out of pocket expense for Verzenio to $0.    Application completed and faxed to [phone].   [employer] patient assistance phone number for follow up is [phone].   This encounter will be updated until final determination.      [place] Patient [place] Phone [phone] Fax [phone] 08/12/2020 9:14 AM

## 2020-08-16 NOTE — Telephone Encounter (Signed)
Patient is approved for Verzenio at no cost from [employer] 08/16/20-08/16/21.  Lilly uses [place] [facility] Patient [place] Phone [phone] Fax [phone] 08/16/2020 8:53 AM

## 2020-08-17 NOTE — Telephone Encounter (Signed)
Oral Chemotherapy Pharmacist Encounter  I spoke with patient for overview of: Verzenio for the adjuvant treatment of stage IIa ER/PR positive, HER-2 negative breast cancer in conjunction with letrozole, planned duration 2 years.  Counseled patient on administration, dosing, side effects, monitoring, drug-food interactions, safe handling, storage, and disposal.  Patient will take Verzenio 161m tablets, 1 tablet by mouth twice daily without regard to food.  Patient knows to avoid grapefruit and grapefruit juice.  Verzenio start date: 08/19/20  Adverse effects include but are not limited to: diarrhea, fatigue, nausea, abdominal pain, hair loss, decreased blood counts, and increased liver function tests, and joint pains. Severe, life-threatening, and/or fatal interstitial lung disease (ILD) and/or pneumonitis may occur with CDK 4/6 inhibitors.  Patient will obtain anti diarrheal and alert the office of 4 or more loose stools above baseline.  Reviewed with patient importance of keeping a medication schedule and plan for any missed doses. No barriers to medication adherence identified.  Medication reconciliation performed and medication/allergy list updated.  Patient approved to receive Verzenio through manufacturer assistance through the [employer] Patient has set up medication shipment and expects medication to be delivered 08/19/20.   All questions answered.  Ms. OGipsonvoiced understanding and appreciation.   Medication education handout placed in mail for patient. Patient knows to call the office with questions or concerns. Oral Chemotherapy Clinic phone number provided to patient.   RLeron Croak PharmD, BCPS Hematology/Oncology Clinical Pharmacist [phone]/23/2022 4:10 PM

## 2020-08-17 NOTE — Telephone Encounter (Signed)
Oral Chemotherapy Pharmacist Encounter   Attempted to reach patient to provide update and offer for initial counseling on oral medication: Verzenio (abemaciclib).   No answer. Left voicemail for patient to call back to discuss details of medication acquisition and initial counseling session.  Leron Croak, PharmD, BCPS Hematology/Oncology Clinical Pharmacist [facility] [phone] 08/17/2020 1:44 PM

## 2020-08-18 ENCOUNTER — Telehealth: Payer: Self-pay | Admitting: Hematology and Oncology

## 2020-08-18 NOTE — Telephone Encounter (Signed)
Scheduled appts per 3/24 sch msg. Called pt, no answer. Left msg with appt date and time.  

## 2020-09-01 ENCOUNTER — Encounter: Payer: Self-pay | Admitting: Hematology and Oncology

## 2020-09-04 NOTE — Progress Notes (Addendum)
Patient Care Team: Jonathon Jordan, MD as PCP - General (Family Medicine) Nicholas Lose, MD as Consulting Physician (Hematology and Oncology) Kyung Rudd, MD as Consulting Physician (Radiation Oncology) Excell Seltzer, MD (Inactive) as Consulting Physician (General Surgery)  DIAGNOSIS:    ICD-10-CM   1. Malignant neoplasm of upper-inner quadrant of left breast in female, estrogen receptor positive ([name])  C50.212 CBC with Differential ([place] Only)   Z17.0 [place] ([place] only)    SUMMARY OF ONCOLOGIC HISTORY: Oncology History  Malignant neoplasm of upper-inner quadrant of left breast in female, estrogen receptor positive ([name])  07/24/2017 Initial Diagnosis   Left breast palpable lump upper inner quadrant 11 o'clock position: Ill-defined 1.6 cm mass, axilla ultrasound negative, ultrasound-guided biopsy: Grade 2 IDC ER 90%, PR 40%, Ki-67 15%, HER-2 negative   08/09/2017 Genetic Testing   Negative genetic testing common hereditary cancer panel.  The Hereditary Gene Panel offered by [facility] includes sequencing and/or deletion duplication testing of the following 47 genes: APC, ATM, AXIN2, BARD1, BMPR1A, BRCA1, BRCA2, BRIP1, CDH1, CDK4, CDKN2A (p14ARF), CDKN2A (p16INK4a), CHEK2, CTNNA1, DICER1, EPCAM (Deletion/duplication testing only), GREM1 (promoter region deletion/duplication testing only), KIT, MEN1, MLH1, MSH2, MSH3, MSH6, MUTYH, NBN, NF1, NHTL1, PALB2, PDGFRA, PMS2, POLD1, POLE, PTEN, RAD50, RAD51C, RAD51D, SDHB, SDHC, SDHD, SMAD4, SMARCA4. STK11, TP53, TSC1, TSC2, and VHL.  The following genes were evaluated for sequence changes only: SDHA and HOXB13 c.251G>A variant only. The report date is August 07, 2017.   08/16/2017 Surgery   Left lumpectomy: IDC grade 2, 2.5 cm, DCIS intermediate grade, lymphovascular invasion identified, margins negative, 1/2 lymph nodes positive with extracapsular extension, ER 90%, PR 40%, HER-2 negative ratio 1.15, Ki-67 15% T2 N1a stage II a;  Mammaprint high risk    08/16/2017 Miscellaneous   MammaPrint 08/16/17  High risk with average 10-year Risk of recurrence Untreated: 29% MPI: -0.399 94.6% benefit of chemotherapy and Hormonal therapy at 5 years from distant recurrence.    09/12/2017 - 01/31/2018 Chemotherapy   Adjuvant chemotherapy with dose dense Adriamycin and Cytoxan x4 followed by Taxol weekly x12   02/13/2018 Cancer Staging   Staging form: Breast, AJCC 8th Edition - Pathologic: Stage IB (pT2, pN1a(sn), cM0, G2, ER+, PR+, HER2-) - Signed by Nicholas Lose, MD on 07/20/2020 Neoadjuvant therapy: No Method of lymph node assessment: Sentinel lymph node biopsy Multigene prognostic tests performed: MammaPrint Histologic grading system: 3 grade system Laterality: Left Tumor size (mm): 25   02/24/2018 - 04/09/2018 Radiation Therapy   Radiation therapy with Dr. Lisbeth Renshaw 02/24/18-04/09/18   05/13/2018 Surgery   Bilateral salpingo-oophorectomy with lysis of left adnexal adhesions   05/28/2018 -  Anti-estrogen oral therapy   Anastrozole: Discontinued due to joint pains and stiffness, switched to letrozole 07/2018   08/22/2018 Miscellaneous   Abemaciclib started 08/21/2020 dose reduced 09/05/2020     CHIEF COMPLIANT: Follow-up of left breast cancer on letrozole and abemaciclib   INTERVAL HISTORY: [name] is a 40 y.o. with above-mentioned history of left breast cancer treated with lumpectomy,adjuvant chemotherapy,radiation and bilateral salpingo-oophorectomy. She is currently on anti-estrogen therapy with letrozole and abemaciclib.She presentsto the clinic today for follow-up Verzenio is causing her diarrhea but can be 3-4 times a day.  Some days it is accompanied by severe cramps.  She has not been taking Imodium as we recommended because she does not like taking additional medications.  She is keeping up with hydration.  ALLERGIES:  has no allergies on file.  MEDICATIONS:  Current Outpatient Medications  Medication Sig  Dispense  Refill  . abemaciclib (VERZENIO) 100 MG tablet Take 1 tablet (100 mg total) by mouth 2 (two) times daily. 60 tablet 3  . ALPRAZolam (XANAX) 0.25 MG tablet Take 1 tablet (0.25 mg total) by mouth at bedtime as needed for anxiety. 30 tablet 3  . escitalopram (LEXAPRO) 20 MG tablet Take 1 tablet (20 mg total) by mouth daily with supper. 90 tablet 3  . letrozole (FEMARA) 2.5 MG tablet Take 1 tablet (2.5 mg total) by mouth daily. 90 tablet 3  . valACYclovir (VALTREX) 1000 MG tablet Take 1 tablet (1,000 mg total) by mouth 2 (two) times daily. 30 tablet 1   No current facility-administered medications for this visit.   Facility-Administered Medications Ordered in Other Visits  Medication Dose Route Frequency Provider Last Rate Last Admin  . heparin lock flush 100 unit/mL  500 Units Intracatheter Once PRN Nicholas Lose, MD      . sodium chloride flush (NS) 0.9 % injection 10 mL  10 mL Intracatheter PRN Nicholas Lose, MD   10 mL at 11/15/17 1449  . sodium chloride flush (NS) 0.9 % injection 10 mL  10 mL Intracatheter PRN Nicholas Lose, MD      . sodium chloride flush (NS) 0.9 % injection 10 mL  10 mL Intracatheter PRN Nicholas Lose, MD   10 mL at 12/13/17 1500    PHYSICAL EXAMINATION: ECOG PERFORMANCE STATUS: 1 - Symptomatic but completely ambulatory  Vitals:   09/05/20 0903  BP: 106/74  Pulse: 61  Resp: 18  Temp: 97.7 F (36.5 C)  SpO2: 100%   Filed Weights   09/05/20 0903  Weight: 205 lb (93 kg)       LABORATORY DATA:  I have reviewed the data as listed CMP Latest Ref Rng & Units 07/20/2020 01/01/2019 01/31/2018  Glucose 70 - 99 mg/dL 94 92 141(H)  BUN 6 - 20 mg/dL $Remove'15 19 13  'SYHXmfk$ Creatinine 0.44 - 1.00 mg/dL 0.96 0.72 0.83  Sodium 135 - 145 mmol/L 140 139 140  Potassium 3.5 - 5.1 mmol/L 4.2 3.8 3.9  Chloride 98 - 111 mmol/L 105 107 106  CO2 22 - 32 mmol/L $RemoveB'27 23 24  'wHIhOKmm$ Calcium 8.9 - 10.3 mg/dL 9.3 9.0 8.8(L)  Total Protein 6.5 - 8.1 g/dL 7.2 - 6.4(L)  Total Bilirubin 0.3 - 1.2  mg/dL 0.7 - 0.5  Alkaline Phos 38 - 126 U/L 100 - 76  AST 15 - 41 U/L 18 - 32  ALT 0 - 44 U/L 12 - 45(H)    Lab Results  Component Value Date   WBC 4.0 09/05/2020   HGB 11.9 (L) 09/05/2020   HCT 35.4 (L) 09/05/2020   MCV 87.8 09/05/2020   PLT 159 09/05/2020   NEUTROABS 2.0 09/05/2020    ASSESSMENT & PLAN:  Malignant neoplasm of upper-inner quadrant of left breast in female, estrogen receptor positive ([facility]) 08/19/2017:Left lumpectomy: IDC grade 2, 2.5 cm, DCIS intermediate grade, lymphovascular invasion identified, margins negative, 1/2 lymph nodes positive with extracapsular extension, ER 90%, PR 40%, HER-2 negative ratio 1.15, Ki-67 15% T2 N1a stage II a; Mammaprint high risk   Recommendation: 1. Adjuvant chemotherapy with dose dense Adriamycin and Cytoxan x4 followed by Taxol weekly x12 completed 01/31/2018 2. followed by adjuvant radiation therapywhich will be completed 04/09/2018 3.Bilateral salpingo-oophorectomy 05/15/2018 4.Followed by adjuvant antiestrogen therapy;will enroll the patient in [facility] clinical trial _______________________________________________________________________________________________  Surveillance:Breast MRI August 2020 with sedation(I did not see any contraindications for sedation.)  Current treatment:Anastrozole 1 mg daily started 06/02/2018 discontinued  07/02/2018 due to diffuse muscle aches and pains,switched to letrozole 07/14/2018 Recommended adding abemaciclib based on clinical trial [facility]  Letrozole toxicities:Occasional hot flashes and mild muscle stiffness but significant improvement from anastrozole.  Arthralgias/myalgias:Much improved on letrozole. Ulnar nerve neuropathy:Taking turmeric Bone density 07/01/2018: Normal T score 0.1  Breast cancer surveillance: 1.Breast MRI 07/18/2020: Benign breast density category B 2.Breast exam 07/20/2020: Benign 3.Mammogram  at [facility]: Benign, breast density category C    Abemaciclib Toxicities: 1.  Diarrhea: With cramps, we will reduce the dosage of Verzenio to 100 mg twice daily.  I sent a new prescription to her pharmacy. 2. slight anemia: Monitoring  Return to clinic in 4 weeks with labs and follow-up     Orders Placed This Encounter  Procedures  . CBC with Differential ([facility]cer [facility] Only)    Standing Status:   Future    Standing Expiration Date:   09/05/2021  . CMP ([place] only)    Standing Status:   Future    Standing Expiration Date:   09/05/2021   The patient has a good understanding of the overall plan. she agrees with it. she will call with any problems that may develop before the next visit here.  Total time spent: 30 mins including face to face time and time spent for planning, charting and coordination of care  Rulon Eisenmenger, MD, MPH 09/05/2020  I, Molly Dorshimer, am acting as scribe for Dr. Nicholas Lose.  I have reviewed the above documentation for accuracy and completeness, and I agree with the above.

## 2020-09-04 NOTE — Assessment & Plan Note (Signed)
08/19/2017:Left lumpectomy: IDC grade 2, 2.5 cm, DCIS intermediate grade, lymphovascular invasion identified, margins negative, 1/2 lymph nodes positive with extracapsular extension, ER 90%, PR 40%, HER-2 negative ratio 1.15, Ki-67 15% T2 N1a stage II a; Mammaprint high risk   Recommendation: 1. Adjuvant chemotherapy with dose dense Adriamycin and Cytoxan x4 followed by Taxol weekly x12 completed 01/31/2018 2. followed by adjuvant radiation therapywhich will be completed 04/09/2018 3.Bilateral salpingo-oophorectomy 05/15/2018 4.Followed by adjuvant antiestrogen therapy;will enroll the patient in [facility] clinical trial _______________________________________________________________________________________________  Surveillance:Breast MRI August 2020 with sedation(I did not see any contraindications for sedation.)  Current treatment:Anastrozole 1 mg daily started 06/02/2018 discontinued 07/02/2018 due to diffuse muscle aches and pains,switched to letrozole 07/14/2018 Recommended adding abemaciclib based on clinical trial [facility]  Letrozole toxicities:Occasional hot flashes and mild muscle stiffness but significant improvement from anastrozole.  Arthralgias/myalgias:Much improved on letrozole. Ulnar nerve neuropathy:Taking turmeric Bone density 07/01/2018: Normal T score 0.1  Breast cancer surveillance: 1.Breast MRI 07/18/2020: Benign breast density category B 2.Breast exam 07/20/2020: Benign 3.Mammogram  at [facility]: Benign, breast density category C   Abemaciclib Toxicities:

## 2020-09-05 ENCOUNTER — Other Ambulatory Visit (HOSPITAL_COMMUNITY): Payer: Self-pay

## 2020-09-05 ENCOUNTER — Inpatient Hospital Stay: Payer: Commercial Managed Care - PPO | Admitting: Hematology and Oncology

## 2020-09-05 ENCOUNTER — Inpatient Hospital Stay: Payer: Commercial Managed Care - PPO | Attending: Hematology and Oncology

## 2020-09-05 ENCOUNTER — Other Ambulatory Visit: Payer: Self-pay

## 2020-09-05 ENCOUNTER — Other Ambulatory Visit: Payer: Self-pay | Admitting: Pharmacist

## 2020-09-05 DIAGNOSIS — Z170 Estrogen receptor positive status [ER+]: Secondary | ICD-10-CM | POA: Insufficient documentation

## 2020-09-05 DIAGNOSIS — C50212 Malignant neoplasm of upper-inner quadrant of left female breast: Secondary | ICD-10-CM

## 2020-09-05 DIAGNOSIS — R197 Diarrhea, unspecified: Secondary | ICD-10-CM | POA: Diagnosis not present

## 2020-09-05 LAB — CBC WITH DIFFERENTIAL (CANCER CENTER ONLY)
Abs Immature Granulocytes: 0 10*3/uL (ref 0.00–0.07)
Basophils Absolute: 0.1 10*3/uL (ref 0.0–0.1)
Basophils Relative: 1 %
Eosinophils Absolute: 0.1 10*3/uL (ref 0.0–0.5)
Eosinophils Relative: 3 %
HCT: 35.4 % — ABNORMAL LOW (ref 36.0–46.0)
Hemoglobin: 11.9 g/dL — ABNORMAL LOW (ref 12.0–15.0)
Immature Granulocytes: 0 %
Lymphocytes Relative: 42 %
Lymphs Abs: 1.7 10*3/uL (ref 0.7–4.0)
MCH: 29.5 pg (ref 26.0–34.0)
MCHC: 33.6 g/dL (ref 30.0–36.0)
MCV: 87.8 fL (ref 80.0–100.0)
Monocytes Absolute: 0.2 10*3/uL (ref 0.1–1.0)
Monocytes Relative: 5 %
Neutro Abs: 2 10*3/uL (ref 1.7–7.7)
Neutrophils Relative %: 49 %
Platelet Count: 159 10*3/uL (ref 150–400)
RBC: 4.03 MIL/uL (ref 3.87–5.11)
RDW: 12.2 % (ref 11.5–15.5)
WBC Count: 4 10*3/uL (ref 4.0–10.5)
nRBC: 0 % (ref 0.0–0.2)

## 2020-09-05 LAB — CMP (CANCER CENTER ONLY)
ALT: 10 U/L (ref 0–44)
AST: 14 U/L — ABNORMAL LOW (ref 15–41)
Albumin: 4 g/dL (ref 3.5–5.0)
Alkaline Phosphatase: 100 U/L (ref 38–126)
Anion gap: 11 (ref 5–15)
BUN: 17 mg/dL (ref 6–20)
CO2: 23 mmol/L (ref 22–32)
Calcium: 9.1 mg/dL (ref 8.9–10.3)
Chloride: 107 mmol/L (ref 98–111)
Creatinine: 1.21 mg/dL — ABNORMAL HIGH (ref 0.44–1.00)
GFR, Estimated: 58 mL/min — ABNORMAL LOW (ref >60)
Glucose, Bld: 91 mg/dL (ref 70–99)
Potassium: 4 mmol/L (ref 3.5–5.1)
Sodium: 141 mmol/L (ref 135–145)
Total Bilirubin: 0.5 mg/dL (ref 0.3–1.2)
Total Protein: 6.7 g/dL (ref 6.5–8.1)

## 2020-09-05 MED ORDER — ABEMACICLIB 100 MG PO TABS: 100.0000 mg | ORAL_TABLET | Freq: Two times a day (BID) | ORAL | 3 refills | Status: DC

## 2020-09-05 MED ORDER — ABEMACICLIB 100 MG PO TABS
100.0000 mg | ORAL_TABLET | Freq: Two times a day (BID) | ORAL | 3 refills | Status: DC
  Filled 2020-09-05: qty 60, 30d supply, fill #0

## 2020-09-05 NOTE — Progress Notes (Signed)
Oral Oncology Pharmacist Encounter  Prescription refill for Verzenio (abemaciclib) sent to [facility] in error. Patient enrolled in manufacturer assistance and receives medication through [facility]. Prescription redirected to [facility].  Leron Croak, PharmD, BCPS Hematology/Oncology Clinical Pharmacist [facility] [phone] 09/05/2020 9:29 AM

## 2020-09-26 ENCOUNTER — Ambulatory Visit: Payer: Commercial Managed Care - PPO | Admitting: Hematology and Oncology

## 2020-09-26 ENCOUNTER — Other Ambulatory Visit: Payer: Commercial Managed Care - PPO

## 2020-10-02 NOTE — Progress Notes (Signed)
Patient Care Team: Jonathon Jordan, MD as PCP - General (Family Medicine) Nicholas Lose, MD as Consulting Physician (Hematology and Oncology) Kyung Rudd, MD as Consulting Physician (Radiation Oncology) Excell Seltzer, MD (Inactive) as Consulting Physician (General Surgery)  DIAGNOSIS:    ICD-10-CM   1. Malignant neoplasm of upper-inner quadrant of left breast in female, estrogen receptor positive ([place])  C50.212    Z17.0     SUMMARY OF ONCOLOGIC HISTORY: Oncology History  Malignant neoplasm of upper-inner quadrant of left breast in female, estrogen receptor positive ([place])  07/24/2017 Initial Diagnosis   Left breast palpable lump upper inner quadrant 11 o'clock position: Ill-defined 1.6 cm mass, axilla ultrasound negative, ultrasound-guided biopsy: Grade 2 IDC ER 90%, PR 40%, Ki-67 15%, HER-2 negative   08/09/2017 Genetic Testing   Negative genetic testing common hereditary cancer panel.  The Hereditary Gene Panel offered by [facility] includes sequencing and/or deletion duplication testing of the following 47 genes: APC, ATM, AXIN2, BARD1, BMPR1A, BRCA1, BRCA2, BRIP1, CDH1, CDK4, CDKN2A (p14ARF), CDKN2A (p16INK4a), CHEK2, CTNNA1, DICER1, EPCAM (Deletion/duplication testing only), GREM1 (promoter region deletion/duplication testing only), KIT, MEN1, MLH1, MSH2, MSH3, MSH6, MUTYH, NBN, NF1, NHTL1, PALB2, PDGFRA, PMS2, POLD1, POLE, PTEN, RAD50, RAD51C, RAD51D, SDHB, SDHC, SDHD, SMAD4, SMARCA4. STK11, TP53, TSC1, TSC2, and VHL.  The following genes were evaluated for sequence changes only: SDHA and HOXB13 c.251G>A variant only. The report date is August 07, 2017.   08/16/2017 Surgery   Left lumpectomy: IDC grade 2, 2.5 cm, DCIS intermediate grade, lymphovascular invasion identified, margins negative, 1/2 lymph nodes positive with extracapsular extension, ER 90%, PR 40%, HER-2 negative ratio 1.15, Ki-67 15% T2 N1a stage II a; Mammaprint high risk    08/16/2017 Miscellaneous   MammaPrint 08/16/17   High risk with average 10-year Risk of recurrence Untreated: 29% MPI: -0.399 94.6% benefit of chemotherapy and Hormonal therapy at 5 years from distant recurrence.    09/12/2017 - 01/31/2018 Chemotherapy   Adjuvant chemotherapy with dose dense Adriamycin and Cytoxan x4 followed by Taxol weekly x12   02/13/2018 Cancer Staging   Staging form: Breast, AJCC 8th Edition - Pathologic: Stage IB (pT2, pN1a(sn), cM0, G2, ER+, PR+, HER2-) - Signed by Nicholas Lose, MD on 07/20/2020 Neoadjuvant therapy: No Method of lymph node assessment: Sentinel lymph node biopsy Multigene prognostic tests performed: MammaPrint Histologic grading system: 3 grade system Laterality: Left Tumor size (mm): 25   02/24/2018 - 04/09/2018 Radiation Therapy   Radiation therapy with Dr. Lisbeth Renshaw 02/24/18-04/09/18   05/13/2018 Surgery   Bilateral salpingo-oophorectomy with lysis of left adnexal adhesions   05/28/2018 -  Anti-estrogen oral therapy   Anastrozole: Discontinued due to joint pains and stiffness, switched to letrozole 07/2018   08/22/2018 Miscellaneous   Abemaciclib started 08/21/2020 dose reduced 09/05/2020     CHIEF COMPLIANT: Follow-up of left breast cancer on letrozole and abemaciclib  INTERVAL HISTORY: [name] is a 40 y.o. with above-mentioned history ofleft breast cancer treated with lumpectomy,adjuvant chemotherapy,radiation and bilateral salpingo-oophorectomy. She is currently on anti-estrogen therapy with letrozole and abemaciclib.She presentsto the clinic today for follow-up.  Since we reduce the dosage of abemaciclib, the diarrhea has improved but abdominal cramps persisted.  She gets a very severe cramp which further leads to one episode of diarrhea per day.  She gets it almost every day at bedtime.  ALLERGIES:  has no allergies on file.  MEDICATIONS:  Current Outpatient Medications  Medication Sig Dispense Refill  . abemaciclib (VERZENIO) 100 MG tablet Take 1 tablet (100 mg total) by  mouth  2 (two) times daily. 60 tablet 3  . ALPRAZolam (XANAX) 0.25 MG tablet Take 1 tablet (0.25 mg total) by mouth at bedtime as needed for anxiety. 30 tablet 3  . escitalopram (LEXAPRO) 20 MG tablet Take 1 tablet (20 mg total) by mouth daily with supper. 90 tablet 3  . letrozole (FEMARA) 2.5 MG tablet Take 1 tablet (2.5 mg total) by mouth daily. 90 tablet 3  . valACYclovir (VALTREX) 1000 MG tablet Take 1 tablet (1,000 mg total) by mouth 2 (two) times daily. 30 tablet 1   No current facility-administered medications for this visit.   Facility-Administered Medications Ordered in Other Visits  Medication Dose Route Frequency Provider Last Rate Last Admin  . heparin lock flush 100 unit/mL  500 Units Intracatheter Once PRN Nicholas Lose, MD      . sodium chloride flush (NS) 0.9 % injection 10 mL  10 mL Intracatheter PRN Nicholas Lose, MD   10 mL at 11/15/17 1449  . sodium chloride flush (NS) 0.9 % injection 10 mL  10 mL Intracatheter PRN Nicholas Lose, MD      . sodium chloride flush (NS) 0.9 % injection 10 mL  10 mL Intracatheter PRN Nicholas Lose, MD   10 mL at 12/13/17 1500    PHYSICAL EXAMINATION: ECOG PERFORMANCE STATUS: 1 - Symptomatic but completely ambulatory  Vitals:   10/03/20 1431  BP: 119/66  Pulse: (!) 58  Resp: 16  Temp: (!) 97.5 F (36.4 C)  SpO2: 100%   Filed Weights   10/03/20 1431  Weight: 202 lb 6.4 oz (91.8 kg)    LABORATORY DATA:  I have reviewed the data as listed CMP Latest Ref Rng & Units 09/05/2020 07/20/2020 01/01/2019  Glucose 70 - 99 mg/dL 91 94 92  BUN 6 - 20 mg/dL _0 Creatinine 0.44 - 1.00 mg/dL 1.21(H) 0.96 0.72  Sodium 135 - 145 mmol/L 141 140 139  Potassium 3.5 - 5.1 mmol/L 4.0 4.2 3.8  Chloride 98 - 111 mmol/L 107 105 107  CO2 22 - 32 mmol/L _1 Calcium 8.9 - 10.3 mg/dL 9.1 9.3 9.0  Total Protein 6.5 - 8.1 g/dL 6.7 7.2 -  Total Bilirubin 0.3 - 1.2 mg/dL 0.5 0.7 -  Alkaline Phos 38 - 126 U/L 100 100 -  AST 15 - 41 U/L 14(L) 18 -  ALT 0 -  44 U/L 10 12 -    Lab Results  Component Value Date   WBC 4.3 10/03/2020   HGB 11.2 (L) 10/03/2020   HCT 31.7 (L) 10/03/2020   MCV 87.8 10/03/2020   PLT 184 10/03/2020   NEUTROABS 2.3 10/03/2020    ASSESSMENT & PLAN:  Malignant neoplasm of upper-inner quadrant of left breast in female, estrogen receptor positive ([place]) 08/19/2017:Left lumpectomy: IDC grade 2, 2.5 cm, DCIS intermediate grade, lymphovascular invasion identified, margins negative, 1/2 lymph nodes positive with extracapsular extension, ER 90%, PR 40%, HER-2 negative ratio 1.15, Ki-67 15% T2 N1a stage II a; Mammaprint high risk   Recommendation: 1. Adjuvant chemotherapy with dose dense Adriamycin and Cytoxan x4 followed by Taxol weekly x12 completed 01/31/2018 2. followed by adjuvant radiation therapywhich will be completed 04/09/2018 3.Bilateral salpingo-oophorectomy 05/15/2018 4.Followed by adjuvant antiestrogen therapy;will enroll the patient in [facility] clinical trial _______________________________________________________________________________________________  Current treatment:Anastrozole 1 mg daily started 06/02/2018 discontinued 07/02/2018 due to diffuse muscle aches and pains,switched to letrozole 07/14/2018 Abemaciclib based on clinical trial [facility]  Letrozole toxicities:Occasional hot flashes and mild muscle stiffness  but significant improvement from anastrozole.  Arthralgias/myalgias:Much improved on letrozole. Ulnar nerve neuropathy:Taking turmeric Bone density 07/01/2018: Normal T score 0.1  Breast cancer surveillance: 1.Breast MRI2/21/2022:Benign breast density category B 2.Breast exam2/23/2022: Benign 3.[facility]ogram  at [facility]: Benign, breast density category C  Abemaciclib Toxicities: 1.  Diarrhea: With cramps, we reduced the dosage of Verzenio to 100 mg twice daily. 2. slight anemia: Monitoring 3.  Abdominal cramps: We will send a prescription for baclofen. 4.  Renal  insufficiency: We will be monitoring closely.  Return to clinic in 4 weeks with labs and follow-up    No orders of the defined types were placed in this encounter.  The patient has a good understanding of the overall plan. she agrees with it. she will call with any problems that may develop before the next visit here.  Total time spent: 30 mins including face to face time and time spent for planning, charting and coordination of care  Rulon Eisenmenger, MD, MPH 10/03/2020  I, Cloyde Reams Dorshimer, am acting as scribe for Dr. Nicholas Lose.  I have reviewed the above documentation for accuracy and completeness, and I agree with the above.

## 2020-10-03 ENCOUNTER — Telehealth: Payer: Self-pay | Admitting: Hematology and Oncology

## 2020-10-03 ENCOUNTER — Inpatient Hospital Stay (HOSPITAL_BASED_OUTPATIENT_CLINIC_OR_DEPARTMENT_OTHER): Payer: Commercial Managed Care - PPO | Admitting: Hematology and Oncology

## 2020-10-03 ENCOUNTER — Other Ambulatory Visit: Payer: Self-pay

## 2020-10-03 ENCOUNTER — Inpatient Hospital Stay: Payer: Commercial Managed Care - PPO | Attending: Hematology and Oncology

## 2020-10-03 DIAGNOSIS — C50212 Malignant neoplasm of upper-inner quadrant of left female breast: Secondary | ICD-10-CM

## 2020-10-03 DIAGNOSIS — Z79811 Long term (current) use of aromatase inhibitors: Secondary | ICD-10-CM | POA: Diagnosis not present

## 2020-10-03 DIAGNOSIS — Z79899 Other long term (current) drug therapy: Secondary | ICD-10-CM | POA: Insufficient documentation

## 2020-10-03 DIAGNOSIS — Z923 Personal history of irradiation: Secondary | ICD-10-CM | POA: Insufficient documentation

## 2020-10-03 DIAGNOSIS — Z9221 Personal history of antineoplastic chemotherapy: Secondary | ICD-10-CM | POA: Diagnosis not present

## 2020-10-03 DIAGNOSIS — Z170 Estrogen receptor positive status [ER+]: Secondary | ICD-10-CM | POA: Insufficient documentation

## 2020-10-03 LAB — CBC WITH DIFFERENTIAL (CANCER CENTER ONLY)
Abs Immature Granulocytes: 0 10*3/uL (ref 0.00–0.07)
Basophils Absolute: 0.1 10*3/uL (ref 0.0–0.1)
Basophils Relative: 2 %
Eosinophils Absolute: 0 10*3/uL (ref 0.0–0.5)
Eosinophils Relative: 1 %
HCT: 31.7 % — ABNORMAL LOW (ref 36.0–46.0)
Hemoglobin: 11.2 g/dL — ABNORMAL LOW (ref 12.0–15.0)
Immature Granulocytes: 0 %
Lymphocytes Relative: 38 %
Lymphs Abs: 1.6 10*3/uL (ref 0.7–4.0)
MCH: 31 pg (ref 26.0–34.0)
MCHC: 35.3 g/dL (ref 30.0–36.0)
MCV: 87.8 fL (ref 80.0–100.0)
Monocytes Absolute: 0.3 10*3/uL (ref 0.1–1.0)
Monocytes Relative: 6 %
Neutro Abs: 2.3 10*3/uL (ref 1.7–7.7)
Neutrophils Relative %: 53 %
Platelet Count: 184 10*3/uL (ref 150–400)
RBC: 3.61 MIL/uL — ABNORMAL LOW (ref 3.87–5.11)
RDW: 13.7 % (ref 11.5–15.5)
WBC Count: 4.3 10*3/uL (ref 4.0–10.5)
nRBC: 0 % (ref 0.0–0.2)

## 2020-10-03 LAB — CMP (CANCER CENTER ONLY)
ALT: 8 U/L (ref 0–44)
AST: 14 U/L — ABNORMAL LOW (ref 15–41)
Albumin: 3.9 g/dL (ref 3.5–5.0)
Alkaline Phosphatase: 85 U/L (ref 38–126)
Anion gap: 10 (ref 5–15)
BUN: 18 mg/dL (ref 6–20)
CO2: 26 mmol/L (ref 22–32)
Calcium: 9.1 mg/dL (ref 8.9–10.3)
Chloride: 106 mmol/L (ref 98–111)
Creatinine: 1.33 mg/dL — ABNORMAL HIGH (ref 0.44–1.00)
GFR, Estimated: 52 mL/min — ABNORMAL LOW (ref >60)
Glucose, Bld: 86 mg/dL (ref 70–99)
Potassium: 4 mmol/L (ref 3.5–5.1)
Sodium: 142 mmol/L (ref 135–145)
Total Bilirubin: 0.4 mg/dL (ref 0.3–1.2)
Total Protein: 6.6 g/dL (ref 6.5–8.1)

## 2020-10-03 MED ORDER — BACLOFEN 10 MG PO TABS
10.0000 mg | ORAL_TABLET | Freq: Three times a day (TID) | ORAL | 1 refills | Status: DC | PRN
Start: 2020-10-03 — End: 2022-07-09

## 2020-10-03 NOTE — Telephone Encounter (Signed)
Scheduled per los. Confirmed appts. Declined printout  

## 2020-10-03 NOTE — Assessment & Plan Note (Addendum)
08/19/2017:Left lumpectomy: IDC grade 2, 2.5 cm, DCIS intermediate grade, lymphovascular invasion identified, margins negative, 1/2 lymph nodes positive with extracapsular extension, ER 90%, PR 40%, HER-2 negative ratio 1.15, Ki-67 15% T2 N1a stage II a; Mammaprint high risk   Recommendation: 1. Adjuvant chemotherapy with dose dense Adriamycin and Cytoxan x4 followed by Taxol weekly x12 completed 01/31/2018 2. followed by adjuvant radiation therapywhich will be completed 04/09/2018 3.Bilateral salpingo-oophorectomy 05/15/2018 4.Followed by adjuvant antiestrogen therapy;will enroll the patient in [facility] clinical trial _______________________________________________________________________________________________  Current treatment:Anastrozole 1 mg daily started 06/02/2018 discontinued 07/02/2018 due to diffuse muscle aches and pains,switched to letrozole 07/14/2018 Abemaciclib based on clinical trial [facility]  Letrozole toxicities:Occasional hot flashes and mild muscle stiffness but significant improvement from anastrozole.  Arthralgias/myalgias:Much improved on letrozole. Ulnar nerve neuropathy:Taking turmeric Bone density 07/01/2018: Normal T score 0.1  Breast cancer surveillance: 1.Breast MRI2/21/2022:Benign breast density category B 2.Breast exam2/23/2022: Benign 3.Mammogram  at [facility]: Benign, breast density category C  Abemaciclib Toxicities: 1.  Diarrhea: With cramps, we reduced the dosage of Verzenio to 100 mg twice daily. 2. slight anemia: Monitoring  Return to clinic in 4 weeks with labs and follow-up

## 2020-10-03 NOTE — Telephone Encounter (Signed)
I called the patient with a creatinine of 1.33. I am concerned about acute kidney injury from abemaciclib. I would like her to stop abemaciclib for 2 weeks Recheck labs in 2 weeks and follow-up.

## 2020-10-04 ENCOUNTER — Telehealth: Payer: Self-pay | Admitting: Hematology and Oncology

## 2020-10-04 NOTE — Telephone Encounter (Signed)
Scheduled appts per 5/9 sch msg. Pt aware.  

## 2020-10-16 NOTE — Assessment & Plan Note (Signed)
08/19/2017:Left lumpectomy: IDC grade 2, 2.5 cm, DCIS intermediate grade, lymphovascular invasion identified, margins negative, 1/2 lymph nodes positive with extracapsular extension, ER 90%, PR 40%, HER-2 negative ratio 1.15, Ki-67 15% T2 N1a stage II a; Mammaprint high risk   Recommendation: 1. Adjuvant chemotherapy with dose dense Adriamycin and Cytoxan x4 followed by Taxol weekly x12 completed 01/31/2018 2. followed by adjuvant radiation therapywhich will be completed 04/09/2018 3.Bilateral salpingo-oophorectomy 05/15/2018 4.Followed by adjuvant antiestrogen therapy;will enroll the patient in [facility] clinical trial _______________________________________________________________________________________________  Current treatment:Anastrozole 1 mg daily started 06/02/2018 discontinued 07/02/2018 due to diffuse muscle aches and pains,switched to letrozole 07/14/2018 Abemaciclib based on clinical trial [facility]  Letrozole toxicities:Occasional hot flashes and mild muscle stiffness but significant improvement from anastrozole.  Arthralgias/myalgias:Much improved on letrozole. Ulnar nerve neuropathy:Taking turmeric Bone density 07/01/2018: Normal T score 0.1  Breast cancer surveillance: 1.Breast MRI2/21/2022:Benign breast density category B 2.Breast exam2/23/2022: Benign 3.Mammogram at [facility]: Benign, breast density category C  AbemaciclibToxicities: 1.Diarrhea: With cramps, we reduced the dosage of Verzenio to 100 mg twice daily. 2.slight anemia: Monitoring 3.  Abdominal cramps: We will send a prescription for baclofen. 4.  Renal insufficiency: Held [name]   Return to clinic in 4 weeks with labs and follow-up

## 2020-10-17 ENCOUNTER — Inpatient Hospital Stay: Payer: Commercial Managed Care - PPO | Admitting: Hematology and Oncology

## 2020-10-17 ENCOUNTER — Inpatient Hospital Stay: Payer: Commercial Managed Care - PPO

## 2020-10-17 ENCOUNTER — Other Ambulatory Visit: Payer: Self-pay

## 2020-10-17 DIAGNOSIS — Z170 Estrogen receptor positive status [ER+]: Secondary | ICD-10-CM

## 2020-10-17 DIAGNOSIS — C50212 Malignant neoplasm of upper-inner quadrant of left female breast: Secondary | ICD-10-CM

## 2020-10-17 LAB — CBC WITH DIFFERENTIAL (CANCER CENTER ONLY)
Abs Immature Granulocytes: 0.05 10*3/uL (ref 0.00–0.07)
Basophils Absolute: 0.1 10*3/uL (ref 0.0–0.1)
Basophils Relative: 1 %
Eosinophils Absolute: 0.1 10*3/uL (ref 0.0–0.5)
Eosinophils Relative: 1 %
HCT: 32.7 % — ABNORMAL LOW (ref 36.0–46.0)
Hemoglobin: 11.6 g/dL — ABNORMAL LOW (ref 12.0–15.0)
Immature Granulocytes: 1 %
Lymphocytes Relative: 32 %
Lymphs Abs: 1.8 10*3/uL (ref 0.7–4.0)
MCH: 31.2 pg (ref 26.0–34.0)
MCHC: 35.5 g/dL (ref 30.0–36.0)
MCV: 87.9 fL (ref 80.0–100.0)
Monocytes Absolute: 0.4 10*3/uL (ref 0.1–1.0)
Monocytes Relative: 7 %
Neutro Abs: 3.3 10*3/uL (ref 1.7–7.7)
Neutrophils Relative %: 58 %
Platelet Count: 182 10*3/uL (ref 150–400)
RBC: 3.72 MIL/uL — ABNORMAL LOW (ref 3.87–5.11)
RDW: 13.9 % (ref 11.5–15.5)
WBC Count: 5.7 10*3/uL (ref 4.0–10.5)
nRBC: 0 % (ref 0.0–0.2)

## 2020-10-17 LAB — CMP (CANCER CENTER ONLY)
ALT: 7 U/L (ref 0–44)
AST: 15 U/L (ref 15–41)
Albumin: 3.8 g/dL (ref 3.5–5.0)
Alkaline Phosphatase: 86 U/L (ref 38–126)
Anion gap: 6 (ref 5–15)
BUN: 16 mg/dL (ref 6–20)
CO2: 28 mmol/L (ref 22–32)
Calcium: 9.3 mg/dL (ref 8.9–10.3)
Chloride: 106 mmol/L (ref 98–111)
Creatinine: 0.87 mg/dL (ref 0.44–1.00)
GFR, Estimated: >60 mL/min (ref >60)
Glucose, Bld: 87 mg/dL (ref 70–99)
Potassium: 4.1 mmol/L (ref 3.5–5.1)
Sodium: 140 mmol/L (ref 135–145)
Total Bilirubin: 0.4 mg/dL (ref 0.3–1.2)
Total Protein: 6.8 g/dL (ref 6.5–8.1)

## 2020-10-17 NOTE — Progress Notes (Signed)
Patient Care Team: Mila Palmer, MD as PCP - General (Family Medicine) Serena Croissant, MD as Consulting Physician (Hematology and Oncology) Dorothy Puffer, MD as Consulting Physician (Radiation Oncology) Glenna Fellows, MD (Inactive) as Consulting Physician (General Surgery)  DIAGNOSIS:    ICD-10-CM   1. Malignant neoplasm of upper-inner quadrant of left breast in female, estrogen receptor positive (HCC)  C50.212    Z17.0     SUMMARY OF ONCOLOGIC HISTORY: Oncology History  Malignant neoplasm of upper-inner quadrant of left breast in female, estrogen receptor positive (HCC)  07/24/2017 Initial Diagnosis   Left breast palpable lump upper inner quadrant 11 o'clock position: Ill-defined 1.6 cm mass, axilla ultrasound negative, ultrasound-guided biopsy: Grade 2 IDC ER 90%, PR 40%, Ki-67 15%, HER-2 negative   08/09/2017 Genetic Testing   Negative genetic testing common hereditary cancer panel.  The Hereditary Gene Panel offered by [facility] includes sequencing and/or deletion duplication testing of the following 47 genes: APC, ATM, AXIN2, BARD1, BMPR1A, BRCA1, BRCA2, BRIP1, CDH1, CDK4, CDKN2A (p14ARF), CDKN2A (p16INK4a), CHEK2, CTNNA1, DICER1, EPCAM (Deletion/duplication testing only), GREM1 (promoter region deletion/duplication testing only), KIT, MEN1, MLH1, MSH2, MSH3, MSH6, MUTYH, NBN, NF1, NHTL1, PALB2, PDGFRA, PMS2, POLD1, POLE, PTEN, RAD50, RAD51C, RAD51D, SDHB, SDHC, SDHD, SMAD4, SMARCA4. STK11, TP53, TSC1, TSC2, and VHL.  The following genes were evaluated for sequence changes only: SDHA and HOXB13 c.251G>A variant only. The report date is August 07, 2017.   08/16/2017 Surgery   Left lumpectomy: IDC grade 2, 2.5 cm, DCIS intermediate grade, lymphovascular invasion identified, margins negative, 1/2 lymph nodes positive with extracapsular extension, ER 90%, PR 40%, HER-2 negative ratio 1.15, Ki-67 15% T2 N1a stage II a; Mammaprint high risk    08/16/2017 Miscellaneous   MammaPrint 08/16/17   High risk with average 10-year Risk of recurrence Untreated: 29% MPI: -0.399 94.6% benefit of chemotherapy and Hormonal therapy at 5 years from distant recurrence.    09/12/2017 - 01/31/2018 Chemotherapy   Adjuvant chemotherapy with dose dense Adriamycin and Cytoxan x4 followed by Taxol weekly x12   02/13/2018 Cancer Staging   Staging form: Breast, AJCC 8th Edition - Pathologic: Stage IB (pT2, pN1a(sn), cM0, G2, ER+, PR+, HER2-) - Signed by Serena Croissant, MD on 07/20/2020 Neoadjuvant therapy: No Method of lymph node assessment: Sentinel lymph node biopsy Multigene prognostic tests performed: MammaPrint Histologic grading system: 3 grade system Laterality: Left Tumor size (mm): 25   02/24/2018 - 04/09/2018 Radiation Therapy   Radiation therapy with Dr. Mitzi Hansen 02/24/18-04/09/18   05/13/2018 Surgery   Bilateral salpingo-oophorectomy with lysis of left adnexal adhesions   05/28/2018 -  Anti-estrogen oral therapy   Anastrozole: Discontinued due to joint pains and stiffness, switched to letrozole 07/2018   08/22/2018 Miscellaneous   Abemaciclib started 08/21/2020 dose reduced 09/05/2020     CHIEF COMPLIANT: Follow-up of left breast cancer on letrozoleandabemaciclib  INTERVAL HISTORY: [name] is a 40 y.o. with above-mentioned history of left breast cancer treated with lumpectomy,adjuvant chemotherapy,radiation and bilateral salpingo-oophorectomy. She is currently on anti-estrogen therapy with letrozoleandabemaciclib, which was discontinued 2 weeks ago due to concern for acute kidney injury.She presentsto the clinic today for follow-up.   ALLERGIES:  has no allergies on file.  MEDICATIONS:  Current Outpatient Medications  Medication Sig Dispense Refill  . abemaciclib (VERZENIO) 100 MG tablet Take 1 tablet (100 mg total) by mouth 2 (two) times daily. 60 tablet 3  . ALPRAZolam (XANAX) 0.25 MG tablet Take 1 tablet (0.25 mg total) by mouth at bedtime as needed for anxiety. 30 tablet  3  . baclofen (LIORESAL) 10 MG tablet Take 1 tablet (10 mg total) by mouth 3 (three) times daily as needed for muscle spasms. 30 each 1  . escitalopram (LEXAPRO) 20 MG tablet Take 1 tablet (20 mg total) by mouth daily with supper. 90 tablet 3  . letrozole (FEMARA) 2.5 MG tablet Take 1 tablet (2.5 mg total) by mouth daily. 90 tablet 3  . valACYclovir (VALTREX) 1000 MG tablet Take 1 tablet (1,000 mg total) by mouth 2 (two) times daily. 30 tablet 1   No current facility-administered medications for this visit.   Facility-Administered Medications Ordered in Other Visits  Medication Dose Route Frequency Provider Last Rate Last Admin  . heparin lock flush 100 unit/mL  500 Units Intracatheter Once PRN Nicholas Lose, MD      . sodium chloride flush (NS) 0.9 % injection 10 mL  10 mL Intracatheter PRN Nicholas Lose, MD   10 mL at 11/15/17 1449  . sodium chloride flush (NS) 0.9 % injection 10 mL  10 mL Intracatheter PRN Nicholas Lose, MD      . sodium chloride flush (NS) 0.9 % injection 10 mL  10 mL Intracatheter PRN Nicholas Lose, MD   10 mL at 12/13/17 1500    PHYSICAL EXAMINATION: ECOG PERFORMANCE STATUS: 1 - Symptomatic but completely ambulatory  Vitals:   10/17/20 1510  BP: 115/72  Pulse: (!) 56  Resp: 18  Temp: (!) 97.5 F (36.4 C)  SpO2: 98%    LABORATORY DATA:  I have reviewed the data as listed CMP Latest Ref Rng & Units 10/17/2020 10/03/2020 09/05/2020  Glucose 70 - 99 mg/dL 87 86 91  BUN 6 - 20 mg/dL $Remove'16 18 17  'NeeBMfF$ Creatinine 0.44 - 1.00 mg/dL 0.87 1.33(H) 1.21(H)  Sodium 135 - 145 mmol/L 140 142 141  Potassium 3.5 - 5.1 mmol/L 4.1 4.0 4.0  Chloride 98 - 111 mmol/L 106 106 107  CO2 22 - 32 mmol/L $RemoveB'28 26 23  'dzYUJFGL$ Calcium 8.9 - 10.3 mg/dL 9.3 9.1 9.1  Total Protein 6.5 - 8.1 g/dL 6.8 6.6 6.7  Total Bilirubin 0.3 - 1.2 mg/dL 0.4 0.4 0.5  Alkaline Phos 38 - 126 U/L 86 85 100  AST 15 - 41 U/L 15 14(L) 14(L)  ALT 0 - 44 U/L $Remo'7 8 10    'YNwCg$ Lab Results  Component Value Date   WBC 5.7 10/17/2020    HGB 11.6 (L) 10/17/2020   HCT 32.7 (L) 10/17/2020   MCV 87.9 10/17/2020   PLT 182 10/17/2020   NEUTROABS 3.3 10/17/2020    ASSESSMENT & PLAN:  Malignant neoplasm of upper-inner quadrant of left breast in female, estrogen receptor positive (HCC) 08/19/2017:Left lumpectomy: IDC grade 2, 2.5 cm, DCIS intermediate grade, lymphovascular invasion identified, margins negative, 1/2 lymph nodes positive with extracapsular extension, ER 90%, PR 40%, HER-2 negative ratio 1.15, Ki-67 15% T2 N1a stage II a; Mammaprint high risk   Recommendation: 1. Adjuvant chemotherapy with dose dense Adriamycin and Cytoxan x4 followed by Taxol weekly x12 completed 01/31/2018 2. followed by adjuvant radiation therapywhich will be completed 04/09/2018 3.Bilateral salpingo-oophorectomy 05/15/2018 4.Followed by adjuvant antiestrogen therapy;will enroll the patient in [facility] clinical trial _______________________________________________________________________________________________  Current treatment:Anastrozole 1 mg daily started 06/02/2018 discontinued 07/02/2018 due to diffuse muscle aches and pains,switched to letrozole 07/14/2018 Abemaciclib based on clinical trial [facility]  Letrozole toxicities:Occasional hot flashes and mild muscle stiffness but significant improvement from anastrozole.  Arthralgias/myalgias:Much improved on letrozole. Ulnar nerve neuropathy:Taking turmeric Bone density 07/01/2018: Normal T score 0.1  Breast cancer surveillance: 1.Breast MRI2/21/2022:Benign breast density category B 2.Breast exam2/23/2022: Benign 3.Mammogram at [facility]: Benign, breast density category C  AbemaciclibToxicities: 1.Diarrhea: With cramps, improved with reducing the dosage of Verzenio to 100 mg twice daily. 2.slight anemia: Monitoring 3.  Abdominal cramps: She did not have to take baclofen since that resolved when she stopped it. 4.  Renal insufficiency: Improved after holding  Verzenio.  I instructed her to resume Verzenio at 100 mg p.o. twice daily.  Return to clinic in 2 weeks with labs and follow-up    No orders of the defined types were placed in this encounter.  The patient has a good understanding of the overall plan. she agrees with it. she will call with any problems that may develop before the next visit here.  Total time spent: 30 mins including face to face time and time spent for planning, charting and coordination of care  Rulon Eisenmenger, MD, MPH 10/17/2020  I, Molly Dorshimer, am acting as scribe for Dr. Nicholas Lose.  I have reviewed the above documentation for accuracy and completeness, and I agree with the above.

## 2020-10-30 NOTE — Progress Notes (Signed)
Patient Care Team: Jonathon Jordan, MD as PCP - General (Family Medicine) Nicholas Lose, MD as Consulting Physician (Hematology and Oncology) Kyung Rudd, MD as Consulting Physician (Radiation Oncology) Excell Seltzer, MD (Inactive) as Consulting Physician (General Surgery)  DIAGNOSIS:    ICD-10-CM   1. Malignant neoplasm of upper-inner quadrant of left breast in female, estrogen receptor positive ([place])  C50.212 CBC with Differential ([place] Only)   Z17.0 [place] ([place] only)    SUMMARY OF ONCOLOGIC HISTORY: Oncology History  Malignant neoplasm of upper-inner quadrant of left breast in female, estrogen receptor positive ([place])  07/24/2017 Initial Diagnosis   Left breast palpable lump upper inner quadrant 11 o'clock position: Ill-defined 1.6 cm mass, axilla ultrasound negative, ultrasound-guided biopsy: Grade 2 IDC ER 90%, PR 40%, Ki-67 15%, HER-2 negative   08/09/2017 Genetic Testing   Negative genetic testing common hereditary cancer panel.  The Hereditary Gene Panel offered by [facility] includes sequencing and/or deletion duplication testing of the following 47 genes: APC, ATM, AXIN2, BARD1, BMPR1A, BRCA1, BRCA2, BRIP1, CDH1, CDK4, CDKN2A (p14ARF), CDKN2A (p16INK4a), CHEK2, CTNNA1, DICER1, EPCAM (Deletion/duplication testing only), GREM1 (promoter region deletion/duplication testing only), KIT, MEN1, MLH1, MSH2, MSH3, MSH6, MUTYH, NBN, NF1, NHTL1, PALB2, PDGFRA, PMS2, POLD1, POLE, PTEN, RAD50, RAD51C, RAD51D, SDHB, SDHC, SDHD, SMAD4, SMARCA4. STK11, TP53, TSC1, TSC2, and VHL.  The following genes were evaluated for sequence changes only: SDHA and HOXB13 c.251G>A variant only. The report date is August 07, 2017.   08/16/2017 Surgery   Left lumpectomy: IDC grade 2, 2.5 cm, DCIS intermediate grade, lymphovascular invasion identified, margins negative, 1/2 lymph nodes positive with extracapsular extension, ER 90%, PR 40%, HER-2 negative ratio 1.15, Ki-67 15% T2 N1a stage II a;  Mammaprint high risk    08/16/2017 Miscellaneous   MammaPrint 08/16/17  High risk with average 10-year Risk of recurrence Untreated: 29% MPI: -0.399 94.6% benefit of chemotherapy and Hormonal therapy at 5 years from distant recurrence.    09/12/2017 - 01/31/2018 Chemotherapy   Adjuvant chemotherapy with dose dense Adriamycin and Cytoxan x4 followed by Taxol weekly x12   02/13/2018 Cancer Staging   Staging form: Breast, AJCC 8th Edition - Pathologic: Stage IB (pT2, pN1a(sn), cM0, G2, ER+, PR+, HER2-) - Signed by Nicholas Lose, MD on 07/20/2020 Neoadjuvant therapy: No Method of lymph node assessment: Sentinel lymph node biopsy Multigene prognostic tests performed: MammaPrint Histologic grading system: 3 grade system Laterality: Left Tumor size (mm): 25   02/24/2018 - 04/09/2018 Radiation Therapy   Radiation therapy with Dr. Lisbeth Renshaw 02/24/18-04/09/18   05/13/2018 Surgery   Bilateral salpingo-oophorectomy with lysis of left adnexal adhesions   05/28/2018 -  Anti-estrogen oral therapy   Anastrozole: Discontinued due to joint pains and stiffness, switched to letrozole 07/2018   08/22/2018 Miscellaneous   Abemaciclib started 08/21/2020 dose reduced 09/05/2020     CHIEF COMPLIANT: Follow-up of left breast cancer on letrozoleandabemaciclib  INTERVAL HISTORY: [name] is a 40 y.o. with above-mentioned history of left breast cancer treated with lumpectomy,adjuvant chemotherapy,radiation, and bilateral salpingo-oophorectomy. She is currently on anti-estrogen therapy with letrozoleandabemaciclib.She presentsto the clinic today for follow-up.   Patient feels that the diarrhea has subsided it is once every 3 to 4 days and it is not significant.  She is able to handle the 100 mg twice a day dosage extremely well.  Does not have any other new symptoms or concerns.  ALLERGIES:  has no allergies on file.  MEDICATIONS:  Current Outpatient Medications  Medication Sig Dispense Refill  .  abemaciclib (  VERZENIO) 100 MG tablet Take 1 tablet (100 mg total) by mouth 2 (two) times daily. 60 tablet 3  . ALPRAZolam (XANAX) 0.25 MG tablet Take 1 tablet (0.25 mg total) by mouth at bedtime as needed for anxiety. 30 tablet 3  . baclofen (LIORESAL) 10 MG tablet Take 1 tablet (10 mg total) by mouth 3 (three) times daily as needed for muscle spasms. 30 each 1  . escitalopram (LEXAPRO) 20 MG tablet Take 1 tablet (20 mg total) by mouth daily with supper. 90 tablet 3  . letrozole (FEMARA) 2.5 MG tablet Take 1 tablet (2.5 mg total) by mouth daily. 90 tablet 3  . valACYclovir (VALTREX) 1000 MG tablet Take 1 tablet (1,000 mg total) by mouth 2 (two) times daily. 30 tablet 1   No current facility-administered medications for this visit.   Facility-Administered Medications Ordered in Other Visits  Medication Dose Route Frequency Provider Last Rate Last Admin  . heparin lock flush 100 unit/mL  500 Units Intracatheter Once PRN Nicholas Lose, MD      . sodium chloride flush (NS) 0.9 % injection 10 mL  10 mL Intracatheter PRN Nicholas Lose, MD   10 mL at 11/15/17 1449  . sodium chloride flush (NS) 0.9 % injection 10 mL  10 mL Intracatheter PRN Nicholas Lose, MD      . sodium chloride flush (NS) 0.9 % injection 10 mL  10 mL Intracatheter PRN Nicholas Lose, MD   10 mL at 12/13/17 1500    PHYSICAL EXAMINATION: ECOG PERFORMANCE STATUS: 1 - Symptomatic but completely ambulatory  Vitals:   10/31/20 0815  BP: (!) 102/57  Pulse: (!) 56  Resp: 18  Temp: 97.6 F (36.4 C)  SpO2: 99%   Filed Weights   10/31/20 0815  Weight: 199 lb 3.2 oz (90.4 kg)    LABORATORY DATA:  I have reviewed the data as listed CMP Latest Ref Rng & Units 10/17/2020 10/03/2020 09/05/2020  Glucose 70 - 99 mg/dL 87 86 91  BUN 6 - 20 mg/dL $Remove'16 18 17  'NnauIkW$ Creatinine 0.44 - 1.00 mg/dL 0.87 1.33(H) 1.21(H)  Sodium 135 - 145 mmol/L 140 142 141  Potassium 3.5 - 5.1 mmol/L 4.1 4.0 4.0  Chloride 98 - 111 mmol/L 106 106 107  CO2 22 - 32  mmol/L $Remov'28 26 23  'wdfbUR$ Calcium 8.9 - 10.3 mg/dL 9.3 9.1 9.1  Total Protein 6.5 - 8.1 g/dL 6.8 6.6 6.7  Total Bilirubin 0.3 - 1.2 mg/dL 0.4 0.4 0.5  Alkaline Phos 38 - 126 U/L 86 85 100  AST 15 - 41 U/L 15 14(L) 14(L)  ALT 0 - 44 U/L $Remo'7 8 10    'IUFWZ$ Lab Results  Component Value Date   WBC 4.4 10/31/2020   HGB 11.7 (L) 10/31/2020   HCT 34.5 (L) 10/31/2020   MCV 91.0 10/31/2020   PLT 187 10/31/2020   NEUTROABS 2.0 10/31/2020    ASSESSMENT & PLAN:  Malignant neoplasm of upper-inner quadrant of left breast in female, estrogen receptor positive ([place]) 08/19/2017:Left lumpectomy: IDC grade 2, 2.5 cm, DCIS intermediate grade, lymphovascular invasion identified, margins negative, 1/2 lymph nodes positive with extracapsular extension, ER 90%, PR 40%, HER-2 negative ratio 1.15, Ki-67 15% T2 N1a stage II a; Mammaprint high risk   Recommendation: 1. Adjuvant chemotherapy with dose dense Adriamycin and Cytoxan x4 followed by Taxol weekly x12 completed 01/31/2018 2. followed by adjuvant radiation therapywhich will be completed 04/09/2018 3.Bilateral salpingo-oophorectomy 05/15/2018 4.Followed by adjuvant antiestrogen therapy;will enroll the patient in [facility] clinical trial  _______________________________________________________________________________________________  Current treatment:Anastrozole 1 mg daily started 06/02/2018 discontinued 07/02/2018 due to diffuse muscle aches and pains,switched to letrozole 07/14/2018 Abemaciclib based on clinical trial [facility]  Letrozole toxicities:Occasional hot flashes and mild muscle stiffness but significant improvement from anastrozole.  Arthralgias/myalgias:Much improved on letrozole. Ulnar nerve neuropathy:Taking turmeric Bone density 07/01/2018: Normal T score 0.1  Breast cancer surveillance: 1.Breast MRI2/21/2022:Benign breast density category B 2.Breast exam2/23/2022: Benign 3.Mammogram at [facility]: Benign, breast density category  C  AbemaciclibToxicities: 1.Diarrhea: Tolerating the dosage of Verzenio to 100 mg twice daily very well. 2.slight anemia: Monitoring 3.Renal insufficiency: Improved after holding Verzenio.  Awaiting today's labs  Return to clinic in 4 weeks with labs and follow up     Orders Placed This Encounter  Procedures  . CBC with Differential (Cancer Center Only)    Standing Status:   Future    Standing Expiration Date:   10/31/2021  . CMP ([place] only)    Standing Status:   Future    Standing Expiration Date:   10/31/2021   The patient has a good understanding of the overall plan. she agrees with it. she will call with any problems that may develop before the next visit here.  Total time spent: 30 mins including face to face time and time spent for planning, charting and coordination of care  Rulon Eisenmenger, MD, MPH 10/31/2020  I, Cloyde Reams Dorshimer, am acting as scribe for Dr. Nicholas Lose.  I have reviewed the above documentation for accuracy and completeness, and I agree with the above.

## 2020-10-30 NOTE — Assessment & Plan Note (Signed)
08/19/2017:Left lumpectomy: IDC grade 2, 2.5 cm, DCIS intermediate grade, lymphovascular invasion identified, margins negative, 1/2 lymph nodes positive with extracapsular extension, ER 90%, PR 40%, HER-2 negative ratio 1.15, Ki-67 15% T2 N1a stage II a; Mammaprint high risk   Recommendation: 1. Adjuvant chemotherapy with dose dense Adriamycin and Cytoxan x4 followed by Taxol weekly x12 completed 01/31/2018 2. followed by adjuvant radiation therapywhich will be completed 04/09/2018 3.Bilateral salpingo-oophorectomy 05/15/2018 4.Followed by adjuvant antiestrogen therapy;will enroll the patient in [facility] clinical trial _______________________________________________________________________________________________  Current treatment:Anastrozole 1 mg daily started 06/02/2018 discontinued 07/02/2018 due to diffuse muscle aches and pains,switched to letrozole 07/14/2018 Abemaciclib based on clinical trial [facility]  Letrozole toxicities:Occasional hot flashes and mild muscle stiffness but significant improvement from anastrozole.  Arthralgias/myalgias:Much improved on letrozole. Ulnar nerve neuropathy:Taking turmeric Bone density 07/01/2018: Normal T score 0.1  Breast cancer surveillance: 1.Breast MRI2/21/2022:Benign breast density category B 2.Breast exam2/23/2022: Benign 3.Mammogram at [facility]: Benign, breast density category C  AbemaciclibToxicities: 1.Diarrhea: With cramps, improved with reducing the dosage of Verzenio to 100 mg twice daily. 2.slight anemia: Monitoring 3.Abdominal cramps: She did not have to take baclofen since that resolved when she stopped it. 4.Renal insufficiency: Improved after holding Verzenio.  I instructed her to resume Verzenio at 100 mg p.o. twice daily.  Return to clinic in 4 weeks with labs and follow up

## 2020-10-31 ENCOUNTER — Inpatient Hospital Stay: Payer: Commercial Managed Care - PPO

## 2020-10-31 ENCOUNTER — Inpatient Hospital Stay: Payer: Commercial Managed Care - PPO | Attending: Hematology and Oncology | Admitting: Hematology and Oncology

## 2020-10-31 ENCOUNTER — Other Ambulatory Visit: Payer: Self-pay

## 2020-10-31 DIAGNOSIS — Z79899 Other long term (current) drug therapy: Secondary | ICD-10-CM | POA: Insufficient documentation

## 2020-10-31 DIAGNOSIS — Z170 Estrogen receptor positive status [ER+]: Secondary | ICD-10-CM

## 2020-10-31 DIAGNOSIS — Z9221 Personal history of antineoplastic chemotherapy: Secondary | ICD-10-CM | POA: Insufficient documentation

## 2020-10-31 DIAGNOSIS — C50212 Malignant neoplasm of upper-inner quadrant of left female breast: Secondary | ICD-10-CM | POA: Insufficient documentation

## 2020-10-31 DIAGNOSIS — D649 Anemia, unspecified: Secondary | ICD-10-CM | POA: Diagnosis not present

## 2020-10-31 DIAGNOSIS — Z923 Personal history of irradiation: Secondary | ICD-10-CM | POA: Insufficient documentation

## 2020-10-31 DIAGNOSIS — Z79811 Long term (current) use of aromatase inhibitors: Secondary | ICD-10-CM | POA: Insufficient documentation

## 2020-10-31 LAB — CBC WITH DIFFERENTIAL (CANCER CENTER ONLY)
Abs Immature Granulocytes: 0.01 10*3/uL (ref 0.00–0.07)
Basophils Absolute: 0.1 10*3/uL (ref 0.0–0.1)
Basophils Relative: 1 %
Eosinophils Absolute: 0.1 10*3/uL (ref 0.0–0.5)
Eosinophils Relative: 2 %
HCT: 34.5 % — ABNORMAL LOW (ref 36.0–46.0)
Hemoglobin: 11.7 g/dL — ABNORMAL LOW (ref 12.0–15.0)
Immature Granulocytes: 0 %
Lymphocytes Relative: 47 %
Lymphs Abs: 2 10*3/uL (ref 0.7–4.0)
MCH: 30.9 pg (ref 26.0–34.0)
MCHC: 33.9 g/dL (ref 30.0–36.0)
MCV: 91 fL (ref 80.0–100.0)
Monocytes Absolute: 0.2 10*3/uL (ref 0.1–1.0)
Monocytes Relative: 4 %
Neutro Abs: 2 10*3/uL (ref 1.7–7.7)
Neutrophils Relative %: 46 %
Platelet Count: 187 10*3/uL (ref 150–400)
RBC: 3.79 MIL/uL — ABNORMAL LOW (ref 3.87–5.11)
RDW: 13.4 % (ref 11.5–15.5)
WBC Count: 4.4 10*3/uL (ref 4.0–10.5)
nRBC: 0 % (ref 0.0–0.2)

## 2020-10-31 LAB — CMP (CANCER CENTER ONLY)
ALT: 8 U/L (ref 0–44)
AST: 14 U/L — ABNORMAL LOW (ref 15–41)
Albumin: 4 g/dL (ref 3.5–5.0)
Alkaline Phosphatase: 79 U/L (ref 38–126)
Anion gap: 9 (ref 5–15)
BUN: 18 mg/dL (ref 6–20)
CO2: 25 mmol/L (ref 22–32)
Calcium: 9.4 mg/dL (ref 8.9–10.3)
Chloride: 108 mmol/L (ref 98–111)
Creatinine: 1.21 mg/dL — ABNORMAL HIGH (ref 0.44–1.00)
GFR, Estimated: 58 mL/min — ABNORMAL LOW (ref >60)
Glucose, Bld: 99 mg/dL (ref 70–99)
Potassium: 4.1 mmol/L (ref 3.5–5.1)
Sodium: 142 mmol/L (ref 135–145)
Total Bilirubin: 0.4 mg/dL (ref 0.3–1.2)
Total Protein: 6.8 g/dL (ref 6.5–8.1)

## 2020-11-25 ENCOUNTER — Other Ambulatory Visit: Payer: Self-pay

## 2020-11-25 ENCOUNTER — Inpatient Hospital Stay: Payer: Commercial Managed Care - PPO | Attending: Hematology and Oncology

## 2020-11-25 DIAGNOSIS — Z79899 Other long term (current) drug therapy: Secondary | ICD-10-CM | POA: Diagnosis not present

## 2020-11-25 DIAGNOSIS — Z923 Personal history of irradiation: Secondary | ICD-10-CM | POA: Diagnosis not present

## 2020-11-25 DIAGNOSIS — Z79811 Long term (current) use of aromatase inhibitors: Secondary | ICD-10-CM | POA: Diagnosis not present

## 2020-11-25 DIAGNOSIS — C50212 Malignant neoplasm of upper-inner quadrant of left female breast: Secondary | ICD-10-CM | POA: Diagnosis present

## 2020-11-25 DIAGNOSIS — Z170 Estrogen receptor positive status [ER+]: Secondary | ICD-10-CM | POA: Insufficient documentation

## 2020-11-25 LAB — CMP (CANCER CENTER ONLY)
ALT: 7 U/L (ref 0–44)
AST: 14 U/L — ABNORMAL LOW (ref 15–41)
Albumin: 3.7 g/dL (ref 3.5–5.0)
Alkaline Phosphatase: 88 U/L (ref 38–126)
Anion gap: 8 (ref 5–15)
BUN: 16 mg/dL (ref 6–20)
CO2: 27 mmol/L (ref 22–32)
Calcium: 9.4 mg/dL (ref 8.9–10.3)
Chloride: 106 mmol/L (ref 98–111)
Creatinine: 1.24 mg/dL — ABNORMAL HIGH (ref 0.44–1.00)
GFR, Estimated: 56 mL/min — ABNORMAL LOW (ref >60)
Glucose, Bld: 86 mg/dL (ref 70–99)
Potassium: 4.2 mmol/L (ref 3.5–5.1)
Sodium: 141 mmol/L (ref 135–145)
Total Bilirubin: 0.5 mg/dL (ref 0.3–1.2)
Total Protein: 6.9 g/dL (ref 6.5–8.1)

## 2020-11-25 LAB — CBC WITH DIFFERENTIAL (CANCER CENTER ONLY)
Abs Immature Granulocytes: 0.01 10*3/uL (ref 0.00–0.07)
Basophils Absolute: 0.1 10*3/uL (ref 0.0–0.1)
Basophils Relative: 2 %
Eosinophils Absolute: 0 10*3/uL (ref 0.0–0.5)
Eosinophils Relative: 1 %
HCT: 33.7 % — ABNORMAL LOW (ref 36.0–46.0)
Hemoglobin: 11.8 g/dL — ABNORMAL LOW (ref 12.0–15.0)
Immature Granulocytes: 0 %
Lymphocytes Relative: 44 %
Lymphs Abs: 1.3 10*3/uL (ref 0.7–4.0)
MCH: 32.2 pg (ref 26.0–34.0)
MCHC: 35 g/dL (ref 30.0–36.0)
MCV: 92.1 fL (ref 80.0–100.0)
Monocytes Absolute: 0.2 10*3/uL (ref 0.1–1.0)
Monocytes Relative: 6 %
Neutro Abs: 1.3 10*3/uL — ABNORMAL LOW (ref 1.7–7.7)
Neutrophils Relative %: 47 %
Platelet Count: 169 10*3/uL (ref 150–400)
RBC: 3.66 MIL/uL — ABNORMAL LOW (ref 3.87–5.11)
RDW: 13.2 % (ref 11.5–15.5)
WBC Count: 2.9 10*3/uL — ABNORMAL LOW (ref 4.0–10.5)
nRBC: 0 % (ref 0.0–0.2)

## 2020-12-12 ENCOUNTER — Other Ambulatory Visit: Payer: Self-pay | Admitting: Hematology and Oncology

## 2020-12-12 ENCOUNTER — Telehealth: Payer: Self-pay | Admitting: Hematology and Oncology

## 2020-12-12 DIAGNOSIS — Z170 Estrogen receptor positive status [ER+]: Secondary | ICD-10-CM

## 2020-12-12 DIAGNOSIS — C50212 Malignant neoplasm of upper-inner quadrant of left female breast: Secondary | ICD-10-CM

## 2020-12-12 NOTE — Telephone Encounter (Signed)
Scheduled appts per 7/18 sch msg. Pt aware.  

## 2020-12-13 ENCOUNTER — Ambulatory Visit: Payer: Commercial Managed Care - PPO | Admitting: Hematology and Oncology

## 2020-12-13 ENCOUNTER — Other Ambulatory Visit: Payer: Commercial Managed Care - PPO

## 2020-12-26 ENCOUNTER — Other Ambulatory Visit: Payer: Commercial Managed Care - PPO

## 2020-12-26 ENCOUNTER — Telehealth: Payer: Self-pay | Admitting: *Deleted

## 2020-12-26 ENCOUNTER — Ambulatory Visit: Payer: Commercial Managed Care - PPO | Admitting: Hematology and Oncology

## 2020-12-26 NOTE — Telephone Encounter (Signed)
Pt called to r/s appt for lab and Dr. Lindi Adie to next week. Not feeling well with congestion and fever this past weekend. Tested twice for covid, both times negative. Scheduled and confirmed new appt for 8/8 at 3:45pm

## 2020-12-26 NOTE — Assessment & Plan Note (Deleted)
08/19/2017:Left lumpectomy: IDC grade 2, 2.5 cm, DCIS intermediate grade, lymphovascular invasion identified, margins negative, 1/2 lymph nodes positive with extracapsular extension, ER 90%, PR 40%, HER-2 negative ratio 1.15, Ki-67 15% T2 N1a stage II a; Mammaprint high risk  Recommendation: 1. Adjuvant chemotherapy with dose dense Adriamycin and Cytoxan x4 followed by Taxol weekly x12 completed 01/31/2018 2. followed by adjuvant radiation therapywhich will be completed 04/09/2018 3.Bilateral salpingo-oophorectomy 05/15/2018 4.Followed by adjuvant antiestrogen therapy;will enroll the patient in [facility] clinical trial _______________________________________________________________________________________________  Current treatment:Anastrozole 1 mg daily started 06/02/2018 discontinued 07/02/2018 due to diffuse muscle aches and pains,switched to letrozole 07/14/2018 Abemaciclib based on clinical trial [facility]  Letrozole toxicities:Occasional hot flashes and mild muscle stiffness but significant improvement from anastrozole.  Arthralgias/myalgias:Much improved on letrozole. Ulnar nerve neuropathy:Taking turmeric Bone density 07/01/2018: Normal T score 0.1  Breast cancer surveillance: 1.Breast MRI2/21/2022:Benign breast density category B 2.Breast exam2/23/2022: Benign 3.Mammogram at [facility]: Benign, breast density category C  AbemaciclibToxicities: 1.Diarrhea: Tolerating the dosage of Verzenio to 100 mg twice daily very well. 2.slight anemia: Monitoring 3.Renal insufficiency:Improved after holding Verzenio.  Awaiting today's labs  Return to clinic in8 weeks with labs and follow up 

## 2021-01-01 NOTE — Assessment & Plan Note (Deleted)
08/19/2017:Left lumpectomy: IDC grade 2, 2.5 cm, DCIS intermediate grade, lymphovascular invasion identified, margins negative, 1/2 lymph nodes positive with extracapsular extension, ER 90%, PR 40%, HER-2 negative ratio 1.15, Ki-67 15% T2 N1a stage II a; Mammaprint high risk  Recommendation: 1. Adjuvant chemotherapy with dose dense Adriamycin and Cytoxan x4 followed by Taxol weekly x12 completed 01/31/2018 2. followed by adjuvant radiation therapywhich will be completed 04/09/2018 3.Bilateral salpingo-oophorectomy 05/15/2018 4.Followed by adjuvant antiestrogen therapy;will enroll the patient in [facility] clinical trial _______________________________________________________________________________________________  Current treatment:Anastrozole 1 mg daily started 06/02/2018 discontinued 07/02/2018 due to diffuse muscle aches and pains,switched to letrozole 07/14/2018 Abemaciclib based on clinical trial [facility]  Letrozole toxicities:Occasional hot flashes and mild muscle stiffness but significant improvement from anastrozole.  Arthralgias/myalgias:Much improved on letrozole. Ulnar nerve neuropathy:Taking turmeric Bone density 07/01/2018: Normal T score 0.1  Breast cancer surveillance: 1.Breast MRI2/21/2022:Benign breast density category B 2.Breast exam2/23/2022: Benign 3.Mammogram at [facility]: Benign, breast density category C  AbemaciclibToxicities: 1.Diarrhea: Tolerating the dosage of Verzenio to 100 mg twice daily very well. 2.slight anemia: Monitoring 3.Renal insufficiency:Improved after holding Verzenio.  Awaiting today's labs  URI:

## 2021-01-02 ENCOUNTER — Inpatient Hospital Stay: Payer: Commercial Managed Care - PPO | Admitting: Hematology and Oncology

## 2021-01-02 ENCOUNTER — Other Ambulatory Visit: Payer: Commercial Managed Care - PPO

## 2021-01-02 DIAGNOSIS — C50212 Malignant neoplasm of upper-inner quadrant of left female breast: Secondary | ICD-10-CM

## 2021-01-06 ENCOUNTER — Other Ambulatory Visit: Payer: Self-pay | Admitting: Hematology and Oncology

## 2021-01-06 DIAGNOSIS — Z170 Estrogen receptor positive status [ER+]: Secondary | ICD-10-CM

## 2021-01-06 DIAGNOSIS — C50212 Malignant neoplasm of upper-inner quadrant of left female breast: Secondary | ICD-10-CM

## 2021-01-16 NOTE — Progress Notes (Signed)
Patient Care Team: Jonathon Jordan, MD as PCP - General (Family Medicine) Nicholas Lose, MD as Consulting Physician (Hematology and Oncology) Kyung Rudd, MD as Consulting Physician (Radiation Oncology) Excell Seltzer, MD (Inactive) as Consulting Physician (General Surgery)  DIAGNOSIS:    ICD-10-CM   1. Malignant neoplasm of upper-inner quadrant of left breast in female, estrogen receptor positive ([place])  C50.212    Z17.0       SUMMARY OF ONCOLOGIC HISTORY: Oncology History  Malignant neoplasm of upper-inner quadrant of left breast in female, estrogen receptor positive ([place])  07/24/2017 Initial Diagnosis   Left breast palpable lump upper inner quadrant 11 o'clock position: Ill-defined 1.6 cm mass, axilla ultrasound negative, ultrasound-guided biopsy: Grade 2 IDC ER 90%, PR 40%, Ki-67 15%, HER-2 negative   08/09/2017 Genetic Testing   Negative genetic testing common hereditary cancer panel.  The Hereditary Gene Panel offered by [facility] includes sequencing and/or deletion duplication testing of the following 47 genes: APC, ATM, AXIN2, BARD1, BMPR1A, BRCA1, BRCA2, BRIP1, CDH1, CDK4, CDKN2A (p14ARF), CDKN2A (p16INK4a), CHEK2, CTNNA1, DICER1, EPCAM (Deletion/duplication testing only), GREM1 (promoter region deletion/duplication testing only), KIT, MEN1, MLH1, MSH2, MSH3, MSH6, MUTYH, NBN, NF1, NHTL1, PALB2, PDGFRA, PMS2, POLD1, POLE, PTEN, RAD50, RAD51C, RAD51D, SDHB, SDHC, SDHD, SMAD4, SMARCA4. STK11, TP53, TSC1, TSC2, and VHL.  The following genes were evaluated for sequence changes only: SDHA and HOXB13 c.251G>A variant only. The report date is August 07, 2017.   08/16/2017 Surgery   Left lumpectomy: IDC grade 2, 2.5 cm, DCIS intermediate grade, lymphovascular invasion identified, margins negative, 1/2 lymph nodes positive with extracapsular extension, ER 90%, PR 40%, HER-2 negative ratio 1.15, Ki-67 15% T2 N1a stage II a; Mammaprint high risk    08/16/2017 Miscellaneous   MammaPrint  08/16/17  High risk with average 10-year Risk of recurrence Untreated: 29% MPI: -0.399 94.6% benefit of chemotherapy and Hormonal therapy at 5 years from distant recurrence.    09/12/2017 - 01/31/2018 Chemotherapy   Adjuvant chemotherapy with dose dense Adriamycin and Cytoxan x4 followed by Taxol weekly x12   02/13/2018 Cancer Staging   Staging form: Breast, AJCC 8th Edition - Pathologic: Stage IB (pT2, pN1a(sn), cM0, G2, ER+, PR+, HER2-) - Signed by Nicholas Lose, MD on 07/20/2020 Neoadjuvant therapy: No Method of lymph node assessment: Sentinel lymph node biopsy Multigene prognostic tests performed: MammaPrint Histologic grading system: 3 grade system Laterality: Left Tumor size (mm): 25   02/24/2018 - 04/09/2018 Radiation Therapy   Radiation therapy with Dr. Lisbeth Renshaw 02/24/18-04/09/18   05/13/2018 Surgery   Bilateral salpingo-oophorectomy with lysis of left adnexal adhesions   05/28/2018 -  Anti-estrogen oral therapy   Anastrozole: Discontinued due to joint pains and stiffness, switched to letrozole 07/2018   08/22/2018 Miscellaneous   Abemaciclib started 08/21/2020 dose reduced 09/05/2020     CHIEF COMPLIANT:  Follow-up of left breast cancer on letrozole and abemaciclib  INTERVAL HISTORY: [name] is a 40 y.o. with above-mentioned history of left breast cancer treated with lumpectomy, adjuvant chemotherapy, radiation, and bilateral salpingo-oophorectomy. She is currently on anti-estrogen therapy with letrozole and abemaciclib. She presents to the clinic today for follow-up.  She is tolerating abemaciclib extremely well.  Occasional diarrhea once a day or sometimes every other day.  Denies any other side effects.  Does have mild fatigue.  ALLERGIES:  has no allergies on file.  MEDICATIONS:  Current Outpatient Medications  Medication Sig Dispense Refill   ALPRAZolam (XANAX) 0.25 MG tablet Take 1 tablet (0.25 mg total) by mouth at bedtime as needed  for anxiety. 30 tablet 3    baclofen (LIORESAL) 10 MG tablet Take 1 tablet (10 mg total) by mouth 3 (three) times daily as needed for muscle spasms. 30 each 1   escitalopram (LEXAPRO) 20 MG tablet Take 1 tablet (20 mg total) by mouth daily with supper. 90 tablet 3   letrozole (FEMARA) 2.5 MG tablet Take 1 tablet (2.5 mg total) by mouth daily. 90 tablet 3   valACYclovir (VALTREX) 1000 MG tablet Take 1 tablet (1,000 mg total) by mouth 2 (two) times daily. 30 tablet 1   VERZENIO 100 MG tablet TAKE 1 TABLET BY MOUTH TWICE DAILY 56 tablet 0   No current facility-administered medications for this visit.   Facility-Administered Medications Ordered in Other Visits  Medication Dose Route Frequency Provider Last Rate Last Admin   heparin lock flush 100 unit/mL  500 Units Intracatheter Once PRN Nicholas Lose, MD       sodium chloride flush (NS) 0.9 % injection 10 mL  10 mL Intracatheter PRN Nicholas Lose, MD   10 mL at 11/15/17 1449   sodium chloride flush (NS) 0.9 % injection 10 mL  10 mL Intracatheter PRN Nicholas Lose, MD       sodium chloride flush (NS) 0.9 % injection 10 mL  10 mL Intracatheter PRN Nicholas Lose, MD   10 mL at 12/13/17 1500    PHYSICAL EXAMINATION: ECOG PERFORMANCE STATUS: 1 - Symptomatic but completely ambulatory  Vitals:   01/17/21 0829  BP: 112/67  Pulse: 63  Resp: 18  Temp: (!) 97.2 F (36.2 C)  SpO2: 100%   Filed Weights   01/17/21 0829  Weight: 204 lb 4.8 oz (92.7 kg)       LABORATORY DATA:  I have reviewed the data as listed CMP Latest Ref Rng & Units 11/25/2020 10/31/2020 10/17/2020  Glucose 70 - 99 mg/dL 86 99 87  BUN 6 - 20 mg/dL _0 Creatinine 0.44 - 1.00 mg/dL 1.24(H) 1.21(H) 0.87  Sodium 135 - 145 mmol/L 141 142 140  Potassium 3.5 - 5.1 mmol/L 4.2 4.1 4.1  Chloride 98 - 111 mmol/L 106 108 106  CO2 22 - 32 mmol/L _1 Calcium 8.9 - 10.3 mg/dL 9.4 9.4 9.3  Total Protein 6.5 - 8.1 g/dL 6.9 6.8 6.8  Total Bilirubin 0.3 - 1.2 mg/dL 0.5 0.4 0.4  Alkaline Phos 38 - 126 U/L  88 79 86  AST 15 - 41 U/L 14(L) 14(L) 15  ALT 0 - 44 U/L _2 Lab Results  Component Value Date   WBC 4.2 01/17/2021   HGB 11.5 (L) 01/17/2021   HCT 32.5 (L) 01/17/2021   MCV 91.8 01/17/2021   PLT 173 01/17/2021   NEUTROABS 1.7 01/17/2021    ASSESSMENT & PLAN:  Malignant neoplasm of upper-inner quadrant of left breast in female, estrogen receptor positive ([place]) 08/19/2017:Left lumpectomy: IDC grade 2, 2.5 cm, DCIS intermediate grade, lymphovascular invasion identified, margins negative, 1/2 lymph nodes positive with extracapsular extension, ER 90%, PR 40%, HER-2 negative ratio 1.15, Ki-67 15% T2 N1a stage II a; Mammaprint high risk    Recommendation: 1.  Adjuvant chemotherapy with dose dense Adriamycin and Cytoxan x4 followed by Taxol weekly x12 completed 01/31/2018 2. followed by adjuvant radiation therapy which will be completed 04/09/2018 3.  Bilateral salpingo-oophorectomy 05/15/2018  4. Followed by adjuvant antiestrogen therapy; will enroll the patient in [facility] clinical trial _______________________________________________________________________________________________   Current treatment: Anastrozole 1 mg daily started 06/02/2018  discontinued 07/02/2018 due to diffuse muscle aches and pains, switched to letrozole 07/14/2018 Abemaciclib based on clinical trial [facility]   Letrozole toxicities: Occasional hot flashes and mild muscle stiffness but significant improvement from anastrozole.   Arthralgias/myalgias: Much improved on letrozole. Ulnar nerve neuropathy: Taking turmeric Bone density 07/01/2018: Normal T score 0.1   Breast cancer surveillance: 1. Breast MRI  07/18/2020: Benign breast density category B 2.  Breast exam 07/20/2020: Benign 3.  Mammogram  at [facility]: Benign, breast density category C    Abemaciclib Toxicities: 1.  Diarrhea: Tolerating the dosage of Verzenio to 100 mg twice daily very well. 2. slight anemia: Monitoring 3. Renal insufficiency: Monitoring  closely   We discussed the role of Signatera lab test for detecting minimal residual disease and breast cancer.  We will watch for additional data before adopting this test. Return to clinic in 8 weeks with labs and follow up    No orders of the defined types were placed in this encounter.  The patient has a good understanding of the overall plan. she agrees with it. she will call with any problems that may develop before the next visit here.  Total time spent: 30 mins including face to face time and time spent for planning, charting and coordination of care  Rulon Eisenmenger, MD, MPH 01/17/2021  I, Thana Ates, am acting as scribe for Dr. Nicholas Lose.  I have reviewed the above documentation for accuracy and completeness, and I agree with the above.

## 2021-01-16 NOTE — Assessment & Plan Note (Signed)
08/19/2017:Left lumpectomy: IDC grade 2, 2.5 cm, DCIS intermediate grade, lymphovascular invasion identified, margins negative, 1/2 lymph nodes positive with extracapsular extension, ER 90%, PR 40%, HER-2 negative ratio 1.15, Ki-67 15% T2 N1a stage II a; Mammaprint high risk  Recommendation: 1. Adjuvant chemotherapy with dose dense Adriamycin and Cytoxan x4 followed by Taxol weekly x12 completed 01/31/2018 2. followed by adjuvant radiation therapywhich will be completed 04/09/2018 3.Bilateral salpingo-oophorectomy 05/15/2018 4.Followed by adjuvant antiestrogen therapy;will enroll the patient in [facility] clinical trial _______________________________________________________________________________________________  Current treatment:Anastrozole 1 mg daily started 06/02/2018 discontinued 07/02/2018 due to diffuse muscle aches and pains,switched to letrozole 07/14/2018 Abemaciclib based on clinical trial [facility]  Letrozole toxicities:Occasional hot flashes and mild muscle stiffness but significant improvement from anastrozole.  Arthralgias/myalgias:Much improved on letrozole. Ulnar nerve neuropathy:Taking turmeric Bone density 07/01/2018: Normal T score 0.1  Breast cancer surveillance: 1.Breast MRI2/21/2022:Benign breast density category B 2.Breast exam2/23/2022: Benign 3.Mammogram at [facility]: Benign, breast density category C  AbemaciclibToxicities: 1.Diarrhea: Tolerating the dosage of Verzenio to 100 mg twice daily very well. 2.slight anemia: Monitoring 3.Renal insufficiency:Improved after holding Verzenio.  Awaiting today's labs  Return to clinic in8 weeks with labs and follow up 

## 2021-01-17 ENCOUNTER — Inpatient Hospital Stay: Payer: Commercial Managed Care - PPO | Admitting: Hematology and Oncology

## 2021-01-17 ENCOUNTER — Other Ambulatory Visit: Payer: Self-pay

## 2021-01-17 ENCOUNTER — Inpatient Hospital Stay: Payer: Commercial Managed Care - PPO | Attending: Hematology and Oncology

## 2021-01-17 DIAGNOSIS — Z90722 Acquired absence of ovaries, bilateral: Secondary | ICD-10-CM | POA: Insufficient documentation

## 2021-01-17 DIAGNOSIS — C50212 Malignant neoplasm of upper-inner quadrant of left female breast: Secondary | ICD-10-CM

## 2021-01-17 DIAGNOSIS — Z170 Estrogen receptor positive status [ER+]: Secondary | ICD-10-CM | POA: Insufficient documentation

## 2021-01-17 DIAGNOSIS — Z79811 Long term (current) use of aromatase inhibitors: Secondary | ICD-10-CM | POA: Insufficient documentation

## 2021-01-17 DIAGNOSIS — Z923 Personal history of irradiation: Secondary | ICD-10-CM | POA: Diagnosis not present

## 2021-01-17 DIAGNOSIS — Z9221 Personal history of antineoplastic chemotherapy: Secondary | ICD-10-CM | POA: Insufficient documentation

## 2021-01-17 DIAGNOSIS — Z9079 Acquired absence of other genital organ(s): Secondary | ICD-10-CM | POA: Diagnosis not present

## 2021-01-17 DIAGNOSIS — Z79899 Other long term (current) drug therapy: Secondary | ICD-10-CM | POA: Diagnosis not present

## 2021-01-17 LAB — CMP (CANCER CENTER ONLY)
ALT: 11 U/L (ref 0–44)
AST: 13 U/L — ABNORMAL LOW (ref 15–41)
Albumin: 3.7 g/dL (ref 3.5–5.0)
Alkaline Phosphatase: 98 U/L (ref 38–126)
Anion gap: 10 (ref 5–15)
BUN: 15 mg/dL (ref 6–20)
CO2: 25 mmol/L (ref 22–32)
Calcium: 9.3 mg/dL (ref 8.9–10.3)
Chloride: 107 mmol/L (ref 98–111)
Creatinine: 1.08 mg/dL — ABNORMAL HIGH (ref 0.44–1.00)
GFR, Estimated: >60 mL/min (ref >60)
Glucose, Bld: 101 mg/dL — ABNORMAL HIGH (ref 70–99)
Potassium: 4.1 mmol/L (ref 3.5–5.1)
Sodium: 142 mmol/L (ref 135–145)
Total Bilirubin: <0.2 mg/dL — ABNORMAL LOW (ref 0.3–1.2)
Total Protein: 6.8 g/dL (ref 6.5–8.1)

## 2021-01-17 LAB — CBC WITH DIFFERENTIAL (CANCER CENTER ONLY)
Abs Immature Granulocytes: 0.01 10*3/uL (ref 0.00–0.07)
Basophils Absolute: 0.1 10*3/uL (ref 0.0–0.1)
Basophils Relative: 2 %
Eosinophils Absolute: 0.1 10*3/uL (ref 0.0–0.5)
Eosinophils Relative: 1 %
HCT: 32.5 % — ABNORMAL LOW (ref 36.0–46.0)
Hemoglobin: 11.5 g/dL — ABNORMAL LOW (ref 12.0–15.0)
Immature Granulocytes: 0 %
Lymphocytes Relative: 49 %
Lymphs Abs: 2 10*3/uL (ref 0.7–4.0)
MCH: 32.5 pg (ref 26.0–34.0)
MCHC: 35.4 g/dL (ref 30.0–36.0)
MCV: 91.8 fL (ref 80.0–100.0)
Monocytes Absolute: 0.3 10*3/uL (ref 0.1–1.0)
Monocytes Relative: 7 %
Neutro Abs: 1.7 10*3/uL (ref 1.7–7.7)
Neutrophils Relative %: 41 %
Platelet Count: 173 10*3/uL (ref 150–400)
RBC: 3.54 MIL/uL — ABNORMAL LOW (ref 3.87–5.11)
RDW: 12.7 % (ref 11.5–15.5)
WBC Count: 4.2 10*3/uL (ref 4.0–10.5)
nRBC: 0 % (ref 0.0–0.2)

## 2021-01-17 MED ORDER — ABEMACICLIB 100 MG PO TABS: 100.0000 mg | ORAL_TABLET | Freq: Two times a day (BID) | ORAL | 6 refills | Status: DC

## 2021-03-18 NOTE — Progress Notes (Signed)
Patient Care Team: Jonathon Jordan, MD as PCP - General (Family Medicine) Nicholas Lose, MD as Consulting Physician (Hematology and Oncology) Kyung Rudd, MD as Consulting Physician (Radiation Oncology) Excell Seltzer, MD (Inactive) as Consulting Physician (General Surgery)  DIAGNOSIS:    ICD-10-CM   1. Malignant neoplasm of upper-inner quadrant of left breast in female, estrogen receptor positive ([place])  C50.212    Z17.0       SUMMARY OF ONCOLOGIC HISTORY: Oncology History  Malignant neoplasm of upper-inner quadrant of left breast in female, estrogen receptor positive ([place])  07/24/2017 Initial Diagnosis   Left breast palpable lump upper inner quadrant 11 o'clock position: Ill-defined 1.6 cm mass, axilla ultrasound negative, ultrasound-guided biopsy: Grade 2 IDC ER 90%, PR 40%, Ki-67 15%, HER-2 negative   08/09/2017 Genetic Testing   Negative genetic testing common hereditary cancer panel.  The Hereditary Gene Panel offered by [facility] includes sequencing and/or deletion duplication testing of the following 47 genes: APC, ATM, AXIN2, BARD1, BMPR1A, BRCA1, BRCA2, BRIP1, CDH1, CDK4, CDKN2A (p14ARF), CDKN2A (p16INK4a), CHEK2, CTNNA1, DICER1, EPCAM (Deletion/duplication testing only), GREM1 (promoter region deletion/duplication testing only), KIT, MEN1, MLH1, MSH2, MSH3, MSH6, MUTYH, NBN, NF1, NHTL1, PALB2, PDGFRA, PMS2, POLD1, POLE, PTEN, RAD50, RAD51C, RAD51D, SDHB, SDHC, SDHD, SMAD4, SMARCA4. STK11, TP53, TSC1, TSC2, and VHL.  The following genes were evaluated for sequence changes only: SDHA and HOXB13 c.251G>A variant only. The report date is August 07, 2017.   08/16/2017 Surgery   Left lumpectomy: IDC grade 2, 2.5 cm, DCIS intermediate grade, lymphovascular invasion identified, margins negative, 1/2 lymph nodes positive with extracapsular extension, ER 90%, PR 40%, HER-2 negative ratio 1.15, Ki-67 15% T2 N1a stage II a; Mammaprint high risk    08/16/2017 Miscellaneous   MammaPrint  08/16/17  High risk with average 10-year Risk of recurrence Untreated: 29% MPI: -0.399 94.6% benefit of chemotherapy and Hormonal therapy at 5 years from distant recurrence.    09/12/2017 - 01/31/2018 Chemotherapy   Adjuvant chemotherapy with dose dense Adriamycin and Cytoxan x4 followed by Taxol weekly x12   02/13/2018 Cancer Staging   Staging form: Breast, AJCC 8th Edition - Pathologic: Stage IB (pT2, pN1a(sn), cM0, G2, ER+, PR+, HER2-) - Signed by Nicholas Lose, MD on 07/20/2020 Neoadjuvant therapy: No Method of lymph node assessment: Sentinel lymph node biopsy Multigene prognostic tests performed: MammaPrint Histologic grading system: 3 grade system Laterality: Left Tumor size (mm): 25    02/24/2018 - 04/09/2018 Radiation Therapy   Radiation therapy with Dr. Lisbeth Renshaw 02/24/18-04/09/18   05/13/2018 Surgery   Bilateral salpingo-oophorectomy with lysis of left adnexal adhesions   05/28/2018 -  Anti-estrogen oral therapy   Anastrozole: Discontinued due to joint pains and stiffness, switched to letrozole 07/2018   08/22/2018 Miscellaneous   Abemaciclib started 08/21/2020 dose reduced 09/05/2020     CHIEF COMPLIANT: Follow-up of left breast cancer on letrozole and abemaciclib  INTERVAL HISTORY: [name] is a 40 y.o. with above-mentioned history of left breast cancer treated with lumpectomy, adjuvant chemotherapy, radiation, and bilateral salpingo-oophorectomy. She is currently on anti-estrogen therapy with letrozole and abemaciclib. She presents to the clinic today for follow-up.  Other than mild diarrhea she is tolerating it very well.  She gets 2-3 times loose stools per day.  Denies any nausea or vomiting.  She has been drinking plenty of fluids.  ALLERGIES:  has no allergies on file.  MEDICATIONS:  Current Outpatient Medications  Medication Sig Dispense Refill   abemaciclib (VERZENIO) 100 MG tablet Take 1 tablet (100 mg total) by  mouth 2 (two) times daily. Swallow tablets whole. Do  not chew, crush, or split tablets before swallowing. 56 tablet 6   ALPRAZolam (XANAX) 0.25 MG tablet Take 1 tablet (0.25 mg total) by mouth at bedtime as needed for anxiety. 30 tablet 3   baclofen (LIORESAL) 10 MG tablet Take 1 tablet (10 mg total) by mouth 3 (three) times daily as needed for muscle spasms. 30 each 1   escitalopram (LEXAPRO) 20 MG tablet Take 1 tablet (20 mg total) by mouth daily with supper. 90 tablet 3   letrozole (FEMARA) 2.5 MG tablet Take 1 tablet (2.5 mg total) by mouth daily. 90 tablet 3   valACYclovir (VALTREX) 1000 MG tablet Take 1 tablet (1,000 mg total) by mouth 2 (two) times daily. 30 tablet 1   No current facility-administered medications for this visit.   Facility-Administered Medications Ordered in Other Visits  Medication Dose Route Frequency Provider Last Rate Last Admin   heparin lock flush 100 unit/mL  500 Units Intracatheter Once PRN Nicholas Lose, MD       sodium chloride flush (NS) 0.9 % injection 10 mL  10 mL Intracatheter PRN Nicholas Lose, MD   10 mL at 11/15/17 1449   sodium chloride flush (NS) 0.9 % injection 10 mL  10 mL Intracatheter PRN Nicholas Lose, MD       sodium chloride flush (NS) 0.9 % injection 10 mL  10 mL Intracatheter PRN Nicholas Lose, MD   10 mL at 12/13/17 1500    PHYSICAL EXAMINATION: ECOG PERFORMANCE STATUS: 1 - Symptomatic but completely ambulatory  Vitals:   03/20/21 0837  BP: 128/74  Pulse: 61  Resp: 18  Temp: (!) 97.5 F (36.4 C)  SpO2: 100%   Filed Weights   03/20/21 0837  Weight: 209 lb 9.6 oz (95.1 kg)    BREAST: No palpable masses or nodules in either right or left breasts. No palpable axillary supraclavicular or infraclavicular adenopathy no breast tenderness or nipple discharge. (exam performed in the presence of a chaperone)  LABORATORY DATA:  I have reviewed the data as listed CMP Latest Ref Rng & Units 01/17/2021 11/25/2020 10/31/2020  Glucose 70 - 99 mg/dL 101(H) 86 99  BUN 6 - 20 mg/dL $Remove'15 16 18   'ZeEWjhL$ Creatinine 0.44 - 1.00 mg/dL 1.08(H) 1.24(H) 1.21(H)  Sodium 135 - 145 mmol/L 142 141 142  Potassium 3.5 - 5.1 mmol/L 4.1 4.2 4.1  Chloride 98 - 111 mmol/L 107 106 108  CO2 22 - 32 mmol/L $RemoveB'25 27 25  'zXKWkHyy$ Calcium 8.9 - 10.3 mg/dL 9.3 9.4 9.4  Total Protein 6.5 - 8.1 g/dL 6.8 6.9 6.8  Total Bilirubin 0.3 - 1.2 mg/dL <0.2(L) 0.5 0.4  Alkaline Phos 38 - 126 U/L 98 88 79  AST 15 - 41 U/L 13(L) 14(L) 14(L)  ALT 0 - 44 U/L $Remo'11 7 8    'SPWmO$ Lab Results  Component Value Date   WBC 4.2 03/20/2021   HGB 11.5 (L) 03/20/2021   HCT 32.9 (L) 03/20/2021   MCV 92.4 03/20/2021   PLT 168 03/20/2021   NEUTROABS 1.8 03/20/2021    ASSESSMENT & PLAN:  Malignant neoplasm of upper-inner quadrant of left breast in female, estrogen receptor positive ([place]) 08/19/2017:Left lumpectomy: IDC grade 2, 2.5 cm, DCIS intermediate grade, lymphovascular invasion identified, margins negative, 1/2 lymph nodes positive with extracapsular extension, ER 90%, PR 40%, HER-2 negative ratio 1.15, Ki-67 15% T2 N1a stage II a; Mammaprint high risk    Recommendation: 1.  Adjuvant chemotherapy with dose  dense Adriamycin and Cytoxan x4 followed by Taxol weekly x12 completed 01/31/2018 2. followed by adjuvant radiation therapy which will be completed 04/09/2018 3.  Bilateral salpingo-oophorectomy 05/15/2018  4. Followed by adjuvant antiestrogen therapy; will enroll the patient in [facility] clinical trial _______________________________________________________________________________________________   Current treatment: Anastrozole 1 mg daily started 06/02/2018 discontinued 07/02/2018 due to diffuse muscle aches and pains, switched to letrozole 07/14/2018 Abemaciclib based on clinical trial [facility]   Letrozole toxicities: Occasional hot flashes and mild muscle stiffness but significant improvement from anastrozole.   Arthralgias/myalgias: Much improved on letrozole. Ulnar nerve neuropathy: Taking turmeric Bone density 07/01/2018: Normal T score  0.1   Breast cancer surveillance: 1. Breast MRI  07/18/2020: Benign breast density category B (we will skip MRI next year and obtain it in 2024.) 2.  Breast exam 07/20/2020: Benign 3.  Mammogram 01/03/2021 at [facility]: Benign, breast density category C    Abemaciclib Toxicities: 1.  Diarrhea: Tolerating the dosage of Verzenio to 100 mg twice daily very well.  The maximum diarrhea she gets is 2-3 times a day. 2. slight anemia: Monitoring, today's hemoglobin is 11.5 3. Renal insufficiency: Monitoring closely   We will send for Signatera. Return to clinic in 3 months with labs and follow up    No orders of the defined types were placed in this encounter.  The patient has a good understanding of the overall plan. she agrees with it. she will call with any problems that may develop before the next visit here.  Total time spent: 30 mins including face to face time and time spent for planning, charting and coordination of care  Sabrina Eisenmenger, MD, MPH 03/20/2021  I, Thana Ates, am acting as scribe for Dr. Nicholas Lose.  I have reviewed the above documentation for accuracy and completeness, and I agree with the above.

## 2021-03-20 ENCOUNTER — Inpatient Hospital Stay: Payer: Commercial Managed Care - PPO

## 2021-03-20 ENCOUNTER — Inpatient Hospital Stay: Payer: Commercial Managed Care - PPO | Attending: Hematology and Oncology | Admitting: Hematology and Oncology

## 2021-03-20 ENCOUNTER — Other Ambulatory Visit: Payer: Self-pay

## 2021-03-20 ENCOUNTER — Encounter: Payer: Self-pay | Admitting: *Deleted

## 2021-03-20 DIAGNOSIS — Z9079 Acquired absence of other genital organ(s): Secondary | ICD-10-CM | POA: Diagnosis not present

## 2021-03-20 DIAGNOSIS — Z79811 Long term (current) use of aromatase inhibitors: Secondary | ICD-10-CM | POA: Insufficient documentation

## 2021-03-20 DIAGNOSIS — Z923 Personal history of irradiation: Secondary | ICD-10-CM | POA: Diagnosis not present

## 2021-03-20 DIAGNOSIS — C50212 Malignant neoplasm of upper-inner quadrant of left female breast: Secondary | ICD-10-CM | POA: Insufficient documentation

## 2021-03-20 DIAGNOSIS — R197 Diarrhea, unspecified: Secondary | ICD-10-CM | POA: Insufficient documentation

## 2021-03-20 DIAGNOSIS — Z90722 Acquired absence of ovaries, bilateral: Secondary | ICD-10-CM | POA: Insufficient documentation

## 2021-03-20 DIAGNOSIS — Z170 Estrogen receptor positive status [ER+]: Secondary | ICD-10-CM | POA: Insufficient documentation

## 2021-03-20 DIAGNOSIS — Z9221 Personal history of antineoplastic chemotherapy: Secondary | ICD-10-CM | POA: Diagnosis not present

## 2021-03-20 DIAGNOSIS — N289 Disorder of kidney and ureter, unspecified: Secondary | ICD-10-CM | POA: Insufficient documentation

## 2021-03-20 DIAGNOSIS — D649 Anemia, unspecified: Secondary | ICD-10-CM | POA: Diagnosis not present

## 2021-03-20 DIAGNOSIS — Z79899 Other long term (current) drug therapy: Secondary | ICD-10-CM | POA: Diagnosis not present

## 2021-03-20 LAB — CMP (CANCER CENTER ONLY)
ALT: 12 U/L (ref 0–44)
AST: 15 U/L (ref 15–41)
Albumin: 4 g/dL (ref 3.5–5.0)
Alkaline Phosphatase: 72 U/L (ref 38–126)
Anion gap: 5 (ref 5–15)
BUN: 16 mg/dL (ref 6–20)
CO2: 28 mmol/L (ref 22–32)
Calcium: 8.9 mg/dL (ref 8.9–10.3)
Chloride: 106 mmol/L (ref 98–111)
Creatinine: 1.11 mg/dL — ABNORMAL HIGH (ref 0.44–1.00)
GFR, Estimated: >60 mL/min (ref >60)
Glucose, Bld: 97 mg/dL (ref 70–99)
Potassium: 3.8 mmol/L (ref 3.5–5.1)
Sodium: 139 mmol/L (ref 135–145)
Total Bilirubin: 0.3 mg/dL (ref 0.3–1.2)
Total Protein: 7.1 g/dL (ref 6.5–8.1)

## 2021-03-20 LAB — CBC WITH DIFFERENTIAL (CANCER CENTER ONLY)
Abs Immature Granulocytes: 0.01 10*3/uL (ref 0.00–0.07)
Basophils Absolute: 0.1 10*3/uL (ref 0.0–0.1)
Basophils Relative: 3 %
Eosinophils Absolute: 0.1 10*3/uL (ref 0.0–0.5)
Eosinophils Relative: 2 %
HCT: 32.9 % — ABNORMAL LOW (ref 36.0–46.0)
Hemoglobin: 11.5 g/dL — ABNORMAL LOW (ref 12.0–15.0)
Immature Granulocytes: 0 %
Lymphocytes Relative: 48 %
Lymphs Abs: 2.1 10*3/uL (ref 0.7–4.0)
MCH: 32.3 pg (ref 26.0–34.0)
MCHC: 35 g/dL (ref 30.0–36.0)
MCV: 92.4 fL (ref 80.0–100.0)
Monocytes Absolute: 0.2 10*3/uL (ref 0.1–1.0)
Monocytes Relative: 5 %
Neutro Abs: 1.8 10*3/uL (ref 1.7–7.7)
Neutrophils Relative %: 42 %
Platelet Count: 168 10*3/uL (ref 150–400)
RBC: 3.56 MIL/uL — ABNORMAL LOW (ref 3.87–5.11)
RDW: 12.2 % (ref 11.5–15.5)
WBC Count: 4.2 10*3/uL (ref 4.0–10.5)
nRBC: 0 % (ref 0.0–0.2)

## 2021-03-20 NOTE — Progress Notes (Signed)
Per MD request RN successfully faxed Signatera order form [phone]).

## 2021-03-20 NOTE — Assessment & Plan Note (Signed)
08/19/2017:Left lumpectomy: IDC grade 2, 2.5 cm, DCIS intermediate grade, lymphovascular invasion identified, margins negative, 1/2 lymph nodes positive with extracapsular extension, ER 90%, PR 40%, HER-2 negative ratio 1.15, Ki-67 15% T2 N1a stage II a; Mammaprint high risk   Recommendation: 1. Adjuvant chemotherapy with dose dense Adriamycin and Cytoxan x4 followed by Taxol weekly x12 completed 01/31/2018 2. followed by adjuvant radiation therapywhich will be completed 04/09/2018 3.Bilateral salpingo-oophorectomy 05/15/2018 4.Followed by adjuvant antiestrogen therapy;will enroll the patient in [facility] clinical trial _______________________________________________________________________________________________  Current treatment:Anastrozole 1 mg daily started 06/02/2018 discontinued 07/02/2018 due to diffuse muscle aches and pains,switched to letrozole 07/14/2018 Abemaciclib based on clinical trial [facility]  Letrozole toxicities:Occasional hot flashes and mild muscle stiffness but significant improvement from anastrozole.  Arthralgias/myalgias:Much improved on letrozole. Ulnar nerve neuropathy:Taking turmeric Bone density 07/01/2018: Normal T score 0.1  Breast cancer surveillance: 1.Breast MRI2/21/2022:Benign breast density category B 2.Breast exam2/23/2022: Benign 3.Mammogram 8/9/2022at [facility]: Benign, breast density category C  AbemaciclibToxicities: 1.Diarrhea:Tolerating the dosage ofVerzenio to 100 mg twice dailyvery well. 2.slight anemia: Monitoring 3.Renal insufficiency:Monitoring closely  We discussed the role of Signatera lab test for detecting minimal residual disease and breast cancer.  We will watch for additional data before adopting this test. Return to clinic in8 weeks with labs and follow up

## 2021-03-23 ENCOUNTER — Other Ambulatory Visit: Payer: Self-pay | Admitting: Hematology and Oncology

## 2021-03-28 ENCOUNTER — Telehealth: Payer: Self-pay

## 2021-03-28 NOTE — Telephone Encounter (Signed)
Oral Oncology Patient Advocate Encounter  Met patient in [place] to complete a re-enrollment application for [employer] Patient Assistance Program in an effort to reduce the patient's out of pocket expense for Verzenio to $0.    Application completed and faxed to [phone].   [employer] patient assistance phone number for follow up is [phone].   This encounter will be updated until final determination.     [place] Patient [place] Phone [phone] Fax [phone] 03/28/2021 11:58 AM

## 2021-03-30 ENCOUNTER — Telehealth: Payer: Self-pay

## 2021-03-30 NOTE — Telephone Encounter (Signed)
Attempted to call pt per Baxter Flattery at [facility] who states pt cancelled appt and has not r/s and they can not get in touch with her. Pt did not answer for me either. I left detailed message for pt with contact info for [facility].

## 2021-04-04 ENCOUNTER — Encounter: Payer: Self-pay | Admitting: *Deleted

## 2021-04-04 NOTE — Progress Notes (Signed)
RN attempt x1 to contact pt per Baxter Flattery with [facility] who states pt is not responding to their calls.  No answer, LVM for pt to return call.  RN informed Daleen Snook that lab draw will be placed on hold at this time until pt reaches out.

## 2021-04-07 ENCOUNTER — Other Ambulatory Visit: Payer: Self-pay | Admitting: Hematology and Oncology

## 2021-06-02 ENCOUNTER — Other Ambulatory Visit: Payer: Self-pay | Admitting: *Deleted

## 2021-06-02 ENCOUNTER — Telehealth: Payer: Self-pay | Admitting: *Deleted

## 2021-06-02 MED ORDER — LEVOFLOXACIN 500 MG PO TABS: 500.0000 mg | ORAL_TABLET | Freq: Every day | ORAL | 0 refills | Status: DC

## 2021-06-02 NOTE — Telephone Encounter (Signed)
Received call back from pt.  RN educated pt on Levaquin, pt verbalized understanding.

## 2021-06-02 NOTE — Progress Notes (Signed)
Received VM from pt with complaint of UTI symptoms including; frequency, bladder fullness, and burring with urination.  Pt states her PCP has retired. RN reviewed with MD and orders received for pt to start Levaquin 500 mg p.o daily x7 days.  Prescription sent to pharmacy on file.  MD also states pt needing to establish care with a new PCP.  RN attempt x1 to contact pt.  No answer, LVM for pt to return call to the office to review medication administration.

## 2021-06-05 ENCOUNTER — Other Ambulatory Visit: Payer: Self-pay | Admitting: Hematology and Oncology

## 2021-06-05 ENCOUNTER — Telehealth: Payer: Self-pay | Admitting: *Deleted

## 2021-06-05 DIAGNOSIS — C50212 Malignant neoplasm of upper-inner quadrant of left female breast: Secondary | ICD-10-CM

## 2021-06-05 DIAGNOSIS — Z170 Estrogen receptor positive status [ER+]: Secondary | ICD-10-CM

## 2021-06-05 MED ORDER — ALPRAZOLAM 0.25 MG PO TABS: 0.2500 mg | ORAL_TABLET | Freq: Every evening | ORAL | 3 refills | Status: DC | PRN

## 2021-06-05 NOTE — Progress Notes (Signed)
Xanax refill sent because of anxiety related to the breast lump.

## 2021-06-05 NOTE — Telephone Encounter (Signed)
Pt called with c/o palpable mass at left lumpectomy bed x3 days. Diagnostic MM and [place] ordered. Informed pt will receive call with an appt from [facility]. Received verbal understanding.

## 2021-06-07 ENCOUNTER — Encounter: Payer: Self-pay | Admitting: Hematology and Oncology

## 2021-06-07 ENCOUNTER — Other Ambulatory Visit (HOSPITAL_COMMUNITY): Payer: Self-pay

## 2021-06-07 NOTE — Telephone Encounter (Signed)
Patient denied re-enrollment due to not meeting income requirements.  [place] Patient [place] Phone [phone] Fax [phone] 06/07/2021 9:41 AM

## 2021-06-12 ENCOUNTER — Encounter: Payer: Self-pay | Admitting: Hematology and Oncology

## 2021-06-12 ENCOUNTER — Other Ambulatory Visit (HOSPITAL_COMMUNITY): Payer: Self-pay

## 2021-06-17 NOTE — Progress Notes (Signed)
Patient Care Team: Jonathon Jordan, MD as PCP - General (Family Medicine) Nicholas Lose, MD as Consulting Physician (Hematology and Oncology) Kyung Rudd, MD as Consulting Physician (Radiation Oncology) Excell Seltzer, MD (Inactive) as Consulting Physician (General Surgery)  DIAGNOSIS:    ICD-10-CM   1. Malignant neoplasm of upper-inner quadrant of left breast in female, estrogen receptor positive ([place])  C50.212 MR BREAST BILATERAL W WO CONTRAST INC CAD   Z17.0 CBC with Differential ([place] Only)    CMP ([place] only)      SUMMARY OF ONCOLOGIC HISTORY: Oncology History  Malignant neoplasm of upper-inner quadrant of left breast in female, estrogen receptor positive ([place])  07/24/2017 Initial Diagnosis   Left breast palpable lump upper inner quadrant 11 o'clock position: Ill-defined 1.6 cm mass, axilla ultrasound negative, ultrasound-guided biopsy: Grade 2 IDC ER 90%, PR 40%, Ki-67 15%, HER-2 negative   08/09/2017 Genetic Testing   Negative genetic testing common hereditary cancer panel.  The Hereditary Gene Panel offered by [facility] includes sequencing and/or deletion duplication testing of the following 47 genes: APC, ATM, AXIN2, BARD1, BMPR1A, BRCA1, BRCA2, BRIP1, CDH1, CDK4, CDKN2A (p14ARF), CDKN2A (p16INK4a), CHEK2, CTNNA1, DICER1, EPCAM (Deletion/duplication testing only), GREM1 (promoter region deletion/duplication testing only), KIT, MEN1, MLH1, MSH2, MSH3, MSH6, MUTYH, NBN, NF1, NHTL1, PALB2, PDGFRA, PMS2, POLD1, POLE, PTEN, RAD50, RAD51C, RAD51D, SDHB, SDHC, SDHD, SMAD4, SMARCA4. STK11, TP53, TSC1, TSC2, and VHL.  The following genes were evaluated for sequence changes only: SDHA and HOXB13 c.251G>A variant only. The report date is August 07, 2017.   08/16/2017 Surgery   Left lumpectomy: IDC grade 2, 2.5 cm, DCIS intermediate grade, lymphovascular invasion identified, margins negative, 1/2 lymph nodes positive with extracapsular extension, ER 90%, PR 40%, HER-2 negative  ratio 1.15, Ki-67 15% T2 N1a stage II a; Mammaprint high risk    08/16/2017 Miscellaneous   MammaPrint 08/16/17  High risk with average 10-year Risk of recurrence Untreated: 29% MPI: -0.399 94.6% benefit of chemotherapy and Hormonal therapy at 5 years from distant recurrence.    09/12/2017 - 01/31/2018 Chemotherapy   Adjuvant chemotherapy with dose dense Adriamycin and Cytoxan x4 followed by Taxol weekly x12   02/13/2018 Cancer Staging   Staging form: Breast, AJCC 8th Edition - Pathologic: Stage IB (pT2, pN1a(sn), cM0, G2, ER+, PR+, HER2-) - Signed by Nicholas Lose, MD on 07/20/2020 Neoadjuvant therapy: No Method of lymph node assessment: Sentinel lymph node biopsy Multigene prognostic tests performed: MammaPrint Histologic grading system: 3 grade system Laterality: Left Tumor size (mm): 25    02/24/2018 - 04/09/2018 Radiation Therapy   Radiation therapy with Dr. Lisbeth Renshaw 02/24/18-04/09/18   05/13/2018 Surgery   Bilateral salpingo-oophorectomy with lysis of left adnexal adhesions   05/28/2018 -  Anti-estrogen oral therapy   Anastrozole: Discontinued due to joint pains and stiffness, switched to letrozole 07/2018   08/22/2018 Miscellaneous   Abemaciclib started 08/21/2020 dose reduced 09/05/2020     CHIEF COMPLIANT: Follow-up of left breast cancer on letrozole and abemaciclib  INTERVAL HISTORY: [name] is a 41 y.o. with above-mentioned history of left breast cancer treated with lumpectomy, adjuvant chemotherapy, radiation, and bilateral salpingo-oophorectomy. She is currently on anti-estrogen therapy with letrozole and abemaciclib. She presents to the clinic today for follow-up.  She is tolerating Verzenio fairly well with occasional diarrhea once every 2 to 3 days.  Denies any nausea or vomiting.  ALLERGIES:  has no allergies on file.  MEDICATIONS:  Current Outpatient Medications  Medication Sig Dispense Refill   abemaciclib (VERZENIO) 100 MG tablet  Take 1 tablet (100 mg total) by  mouth 2 (two) times daily. Swallow tablets whole. Do not chew, crush, or split tablets before swallowing. 56 tablet 6   ALPRAZolam (XANAX) 0.25 MG tablet Take 1 tablet (0.25 mg total) by mouth at bedtime as needed for anxiety. 30 tablet 3   baclofen (LIORESAL) 10 MG tablet Take 1 tablet (10 mg total) by mouth 3 (three) times daily as needed for muscle spasms. 30 each 1   escitalopram (LEXAPRO) 20 MG tablet Take 1 tablet (20 mg total) by mouth daily with supper. 90 tablet 3   letrozole (FEMARA) 2.5 MG tablet Take 1 tablet (2.5 mg total) by mouth daily. 90 tablet 3   valACYclovir (VALTREX) 1000 MG tablet TAKE 1 TABLET BY MOUTH TWICE A DAY 180 tablet 1   No current facility-administered medications for this visit.   Facility-Administered Medications Ordered in Other Visits  Medication Dose Route Frequency Provider Last Rate Last Admin   heparin lock flush 100 unit/mL  500 Units Intracatheter Once PRN Nicholas Lose, MD       sodium chloride flush (NS) 0.9 % injection 10 mL  10 mL Intracatheter PRN Nicholas Lose, MD   10 mL at 11/15/17 1449   sodium chloride flush (NS) 0.9 % injection 10 mL  10 mL Intracatheter PRN Nicholas Lose, MD       sodium chloride flush (NS) 0.9 % injection 10 mL  10 mL Intracatheter PRN Nicholas Lose, MD   10 mL at 12/13/17 1500    PHYSICAL EXAMINATION: ECOG PERFORMANCE STATUS: 1 - Symptomatic but completely ambulatory  Vitals:   06/19/21 0921  BP: (!) 109/56  Pulse: 72  Resp: 18  Temp: (!) 97.3 F (36.3 C)  SpO2: 100%   Filed Weights   06/19/21 0921  Weight: 218 lb 12.8 oz (99.2 kg)    BREAST: No palpable masses or nodules in either right or left breasts. No palpable axillary supraclavicular or infraclavicular adenopathy no breast tenderness or nipple discharge. (exam performed in the presence of a chaperone)  LABORATORY DATA:  I have reviewed the data as listed CMP Latest Ref Rng & Units 03/20/2021 01/17/2021 11/25/2020  Glucose 70 - 99 mg/dL 97 101(H) 86   BUN 6 - 20 mg/dL _0 Creatinine 0.44 - 1.00 mg/dL 1.11(H) 1.08(H) 1.24(H)  Sodium 135 - 145 mmol/L 139 142 141  Potassium 3.5 - 5.1 mmol/L 3.8 4.1 4.2  Chloride 98 - 111 mmol/L 106 107 106  CO2 22 - 32 mmol/L _1 Calcium 8.9 - 10.3 mg/dL 8.9 9.3 9.4  Total Protein 6.5 - 8.1 g/dL 7.1 6.8 6.9  Total Bilirubin 0.3 - 1.2 mg/dL 0.3 <0.2(L) 0.5  Alkaline Phos 38 - 126 U/L 72 98 88  AST 15 - 41 U/L 15 13(L) 14(L)  ALT 0 - 44 U/L _2 Lab Results  Component Value Date   WBC 4.2 03/20/2021   HGB 11.5 (L) 03/20/2021   HCT 32.9 (L) 03/20/2021   MCV 92.4 03/20/2021   PLT 168 03/20/2021   NEUTROABS 1.8 03/20/2021    ASSESSMENT & PLAN:  Malignant neoplasm of upper-inner quadrant of left breast in female, estrogen receptor positive ([facility]) 08/19/2017:Left lumpectomy: IDC grade 2, 2.5 cm, DCIS intermediate grade, lymphovascular invasion identified, margins negative, 1/2 lymph nodes positive with extracapsular extension, ER 90%, PR 40%, HER-2 negative ratio 1.15, Ki-67 15% T2 N1a stage II a; Mammaprint high risk    Recommendation: 1.  Adjuvant chemotherapy with dose dense Adriamycin and Cytoxan x4 followed by Taxol weekly x12 completed 01/31/2018 2. followed by adjuvant radiation therapy which will be completed 04/09/2018 3.  Bilateral salpingo-oophorectomy 05/15/2018  4. Followed by adjuvant antiestrogen therapy; will enroll the patient in [facility] clinical trial _______________________________________________________________________________________________   Current treatment: Anastrozole 1 mg daily started 06/02/2018 discontinued 07/02/2018 due to diffuse muscle aches and pains, switched to letrozole 07/14/2018 Abemaciclib based on clinical trial [facility]   Letrozole toxicities: Occasional hot flashes and mild muscle stiffness but significant improvement from anastrozole.   Arthralgias/myalgias: Much improved on letrozole. Ulnar nerve neuropathy: Taking turmeric Bone density  07/01/2018: Normal T score 0.1   Breast cancer surveillance: 1. Breast MRI  07/18/2020: Benign breast density category B (we will skip MRI next year and obtain it in 2024.) 2.  Breast exam 07/20/2020: Benign 3.  Mammogram 01/03/2021 at [facility]: Benign, breast density category C Breast MRI will be ordered for February 2023.  After that we will do MRIs every 2 years. After discussing pros and cons she decided not to do the Signatera test.   Abemaciclib Toxicities: 1.  Diarrhea: Tolerating the dosage of Verzenio to 100 mg twice daily very well.  The maximum diarrhea she gets is 2-3 times once every few days 2. slight anemia: Monitoring 3. Renal insufficiency: Monitoring closely   Palpable lump in the left breast: 06/09/2021 mammogram and ultrasound: No sonographic evidence at the site of palpable concern.  Return to clinic in 3 months with labs and follow up      Orders Placed This Encounter  Procedures   MR BREAST BILATERAL W [place] CAD    Standing Status:   Future    Standing Expiration Date:   06/19/2022    Order Specific Question:   If indicated for the ordered procedure, I authorize the administration of contrast media per Radiology protocol    Answer:   Yes    Order Specific Question:   What is the patient's sedation requirement?    Answer:   Anti-anxiety    Order Specific Question:   Does the patient have a pacemaker or implanted devices?    Answer:   No    Order Specific Question:   Preferred imaging location?    Answer:   [facility] (table limit-550lbs)   CBC with Differential ([place] Only)    Standing Status:   Standing    Number of Occurrences:   10    Standing Expiration Date:   06/19/2022   CMP ([place] only)    Standing Status:   Standing    Number of Occurrences:   10    Standing Expiration Date:   06/19/2022   The patient has a good understanding of the overall plan. she agrees with it. she will call with any problems that may develop before  the next visit here.  Total time spent: 20 mins including face to face time and time spent for planning, charting and coordination of care  Rulon Eisenmenger, MD, MPH 06/19/2021  I, Thana Ates, am acting as scribe for Dr. Nicholas Lose.  I have reviewed the above documentation for accuracy and completeness, and I agree with the above.

## 2021-06-19 ENCOUNTER — Inpatient Hospital Stay: Payer: Commercial Managed Care - PPO

## 2021-06-19 ENCOUNTER — Inpatient Hospital Stay: Payer: Commercial Managed Care - PPO | Attending: Hematology and Oncology | Admitting: Hematology and Oncology

## 2021-06-19 ENCOUNTER — Other Ambulatory Visit: Payer: Self-pay

## 2021-06-19 DIAGNOSIS — C50212 Malignant neoplasm of upper-inner quadrant of left female breast: Secondary | ICD-10-CM

## 2021-06-19 DIAGNOSIS — Z79811 Long term (current) use of aromatase inhibitors: Secondary | ICD-10-CM | POA: Diagnosis not present

## 2021-06-19 DIAGNOSIS — Z923 Personal history of irradiation: Secondary | ICD-10-CM | POA: Diagnosis not present

## 2021-06-19 DIAGNOSIS — Z170 Estrogen receptor positive status [ER+]: Secondary | ICD-10-CM | POA: Insufficient documentation

## 2021-06-19 DIAGNOSIS — Z79899 Other long term (current) drug therapy: Secondary | ICD-10-CM | POA: Diagnosis not present

## 2021-06-19 LAB — CMP (CANCER CENTER ONLY)
ALT: 15 U/L (ref 0–44)
AST: 21 U/L (ref 15–41)
Albumin: 4.3 g/dL (ref 3.5–5.0)
Alkaline Phosphatase: 76 U/L (ref 38–126)
Anion gap: 6 (ref 5–15)
BUN: 20 mg/dL (ref 6–20)
CO2: 28 mmol/L (ref 22–32)
Calcium: 9.4 mg/dL (ref 8.9–10.3)
Chloride: 105 mmol/L (ref 98–111)
Creatinine: 1.04 mg/dL — ABNORMAL HIGH (ref 0.44–1.00)
GFR, Estimated: >60 mL/min (ref >60)
Glucose, Bld: 93 mg/dL (ref 70–99)
Potassium: 4.9 mmol/L (ref 3.5–5.1)
Sodium: 139 mmol/L (ref 135–145)
Total Bilirubin: 0.8 mg/dL (ref 0.3–1.2)
Total Protein: 7.6 g/dL (ref 6.5–8.1)

## 2021-06-19 LAB — CBC WITH DIFFERENTIAL (CANCER CENTER ONLY)
Abs Immature Granulocytes: 0.01 10*3/uL (ref 0.00–0.07)
Basophils Absolute: 0.1 10*3/uL (ref 0.0–0.1)
Basophils Relative: 1 %
Eosinophils Absolute: 0.1 10*3/uL (ref 0.0–0.5)
Eosinophils Relative: 2 %
HCT: 35 % — ABNORMAL LOW (ref 36.0–46.0)
Hemoglobin: 12.3 g/dL (ref 12.0–15.0)
Immature Granulocytes: 0 %
Lymphocytes Relative: 38 %
Lymphs Abs: 1.7 10*3/uL (ref 0.7–4.0)
MCH: 32.2 pg (ref 26.0–34.0)
MCHC: 35.1 g/dL (ref 30.0–36.0)
MCV: 91.6 fL (ref 80.0–100.0)
Monocytes Absolute: 0.3 10*3/uL (ref 0.1–1.0)
Monocytes Relative: 6 %
Neutro Abs: 2.5 10*3/uL (ref 1.7–7.7)
Neutrophils Relative %: 53 %
Platelet Count: 177 10*3/uL (ref 150–400)
RBC: 3.82 MIL/uL — ABNORMAL LOW (ref 3.87–5.11)
RDW: 12.8 % (ref 11.5–15.5)
WBC Count: 4.6 10*3/uL (ref 4.0–10.5)
nRBC: 0 % (ref 0.0–0.2)

## 2021-06-19 NOTE — Assessment & Plan Note (Signed)
08/19/2017:Left lumpectomy: IDC grade 2, 2.5 cm, DCIS intermediate grade, lymphovascular invasion identified, margins negative, 1/2 lymph nodes positive with extracapsular extension, ER 90%, PR 40%, HER-2 negative ratio 1.15, Ki-67 15% T2 N1a stage II a; Mammaprint high risk   Recommendation: 1. Adjuvant chemotherapy with dose dense Adriamycin and Cytoxan x4 followed by Taxol weekly x12 completed 01/31/2018 2. followed by adjuvant radiation therapywhich will be completed 04/09/2018 3.Bilateral salpingo-oophorectomy 05/15/2018 4.Followed by adjuvant antiestrogen therapy;will enroll the patient in [facility] clinical trial _______________________________________________________________________________________________  Current treatment:Anastrozole 1 mg daily started 06/02/2018 discontinued 07/02/2018 due to diffuse muscle aches and pains,switched to letrozole 07/14/2018 Abemaciclib based on clinical trial [facility]  Letrozole toxicities:Occasional hot flashes and mild muscle stiffness but significant improvement from anastrozole.  Arthralgias/myalgias:Much improved on letrozole. Ulnar nerve neuropathy:Taking turmeric Bone density 07/01/2018: Normal T score 0.1  Breast cancer surveillance: 1.Breast MRI2/21/2022:Benign breast density category B (we will skip MRI next year and obtain it in 2024.) 2.Breast exam2/23/2022: Benign 3.Mammogram 8/9/2022at [facility]: Benign, breast density category C  AbemaciclibToxicities: 1.Diarrhea:Tolerating the dosage ofVerzenio to 100 mg twice dailyvery well.  The maximum diarrhea she gets is 2-3 times a day. 2.slight anemia: Monitoring, today's hemoglobin is 11.5 3.Renal insufficiency:Monitoring closely  We will send for Signatera.  Palpable lump in the left breast: 06/09/2021 mammogram and ultrasound: No sonographic evidence at the site of palpable concern.  Return to clinic in3 months with labs and follow up

## 2021-07-01 ENCOUNTER — Ambulatory Visit
Admission: RE | Admit: 2021-07-01 | Discharge: 2021-07-01 | Disposition: A | Discharge status: Home / Self Care | Payer: Commercial Managed Care - PPO | Source: Ambulatory Visit | Attending: Hematology and Oncology | Admitting: Hematology and Oncology

## 2021-07-01 ENCOUNTER — Other Ambulatory Visit: Payer: Self-pay

## 2021-07-01 DIAGNOSIS — C50212 Malignant neoplasm of upper-inner quadrant of left female breast: Secondary | ICD-10-CM

## 2021-07-01 DIAGNOSIS — Z170 Estrogen receptor positive status [ER+]: Secondary | ICD-10-CM

## 2021-07-01 MED ORDER — GADOBUTROL 1 MMOL/ML IV SOLN
9.0000 mL | Freq: Once | INTRAVENOUS | Status: AC | PRN
  Administered 2021-07-01: 9 mL via INTRAVENOUS

## 2021-07-04 ENCOUNTER — Other Ambulatory Visit: Payer: Self-pay | Admitting: Hematology and Oncology

## 2021-07-04 ENCOUNTER — Other Ambulatory Visit: Payer: Self-pay | Admitting: *Deleted

## 2021-07-04 ENCOUNTER — Encounter: Payer: Self-pay | Admitting: *Deleted

## 2021-07-04 ENCOUNTER — Telehealth: Payer: Self-pay | Admitting: *Deleted

## 2021-07-04 DIAGNOSIS — C50212 Malignant neoplasm of upper-inner quadrant of left female breast: Secondary | ICD-10-CM

## 2021-07-04 NOTE — Telephone Encounter (Signed)
Spoke with patient to discuss MRI findings of a new 0.5cm focus in the left breast. MRI bx ordered per recommendation.  She is super anxious and informed her we would get this scheduled asap. Patient verbalized understanding.

## 2021-07-05 ENCOUNTER — Ambulatory Visit
Admission: RE | Admit: 2021-07-05 | Discharge: 2021-07-05 | Disposition: A | Discharge status: Home / Self Care | Payer: Commercial Managed Care - PPO | Source: Ambulatory Visit | Attending: Hematology and Oncology | Admitting: Hematology and Oncology

## 2021-07-05 ENCOUNTER — Other Ambulatory Visit: Payer: Self-pay

## 2021-07-05 DIAGNOSIS — C50212 Malignant neoplasm of upper-inner quadrant of left female breast: Secondary | ICD-10-CM

## 2021-07-05 DIAGNOSIS — Z170 Estrogen receptor positive status [ER+]: Secondary | ICD-10-CM

## 2021-07-05 HISTORY — PX: BREAST BIOPSY: SHX20

## 2021-07-05 MED ORDER — GADOBUTROL 1 MMOL/ML IV SOLN: 10.0000 mL | Freq: Once | INTRAVENOUS | Status: DC | PRN

## 2021-07-06 ENCOUNTER — Encounter: Payer: Self-pay | Admitting: *Deleted

## 2021-08-03 ENCOUNTER — Other Ambulatory Visit: Payer: Self-pay | Admitting: Hematology and Oncology

## 2021-09-04 NOTE — Progress Notes (Incomplete)
? ?Patient Care Team: ?Jonathon Jordan, MD as PCP - General (Family Medicine) ?Nicholas Lose, MD as Consulting Physician (Hematology and Oncology) ?Kyung Rudd, MD as Consulting Physician (Radiation Oncology) ?Excell Seltzer, MD (Inactive) as Consulting Physician (General Surgery) ? ?DIAGNOSIS: No diagnosis found. ? ?SUMMARY OF ONCOLOGIC HISTORY: ?Oncology History  ?Malignant neoplasm of upper-inner quadrant of left breast in female, estrogen receptor positive ([place])  ?07/24/2017 Initial Diagnosis  ? Left breast palpable lump upper inner quadrant 11 o'clock position: Ill-defined 1.6 cm mass, axilla ultrasound negative, ultrasound-guided biopsy: Grade 2 IDC ER 90%, PR 40%, Ki-67 15%, HER-2 negative ?  ?08/09/2017 Genetic Testing  ? Negative genetic testing common hereditary cancer panel.  The Hereditary Gene Panel offered by [facility] includes sequencing and/or deletion duplication testing of the following 47 genes: APC, ATM, AXIN2, BARD1, BMPR1A, BRCA1, BRCA2, BRIP1, CDH1, CDK4, CDKN2A (p14ARF), CDKN2A (p16INK4a), CHEK2, CTNNA1, DICER1, EPCAM (Deletion/duplication testing only), GREM1 (promoter region deletion/duplication testing only), KIT, MEN1, MLH1, MSH2, MSH3, MSH6, MUTYH, NBN, NF1, NHTL1, PALB2, PDGFRA, PMS2, POLD1, POLE, PTEN, RAD50, RAD51C, RAD51D, SDHB, SDHC, SDHD, SMAD4, SMARCA4. STK11, TP53, TSC1, TSC2, and VHL.  The following genes were evaluated for sequence changes only: SDHA and HOXB13 c.251G>A variant only. The report date is August 07, 2017. ?  ?08/16/2017 Surgery  ? Left lumpectomy: IDC grade 2, 2.5 cm, DCIS intermediate grade, lymphovascular invasion identified, margins negative, 1/2 lymph nodes positive with extracapsular extension, ER 90%, PR 40%, HER-2 negative ratio 1.15, Ki-67 15% T2 N1a stage II a; Mammaprint high risk  ?  ?08/16/2017 Miscellaneous  ? MammaPrint 08/16/17  ?High risk with average 10-year Risk of recurrence Untreated: 29% ?MPI: -0.399 ?94.6% benefit of chemotherapy and Hormonal  therapy at 5 years from distant recurrence.  ?  ?09/12/2017 - 01/31/2018 Chemotherapy  ? Adjuvant chemotherapy with dose dense Adriamycin and Cytoxan x4 followed by Taxol weekly x12 ?  ?02/13/2018 Cancer Staging  ? Staging form: Breast, AJCC 8th Edition ?- Pathologic: Stage IB (pT2, pN1a(sn), cM0, G2, ER+, PR+, HER2-) - Signed by Nicholas Lose, MD on 07/20/2020 ?Neoadjuvant therapy: No ?Method of lymph node assessment: Sentinel lymph node biopsy ?Multigene prognostic tests performed: MammaPrint ?Histologic grading system: 3 grade system ?Laterality: Left ?Tumor size (mm): 25 ? ?  ?02/24/2018 - 04/09/2018 Radiation Therapy  ? Radiation therapy with Dr. Lisbeth Renshaw 02/24/18-04/09/18 ?  ?05/13/2018 Surgery  ? Bilateral salpingo-oophorectomy with lysis of left adnexal adhesions ?  ?05/28/2018 -  Anti-estrogen oral therapy  ? Anastrozole: Discontinued due to joint pains and stiffness, switched to letrozole 07/2018 ?  ?08/22/2018 Miscellaneous  ? Abemaciclib started 08/21/2020 dose reduced 09/05/2020 ?  ? ? ?CHIEF COMPLIANT: Follow-up of left breast cancer on letrozole and abemaciclib ? ?INTERVAL HISTORY: [name] is a  ?41 y.o. with above-mentioned history of left breast cancer.  She presents to the clinic today for follow-up. ? ?ALLERGIES:  has no allergies on file. ? ?MEDICATIONS:  ?Current Outpatient Medications  ?Medication Sig Dispense Refill  ? abemaciclib (VERZENIO) 100 MG tablet Take 1 tablet (100 mg total) by mouth 2 (two) times daily. Swallow tablets whole. Do not chew, crush, or split tablets before swallowing. 56 tablet 6  ? ALPRAZolam (XANAX) 0.25 MG tablet Take 1 tablet (0.25 mg total) by mouth at bedtime as needed for anxiety. 30 tablet 3  ? baclofen (LIORESAL) 10 MG tablet Take 1 tablet (10 mg total) by mouth 3 (three) times daily as needed for muscle spasms. 30 each 1  ? escitalopram (LEXAPRO) 20 MG tablet TAKE 1  TABLET BY MOUTH DAILY WITH SUPPER. 90 tablet 3  ? letrozole (FEMARA) 2.5 MG tablet TAKE 1 TABLET BY  MOUTH EVERY DAY 90 tablet 3  ? valACYclovir (VALTREX) 1000 MG tablet TAKE 1 TABLET BY MOUTH TWICE A DAY 180 tablet 1  ? ?No current facility-administered medications for this visit.  ? ?Facility-Administered Medications Ordered in Other Visits  ?Medication Dose Route Frequency Provider Last Rate Last Admin  ? heparin lock flush 100 unit/mL  500 Units Intracatheter Once PRN Nicholas Lose, MD      ? sodium chloride flush (NS) 0.9 % injection 10 mL  10 mL Intracatheter PRN Nicholas Lose, MD   10 mL at 11/15/17 1449  ? sodium chloride flush (NS) 0.9 % injection 10 mL  10 mL Intracatheter PRN Nicholas Lose, MD      ? sodium chloride flush (NS) 0.9 % injection 10 mL  10 mL Intracatheter PRN Nicholas Lose, MD   10 mL at 12/13/17 1500  ? ? ?PHYSICAL EXAMINATION: ?ECOG PERFORMANCE STATUS: {CHL ONC ECOG CE:[id number]} ? ?There were no vitals filed for this visit. ?There were no vitals filed for this visit. ? ?BREAST:*** No palpable masses or nodules in either right or left breasts. No palpable axillary supraclavicular or infraclavicular adenopathy no breast tenderness or nipple discharge. (exam performed in the presence of a chaperone) ? ?LABORATORY DATA:  ?I have reviewed the data as listed ? ?  Latest Ref Rng & Units 06/19/2021  ? 10:05 AM 03/20/2021  ?  8:15 AM 01/17/2021  ?  8:16 AM  ?CMP  ?Glucose 70 - 99 mg/dL 93   97   101    ?BUN 6 - 20 mg/dL _0 ?Creatinine 0.44 - 1.00 mg/dL 1.04   1.11   1.08    ?Sodium 135 - 145 mmol/L 139   139   142    ?Potassium 3.5 - 5.1 mmol/L 4.9   3.8   4.1    ?Chloride 98 - 111 mmol/L 105   106   107    ?CO2 22 - 32 mmol/L _1 ?Calcium 8.9 - 10.3 mg/dL 9.4   8.9   9.3    ?Total Protein 6.5 - 8.1 g/dL 7.6   7.1   6.8    ?Total Bilirubin 0.3 - 1.2 mg/dL 0.8   0.3   <0.2    ?Alkaline Phos 38 - 126 U/L 76   72   98    ?AST 15 - 41 U/L _2 ?ALT 0 - 44 U/L _3 ? ? ?Lab Results  ?Component Value Date  ? WBC 4.6 06/19/2021  ? HGB 12.3 06/19/2021  ? HCT  35.0 (L) 06/19/2021  ? MCV 91.6 06/19/2021  ? PLT 177 06/19/2021  ? NEUTROABS 2.5 06/19/2021  ? ? ?ASSESSMENT & PLAN:  ?No problem-specific Assessment & Plan notes found for this encounter. ? ? ? ?No orders of the defined types were placed in this encounter. ? ?The patient has a good understanding of the overall plan. she agrees with it. she will call with any problems that may develop before the next visit here. ?Total time spent: 30 mins including face to face time and time spent for planning, charting and co-ordination of care ? ? Suzzette Righter, CMA ?09/04/21 ? ? ? I [place], [employer]  am scribing for Dr. Lindi Adie ? ?***  ?

## 2021-09-18 ENCOUNTER — Ambulatory Visit: Payer: Commercial Managed Care - PPO | Admitting: Hematology and Oncology

## 2021-09-18 ENCOUNTER — Telehealth: Payer: Self-pay | Admitting: Hematology and Oncology

## 2021-09-18 ENCOUNTER — Inpatient Hospital Stay: Payer: Commercial Managed Care - PPO

## 2021-09-18 NOTE — Telephone Encounter (Signed)
.  Called patient to schedule appointment per 4/24 inbasket, patient is aware of date and time.   ?

## 2021-09-18 NOTE — Assessment & Plan Note (Deleted)
08/19/2017:Left lumpectomy: IDC grade 2, 2.5 cm, DCIS intermediate grade, lymphovascular invasion identified, margins negative, 1/2 lymph nodes positive with extracapsular extension, ER 90%, PR 40%, HER-2 negative ratio 1.15, Ki-67 15% T2 N1a stage II a; Mammaprint high risk   Recommendation: 1. Adjuvant chemotherapy with dose dense Adriamycin and Cytoxan x4 followed by Taxol weekly x12 completed 01/31/2018 2. followed by adjuvant radiation therapywhich will be completed 04/09/2018 3.Bilateral salpingo-oophorectomy 05/15/2018 4.Followed by adjuvant antiestrogen therapy;will enroll the patient in [facility] clinical trial _______________________________________________________________________________________________  Current treatment:Anastrozole 1 mg daily started 06/02/2018 discontinued 07/02/2018 due to diffuse muscle aches and pains,switched to letrozole 07/14/2018 Abemaciclib based on clinical trial [facility]  Letrozole toxicities:Occasional hot flashes and mild muscle stiffness but significant improvement from anastrozole.  Arthralgias/myalgias:Much improved on letrozole. Ulnar nerve neuropathy:Taking turmeric Bone density 07/01/2018: Normal T score 0.1  Breast cancer surveillance: 1.Breast MRI2/21/2022:Benign breast density category B(we will skip MRI next year and obtain it in 2024.) 2.Breast exam2/23/2022: Benign 3.Mammogram8/9/2022at [facility]: Benign, breast density category C Breast MRI will be ordered for February 2023.  After that we will do MRIs every 2 years. After discussing pros and cons she decided not to do the Signatera test.  AbemaciclibToxicities: 1.Diarrhea:Tolerating the dosage ofVerzenio to 100 mg twice dailyvery well.The maximum diarrhea she gets is 2-3 times once every few days 2.slight anemia: Monitoring 3.Renal insufficiency:Monitoring closely   Palpable lump in the left breast: 06/09/2021 mammogram and ultrasound: No sonographic  evidence at the site of palpable concern.  Return to clinic in3 monthswith labs and follow up

## 2021-09-25 ENCOUNTER — Inpatient Hospital Stay: Payer: Commercial Managed Care - PPO | Admitting: Hematology and Oncology

## 2021-09-25 ENCOUNTER — Inpatient Hospital Stay: Payer: Commercial Managed Care - PPO | Attending: Hematology and Oncology

## 2021-09-25 ENCOUNTER — Other Ambulatory Visit: Payer: Self-pay

## 2021-09-25 DIAGNOSIS — Z923 Personal history of irradiation: Secondary | ICD-10-CM | POA: Diagnosis not present

## 2021-09-25 DIAGNOSIS — Z170 Estrogen receptor positive status [ER+]: Secondary | ICD-10-CM | POA: Insufficient documentation

## 2021-09-25 DIAGNOSIS — Z79811 Long term (current) use of aromatase inhibitors: Secondary | ICD-10-CM | POA: Diagnosis not present

## 2021-09-25 DIAGNOSIS — Z79899 Other long term (current) drug therapy: Secondary | ICD-10-CM | POA: Diagnosis not present

## 2021-09-25 DIAGNOSIS — C50212 Malignant neoplasm of upper-inner quadrant of left female breast: Secondary | ICD-10-CM

## 2021-09-25 DIAGNOSIS — Z9221 Personal history of antineoplastic chemotherapy: Secondary | ICD-10-CM | POA: Diagnosis not present

## 2021-09-25 LAB — CBC WITH DIFFERENTIAL (CANCER CENTER ONLY)
Abs Immature Granulocytes: 0.04 10*3/uL (ref 0.00–0.07)
Basophils Absolute: 0 10*3/uL (ref 0.0–0.1)
Basophils Relative: 1 %
Eosinophils Absolute: 0.1 10*3/uL (ref 0.0–0.5)
Eosinophils Relative: 3 %
HCT: 33.7 % — ABNORMAL LOW (ref 36.0–46.0)
Hemoglobin: 11.7 g/dL — ABNORMAL LOW (ref 12.0–15.0)
Immature Granulocytes: 1 %
Lymphocytes Relative: 41 %
Lymphs Abs: 1.8 10*3/uL (ref 0.7–4.0)
MCH: 31.9 pg (ref 26.0–34.0)
MCHC: 34.7 g/dL (ref 30.0–36.0)
MCV: 91.8 fL (ref 80.0–100.0)
Monocytes Absolute: 0.2 10*3/uL (ref 0.1–1.0)
Monocytes Relative: 5 %
Neutro Abs: 2.1 10*3/uL (ref 1.7–7.7)
Neutrophils Relative %: 49 %
Platelet Count: 184 10*3/uL (ref 150–400)
RBC: 3.67 MIL/uL — ABNORMAL LOW (ref 3.87–5.11)
RDW: 12.3 % (ref 11.5–15.5)
WBC Count: 4.3 10*3/uL (ref 4.0–10.5)
nRBC: 0 % (ref 0.0–0.2)

## 2021-09-25 LAB — CMP (CANCER CENTER ONLY)
ALT: 11 U/L (ref 0–44)
AST: 13 U/L — ABNORMAL LOW (ref 15–41)
Albumin: 4 g/dL (ref 3.5–5.0)
Alkaline Phosphatase: 88 U/L (ref 38–126)
Anion gap: 7 (ref 5–15)
BUN: 14 mg/dL (ref 6–20)
CO2: 29 mmol/L (ref 22–32)
Calcium: 9.5 mg/dL (ref 8.9–10.3)
Chloride: 105 mmol/L (ref 98–111)
Creatinine: 1.2 mg/dL — ABNORMAL HIGH (ref 0.44–1.00)
GFR, Estimated: 59 mL/min — ABNORMAL LOW (ref >60)
Glucose, Bld: 105 mg/dL — ABNORMAL HIGH (ref 70–99)
Potassium: 4 mmol/L (ref 3.5–5.1)
Sodium: 141 mmol/L (ref 135–145)
Total Bilirubin: 0.3 mg/dL (ref 0.3–1.2)
Total Protein: 6.7 g/dL (ref 6.5–8.1)

## 2021-09-25 NOTE — Assessment & Plan Note (Addendum)
08/19/2017:Left lumpectomy: IDC grade 2, 2.5 cm, DCIS intermediate grade, lymphovascular invasion identified, margins negative, 1/2 lymph nodes positive with extracapsular extension, ER 90%, PR 40%, HER-2 negative ratio 1.15, Ki-67 15% T2 N1a stage II a; Mammaprint high risk  ?? ?Recommendation: ?1. ?Adjuvant chemotherapy with dose dense Adriamycin and Cytoxan x4 followed by Taxol weekly x12 completed 01/31/2018 ?2. followed by adjuvant radiation therapy?which will be completed 04/09/2018 ?3.??Bilateral salpingo-oophorectomy 05/15/2018 ??4.?Followed by adjuvant antiestrogen therapy;?along with abemaciclib ?_______________________________________________________________________________________________ ?? ?Current treatment:?Anastrozole 1 mg daily started 06/02/2018 discontinued 07/02/2018 due to diffuse muscle aches and pains,?switched to letrozole 07/14/2018 ?Abemaciclib based on clinical trial [facility] ?? ?Letrozole toxicities:?Occasional hot flashes and mild muscle stiffness but significant improvement from anastrozole. ?? ?Arthralgias/myalgias:?Much improved on letrozole. ?Ulnar nerve neuropathy:?Taking turmeric ?Bone density 07/01/2018: Normal T score 0.1 ?? ?Breast cancer surveillance: ?1.?Breast MRI??07/18/2020:?Benign breast density category B?(we will skip MRI next year and obtain it in 2024.) ?2.??Breast exam?07/20/2020: Benign ?3.??Mammogram?01/03/2021?at [facility]: Benign, breast density category C ?Breast MRI will be ordered for February 2023.  After that we will do MRIs every 2 years. ?After discussing pros and cons she decided not to do the Signatera test. ?? ??Abemaciclib?Toxicities: ?1.??Diarrhea:?Tolerating the dosage of?Verzenio to 100 mg twice daily?very well.??The maximum diarrhea she gets is 2-3 times once every few days ?2.?slight anemia: Monitoring ?3.?Renal insufficiency:?Monitoring closely ?? ?? ?Palpable lump in the left breast: 06/09/2021 mammogram and ultrasound: No sonographic evidence at the site of  palpable concern. ?07/03/21: Breast MRI: New Indeterminate 0.5 cm left breast focus: Biopsy 07/05/2021: Fat necrosis ?? ?Return to clinic in?3 months?with labs and follow up ?

## 2021-09-25 NOTE — Progress Notes (Signed)
? ?Patient Care Team: ?Jonathon Jordan, MD as PCP - General (Family Medicine) ?Nicholas Lose, MD as Consulting Physician (Hematology and Oncology) ?Kyung Rudd, MD as Consulting Physician (Radiation Oncology) ?Excell Seltzer, MD (Inactive) as Consulting Physician (General Surgery) ? ?DIAGNOSIS:  ?Encounter Diagnosis  ?Name Primary?  ? Malignant neoplasm of upper-inner quadrant of left breast in female, estrogen receptor positive ([place])   ? ? ?SUMMARY OF ONCOLOGIC HISTORY: ?Oncology History  ?Malignant neoplasm of upper-inner quadrant of left breast in female, estrogen receptor positive ([place])  ?07/24/2017 Initial Diagnosis  ? Left breast palpable lump upper inner quadrant 11 o'clock position: Ill-defined 1.6 cm mass, axilla ultrasound negative, ultrasound-guided biopsy: Grade 2 IDC ER 90%, PR 40%, Ki-67 15%, HER-2 negative ? ?  ?08/09/2017 Genetic Testing  ? Negative genetic testing common hereditary cancer panel.  The Hereditary Gene Panel offered by [facility] includes sequencing and/or deletion duplication testing of the following 47 genes: APC, ATM, AXIN2, BARD1, BMPR1A, BRCA1, BRCA2, BRIP1, CDH1, CDK4, CDKN2A (p14ARF), CDKN2A (p16INK4a), CHEK2, CTNNA1, DICER1, EPCAM (Deletion/duplication testing only), GREM1 (promoter region deletion/duplication testing only), KIT, MEN1, MLH1, MSH2, MSH3, MSH6, MUTYH, NBN, NF1, NHTL1, PALB2, PDGFRA, PMS2, POLD1, POLE, PTEN, RAD50, RAD51C, RAD51D, SDHB, SDHC, SDHD, SMAD4, SMARCA4. STK11, TP53, TSC1, TSC2, and VHL.  The following genes were evaluated for sequence changes only: SDHA and HOXB13 c.251G>A variant only. The report date is August 07, 2017. ? ?  ?08/16/2017 Surgery  ? Left lumpectomy: IDC grade 2, 2.5 cm, DCIS intermediate grade, lymphovascular invasion identified, margins negative, 1/2 lymph nodes positive with extracapsular extension, ER 90%, PR 40%, HER-2 negative ratio 1.15, Ki-67 15% T2 N1a stage II a; Mammaprint high risk  ? ?  ?08/16/2017 Miscellaneous  ? MammaPrint  08/16/17  ?High risk with average 10-year Risk of recurrence Untreated: 29% ?MPI: -0.399 ?94.6% benefit of chemotherapy and Hormonal therapy at 5 years from distant recurrence.  ? ?  ?09/12/2017 - 01/31/2018 Chemotherapy  ? Adjuvant chemotherapy with dose dense Adriamycin and Cytoxan x4 followed by Taxol weekly x12 ?  ?02/13/2018 Cancer Staging  ? Staging form: Breast, AJCC 8th Edition ?- Pathologic: Stage IB (pT2, pN1a(sn), cM0, G2, ER+, PR+, HER2-) - Signed by Nicholas Lose, MD on 07/20/2020 ?Neoadjuvant therapy: No ?Method of lymph node assessment: Sentinel lymph node biopsy ?Multigene prognostic tests performed: MammaPrint ?Histologic grading system: 3 grade system ?Laterality: Left ?Tumor size (mm): 25 ? ?  ?02/24/2018 - 04/09/2018 Radiation Therapy  ? Radiation therapy with Dr. Lisbeth Renshaw 02/24/18-04/09/18 ? ?  ?05/13/2018 Surgery  ? Bilateral salpingo-oophorectomy with lysis of left adnexal adhesions ? ?  ?05/28/2018 -  Anti-estrogen oral therapy  ? Anastrozole: Discontinued due to joint pains and stiffness, switched to letrozole 07/2018 ?  ?08/22/2018 Miscellaneous  ? Abemaciclib started 08/21/2020 dose reduced 09/05/2020 ?  ? ? ?CHIEF COMPLIANT: Follow-up of left breast cancer on letrozole and Verzenio ? ?INTERVAL HISTORY: [name] is a 41 y.o. with above-mentioned history of left breast cancer treated with lumpectomy, adjuvant chemotherapy, radiation, and bilateral salpingo-oophorectomy. She is currently on anti-estrogen therapy with letrozole and Verzenio. She presents to the clinic today for follow-up. She state diarrhea is 1 to times a day but she is tolerating the verzenio. States that she is tolerating the letrozole states that she still is having some hot flashes. ? ? ?ALLERGIES:  has no allergies on file. ? ?MEDICATIONS:  ?Current Outpatient Medications  ?Medication Sig Dispense Refill  ? abemaciclib (VERZENIO) 100 MG tablet Take 1 tablet (100 mg total) by mouth 2 (two)  times daily. Swallow tablets whole. Do  not chew, crush, or split tablets before swallowing. 56 tablet 6  ? ALPRAZolam (XANAX) 0.25 MG tablet Take 1 tablet (0.25 mg total) by mouth at bedtime as needed for anxiety. 30 tablet 3  ? baclofen (LIORESAL) 10 MG tablet Take 1 tablet (10 mg total) by mouth 3 (three) times daily as needed for muscle spasms. 30 each 1  ? escitalopram (LEXAPRO) 20 MG tablet TAKE 1 TABLET BY MOUTH DAILY WITH SUPPER. 90 tablet 3  ? letrozole (FEMARA) 2.5 MG tablet TAKE 1 TABLET BY MOUTH EVERY DAY 90 tablet 3  ? valACYclovir (VALTREX) 1000 MG tablet TAKE 1 TABLET BY MOUTH TWICE A DAY 180 tablet 1  ? ?No current facility-administered medications for this visit.  ? ?Facility-Administered Medications Ordered in Other Visits  ?Medication Dose Route Frequency Provider Last Rate Last Admin  ? heparin lock flush 100 unit/mL  500 Units Intracatheter Once PRN Nicholas Lose, MD      ? sodium chloride flush (NS) 0.9 % injection 10 mL  10 mL Intracatheter PRN Nicholas Lose, MD   10 mL at 11/15/17 1449  ? sodium chloride flush (NS) 0.9 % injection 10 mL  10 mL Intracatheter PRN Nicholas Lose, MD      ? sodium chloride flush (NS) 0.9 % injection 10 mL  10 mL Intracatheter PRN Nicholas Lose, MD   10 mL at 12/13/17 1500  ? ? ?PHYSICAL EXAMINATION: ?ECOG PERFORMANCE STATUS: 1 - Symptomatic but completely ambulatory ? ?Vitals:  ? 09/25/21 0815  ?BP: 111/70  ?Pulse: 73  ?Resp: 19  ?Temp: (!) 97.2 ?F (36.2 ?C)  ?SpO2: 99%  ? ?Filed Weights  ? 09/25/21 0815  ?Weight: 219 lb 9.6 oz (99.6 kg)  ? ?  ? ?LABORATORY DATA:  ?I have reviewed the data as listed ? ?  Latest Ref Rng & Units 06/19/2021  ? 10:05 AM 03/20/2021  ?  8:15 AM 01/17/2021  ?  8:16 AM  ?CMP  ?Glucose 70 - 99 mg/dL 93   97   101    ?BUN 6 - 20 mg/dL $Remove'20   16   15    'nltaHil$ ?Creatinine 0.44 - 1.00 mg/dL 1.04   1.11   1.08    ?Sodium 135 - 145 mmol/L 139   139   142    ?Potassium 3.5 - 5.1 mmol/L 4.9   3.8   4.1    ?Chloride 98 - 111 mmol/L 105   106   107    ?CO2 22 - 32 mmol/L $RemoveB'28   28   25    'LyBhLlQc$ ?Calcium  8.9 - 10.3 mg/dL 9.4   8.9   9.3    ?Total Protein 6.5 - 8.1 g/dL 7.6   7.1   6.8    ?Total Bilirubin 0.3 - 1.2 mg/dL 0.8   0.3   <0.2    ?Alkaline Phos 38 - 126 U/L 76   72   98    ?AST 15 - 41 U/L $Remo'21   15   13    'neiXG$ ?ALT 0 - 44 U/L $Remo'15   12   11    'vEwRc$ ? ? ?Lab Results  ?Component Value Date  ? WBC 4.3 09/25/2021  ? HGB 11.7 (L) 09/25/2021  ? HCT 33.7 (L) 09/25/2021  ? MCV 91.8 09/25/2021  ? PLT 184 09/25/2021  ? NEUTROABS 2.1 09/25/2021  ? ? ?ASSESSMENT & PLAN:  ?Malignant neoplasm of upper-inner quadrant of left breast in female, estrogen receptor  positive ([place]) ?08/19/2017:Left lumpectomy: IDC grade 2, 2.5 cm, DCIS intermediate grade, lymphovascular invasion identified, margins negative, 1/2 lymph nodes positive with extracapsular extension, ER 90%, PR 40%, HER-2 negative ratio 1.15, Ki-67 15% T2 N1a stage II a; Mammaprint high risk  ?  ?Recommendation: ?1.  Adjuvant chemotherapy with dose dense Adriamycin and Cytoxan x4 followed by Taxol weekly x12 completed 01/31/2018 ?2. followed by adjuvant radiation therapy which will be completed 04/09/2018 ?3.  Bilateral salpingo-oophorectomy 05/15/2018 ? 4. Followed by adjuvant antiestrogen therapy; along with abemaciclib ?_______________________________________________________________________________________________ ?  ?Current treatment: Anastrozole 1 mg daily started 06/02/2018 discontinued 07/02/2018 due to diffuse muscle aches and pains, switched to letrozole 07/14/2018 ?Abemaciclib based on clinical trial [facility] ?  ?Letrozole toxicities: Occasional hot flashes and mild muscle stiffness but significant improvement from anastrozole. ?  ?Arthralgias/myalgias: Much improved on letrozole. ?Ulnar nerve neuropathy: Taking turmeric ?Bone density 07/01/2018: Normal T score 0.1 ?  ?Breast cancer surveillance: ?1. Breast MRI  07/18/2020: Benign breast density category B (we will skip MRI next year and obtain it in 2024.) ?2.  Breast exam 07/20/2020: Benign ?3.  Mammogram 01/03/2021 at [facility]:  Benign, breast density category C ?Breast MRI will be ordered for February 2023.  After that we will do MRIs every 2 years. ?After discussing pros and cons she decided not to do the Signatera test. ?  ? Ab

## 2021-09-26 ENCOUNTER — Telehealth: Payer: Self-pay | Admitting: Hematology and Oncology

## 2021-09-26 NOTE — Telephone Encounter (Signed)
Per 5/1 los called and spoke to pt about lab check appointment  pt confirmed appointment  ?

## 2021-10-26 ENCOUNTER — Telehealth: Payer: Self-pay | Admitting: Pharmacy Technician

## 2021-10-26 ENCOUNTER — Telehealth: Payer: Self-pay | Admitting: *Deleted

## 2021-10-26 ENCOUNTER — Other Ambulatory Visit (HOSPITAL_COMMUNITY): Payer: Self-pay

## 2021-10-26 NOTE — Telephone Encounter (Signed)
Oral Oncology Patient Advocate Encounter   Received notification that prior authorization for Verzenio is required.   PA submitted on 10/26/2021 Key [id number] Status is pending     Lady Deutscher, CPhT-Adv Pharmacy Patient Advocate Specialist [place] Patient Advocate Team Direct Number: [phone]  Fax: [phone]

## 2021-10-26 NOTE — Telephone Encounter (Signed)
Pt called informing she needs to have a pt assistance form filled out as her form has expired. Per oral pharmacy pt will need to come in on 6/2 with financial tax forms and will need to provide signature between 1-4pm. Confirmed the above with pt. Informed oral pharmacy.

## 2021-10-26 NOTE — Telephone Encounter (Signed)
Oral Oncology Patient Advocate Encounter   Began application for assistance for Verzenio through [employer].   Application will be submitted upon completion of necessary supporting documentation.   [employer] number [phone].   I will continue to check the status until final determination.   Lady Deutscher, CPhT-Adv Pharmacy Patient Advocate Specialist [place] Patient Advocate Team Direct Number: [phone]  Fax: [phone]

## 2021-10-27 ENCOUNTER — Other Ambulatory Visit (HOSPITAL_COMMUNITY): Payer: Self-pay

## 2021-10-27 ENCOUNTER — Other Ambulatory Visit: Payer: Self-pay

## 2021-10-27 ENCOUNTER — Inpatient Hospital Stay: Payer: Commercial Managed Care - PPO | Attending: Hematology and Oncology

## 2021-10-27 DIAGNOSIS — Z923 Personal history of irradiation: Secondary | ICD-10-CM | POA: Diagnosis not present

## 2021-10-27 DIAGNOSIS — C50212 Malignant neoplasm of upper-inner quadrant of left female breast: Secondary | ICD-10-CM | POA: Insufficient documentation

## 2021-10-27 DIAGNOSIS — Z170 Estrogen receptor positive status [ER+]: Secondary | ICD-10-CM | POA: Insufficient documentation

## 2021-10-27 DIAGNOSIS — Z79811 Long term (current) use of aromatase inhibitors: Secondary | ICD-10-CM | POA: Insufficient documentation

## 2021-10-27 LAB — CBC WITH DIFFERENTIAL (CANCER CENTER ONLY)
Abs Immature Granulocytes: 0.02 10*3/uL (ref 0.00–0.07)
Basophils Absolute: 0.1 10*3/uL (ref 0.0–0.1)
Basophils Relative: 2 %
Eosinophils Absolute: 0.1 10*3/uL (ref 0.0–0.5)
Eosinophils Relative: 2 %
HCT: 33.6 % — ABNORMAL LOW (ref 36.0–46.0)
Hemoglobin: 12 g/dL (ref 12.0–15.0)
Immature Granulocytes: 0 %
Lymphocytes Relative: 42 %
Lymphs Abs: 1.9 10*3/uL (ref 0.7–4.0)
MCH: 32.4 pg (ref 26.0–34.0)
MCHC: 35.7 g/dL (ref 30.0–36.0)
MCV: 90.8 fL (ref 80.0–100.0)
Monocytes Absolute: 0.2 10*3/uL (ref 0.1–1.0)
Monocytes Relative: 4 %
Neutro Abs: 2.2 10*3/uL (ref 1.7–7.7)
Neutrophils Relative %: 50 %
Platelet Count: 164 10*3/uL (ref 150–400)
RBC: 3.7 MIL/uL — ABNORMAL LOW (ref 3.87–5.11)
RDW: 12.8 % (ref 11.5–15.5)
WBC Count: 4.6 10*3/uL (ref 4.0–10.5)
nRBC: 0 % (ref 0.0–0.2)

## 2021-10-27 LAB — CMP (CANCER CENTER ONLY)
ALT: 35 U/L (ref 0–44)
AST: 28 U/L (ref 15–41)
Albumin: 3.9 g/dL (ref 3.5–5.0)
Alkaline Phosphatase: 73 U/L (ref 38–126)
Anion gap: 6 (ref 5–15)
BUN: 15 mg/dL (ref 6–20)
CO2: 29 mmol/L (ref 22–32)
Calcium: 9.3 mg/dL (ref 8.9–10.3)
Chloride: 106 mmol/L (ref 98–111)
Creatinine: 1.11 mg/dL — ABNORMAL HIGH (ref 0.44–1.00)
GFR, Estimated: >60 mL/min (ref >60)
Glucose, Bld: 114 mg/dL — ABNORMAL HIGH (ref 70–99)
Potassium: 3.7 mmol/L (ref 3.5–5.1)
Sodium: 141 mmol/L (ref 135–145)
Total Bilirubin: 0.5 mg/dL (ref 0.3–1.2)
Total Protein: 7.1 g/dL (ref 6.5–8.1)

## 2021-10-27 NOTE — Telephone Encounter (Signed)
Oral Oncology Patient Advocate Encounter  Prior Authorization for Melynda Keller has been approved.    PA# [id number] Effective dates: 10/26/2021 through 07/27/2022  Patient must fill at [facility].    Lady Deutscher, CPhT-Adv Pharmacy Patient Advocate Specialist [place] Patient Advocate Team Direct Number: [phone]  Fax: [phone]

## 2021-11-01 ENCOUNTER — Other Ambulatory Visit (HOSPITAL_COMMUNITY): Payer: Self-pay

## 2021-11-02 ENCOUNTER — Other Ambulatory Visit: Payer: Self-pay | Admitting: Pharmacist

## 2021-11-02 DIAGNOSIS — C50212 Malignant neoplasm of upper-inner quadrant of left female breast: Secondary | ICD-10-CM

## 2021-11-02 MED ORDER — ABEMACICLIB 100 MG PO TABS: 100.0000 mg | ORAL_TABLET | Freq: Two times a day (BID) | ORAL | 0 refills | Status: DC

## 2021-11-03 NOTE — Telephone Encounter (Signed)
Oral Oncology Patient Advocate Encounter   After following up with [facility] [phone], I was made aware that the patient has a $125 co-pay.   I have spoken with the patient and made them aware of a co-pay card that is available through the [employer].  The patient verbalized understanding of the enrollment process.  The application for [place] will be pended until further notice of need.    Lady Deutscher, CPhT-Adv Pharmacy Patient Advocate Specialist [place] Patient Advocate Team Direct Number: [phone]  Fax: [phone]

## 2021-11-13 ENCOUNTER — Other Ambulatory Visit: Payer: Self-pay | Admitting: Hematology and Oncology

## 2021-11-13 DIAGNOSIS — Z170 Estrogen receptor positive status [ER+]: Secondary | ICD-10-CM

## 2021-11-14 ENCOUNTER — Encounter: Payer: Self-pay | Admitting: Hematology and Oncology

## 2021-12-22 NOTE — Progress Notes (Signed)
Patient Care Team: Jonathon Jordan, MD as PCP - General (Family Medicine) Nicholas Lose, MD as Consulting Physician (Hematology and Oncology) Kyung Rudd, MD as Consulting Physician (Radiation Oncology) Excell Seltzer, MD (Inactive) as Consulting Physician (General Surgery)  DIAGNOSIS: No diagnosis found.  SUMMARY OF ONCOLOGIC HISTORY: Oncology History  Malignant neoplasm of upper-inner quadrant of left breast in female, estrogen receptor positive ([place])  07/24/2017 Initial Diagnosis   Left breast palpable lump upper inner quadrant 11 o'clock position: Ill-defined 1.6 cm mass, axilla ultrasound negative, ultrasound-guided biopsy: Grade 2 IDC ER 90%, PR 40%, Ki-67 15%, HER-2 negative   08/09/2017 Genetic Testing   Negative genetic testing common hereditary cancer panel.  The Hereditary Gene Panel offered by [facility] includes sequencing and/or deletion duplication testing of the following 47 genes: APC, ATM, AXIN2, BARD1, BMPR1A, BRCA1, BRCA2, BRIP1, CDH1, CDK4, CDKN2A (p14ARF), CDKN2A (p16INK4a), CHEK2, CTNNA1, DICER1, EPCAM (Deletion/duplication testing only), GREM1 (promoter region deletion/duplication testing only), KIT, MEN1, MLH1, MSH2, MSH3, MSH6, MUTYH, NBN, NF1, NHTL1, PALB2, PDGFRA, PMS2, POLD1, POLE, PTEN, RAD50, RAD51C, RAD51D, SDHB, SDHC, SDHD, SMAD4, SMARCA4. STK11, TP53, TSC1, TSC2, and VHL.  The following genes were evaluated for sequence changes only: SDHA and HOXB13 c.251G>A variant only. The report date is August 07, 2017.   08/16/2017 Surgery   Left lumpectomy: IDC grade 2, 2.5 cm, DCIS intermediate grade, lymphovascular invasion identified, margins negative, 1/2 lymph nodes positive with extracapsular extension, ER 90%, PR 40%, HER-2 negative ratio 1.15, Ki-67 15% T2 N1a stage II a; Mammaprint high risk    08/16/2017 Miscellaneous   MammaPrint 08/16/17  High risk with average 10-year Risk of recurrence Untreated: 29% MPI: -0.399 94.6% benefit of chemotherapy and Hormonal  therapy at 5 years from distant recurrence.    09/12/2017 - 01/31/2018 Chemotherapy   Adjuvant chemotherapy with dose dense Adriamycin and Cytoxan x4 followed by Taxol weekly x12   02/13/2018 Cancer Staging   Staging form: Breast, AJCC 8th Edition - Pathologic: Stage IB (pT2, pN1a(sn), cM0, G2, ER+, PR+, HER2-) - Signed by Nicholas Lose, MD on 07/20/2020 Neoadjuvant therapy: No Method of lymph node assessment: Sentinel lymph node biopsy Multigene prognostic tests performed: MammaPrint Histologic grading system: 3 grade system Laterality: Left Tumor size (mm): 25   02/24/2018 - 04/09/2018 Radiation Therapy   Radiation therapy with Dr. Lisbeth Renshaw 02/24/18-04/09/18   05/13/2018 Surgery   Bilateral salpingo-oophorectomy with lysis of left adnexal adhesions   05/28/2018 -  Anti-estrogen oral therapy   Anastrozole: Discontinued due to joint pains and stiffness, switched to letrozole 07/2018   08/22/2018 Miscellaneous   Abemaciclib started 08/21/2020 dose reduced 09/05/2020     CHIEF COMPLIANT: Follow-up of left breast cancer on letrozole and abemaciclib  INTERVAL HISTORY: [name] is a 41 y.o. with above-mentioned history of left breast cancer treated with lumpectomy, adjuvant chemotherapy, radiation, and bilateral salpingo-oophorectomy. She is currently on anti-estrogen therapy with letrozole and abemaciclib. She presents to the clinic today for follow-up   ALLERGIES:  has no allergies on file.  MEDICATIONS:  Current Outpatient Medications  Medication Sig Dispense Refill   ALPRAZolam (XANAX) 0.25 MG tablet Take 1 tablet (0.25 mg total) by mouth at bedtime as needed for anxiety. 30 tablet 3   baclofen (LIORESAL) 10 MG tablet Take 1 tablet (10 mg total) by mouth 3 (three) times daily as needed for muscle spasms. 30 each 1   escitalopram (LEXAPRO) 20 MG tablet TAKE 1 TABLET BY MOUTH DAILY WITH SUPPER. 90 tablet 3   letrozole (FEMARA) 2.5 MG tablet TAKE  1 TABLET BY MOUTH EVERY DAY 90 tablet 3    valACYclovir (VALTREX) 1000 MG tablet TAKE 1 TABLET BY MOUTH TWICE A DAY 180 tablet 1   VERZENIO 100 MG tablet TAKE 1 TABLET BY MOUTH 2 TIMES A DAY 56 tablet 1   No current facility-administered medications for this visit.   Facility-Administered Medications Ordered in Other Visits  Medication Dose Route Frequency Provider Last Rate Last Admin   heparin lock flush 100 unit/mL  500 Units Intracatheter Once PRN Nicholas Lose, MD       sodium chloride flush (NS) 0.9 % injection 10 mL  10 mL Intracatheter PRN Nicholas Lose, MD   10 mL at 11/15/17 1449   sodium chloride flush (NS) 0.9 % injection 10 mL  10 mL Intracatheter PRN Nicholas Lose, MD       sodium chloride flush (NS) 0.9 % injection 10 mL  10 mL Intracatheter PRN Nicholas Lose, MD   10 mL at 12/13/17 1500    PHYSICAL EXAMINATION: ECOG PERFORMANCE STATUS: {CHL ONC ECOG PS:[phone]}  There were no vitals filed for this visit. There were no vitals filed for this visit.  BREAST:*** No palpable masses or nodules in either right or left breasts. No palpable axillary supraclavicular or infraclavicular adenopathy no breast tenderness or nipple discharge. (exam performed in the presence of a chaperone)  LABORATORY DATA:  I have reviewed the data as listed    Latest Ref Rng & Units 10/27/2021    1:34 PM 09/25/2021    8:03 AM 06/19/2021   10:05 AM  CMP  Glucose 70 - 99 mg/dL 114  105  93   BUN 6 - 20 mg/dL $Remove'15  14  20   'BaayLjT$ Creatinine 0.44 - 1.00 mg/dL 1.11  1.20  1.04   Sodium 135 - 145 mmol/L 141  141  139   Potassium 3.5 - 5.1 mmol/L 3.7  4.0  4.9   Chloride 98 - 111 mmol/L 106  105  105   CO2 22 - 32 mmol/L $RemoveB'29  29  28   'XAVQvSkP$ Calcium 8.9 - 10.3 mg/dL 9.3  9.5  9.4   Total Protein 6.5 - 8.1 g/dL 7.1  6.7  7.6   Total Bilirubin 0.3 - 1.2 mg/dL 0.5  0.3  0.8   Alkaline Phos 38 - 126 U/L 73  88  76   AST 15 - 41 U/L $Remo'28  13  21   'UzppX$ ALT 0 - 44 U/L 35  11  15     Lab Results  Component Value Date   WBC 4.6 10/27/2021   HGB 12.0 10/27/2021    HCT 33.6 (L) 10/27/2021   MCV 90.8 10/27/2021   PLT 164 10/27/2021   NEUTROABS 2.2 10/27/2021    ASSESSMENT & PLAN:  No problem-specific Assessment & Plan notes found for this encounter.    No orders of the defined types were placed in this encounter.  The patient has a good understanding of the overall plan. she agrees with it. she will call with any problems that may develop before the next visit here. Total time spent: 30 mins including face to face time and time spent for planning, charting and co-ordination of care   Suzzette Righter, [place] 12/22/21    I Gardiner Coins am scribing for Dr. Lindi Adie  ***

## 2021-12-26 ENCOUNTER — Other Ambulatory Visit: Payer: Self-pay

## 2021-12-26 ENCOUNTER — Inpatient Hospital Stay: Payer: Commercial Managed Care - PPO | Attending: Hematology and Oncology

## 2021-12-26 ENCOUNTER — Inpatient Hospital Stay: Payer: Commercial Managed Care - PPO | Admitting: Hematology and Oncology

## 2021-12-26 VITALS — BP 132/85 | HR 73 | Temp 97.7°F | Resp 18 | Ht 66.0 in | Wt 237.7 lb

## 2021-12-26 DIAGNOSIS — Z79899 Other long term (current) drug therapy: Secondary | ICD-10-CM | POA: Diagnosis not present

## 2021-12-26 DIAGNOSIS — Z170 Estrogen receptor positive status [ER+]: Secondary | ICD-10-CM | POA: Insufficient documentation

## 2021-12-26 DIAGNOSIS — Z923 Personal history of irradiation: Secondary | ICD-10-CM | POA: Insufficient documentation

## 2021-12-26 DIAGNOSIS — Z79811 Long term (current) use of aromatase inhibitors: Secondary | ICD-10-CM | POA: Diagnosis not present

## 2021-12-26 DIAGNOSIS — C50212 Malignant neoplasm of upper-inner quadrant of left female breast: Secondary | ICD-10-CM | POA: Insufficient documentation

## 2021-12-26 DIAGNOSIS — Z9221 Personal history of antineoplastic chemotherapy: Secondary | ICD-10-CM | POA: Diagnosis not present

## 2021-12-26 DIAGNOSIS — Z79624 Long term (current) use of inhibitors of nucleotide synthesis: Secondary | ICD-10-CM | POA: Diagnosis not present

## 2021-12-26 LAB — CBC WITH DIFFERENTIAL (CANCER CENTER ONLY)
Abs Immature Granulocytes: 0.02 10*3/uL (ref 0.00–0.07)
Basophils Absolute: 0.1 10*3/uL (ref 0.0–0.1)
Basophils Relative: 3 %
Eosinophils Absolute: 0 10*3/uL (ref 0.0–0.5)
Eosinophils Relative: 1 %
HCT: 33 % — ABNORMAL LOW (ref 36.0–46.0)
Hemoglobin: 11.8 g/dL — ABNORMAL LOW (ref 12.0–15.0)
Immature Granulocytes: 1 %
Lymphocytes Relative: 42 %
Lymphs Abs: 1.5 10*3/uL (ref 0.7–4.0)
MCH: 32.9 pg (ref 26.0–34.0)
MCHC: 35.8 g/dL (ref 30.0–36.0)
MCV: 91.9 fL (ref 80.0–100.0)
Monocytes Absolute: 0.2 10*3/uL (ref 0.1–1.0)
Monocytes Relative: 7 %
Neutro Abs: 1.6 10*3/uL — ABNORMAL LOW (ref 1.7–7.7)
Neutrophils Relative %: 46 %
Platelet Count: 174 10*3/uL (ref 150–400)
RBC: 3.59 MIL/uL — ABNORMAL LOW (ref 3.87–5.11)
RDW: 12.7 % (ref 11.5–15.5)
WBC Count: 3.4 10*3/uL — ABNORMAL LOW (ref 4.0–10.5)
nRBC: 0 % (ref 0.0–0.2)

## 2021-12-26 LAB — CMP (CANCER CENTER ONLY)
ALT: 27 U/L (ref 0–44)
AST: 26 U/L (ref 15–41)
Albumin: 4.1 g/dL (ref 3.5–5.0)
Alkaline Phosphatase: 69 U/L (ref 38–126)
Anion gap: 4 — ABNORMAL LOW (ref 5–15)
BUN: 16 mg/dL (ref 6–20)
CO2: 29 mmol/L (ref 22–32)
Calcium: 9.1 mg/dL (ref 8.9–10.3)
Chloride: 106 mmol/L (ref 98–111)
Creatinine: 1.16 mg/dL — ABNORMAL HIGH (ref 0.44–1.00)
GFR, Estimated: >60 mL/min (ref >60)
Glucose, Bld: 100 mg/dL — ABNORMAL HIGH (ref 70–99)
Potassium: 4.2 mmol/L (ref 3.5–5.1)
Sodium: 139 mmol/L (ref 135–145)
Total Bilirubin: 0.6 mg/dL (ref 0.3–1.2)
Total Protein: 6.9 g/dL (ref 6.5–8.1)

## 2021-12-26 MED ORDER — ALPRAZOLAM 0.25 MG PO TABS: 0.2500 mg | ORAL_TABLET | Freq: Every evening | ORAL | 3 refills | Status: DC | PRN

## 2021-12-26 NOTE — Assessment & Plan Note (Signed)
08/19/2017:Left lumpectomy: IDC grade 2, 2.5 cm, DCIS intermediate grade, lymphovascular invasion identified, margins negative, 1/2 lymph nodes positive with extracapsular extension, ER 90%, PR 40%, HER-2 negative ratio 1.15, Ki-67 15% T2 N1a stage II a; Mammaprint high risk   Recommendation: 1. Adjuvant chemotherapy with dose dense Adriamycin and Cytoxan x4 followed by Taxol weekly x12 completed 01/31/2018 2. followed by adjuvant radiation therapywhich will be completed 04/09/2018 3.Bilateral salpingo-oophorectomy 05/15/2018 4.Followed by adjuvant antiestrogen therapy;along with abemaciclib _______________________________________________________________________________________________  Current treatment:Anastrozole 1 mg daily started 06/02/2018 discontinued 07/02/2018 due to diffuse muscle aches and pains,switched to letrozole 07/14/2018 Abemaciclib based on clinical trial [facility]  Letrozole toxicities:Occasional hot flashes and mild muscle stiffness but significant improvement from anastrozole.  Arthralgias/myalgias:Much improved on letrozole. Ulnar nerve neuropathy:Taking turmeric Bone density 07/01/2018: Normal T score 0.1  Breast cancer surveillance: 1.Breast MRI2/21/2022:Benign breast density category B(we will skip MRI next year and obtain it in 2024.) 2.Breast exam2/23/2022: Benign 3.Mammogram8/9/2022at [facility]: Benign, breast density category C 4. Breast MRI February 2023.  New indeterminate 0.5 cm L RUQ left breast mass biopsy 07/05/2021: Benign fat necrosis We will plan to do breast MRIs every other year.  After discussing pros and cons she decided not to do the Signatera test.  AbemaciclibToxicities: Started 07/20/2020 1.Diarrhea:Tolerating the dosage ofVerzenio to 100 mg twice dailyvery well.The maximum diarrhea she gets is 1-2 timesonce every few days 2.slight anemia: Hemoglobin is 11.7  Return to clinic in3 monthswith labs and follow up

## 2021-12-27 ENCOUNTER — Telehealth: Payer: Self-pay | Admitting: Hematology and Oncology

## 2021-12-27 NOTE — Telephone Encounter (Signed)
Scheduled appointment per 8/1 los. Patient is aware.

## 2022-01-25 ENCOUNTER — Other Ambulatory Visit: Payer: Self-pay | Admitting: Hematology and Oncology

## 2022-01-25 DIAGNOSIS — Z170 Estrogen receptor positive status [ER+]: Secondary | ICD-10-CM

## 2022-03-06 ENCOUNTER — Telehealth: Payer: Self-pay | Admitting: Pharmacy Technician

## 2022-03-06 ENCOUNTER — Other Ambulatory Visit (HOSPITAL_COMMUNITY): Payer: Self-pay

## 2022-03-06 ENCOUNTER — Encounter: Payer: Self-pay | Admitting: Hematology and Oncology

## 2022-03-06 NOTE — Telephone Encounter (Signed)
Oral Oncology Patient Advocate Encounter   Received notification that prior authorization for Verzenio is required.   PA submitted on 03/06/2022 Key [id number] Status is pending     Lady Deutscher, CPhT-Adv Oncology Pharmacy Patient [place] Direct Number: [phone]  Fax: [phone]

## 2022-03-07 ENCOUNTER — Other Ambulatory Visit (HOSPITAL_COMMUNITY): Payer: Self-pay

## 2022-03-07 NOTE — Telephone Encounter (Signed)
Oral Oncology Patient Advocate Encounter  Prior Authorization for [name] has been approved.    PA# [id number] Effective dates: 03/06/2022 through 08/05/2022  Patient must continue to fill at [facility].   I have notified the pharmacy of the approval.   Lady Deutscher, CPhT-Adv Oncology Pharmacy Patient [place] Direct Number: [phone]  Fax: [phone]

## 2022-03-16 ENCOUNTER — Other Ambulatory Visit: Payer: Self-pay | Admitting: Hematology and Oncology

## 2022-03-16 DIAGNOSIS — Z170 Estrogen receptor positive status [ER+]: Secondary | ICD-10-CM

## 2022-03-28 ENCOUNTER — Encounter: Payer: Self-pay | Admitting: Hematology and Oncology

## 2022-03-28 NOTE — Progress Notes (Signed)
Patient Care Team: Jonathon Jordan, MD as PCP - General (Family Medicine) Nicholas Lose, MD as Consulting Physician (Hematology and Oncology) Kyung Rudd, MD as Consulting Physician (Radiation Oncology) Excell Seltzer, MD (Inactive) as Consulting Physician (General Surgery)  DIAGNOSIS: No diagnosis found.  SUMMARY OF ONCOLOGIC HISTORY: Oncology History  Malignant neoplasm of upper-inner quadrant of left breast in female, estrogen receptor positive ([place])  07/24/2017 Initial Diagnosis   Left breast palpable lump upper inner quadrant 11 o'clock position: Ill-defined 1.6 cm mass, axilla ultrasound negative, ultrasound-guided biopsy: Grade 2 IDC ER 90%, PR 40%, Ki-67 15%, HER-2 negative   08/09/2017 Genetic Testing   Negative genetic testing common hereditary cancer panel.  The Hereditary Gene Panel offered by [facility] includes sequencing and/or deletion duplication testing of the following 47 genes: APC, ATM, AXIN2, BARD1, BMPR1A, BRCA1, BRCA2, BRIP1, CDH1, CDK4, CDKN2A (p14ARF), CDKN2A (p16INK4a), CHEK2, CTNNA1, DICER1, EPCAM (Deletion/duplication testing only), GREM1 (promoter region deletion/duplication testing only), KIT, MEN1, MLH1, MSH2, MSH3, MSH6, MUTYH, NBN, NF1, NHTL1, PALB2, PDGFRA, PMS2, POLD1, POLE, PTEN, RAD50, RAD51C, RAD51D, SDHB, SDHC, SDHD, SMAD4, SMARCA4. STK11, TP53, TSC1, TSC2, and VHL.  The following genes were evaluated for sequence changes only: SDHA and HOXB13 c.251G>A variant only. The report date is August 07, 2017.   08/16/2017 Surgery   Left lumpectomy: IDC grade 2, 2.5 cm, DCIS intermediate grade, lymphovascular invasion identified, margins negative, 1/2 lymph nodes positive with extracapsular extension, ER 90%, PR 40%, HER-2 negative ratio 1.15, Ki-67 15% T2 N1a stage II a; Mammaprint high risk    08/16/2017 Miscellaneous   MammaPrint 08/16/17  High risk with average 10-year Risk of recurrence Untreated: 29% MPI: -0.399 94.6% benefit of chemotherapy and Hormonal  therapy at 5 years from distant recurrence.    09/12/2017 - 01/31/2018 Chemotherapy   Adjuvant chemotherapy with dose dense Adriamycin and Cytoxan x4 followed by Taxol weekly x12   02/13/2018 Cancer Staging   Staging form: Breast, AJCC 8th Edition - Pathologic: Stage IB (pT2, pN1a(sn), cM0, G2, ER+, PR+, HER2-) - Signed by Nicholas Lose, MD on 07/20/2020 Neoadjuvant therapy: No Method of lymph node assessment: Sentinel lymph node biopsy Multigene prognostic tests performed: MammaPrint Histologic grading system: 3 grade system Laterality: Left Tumor size (mm): 25   02/24/2018 - 04/09/2018 Radiation Therapy   Radiation therapy with Dr. Lisbeth Renshaw 02/24/18-04/09/18   05/13/2018 Surgery   Bilateral salpingo-oophorectomy with lysis of left adnexal adhesions   05/28/2018 -  Anti-estrogen oral therapy   Anastrozole: Discontinued due to joint pains and stiffness, switched to letrozole 07/2018   08/22/2018 Miscellaneous   Abemaciclib started 08/21/2020 dose reduced 09/05/2020     CHIEF COMPLIANT: Follow-up of left breast cancer on letrozole and abemaciclib   INTERVAL HISTORY: [name] is a 41 y.o. with above-mentioned history of left breast cancer treated with lumpectomy, adjuvant chemotherapy, radiation, and bilateral salpingo-oophorectomy. She is currently on anti-estrogen therapy with letrozole and abemaciclib. She presents to the clinic today for follow-up.    ALLERGIES:  has no allergies on file.  MEDICATIONS:  Current Outpatient Medications  Medication Sig Dispense Refill   ALPRAZolam (XANAX) 0.25 MG tablet Take 1 tablet (0.25 mg total) by mouth at bedtime as needed for anxiety. 30 tablet 3   baclofen (LIORESAL) 10 MG tablet Take 1 tablet (10 mg total) by mouth 3 (three) times daily as needed for muscle spasms. 30 each 1   escitalopram (LEXAPRO) 20 MG tablet TAKE 1 TABLET BY MOUTH DAILY WITH SUPPER. 90 tablet 3   letrozole (FEMARA) 2.5 MG  tablet TAKE 1 TABLET BY MOUTH EVERY DAY 90 tablet  3   valACYclovir (VALTREX) 1000 MG tablet TAKE 1 TABLET BY MOUTH TWICE A DAY 180 tablet 1   VERZENIO 100 MG tablet TAKE 1 TABLET BY MOUTH 2 TIMES A DAY 56 tablet 1   No current facility-administered medications for this visit.   Facility-Administered Medications Ordered in Other Visits  Medication Dose Route Frequency Provider Last Rate Last Admin   heparin lock flush 100 unit/mL  500 Units Intracatheter Once PRN Nicholas Lose, MD       sodium chloride flush (NS) 0.9 % injection 10 mL  10 mL Intracatheter PRN Nicholas Lose, MD   10 mL at 11/15/17 1449   sodium chloride flush (NS) 0.9 % injection 10 mL  10 mL Intracatheter PRN Nicholas Lose, MD       sodium chloride flush (NS) 0.9 % injection 10 mL  10 mL Intracatheter PRN Nicholas Lose, MD   10 mL at 12/13/17 1500    PHYSICAL EXAMINATION: ECOG PERFORMANCE STATUS: {CHL ONC ECOG PS:[id number]}  There were no vitals filed for this visit. There were no vitals filed for this visit.  BREAST:*** No palpable masses or nodules in either right or left breasts. No palpable axillary supraclavicular or infraclavicular adenopathy no breast tenderness or nipple discharge. (exam performed in the presence of a chaperone)  LABORATORY DATA:  I have reviewed the data as listed    Latest Ref Rng & Units 12/26/2021    9:55 AM 10/27/2021    1:34 PM 09/25/2021    8:03 AM  CMP  Glucose 70 - 99 mg/dL 100  114  105   BUN 6 - 20 mg/dL _0 Creatinine 0.44 - 1.00 mg/dL 1.16  1.11  1.20   Sodium 135 - 145 mmol/L 139  141  141   Potassium 3.5 - 5.1 mmol/L 4.2  3.7  4.0   Chloride 98 - 111 mmol/L 106  106  105   CO2 22 - 32 mmol/L _1 Calcium 8.9 - 10.3 mg/dL 9.1  9.3  9.5   Total Protein 6.5 - 8.1 g/dL 6.9  7.1  6.7   Total Bilirubin 0.3 - 1.2 mg/dL 0.6  0.5  0.3   Alkaline Phos 38 - 126 U/L 69  73  88   AST 15 - 41 U/L _2 ALT 0 - 44 U/L 27  35  11     Lab Results  Component Value Date   WBC 3.4 (L) 12/26/2021   HGB 11.8 (L)  12/26/2021   HCT 33.0 (L) 12/26/2021   MCV 91.9 12/26/2021   PLT 174 12/26/2021   NEUTROABS 1.6 (L) 12/26/2021    ASSESSMENT & PLAN:  No problem-specific Assessment & Plan notes found for this encounter.    No orders of the defined types were placed in this encounter.  The patient has a good understanding of the overall plan. she agrees with it. she will call with any problems that may develop before the next visit here. Total time spent: 30 mins including face to face time and time spent for planning, charting and co-ordination of care   Suzzette Righter, [place] 03/28/22    I Gardiner Coins am scribing for Dr. Lindi Adie  ***

## 2022-03-29 ENCOUNTER — Inpatient Hospital Stay: Payer: Managed Care, Other (non HMO) | Admitting: Hematology and Oncology

## 2022-03-29 ENCOUNTER — Inpatient Hospital Stay: Payer: Managed Care, Other (non HMO)

## 2022-04-04 ENCOUNTER — Inpatient Hospital Stay (HOSPITAL_BASED_OUTPATIENT_CLINIC_OR_DEPARTMENT_OTHER): Payer: Managed Care, Other (non HMO) | Admitting: Hematology and Oncology

## 2022-04-04 ENCOUNTER — Inpatient Hospital Stay: Payer: Managed Care, Other (non HMO) | Attending: Hematology and Oncology

## 2022-04-04 ENCOUNTER — Other Ambulatory Visit: Payer: Self-pay

## 2022-04-04 ENCOUNTER — Encounter: Payer: Self-pay | Admitting: Hematology and Oncology

## 2022-04-04 VITALS — BP 134/74 | HR 73 | Temp 97.5°F | Resp 18 | Ht 66.0 in | Wt 241.8 lb

## 2022-04-04 DIAGNOSIS — Z9079 Acquired absence of other genital organ(s): Secondary | ICD-10-CM | POA: Diagnosis not present

## 2022-04-04 DIAGNOSIS — Z9221 Personal history of antineoplastic chemotherapy: Secondary | ICD-10-CM | POA: Diagnosis not present

## 2022-04-04 DIAGNOSIS — Z79811 Long term (current) use of aromatase inhibitors: Secondary | ICD-10-CM | POA: Diagnosis not present

## 2022-04-04 DIAGNOSIS — C50212 Malignant neoplasm of upper-inner quadrant of left female breast: Secondary | ICD-10-CM | POA: Insufficient documentation

## 2022-04-04 DIAGNOSIS — Z170 Estrogen receptor positive status [ER+]: Secondary | ICD-10-CM

## 2022-04-04 DIAGNOSIS — Z79624 Long term (current) use of inhibitors of nucleotide synthesis: Secondary | ICD-10-CM | POA: Diagnosis not present

## 2022-04-04 DIAGNOSIS — Z79899 Other long term (current) drug therapy: Secondary | ICD-10-CM | POA: Insufficient documentation

## 2022-04-04 DIAGNOSIS — Z923 Personal history of irradiation: Secondary | ICD-10-CM | POA: Insufficient documentation

## 2022-04-04 DIAGNOSIS — Z90722 Acquired absence of ovaries, bilateral: Secondary | ICD-10-CM | POA: Diagnosis not present

## 2022-04-04 LAB — CBC WITH DIFFERENTIAL (CANCER CENTER ONLY)
Abs Immature Granulocytes: 0.01 10*3/uL (ref 0.00–0.07)
Basophils Absolute: 0.1 10*3/uL (ref 0.0–0.1)
Basophils Relative: 1 %
Eosinophils Absolute: 0.1 10*3/uL (ref 0.0–0.5)
Eosinophils Relative: 2 %
HCT: 32.2 % — ABNORMAL LOW (ref 36.0–46.0)
Hemoglobin: 11.4 g/dL — ABNORMAL LOW (ref 12.0–15.0)
Immature Granulocytes: 0 %
Lymphocytes Relative: 36 %
Lymphs Abs: 1.4 10*3/uL (ref 0.7–4.0)
MCH: 33.2 pg (ref 26.0–34.0)
MCHC: 35.4 g/dL (ref 30.0–36.0)
MCV: 93.9 fL (ref 80.0–100.0)
Monocytes Absolute: 0.4 10*3/uL (ref 0.1–1.0)
Monocytes Relative: 9 %
Neutro Abs: 2 10*3/uL (ref 1.7–7.7)
Neutrophils Relative %: 52 %
Platelet Count: 138 10*3/uL — ABNORMAL LOW (ref 150–400)
RBC: 3.43 MIL/uL — ABNORMAL LOW (ref 3.87–5.11)
RDW: 12.6 % (ref 11.5–15.5)
WBC Count: 3.9 10*3/uL — ABNORMAL LOW (ref 4.0–10.5)
nRBC: 0 % (ref 0.0–0.2)

## 2022-04-04 LAB — CMP (CANCER CENTER ONLY)
ALT: 27 U/L (ref 0–44)
AST: 25 U/L (ref 15–41)
Albumin: 3.9 g/dL (ref 3.5–5.0)
Alkaline Phosphatase: 74 U/L (ref 38–126)
Anion gap: 9 (ref 5–15)
BUN: 14 mg/dL (ref 6–20)
CO2: 27 mmol/L (ref 22–32)
Calcium: 9.3 mg/dL (ref 8.9–10.3)
Chloride: 104 mmol/L (ref 98–111)
Creatinine: 1.26 mg/dL — ABNORMAL HIGH (ref 0.44–1.00)
GFR, Estimated: 55 mL/min — ABNORMAL LOW (ref >60)
Glucose, Bld: 99 mg/dL (ref 70–99)
Potassium: 4 mmol/L (ref 3.5–5.1)
Sodium: 140 mmol/L (ref 135–145)
Total Bilirubin: 0.5 mg/dL (ref 0.3–1.2)
Total Protein: 7.2 g/dL (ref 6.5–8.1)

## 2022-04-04 MED ORDER — TRETINOIN 0.1 % EX CREA: TOPICAL_CREAM | Freq: Every day | CUTANEOUS | 11 refills | Status: DC

## 2022-04-04 NOTE — Assessment & Plan Note (Addendum)
08/19/2017:Left lumpectomy: IDC grade 2, 2.5 cm, DCIS intermediate grade, lymphovascular invasion identified, margins negative, 1/2 lymph nodes positive with extracapsular extension, ER 90%, PR 40%, HER-2 negative ratio 1.15, Ki-67 15% T2 N1a stage II a; Mammaprint high risk    Recommendation: 1.  Adjuvant chemotherapy with dose dense Adriamycin and Cytoxan x4 followed by Taxol weekly x12 completed 01/31/2018 2. followed by adjuvant radiation therapy which will be completed 04/09/2018 3.  Bilateral salpingo-oophorectomy 05/15/2018  4. Followed by adjuvant antiestrogen therapy; along with abemaciclib _______________________________________________________________________________________________   Current treatment: Anastrozole 1 mg daily started 06/02/2018 discontinued 07/02/2018 due to diffuse muscle aches and pains, switched to letrozole 07/14/2018 Abemaciclib based on clinical trial [facility]   Letrozole toxicities: Denies any hot flashes or    Bone density 07/01/2018: Normal T score 0.1   Breast cancer surveillance: 1. Breast MRI  07/18/2020: Benign breast density category B (we will skip MRI next year and obtain it in 2024.) 2.  Breast exam 07/20/2020: Benign 3.  [facility]: Benign, breast density category C 4. Breast MRI February 2023.  New indeterminate 0.5 cm L RUQ left breast mass biopsy 07/05/2021: Benign fat necrosis We will do breast MRI in 2024 February.  If that is negative then we will do MRIs every other year.   After discussing pros and cons she decided not to do the Signatera test.    Abemaciclib Toxicities: Started 07/20/2020 1.  Diarrhea: Tolerating the dosage of Verzenio to 100 mg twice daily very well.  The maximum diarrhea she gets is 1-2 times per week 2. slight anemia: Hemoglobin is 11.8   Return to clinic in 3 months with labs and follow up

## 2022-04-05 ENCOUNTER — Telehealth: Payer: Self-pay | Admitting: Hematology and Oncology

## 2022-04-05 NOTE — Telephone Encounter (Signed)
Scheduled appointment per 11/8 los. Left voicemail.

## 2022-04-10 ENCOUNTER — Other Ambulatory Visit: Payer: Self-pay | Admitting: *Deleted

## 2022-04-10 MED ORDER — CIPROFLOXACIN HCL 500 MG PO TABS: 500.0000 mg | ORAL_TABLET | Freq: Two times a day (BID) | ORAL | 0 refills | Status: DC

## 2022-04-10 NOTE — Telephone Encounter (Signed)
Pt called with c/o painful and burning upon urination as well as urine being cloudy. Pt has been taking OTC AZO and drinking plenty of fluids without relief. Per Dr. Lindi Adie, prescription sent to pharmacy for Cipro '500mg'$  BID Pt notified.

## 2022-05-29 ENCOUNTER — Other Ambulatory Visit: Payer: Self-pay | Admitting: Hematology and Oncology

## 2022-05-29 DIAGNOSIS — C50212 Malignant neoplasm of upper-inner quadrant of left female breast: Secondary | ICD-10-CM

## 2022-06-27 ENCOUNTER — Other Ambulatory Visit (HOSPITAL_COMMUNITY): Payer: Self-pay

## 2022-07-02 ENCOUNTER — Encounter: Payer: Self-pay | Admitting: Hematology and Oncology

## 2022-07-04 ENCOUNTER — Ambulatory Visit
Admission: RE | Admit: 2022-07-04 | Discharge: 2022-07-04 | Disposition: A | Discharge status: Home / Self Care | Payer: 59 | Source: Ambulatory Visit | Attending: Hematology and Oncology | Admitting: Hematology and Oncology

## 2022-07-04 DIAGNOSIS — C50212 Malignant neoplasm of upper-inner quadrant of left female breast: Secondary | ICD-10-CM

## 2022-07-04 MED ORDER — GADOPICLENOL 0.5 MMOL/ML IV SOLN
10.0000 mL | Freq: Once | INTRAVENOUS | Status: AC | PRN
  Administered 2022-07-04: 10 mL via INTRAVENOUS

## 2022-07-09 ENCOUNTER — Inpatient Hospital Stay: Payer: 59 | Attending: Hematology and Oncology

## 2022-07-09 ENCOUNTER — Other Ambulatory Visit: Payer: Self-pay

## 2022-07-09 ENCOUNTER — Telehealth: Payer: Self-pay | Admitting: Hematology and Oncology

## 2022-07-09 ENCOUNTER — Inpatient Hospital Stay: Payer: 59 | Admitting: Hematology and Oncology

## 2022-07-09 VITALS — BP 120/83 | HR 78 | Temp 97.0°F | Resp 18 | Wt 242.1 lb

## 2022-07-09 DIAGNOSIS — Z79899 Other long term (current) drug therapy: Secondary | ICD-10-CM | POA: Diagnosis not present

## 2022-07-09 DIAGNOSIS — C50212 Malignant neoplasm of upper-inner quadrant of left female breast: Secondary | ICD-10-CM | POA: Diagnosis present

## 2022-07-09 DIAGNOSIS — Z9221 Personal history of antineoplastic chemotherapy: Secondary | ICD-10-CM | POA: Insufficient documentation

## 2022-07-09 DIAGNOSIS — Z79811 Long term (current) use of aromatase inhibitors: Secondary | ICD-10-CM | POA: Insufficient documentation

## 2022-07-09 DIAGNOSIS — Z170 Estrogen receptor positive status [ER+]: Secondary | ICD-10-CM

## 2022-07-09 DIAGNOSIS — Z79624 Long term (current) use of inhibitors of nucleotide synthesis: Secondary | ICD-10-CM | POA: Insufficient documentation

## 2022-07-09 DIAGNOSIS — Z923 Personal history of irradiation: Secondary | ICD-10-CM | POA: Diagnosis not present

## 2022-07-09 LAB — CBC WITH DIFFERENTIAL (CANCER CENTER ONLY)
Abs Immature Granulocytes: 0.02 10*3/uL (ref 0.00–0.07)
Basophils Absolute: 0.1 10*3/uL (ref 0.0–0.1)
Basophils Relative: 2 %
Eosinophils Absolute: 0 10*3/uL (ref 0.0–0.5)
Eosinophils Relative: 1 %
HCT: 31.4 % — ABNORMAL LOW (ref 36.0–46.0)
Hemoglobin: 11.4 g/dL — ABNORMAL LOW (ref 12.0–15.0)
Immature Granulocytes: 1 %
Lymphocytes Relative: 42 %
Lymphs Abs: 1.6 10*3/uL (ref 0.7–4.0)
MCH: 33.7 pg (ref 26.0–34.0)
MCHC: 36.3 g/dL — ABNORMAL HIGH (ref 30.0–36.0)
MCV: 92.9 fL (ref 80.0–100.0)
Monocytes Absolute: 0.2 10*3/uL (ref 0.1–1.0)
Monocytes Relative: 5 %
Neutro Abs: 1.9 10*3/uL (ref 1.7–7.7)
Neutrophils Relative %: 49 %
Platelet Count: 148 10*3/uL — ABNORMAL LOW (ref 150–400)
RBC: 3.38 MIL/uL — ABNORMAL LOW (ref 3.87–5.11)
RDW: 13.5 % (ref 11.5–15.5)
WBC Count: 3.8 10*3/uL — ABNORMAL LOW (ref 4.0–10.5)
nRBC: 0 % (ref 0.0–0.2)

## 2022-07-09 LAB — CMP (CANCER CENTER ONLY)
ALT: 39 U/L (ref 0–44)
AST: 31 U/L (ref 15–41)
Albumin: 4 g/dL (ref 3.5–5.0)
Alkaline Phosphatase: 78 U/L (ref 38–126)
Anion gap: 5 (ref 5–15)
BUN: 14 mg/dL (ref 6–20)
CO2: 28 mmol/L (ref 22–32)
Calcium: 9.3 mg/dL (ref 8.9–10.3)
Chloride: 107 mmol/L (ref 98–111)
Creatinine: 1.24 mg/dL — ABNORMAL HIGH (ref 0.44–1.00)
GFR, Estimated: 56 mL/min — ABNORMAL LOW (ref >60)
Glucose, Bld: 112 mg/dL — ABNORMAL HIGH (ref 70–99)
Potassium: 3.8 mmol/L (ref 3.5–5.1)
Sodium: 140 mmol/L (ref 135–145)
Total Bilirubin: 0.5 mg/dL (ref 0.3–1.2)
Total Protein: 6.7 g/dL (ref 6.5–8.1)

## 2022-07-09 NOTE — Assessment & Plan Note (Signed)
08/19/2017:Left lumpectomy: IDC grade 2, 2.5 cm, DCIS intermediate grade, lymphovascular invasion identified, margins negative, 1/2 lymph nodes positive with extracapsular extension, ER 90%, PR 40%, HER-2 negative ratio 1.15, Ki-67 15% T2 N1a stage II a; Mammaprint high risk    Recommendation: 1.  Adjuvant chemotherapy with dose dense Adriamycin and Cytoxan x4 followed by Taxol weekly x12 completed 01/31/2018 2. followed by adjuvant radiation therapy which will be completed 04/09/2018 3.  Bilateral salpingo-oophorectomy 05/15/2018  4. Followed by adjuvant antiestrogen therapy; along with abemaciclib _______________________________________________________________________________________________   Current treatment: Anastrozole 1 mg daily started 06/02/2018 discontinued 07/02/2018 due to diffuse muscle aches and pains, switched to letrozole 07/14/2018 Abemaciclib based on clinical trial [facility]   Letrozole toxicities: Denies any hot flashes   Bone density 07/01/2018: Normal T score 0.1   Breast cancer surveillance: 1. Breast MRI  07/18/2020: Benign breast density category B (we will skip MRI next year and obtain it in 2024.) 2.  Breast exam 07/20/2020: Benign 3.  Mammogram 01/03/2021 at [facility]: Benign, breast density category C 4. Breast MRI February 2023.  New indeterminate 0.5 cm L RUQ left breast mass biopsy 07/05/2021: Benign fat necrosis 5.  Breast MRI 07/04/2022: Benign breast density category B.  Plan to do breast MRIs every other year    After discussing pros and cons she decided not to do the Signatera test.    Abemaciclib Toxicities: Started 07/20/2020 1.  Diarrhea: Tolerating the dosage of Verzenio to 100 mg twice daily very well.  The maximum diarrhea she gets is 1-2 times per week 2. slight anemia: Hemoglobin is 11.8   Return to clinic in 6 months with labs and follow up

## 2022-07-09 NOTE — Telephone Encounter (Signed)
Scheduled appointment per 2/12 los. Left voicemail.

## 2022-07-09 NOTE — Progress Notes (Signed)
Patient Care Team: Sabrina Jordan, MD as PCP - General (Family Medicine) Sabrina Lose, MD as Consulting Physician (Hematology [facility]) Sabrina Rudd, MD as Consulting Physician ([name] Oncology) Sabrina Seltzer, MD (Inactive) as Consulting Physician (General Surgery)  DIAGNOSIS:  Encounter Diagnosis  Name Primary?   Malignant neoplasm of upper-inner quadrant of left breast in female, estrogen receptor positive ([name]) Yes    SUMMARY OF ONCOLOGIC HISTORY: Oncology History  Malignant neoplasm of upper-inner quadrant of left breast in female, estrogen receptor positive ([name])  07/24/2017 Initial Diagnosis   Left breast palpable lump upper inner quadrant 11 o'clock position: Ill-defined 1.6 cm mass, axilla ultrasound negative, ultrasound-guided biopsy: Grade 2 IDC ER 90%, PR 40%, Ki-67 15%, HER-2 negative   08/09/2017 Genetic Testing   Negative genetic testing common hereditary cancer panel.  The Hereditary Gene Panel offered by [facility] includes sequencing Sabrina/or deletion duplication testing of the following 47 genes: APC, ATM, AXIN2, BARD1, BMPR1A, BRCA1, BRCA2, BRIP1, CDH1, CDK4, CDKN2A (p14ARF), CDKN2A (p16INK4a), CHEK2, CTNNA1, DICER1, EPCAM (Deletion/duplication testing only), GREM1 (promoter region deletion/duplication testing only), KIT, MEN1, MLH1, MSH2, MSH3, MSH6, MUTYH, NBN, NF1, NHTL1, PALB2, PDGFRA, PMS2, POLD1, POLE, PTEN, RAD50, RAD51C, RAD51D, SDHB, SDHC, SDHD, SMAD4, SMARCA4. STK11, TP53, TSC1, TSC2, Sabrina VHL.  The following genes were evaluated for sequence changes only: SDHA Sabrina HOXB13 c.251G>A variant only. The report date is August 07, 2017.   08/16/2017 Surgery   Left [name]: IDC grade 2, 2.5 cm, DCIS intermediate grade, lymphovascular invasion identified, margins negative, 1/2 lymph nodes positive with extracapsular extension, ER 90%, PR 40%, HER-2 negative ratio 1.15, Ki-67 15% T2 N1a stage II a; Mammaprint high risk    08/16/2017 Miscellaneous   MammaPrint  08/16/17  High risk with average 10-year Risk of recurrence Untreated: 29% MPI: -0.399 94.6% benefit of [name] Sabrina Hormonal therapy at 5 years from distant recurrence.    09/12/2017 - 01/31/2018 [name]   [name] with dose dense Adriamycin Sabrina Cytoxan x4 followed by Taxol weekly x12   02/13/2018 Cancer Staging   Staging form: Breast, AJCC 8th Edition - Pathologic: Stage IB (pT2, pN1a(sn), cM0, G2, ER+, PR+, HER2-) - Signed by Sabrina Lose, MD on 07/20/2020 Neoadjuvant therapy: No Method of lymph node assessment: Sentinel lymph node biopsy Multigene prognostic tests performed: MammaPrint Histologic grading system: 3 grade system Laterality: Left Tumor size (mm): 25   02/24/2018 - 04/09/2018 [name] Therapy   [name] therapy with Dr. Lisbeth [name] 02/24/18-04/09/18   05/13/2018 Surgery   Bilateral [name] with lysis of left adnexal adhesions   05/28/2018 -  Anti-estrogen oral therapy   Anastrozole: Discontinued due to joint pains Sabrina stiffness, switched to letrozole 07/2018   08/22/2018 Miscellaneous   Abemaciclib started 08/21/2020 dose reduced 09/05/2020     CHIEF COMPLIANT:  Follow-up of left breast cancer on letrozole Sabrina Verzenio     INTERVAL HISTORY: [name] is a 42 y.o. with above-mentioned history of left breast cancer treated with [name], [name], [name], [name]. She is currently on anti-estrogen therapy with letrozole Sabrina abemaciclib. She presents to the clinic today for follow-up.  She reports that she has tolerated [name] extremely well at the current dosage.  She has completed 2 years of therapy.  She recently underwent a breast MRI which was normal.   ALLERGIES:  has no allergies on file.  MEDICATIONS:  Current Outpatient Medications  Medication Sig Dispense Refill   ALPRAZolam (XANAX) 0.25 MG tablet Take 1 tablet (0.25 mg total) by mouth at bedtime as needed  for anxiety. 30  tablet 3   escitalopram (LEXAPRO) 20 MG tablet TAKE 1 TABLET BY MOUTH DAILY WITH SUPPER. 90 tablet 3   letrozole (FEMARA) 2.5 MG tablet TAKE 1 TABLET BY MOUTH EVERY DAY 90 tablet 3   tretinoin (RETIN-A) 0.1 % cream Apply topically at bedtime. 45 g 11   valACYclovir (VALTREX) 1000 MG tablet TAKE 1 TABLET BY MOUTH TWICE A DAY 180 tablet 1   No current facility-administered medications for this visit.   Facility-Administered Medications Ordered in Other Visits  Medication Dose Route Frequency Provider Last Rate Last Admin   heparin lock flush 100 unit/mL  500 Units Intracatheter Once PRN Sabrina Lose, MD       sodium chloride flush (NS) 0.9 % injection 10 mL  10 mL Intracatheter PRN Sabrina Lose, MD   10 mL at 11/15/17 1449   sodium chloride flush (NS) 0.9 % injection 10 mL  10 mL Intracatheter PRN Sabrina Lose, MD       sodium chloride flush (NS) 0.9 % injection 10 mL  10 mL Intracatheter PRN Sabrina Lose, MD   10 mL at 12/13/17 1500    PHYSICAL EXAMINATION: ECOG PERFORMANCE STATUS: 1 - Symptomatic but completely ambulatory  Vitals:   07/09/22 1417  BP: 120/83  Pulse: 78  Resp: 18  Temp: (!) 97 F (36.1 C)  SpO2: 98%   Filed Weights   07/09/22 1417  Weight: 242 lb 1 oz (109.8 kg)      LABORATORY DATA:  I have reviewed the data as listed    Latest Ref Rng & Units 07/09/2022    1:51 PM 04/04/2022    2:06 PM 12/26/2021    9:55 AM  CMP  Glucose 70 - 99 mg/dL 112  99  100   BUN 6 - 20 mg/dL 14  14  16   $ Creatinine 0.44 - 1.00 mg/dL 1.24  1.26  1.16   Sodium 135 - 145 mmol/L 140  140  139   Potassium 3.5 - 5.1 mmol/L 3.8  4.0  4.2   Chloride 98 - 111 mmol/L 107  104  106   CO2 22 - 32 mmol/L 28  27  29   $ Calcium 8.9 - 10.3 mg/dL 9.3  9.3  9.1   Total Protein 6.5 - 8.1 g/dL 6.7  7.2  6.9   Total Bilirubin 0.3 - 1.2 mg/dL 0.5  0.5  0.6   Alkaline Phos 38 - 126 U/L 78  74  69   AST 15 - 41 U/L 31  25  26   $ ALT 0 - 44 U/L 39  27  27     Lab Results  Component Value Date    WBC 3.8 (L) 07/09/2022   HGB 11.4 (L) 07/09/2022   HCT 31.4 (L) 07/09/2022   MCV 92.9 07/09/2022   PLT 148 (L) 07/09/2022   NEUTROABS 1.9 07/09/2022    ASSESSMENT & PLAN:  Malignant neoplasm of upper-inner quadrant of left breast in female, estrogen receptor positive ([place]) 08/19/2017:Left [name]: IDC grade 2, 2.5 cm, DCIS intermediate grade, lymphovascular invasion identified, margins negative, 1/2 lymph nodes positive with extracapsular extension, ER 90%, PR 40%, HER-2 negative ratio 1.15, Ki-67 15% T2 N1a stage II a; Mammaprint high risk    Recommendation: 1.  [name] with dose dense Adriamycin Sabrina Cytoxan x4 followed by Taxol weekly x12 completed 01/31/2018 2. followed by Sabrina [name] therapy which will be completed 04/09/2018 3.  Bilateral [name] 05/15/2018  4. Followed by Sabrina  antiestrogen therapy; along with abemaciclib _______________________________________________________________________________________________   Current treatment: Anastrozole 1 mg daily started 06/02/2018 discontinued 07/02/2018 due to diffuse muscle aches Sabrina pains, switched to letrozole 07/14/2018 Abemaciclib based on clinical trial [facility]   Letrozole toxicities: Denies any hot flashes   Bone density 07/01/2018: Normal T score 0.1   Breast cancer surveillance: 1. Breast MRI  07/18/2020: Benign breast density category B (we will skip MRI next year Sabrina obtain it in 2024.) 2.  Breast exam 07/20/2020: Benign 3.  Mammogram 01/03/2021 at [facility]: Benign, breast density category C 4. Breast MRI February 2023.  New indeterminate 0.5 cm L RUQ left breast mass biopsy 07/05/2021: Benign fat necrosis 5.  Breast MRI 07/04/2022: Benign breast density category B.  Plan to do breast MRIs every other year    After discussing pros Sabrina cons she decided not to do the Signatera test.    Abemaciclib Toxicities: Started 07/20/2020 completed 07/17/2022 1.  Diarrhea: Tolerating the dosage of  Verzenio to 100 mg twice daily very well.  The maximum diarrhea she gets is 1-2 times per week 2. slight anemia: Hemoglobin is 11.8   Return to clinic in 6 months for follow up   Orders Placed This Encounter  Procedures   MR BREAST BILATERAL W [place] CAD    Standing Status:   Future    Standing Expiration Date:   07/10/2023    Order Specific Question:   If indicated for the ordered procedure, I authorize the administration of contrast media per Radiology protocol    Answer:   Yes    Order Specific Question:   What is the patient's sedation requirement?    Answer:   No Sedation    Order Specific Question:   Does the patient have a pacemaker or implanted devices?    Answer:   No    Order Specific Question:   Preferred imaging location?    Answer:   [facility] (table limit-550lbs)   The patient has a good understanding of the overall plan. she agrees with it. she will call with any problems that may develop before the next visit here. Total time spent: 30 mins including face to face time Sabrina time spent for planning, charting Sabrina co-ordination of care   Harriette Ohara, MD 07/09/22    I Gardiner Coins am acting as a Education administrator for [employer]  I have reviewed the above documentation for accuracy Sabrina completeness, Sabrina I agree with the above.

## 2022-08-14 ENCOUNTER — Other Ambulatory Visit: Payer: Self-pay | Admitting: Hematology and Oncology

## 2022-09-25 ENCOUNTER — Encounter: Payer: Self-pay | Admitting: Hematology and Oncology

## 2022-10-11 ENCOUNTER — Other Ambulatory Visit: Payer: Self-pay | Admitting: *Deleted

## 2022-10-11 MED ORDER — ALPRAZOLAM 0.25 MG PO TABS: 0.2500 mg | ORAL_TABLET | Freq: Every evening | ORAL | 3 refills | Status: DC | PRN

## 2023-01-04 ENCOUNTER — Telehealth: Payer: Self-pay | Admitting: Hematology and Oncology

## 2023-01-07 ENCOUNTER — Inpatient Hospital Stay: Payer: 59 | Admitting: Hematology and Oncology

## 2023-01-09 ENCOUNTER — Encounter: Payer: Self-pay | Admitting: Hematology and Oncology

## 2023-01-23 ENCOUNTER — Telehealth: Payer: Self-pay | Admitting: Radiology

## 2023-01-23 ENCOUNTER — Inpatient Hospital Stay: Payer: 59 | Attending: Hematology and Oncology | Admitting: Hematology and Oncology

## 2023-01-23 VITALS — BP 123/67 | HR 63 | Temp 97.3°F | Resp 18 | Ht 66.0 in | Wt 219.6 lb

## 2023-01-23 DIAGNOSIS — Z79624 Long term (current) use of inhibitors of nucleotide synthesis: Secondary | ICD-10-CM | POA: Insufficient documentation

## 2023-01-23 DIAGNOSIS — C50212 Malignant neoplasm of upper-inner quadrant of left female breast: Secondary | ICD-10-CM

## 2023-01-23 DIAGNOSIS — Z79811 Long term (current) use of aromatase inhibitors: Secondary | ICD-10-CM | POA: Insufficient documentation

## 2023-01-23 DIAGNOSIS — Z79899 Other long term (current) drug therapy: Secondary | ICD-10-CM | POA: Diagnosis not present

## 2023-01-23 DIAGNOSIS — Z170 Estrogen receptor positive status [ER+]: Secondary | ICD-10-CM

## 2023-01-23 DIAGNOSIS — Z9221 Personal history of antineoplastic chemotherapy: Secondary | ICD-10-CM | POA: Diagnosis not present

## 2023-01-23 DIAGNOSIS — Z923 Personal history of irradiation: Secondary | ICD-10-CM | POA: Diagnosis not present

## 2023-01-23 MED ORDER — TRETINOIN 0.1 % EX CREA: TOPICAL_CREAM | Freq: Every day | CUTANEOUS | 11 refills | Status: AC

## 2023-01-23 MED ORDER — ESCITALOPRAM OXALATE 20 MG PO TABS: 20.0000 mg | ORAL_TABLET | Freq: Every day | ORAL | 3 refills | Status: DC

## 2023-01-23 MED ORDER — ALPRAZOLAM 0.25 MG PO TABS: 0.2500 mg | ORAL_TABLET | Freq: Every evening | ORAL | 3 refills | Status: DC | PRN

## 2023-01-23 NOTE — Telephone Encounter (Signed)
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 Pamelia Hoit for the above mentioned study. Encouraged patient to return call if she is interested in learning more.  Merri Brunette, RT(R)(T) Clinical Research Coordinator

## 2023-01-23 NOTE — Progress Notes (Signed)
Patient Care Team: Mila Palmer, MD as PCP - General (Family Medicine) Serena Croissant, MD as Consulting Physician (Hematology and Oncology) Dorothy Puffer, MD as Consulting Physician (Radiation Oncology) Glenna Fellows, MD (Inactive) as Consulting Physician (General Surgery)  DIAGNOSIS:  Encounter Diagnosis  Name Primary?   Malignant neoplasm of upper-inner quadrant of left breast in female, estrogen receptor positive (HCC) Yes    SUMMARY OF ONCOLOGIC HISTORY: Oncology History  Malignant neoplasm of upper-inner quadrant of left breast in female, estrogen receptor positive (HCC)  07/24/2017 Initial Diagnosis   Left breast palpable lump upper inner quadrant 11 o'clock position: Ill-defined 1.6 cm mass, axilla ultrasound negative, ultrasound-guided biopsy: Grade 2 IDC ER 90%, PR 40%, Ki-67 15%, HER-2 negative   08/09/2017 Genetic Testing   Negative genetic testing common hereditary cancer panel.  The Hereditary Gene Panel offered by [facility] includes sequencing and/or deletion duplication testing of the following 47 genes: APC, ATM, AXIN2, BARD1, BMPR1A, BRCA1, BRCA2, BRIP1, CDH1, CDK4, CDKN2A (p14ARF), CDKN2A (p16INK4a), CHEK2, CTNNA1, DICER1, EPCAM (Deletion/duplication testing only), GREM1 (promoter region deletion/duplication testing only), KIT, MEN1, MLH1, MSH2, MSH3, MSH6, MUTYH, NBN, NF1, NHTL1, PALB2, PDGFRA, PMS2, POLD1, POLE, PTEN, RAD50, RAD51C, RAD51D, SDHB, SDHC, SDHD, SMAD4, SMARCA4. STK11, TP53, TSC1, TSC2, and VHL.  The following genes were evaluated for sequence changes only: SDHA and HOXB13 c.251G>A variant only. The report date is August 07, 2017.   08/16/2017 Surgery   Left lumpectomy: IDC grade 2, 2.5 cm, DCIS intermediate grade, lymphovascular invasion identified, margins negative, 1/2 lymph nodes positive with extracapsular extension, ER 90%, PR 40%, HER-2 negative ratio 1.15, Ki-67 15% T2 N1a stage II a; Mammaprint high risk    08/16/2017 Miscellaneous   MammaPrint  08/16/17  High risk with average 10-year Risk of recurrence Untreated: 29% MPI: -0.399 94.6% benefit of chemotherapy and Hormonal therapy at 5 years from distant recurrence.    09/12/2017 - 01/31/2018 Chemotherapy   Adjuvant chemotherapy with dose dense Adriamycin and Cytoxan x4 followed by Taxol weekly x12   02/13/2018 Cancer Staging   Staging form: Breast, AJCC 8th Edition - Pathologic: Stage IB (pT2, pN1a(sn), cM0, G2, ER+, PR+, HER2-) - Signed by Serena Croissant, MD on 07/20/2020 Neoadjuvant therapy: No Method of lymph node assessment: Sentinel lymph node biopsy Multigene prognostic tests performed: MammaPrint Histologic grading system: 3 grade system Laterality: Left Tumor size (mm): 25   02/24/2018 - 04/09/2018 Radiation Therapy   Radiation therapy with Dr. Mitzi Hansen 02/24/18-04/09/18   05/13/2018 Surgery   Bilateral salpingo-oophorectomy with lysis of left adnexal adhesions   05/28/2018 -  Anti-estrogen oral therapy   Anastrozole: Discontinued due to joint pains and stiffness, switched to letrozole 07/2018   08/22/2018 Miscellaneous   Abemaciclib started 08/21/2020 dose reduced 09/05/2020     CHIEF COMPLIANT: Follow-up of left breast cancer on letrozole and Verzenio     INTERVAL HISTORY: [name] is a 42 y.o. with above-mentioned history of left breast cancer treated with lumpectomy, adjuvant chemotherapy, radiation, and bilateral salpingo-oophorectomy. She is currently on anti-estrogen therapy with letrozole and abemaciclib. She presents to the clinic today for follow-up. Patient reports that her health has been good. She is tolerating letrozole fairly well. She does have manageable hot flashes. Denies any joint stiffness. She states that she is exercising regularly. She is doing at least 30 minutes a day.   ALLERGIES:  has no allergies on file.  MEDICATIONS:  Current Outpatient Medications  Medication Sig Dispense Refill   letrozole (FEMARA) 2.5 MG tablet TAKE 1 TABLET BY  MOUTH EVERY DAY 90 tablet 3   valACYclovir (VALTREX) 1000 MG tablet TAKE 1 TABLET BY MOUTH TWICE A DAY 180 tablet 1   ALPRAZolam (XANAX) 0.25 MG tablet Take 1 tablet (0.25 mg total) by mouth at bedtime as needed for anxiety. 30 tablet 3   escitalopram (LEXAPRO) 20 MG tablet Take 1 tablet (20 mg total) by mouth daily with supper. 90 tablet 3   tretinoin (RETIN-A) 0.1 % cream Apply topically at bedtime. 45 g 11   No current facility-administered medications for this visit.   Facility-Administered Medications Ordered in Other Visits  Medication Dose Route Frequency Provider Last Rate Last Admin   heparin lock flush 100 unit/mL  500 Units Intracatheter Once PRN Serena Croissant, MD       sodium chloride flush (NS) 0.9 % injection 10 mL  10 mL Intracatheter PRN Serena Croissant, MD   10 mL at 11/15/17 1449   sodium chloride flush (NS) 0.9 % injection 10 mL  10 mL Intracatheter PRN Serena Croissant, MD       sodium chloride flush (NS) 0.9 % injection 10 mL  10 mL Intracatheter PRN Serena Croissant, MD   10 mL at 12/13/17 1500    PHYSICAL EXAMINATION: ECOG PERFORMANCE STATUS: 1 - Symptomatic but completely ambulatory  Vitals:   01/23/23 0821  BP: 123/67  Pulse: 63  Resp: 18  Temp: (!) 97.3 F (36.3 C)  SpO2: 96%   Filed Weights   01/23/23 0821  Weight: 219 lb 9.6 oz (99.6 kg)    BREAST: No palpable masses or nodules in either right or left breasts. No palpable axillary supraclavicular or infraclavicular adenopathy no breast tenderness or nipple discharge. (exam performed in the presence of a chaperone)  LABORATORY DATA:  I have reviewed the data as listed    Latest Ref Rng & Units 07/09/2022    1:51 PM 04/04/2022    2:06 PM 12/26/2021    9:55 AM  CMP  Glucose 70 - 99 mg/dL 161  99  096   BUN 6 - 20 mg/dL 14  14  16    Creatinine 0.44 - 1.00 mg/dL 0.45  4.09  8.11   Sodium 135 - 145 mmol/L 140  140  139   Potassium 3.5 - 5.1 mmol/L 3.8  4.0  4.2   Chloride 98 - 111 mmol/L 107  104  106   CO2  22 - 32 mmol/L 28  27  29    Calcium 8.9 - 10.3 mg/dL 9.3  9.3  9.1   Total Protein 6.5 - 8.1 g/dL 6.7  7.2  6.9   Total Bilirubin 0.3 - 1.2 mg/dL 0.5  0.5  0.6   Alkaline Phos 38 - 126 U/L 78  74  69   AST 15 - 41 U/L 31  25  26    ALT 0 - 44 U/L 39  27  27     Lab Results  Component Value Date   WBC 3.8 (L) 07/09/2022   HGB 11.4 (L) 07/09/2022   HCT 31.4 (L) 07/09/2022   MCV 92.9 07/09/2022   PLT 148 (L) 07/09/2022   NEUTROABS 1.9 07/09/2022    ASSESSMENT & PLAN:  Malignant neoplasm of upper-inner quadrant of left breast in female, estrogen receptor positive (HCC) 08/19/2017:Left lumpectomy: IDC grade 2, 2.5 cm, DCIS intermediate grade, lymphovascular invasion identified, margins negative, 1/2 lymph nodes positive with extracapsular extension, ER 90%, PR 40%, HER-2 negative ratio 1.15, Ki-67 15% T2 N1a stage II a; Mammaprint high risk  Recommendation: 1.  Adjuvant chemotherapy with dose dense Adriamycin and Cytoxan x4 followed by Taxol weekly x12 completed 01/31/2018 2. followed by adjuvant radiation therapy which will be completed 04/09/2018 3.  Bilateral salpingo-oophorectomy 05/15/2018  4. Followed by adjuvant antiestrogen therapy; Abemaciclib Started 07/20/2020 completed 07/17/2022 _______________________________________________________________________________________________   Current treatment: Anastrozole 1 mg daily started 06/02/2018 discontinued 07/02/2018 due to diffuse muscle aches and pains, switched to letrozole 07/14/2018  Letrozole toxicities: Denies any hot flashes   Bone density 07/01/2018: Normal T score 0.1   Breast cancer surveillance: 1. Breast exam 01/23/2023: Benign 2.  Mammogram [facility]/07/20[facility] at [facility]: Benign, breast density category C 3.  Breast MRI 07/04/2022: Benign breast density category B.  Plan to do breast MRIs every other year.  Next MRI scheduled for 07/09/2023   We decided not to [facility] testing. Her daughter is planning to go to middle school and hoping  to get into [place] or [employer] Return to clinic in 6 months for follow-up (patient prefers to come every 6 months)     No orders of the defined types were placed in this encounter.  The patient has a good understanding of the overall plan. she agrees with it. she will call with any problems that may develop before the next visit here. Total time spent: 30 mins including face to face time and time spent for planning, charting and co-ordination of care   Tamsen Meek, MD 01/23/23    I Janan Ridge am acting as a Neurosurgeon for [employer]  I have reviewed the above documentation for accuracy and completeness, and I agree with the above.

## 2023-01-23 NOTE — Assessment & Plan Note (Addendum)
08/19/2017:Left lumpectomy: IDC grade 2, 2.5 cm, DCIS intermediate grade, lymphovascular invasion identified, margins negative, 1/2 lymph nodes positive with extracapsular extension, ER 90%, PR 40%, HER-2 negative ratio 1.15, Ki-67 15% T2 N1a stage II a; Mammaprint high risk    Recommendation: 1.  Adjuvant chemotherapy with dose dense Adriamycin and Cytoxan x4 followed by Taxol weekly x12 completed 01/31/2018 2. followed by adjuvant radiation therapy which will be completed 04/09/2018 3.  Bilateral salpingo-oophorectomy 05/15/2018  4. Followed by adjuvant antiestrogen therapy; Abemaciclib Started 07/20/2020 completed 07/17/2022 _______________________________________________________________________________________________   Current treatment: Anastrozole 1 mg daily started 06/02/2018 discontinued 07/02/2018 due to diffuse muscle aches and pains, switched to letrozole 07/14/2018    Letrozole toxicities: Denies any hot flashes   Bone density 07/01/2018: Normal T score 0.1   Breast cancer surveillance: 1. Breast exam 01/23/2023: Benign 2.  Mammogram 01/07/2023 at [facility]: Benign, breast density category C 3.  Breast MRI 07/04/2022: Benign breast density category B.  Plan to do breast MRIs every other year.  Next MRI scheduled for 07/09/2023     After discussing pros and cons she decided not to do the [employer] test.     Return to clinic in 1 year for follow up

## 2023-01-24 ENCOUNTER — Other Ambulatory Visit: Payer: Self-pay | Admitting: Hematology and Oncology

## 2023-01-30 ENCOUNTER — Telehealth: Payer: Self-pay | Admitting: Hematology and Oncology

## 2023-01-30 ENCOUNTER — Other Ambulatory Visit: Payer: Self-pay | Admitting: *Deleted

## 2023-01-30 ENCOUNTER — Telehealth: Payer: Self-pay | Admitting: Radiology

## 2023-01-30 MED ORDER — VALACYCLOVIR HCL 1 G PO TABS: 1000.0000 mg | ORAL_TABLET | Freq: Two times a day (BID) | ORAL | 1 refills | Status: AC

## 2023-01-30 NOTE — Telephone Encounter (Signed)
[id number]: COGNITIVE TRAINING FOR CANCER RELATED COGNITIVE IMPAIRMENT IN BREAST CANCER SURVIVORS: A MULTI-CENTER RANDOMIZED DOUBLE- BLINDED CONTROLLED TRIAL   01/30/23  CONSENT INTRO: Confirmed I was speaking with [name] . Informed patient reason for call is to introduce the above mentioned study. This coordinator briefly described study and used the screening script provided by [id number]. Patient verbally expressed interest in participation and provided verbal consent to complete the screener. All questions were completed for Screening Assessment #1 and the final score was 12 for this assessment. Based on the score, the patient is not eligible for study. This coordinator informed patient and patient expressed understanding. Patient was thanked for her time and consideration of the above mentioned study.  Patient did request a refill for a medication. This coordinator reached out to Dr. Earmon Phoenix nurse to complete the request. This coordinator called patient back to inform her that this refill is being taken care of.   Merri Brunette, RT(R)(T) Clinical Research Coordinator

## 2023-01-30 NOTE — Telephone Encounter (Signed)
Scheduled appointment per los. Patient is aware of the made appointment. 

## 2023-05-27 ENCOUNTER — Telehealth: Payer: Self-pay | Admitting: Hematology and Oncology

## 2023-05-27 NOTE — Telephone Encounter (Signed)
Left patient a message in regards to rescheduled appointment times/dates due to provider being out of office

## 2023-07-02 ENCOUNTER — Encounter: Payer: Self-pay | Admitting: Hematology and Oncology

## 2023-07-03 ENCOUNTER — Encounter: Payer: Self-pay | Admitting: Hematology and Oncology

## 2023-07-09 ENCOUNTER — Other Ambulatory Visit: Payer: 59

## 2023-07-10 ENCOUNTER — Other Ambulatory Visit: Payer: Self-pay | Admitting: Hematology and Oncology

## 2023-07-10 ENCOUNTER — Ambulatory Visit
Admission: RE | Admit: 2023-07-10 | Discharge: 2023-07-10 | Disposition: A | Discharge status: Home / Self Care | Payer: 59 | Source: Ambulatory Visit | Attending: Hematology and Oncology

## 2023-07-10 DIAGNOSIS — C50212 Malignant neoplasm of upper-inner quadrant of left female breast: Secondary | ICD-10-CM

## 2023-07-10 MED ORDER — GADOPICLENOL 0.5 MMOL/ML IV SOLN
10.0000 mL | Freq: Once | INTRAVENOUS | Status: AC | PRN
  Administered 2023-07-10: 10 mL via INTRAVENOUS

## 2023-07-17 ENCOUNTER — Telehealth: Payer: Self-pay | Admitting: Hematology and Oncology

## 2023-07-17 NOTE — Telephone Encounter (Signed)
 Marland Kitchen

## 2023-07-25 ENCOUNTER — Inpatient Hospital Stay: Payer: 59 | Attending: Hematology and Oncology | Admitting: Hematology and Oncology

## 2023-07-25 VITALS — BP 140/75 | HR 68 | Temp 97.7°F | Resp 18 | Ht 66.0 in | Wt 225.1 lb

## 2023-07-25 DIAGNOSIS — C50212 Malignant neoplasm of upper-inner quadrant of left female breast: Secondary | ICD-10-CM | POA: Diagnosis present

## 2023-07-25 DIAGNOSIS — Z170 Estrogen receptor positive status [ER+]: Secondary | ICD-10-CM | POA: Insufficient documentation

## 2023-07-25 DIAGNOSIS — Z1732 Human epidermal growth factor receptor 2 negative status: Secondary | ICD-10-CM | POA: Insufficient documentation

## 2023-07-25 DIAGNOSIS — Z1721 Progesterone receptor positive status: Secondary | ICD-10-CM | POA: Diagnosis not present

## 2023-07-25 DIAGNOSIS — Z79811 Long term (current) use of aromatase inhibitors: Secondary | ICD-10-CM | POA: Diagnosis not present

## 2023-07-25 DIAGNOSIS — Z9221 Personal history of antineoplastic chemotherapy: Secondary | ICD-10-CM | POA: Diagnosis not present

## 2023-07-25 DIAGNOSIS — Z923 Personal history of irradiation: Secondary | ICD-10-CM | POA: Diagnosis not present

## 2023-07-25 DIAGNOSIS — Z79624 Long term (current) use of inhibitors of nucleotide synthesis: Secondary | ICD-10-CM | POA: Insufficient documentation

## 2023-07-25 DIAGNOSIS — Z79899 Other long term (current) drug therapy: Secondary | ICD-10-CM | POA: Diagnosis not present

## 2023-07-25 NOTE — Progress Notes (Signed)
 Patient Care Team: Mila Palmer, MD as PCP - General (Family Medicine) Serena Croissant, MD as Consulting Physician (Hematology and Oncology) Dorothy Puffer, MD as Consulting Physician (Radiation Oncology) Glenna Fellows, MD (Inactive) as Consulting Physician (General Surgery)  DIAGNOSIS:  Encounter Diagnosis  Name Primary?   Malignant neoplasm of upper-inner quadrant of left breast in female, estrogen receptor positive (HCC) Yes    SUMMARY OF ONCOLOGIC HISTORY: Oncology History  Malignant neoplasm of upper-inner quadrant of left breast in female, estrogen receptor positive (HCC)  07/24/2017 Initial Diagnosis   Left breast palpable lump upper inner quadrant 11 o'clock position: Ill-defined 1.6 cm mass, axilla ultrasound negative, ultrasound-guided biopsy: Grade 2 IDC ER 90%, PR 40%, Ki-67 15%, HER-2 negative   08/09/2017 Genetic Testing   Negative genetic testing common hereditary cancer panel.  The Hereditary Gene Panel offered by [facility] includes sequencing and/or deletion duplication testing of the following 47 genes: APC, ATM, AXIN2, BARD1, BMPR1A, BRCA1, BRCA2, BRIP1, CDH1, CDK4, CDKN2A (p14ARF), CDKN2A (p16INK4a), CHEK2, CTNNA1, DICER1, EPCAM (Deletion/duplication testing only), GREM1 (promoter region deletion/duplication testing only), KIT, MEN1, MLH1, MSH2, MSH3, MSH6, MUTYH, NBN, NF1, NHTL1, PALB2, PDGFRA, PMS2, POLD1, POLE, PTEN, RAD50, RAD51C, RAD51D, SDHB, SDHC, SDHD, SMAD4, SMARCA4. STK11, TP53, TSC1, TSC2, and VHL.  The following genes were evaluated for sequence changes only: SDHA and HOXB13 c.251G>A variant only. The report date is August 07, 2017.   08/16/2017 Surgery   Left lumpectomy: IDC grade 2, 2.5 cm, DCIS intermediate grade, lymphovascular invasion identified, margins negative, 1/2 lymph nodes positive with extracapsular extension, ER 90%, PR 40%, HER-2 negative ratio 1.15, Ki-67 15% T2 N1a stage II a; Mammaprint high risk    08/16/2017 Miscellaneous   MammaPrint  08/16/17  High risk with average 10-year Risk of recurrence Untreated: 29% MPI: -0.399 94.6% benefit of chemotherapy and Hormonal therapy at 5 years from distant recurrence.    09/12/2017 - 01/31/2018 Chemotherapy   Adjuvant chemotherapy with dose dense Adriamycin and Cytoxan x4 followed by Taxol weekly x12   02/13/2018 Cancer Staging   Staging form: Breast, AJCC 8th Edition - Pathologic: Stage IB (pT2, pN1a(sn), cM0, G2, ER+, PR+, HER2-) - Signed by Serena Croissant, MD on 07/20/2020 Neoadjuvant therapy: No Method of lymph node assessment: Sentinel lymph node biopsy Multigene prognostic tests performed: MammaPrint Histologic grading system: 3 grade system Laterality: Left Tumor size (mm): 25   02/24/2018 - 04/09/2018 Radiation Therapy   Radiation therapy with Dr. Mitzi Hansen 02/24/18-04/09/18   05/13/2018 Surgery   Bilateral salpingo-oophorectomy with lysis of left adnexal adhesions   05/28/2018 -  Anti-estrogen oral therapy   Anastrozole: Discontinued due to joint pains and stiffness, switched to letrozole 07/2018   08/22/2018 Miscellaneous   Abemaciclib started 08/21/2020 dose reduced 09/05/2020     CHIEF COMPLIANT: Follow-up and surveillance of breast cancer  HISTORY OF PRESENT ILLNESS: [name] is a 43 year old female with breast cancer who presents for routine follow-up.  She has been undergoing regular surveillance with annual mammograms and MRIs, which have been unremarkable. Her breast density is low, categorized as B.  She is currently on letrozole and is tolerating it well without any issues related to medication adherence or side effects.    ALLERGIES:  has no allergies on file.  MEDICATIONS:  Current Outpatient Medications  Medication Sig Dispense Refill   ALPRAZolam (XANAX) 0.25 MG tablet TAKE 1 TABLET BY MOUTH AT BEDTIME AS NEEDED FOR ANXIETY. 30 tablet 3   escitalopram (LEXAPRO) 20 MG tablet Take 1 tablet (20 mg total)  by mouth daily with supper. 90 tablet 3    letrozole (FEMARA) 2.5 MG tablet TAKE 1 TABLET BY MOUTH EVERY DAY 90 tablet 3   tretinoin (RETIN-A) 0.1 % cream Apply topically at bedtime. 45 g 11   valACYclovir (VALTREX) 1000 MG tablet Take 1 tablet (1,000 mg total) by mouth 2 (two) times daily. 180 tablet 1   No current facility-administered medications for this visit.   Facility-Administered Medications Ordered in Other Visits  Medication Dose Route Frequency Provider Last Rate Last Admin   heparin lock flush 100 unit/mL  500 Units Intracatheter Once PRN Serena Croissant, MD       sodium chloride flush (NS) 0.9 % injection 10 mL  10 mL Intracatheter PRN Serena Croissant, MD   10 mL at 11/15/17 1449   sodium chloride flush (NS) 0.9 % injection 10 mL  10 mL Intracatheter PRN Serena Croissant, MD       sodium chloride flush (NS) 0.9 % injection 10 mL  10 mL Intracatheter PRN Serena Croissant, MD   10 mL at 12/13/17 1500    PHYSICAL EXAMINATION: ECOG PERFORMANCE STATUS: 1 - Symptomatic but completely ambulatory  Vitals:   07/25/23 0900  BP: (!) 140/75  Pulse: 68  Resp: 18  Temp: 97.7 F (36.5 C)  SpO2: 98%   Filed Weights   07/25/23 0900  Weight: 225 lb 1.6 oz (102.1 kg)      LABORATORY DATA:  I have reviewed the data as listed    Latest Ref Rng & Units 07/09/2022    1:51 PM 04/04/2022    2:06 PM 12/26/2021    9:55 AM  CMP  Glucose 70 - 99 mg/dL 161  99  096   BUN 6 - 20 mg/dL 14  14  16    Creatinine 0.44 - 1.00 mg/dL 0.45  4.09  8.11   Sodium 135 - 145 mmol/L 140  140  139   Potassium 3.5 - 5.1 mmol/L 3.8  4.0  4.2   Chloride 98 - 111 mmol/L 107  104  106   CO2 22 - 32 mmol/L 28  27  29    Calcium 8.9 - 10.3 mg/dL 9.3  9.3  9.1   Total Protein 6.5 - 8.1 g/dL 6.7  7.2  6.9   Total Bilirubin 0.3 - 1.2 mg/dL 0.5  0.5  0.6   Alkaline Phos 38 - 126 U/L 78  74  69   AST 15 - 41 U/L 31  25  26    ALT 0 - 44 U/L 39  27  27     Lab Results  Component Value Date   WBC 3.8 (L) 07/09/2022   HGB 11.4 (L) 07/09/2022   HCT 31.4 (L)  07/09/2022   MCV 92.9 07/09/2022   PLT 148 (L) 07/09/2022   NEUTROABS 1.9 07/09/2022    ASSESSMENT & PLAN:  Malignant neoplasm of upper-inner quadrant of left breast in female, estrogen receptor positive (HCC) 08/19/2017:Left lumpectomy: IDC grade 2, 2.5 cm, DCIS intermediate grade, lymphovascular invasion identified, margins negative, 1/2 lymph nodes positive with extracapsular extension, ER 90%, PR 40%, HER-2 negative ratio 1.15, Ki-67 15% T2 N1a stage II a; Mammaprint high risk    Recommendation: 1.  Adjuvant chemotherapy with dose dense Adriamycin and Cytoxan x4 followed by Taxol weekly x12 completed 01/31/2018 2. followed by adjuvant radiation therapy which will be completed 04/09/2018 3.  Bilateral salpingo-oophorectomy 05/15/2018  4. Followed by adjuvant antiestrogen therapy; Abemaciclib Started 07/20/2020 completed 07/17/2022 _______________________________________________________________________________________________   Current  treatment: Anastrozole 1 mg daily started 06/02/2018 discontinued 07/02/2018 due to diffuse muscle aches and pains, switched to letrozole 07/14/2018   Letrozole toxicities: Denies any hot flashes   Bone density 07/01/2018: Normal T score 0.1   Breast cancer surveillance: 1. Breast exam 07/25/2023: Benign 2.  Mamm[facility] 01/07/2023 at [facility]: Benign, breast density category C 3.  Breast MRI 07/10/2023: Benign breast density category B.  Plan to do breast MRIs every other year.     We decided not to [facility] testing. Her daughter managed to get admission at [facility].  She is very happy about that.  Return to clinic in 6 months for follow-up   No orders of the defined types were placed in this encounter.  The patient has a good understanding of the overall plan. she agrees with it. she will call with any problems that may develop before the next visit here. Total time spent: 30 mins including face to face time and time spent for planning, charting and co-ordination  of care   Tamsen Meek, MD 07/25/23

## 2023-07-25 NOTE — Assessment & Plan Note (Signed)
 08/19/2017:Left lumpectomy: IDC grade 2, 2.5 cm, DCIS intermediate grade, lymphovascular invasion identified, margins negative, 1/2 lymph nodes positive with extracapsular extension, ER 90%, PR 40%, HER-2 negative ratio 1.15, Ki-67 15% T2 N1a stage II a; Mammaprint high risk    Recommendation: 1.  Adjuvant chemotherapy with dose dense Adriamycin and Cytoxan x4 followed by Taxol weekly x12 completed 01/31/2018 2. followed by adjuvant radiation therapy which will be completed 04/09/2018 3.  Bilateral salpingo-oophorectomy 05/15/2018  4. Followed by adjuvant antiestrogen therapy; Abemaciclib Started 07/20/2020 completed 07/17/2022 _______________________________________________________________________________________________   Current treatment: Anastrozole 1 mg daily started 06/02/2018 discontinued 07/02/2018 due to diffuse muscle aches and pains, switched to letrozole 07/14/2018   Letrozole toxicities: Denies any hot flashes   Bone density 07/01/2018: Normal T score 0.1   Breast cancer surveillance: 1. Breast exam 07/25/2023: Benign 2.  Mammogram 01/07/2023 at [facility]: Benign, breast density category C 3.  Breast MRI 07/10/2023: Benign breast density category B.  Plan to do breast MRIs every other year.     We decided not to [facility] testing. Her daughter is planning to go to middle school and hoping to get into [place] or [employer] Return to clinic in 6 months for follow-up (patient prefers to come every 6 months)

## 2023-07-26 ENCOUNTER — Ambulatory Visit: Payer: 59 | Admitting: Hematology and Oncology

## 2023-08-20 ENCOUNTER — Other Ambulatory Visit: Payer: Self-pay | Admitting: Hematology and Oncology

## 2023-08-21 ENCOUNTER — Telehealth: Payer: Self-pay | Admitting: *Deleted

## 2023-08-21 NOTE — Telephone Encounter (Signed)
 Received call from pt requesting advice from MD If okay to start a GLP1 medication for weight loss.  Per MD, okay to proceed. Pt  states she will reach out too PCP.

## 2023-12-17 ENCOUNTER — Telehealth: Payer: Self-pay | Admitting: *Deleted

## 2023-12-17 NOTE — Telephone Encounter (Signed)
 Faxed order for [facility] to [facility] at [phone]

## 2024-01-13 ENCOUNTER — Encounter: Payer: Self-pay | Admitting: Hematology and Oncology

## 2024-01-16 ENCOUNTER — Encounter: Payer: Self-pay | Admitting: Hematology and Oncology

## 2024-01-23 ENCOUNTER — Inpatient Hospital Stay: Payer: 59 | Admitting: Hematology and Oncology

## 2024-02-10 ENCOUNTER — Inpatient Hospital Stay: Payer: Self-pay | Attending: Hematology and Oncology | Admitting: Hematology and Oncology

## 2024-02-10 ENCOUNTER — Encounter: Payer: Self-pay | Admitting: Hematology and Oncology

## 2024-02-10 DIAGNOSIS — Z780 Asymptomatic menopausal state: Secondary | ICD-10-CM

## 2024-02-10 DIAGNOSIS — Z923 Personal history of irradiation: Secondary | ICD-10-CM | POA: Insufficient documentation

## 2024-02-10 DIAGNOSIS — C50212 Malignant neoplasm of upper-inner quadrant of left female breast: Secondary | ICD-10-CM | POA: Insufficient documentation

## 2024-02-10 DIAGNOSIS — Z9221 Personal history of antineoplastic chemotherapy: Secondary | ICD-10-CM | POA: Insufficient documentation

## 2024-02-10 DIAGNOSIS — Z9079 Acquired absence of other genital organ(s): Secondary | ICD-10-CM | POA: Insufficient documentation

## 2024-02-10 DIAGNOSIS — Z79624 Long term (current) use of inhibitors of nucleotide synthesis: Secondary | ICD-10-CM | POA: Insufficient documentation

## 2024-02-10 DIAGNOSIS — Z170 Estrogen receptor positive status [ER+]: Secondary | ICD-10-CM | POA: Diagnosis not present

## 2024-02-10 DIAGNOSIS — Z1721 Progesterone receptor positive status: Secondary | ICD-10-CM | POA: Diagnosis not present

## 2024-02-10 DIAGNOSIS — Z90722 Acquired absence of ovaries, bilateral: Secondary | ICD-10-CM | POA: Diagnosis not present

## 2024-02-10 DIAGNOSIS — Z79811 Long term (current) use of aromatase inhibitors: Secondary | ICD-10-CM | POA: Diagnosis not present

## 2024-02-10 DIAGNOSIS — Z17411 Hormone receptor positive with human epidermal growth factor receptor 2 negative status: Secondary | ICD-10-CM | POA: Diagnosis not present

## 2024-02-10 DIAGNOSIS — Z79899 Other long term (current) drug therapy: Secondary | ICD-10-CM | POA: Insufficient documentation

## 2024-02-10 NOTE — Progress Notes (Signed)
 Patient Care Team: Verena Mems, MD as PCP - General (Family Medicine) Odean Potts, MD as Consulting Physician (Hematology and Oncology) Dewey Rush, MD as Consulting Physician (Radiation Oncology)  DIAGNOSIS:  Encounter Diagnosis  Name Primary?   Malignant neoplasm of upper-inner quadrant of left breast in female, estrogen receptor positive (HCC) Yes    SUMMARY OF ONCOLOGIC HISTORY: Oncology History  Malignant neoplasm of upper-inner quadrant of left breast in female, estrogen receptor positive (HCC)  07/24/2017 Initial Diagnosis   Left breast palpable lump upper inner quadrant 11 o'clock position: Ill-defined 1.6 cm mass, axilla ultrasound negative, ultrasound-guided biopsy: Grade 2 IDC ER 90%, PR 40%, Ki-67 15%, HER-2 negative   08/09/2017 Genetic Testing   Negative genetic testing common hereditary cancer panel.  The Hereditary Gene Panel offered by [facility] includes sequencing and/or deletion duplication testing of the following 47 genes: APC, ATM, AXIN2, BARD1, BMPR1A, BRCA1, BRCA2, BRIP1, CDH1, CDK4, CDKN2A (p14ARF), CDKN2A (p16INK4a), CHEK2, CTNNA1, DICER1, EPCAM (Deletion/duplication testing only), GREM1 (promoter region deletion/duplication testing only), KIT, MEN1, MLH1, MSH2, MSH3, MSH6, MUTYH, NBN, NF1, NHTL1, PALB2, PDGFRA, PMS2, POLD1, POLE, PTEN, RAD50, RAD51C, RAD51D, SDHB, SDHC, SDHD, SMAD4, SMARCA4. STK11, TP53, TSC1, TSC2, and VHL.  The following genes were evaluated for sequence changes only: SDHA and HOXB13 c.251G>A variant only. The report date is August 07, 2017.   08/16/2017 Surgery   Left lumpectomy: IDC grade 2, 2.5 cm, DCIS intermediate grade, lymphovascular invasion identified, margins negative, 1/2 lymph nodes positive with extracapsular extension, ER 90%, PR 40%, HER-2 negative ratio 1.15, Ki-67 15% T2 N1a stage II a; Mammaprint high risk    08/16/2017 Miscellaneous   MammaPrint 08/16/17  High risk with average 10-year Risk of recurrence Untreated: 29% MPI:  -0.399 94.6% benefit of chemotherapy and Hormonal therapy at 5 years from distant recurrence.    09/12/2017 - 01/31/2018 Chemotherapy   Adjuvant chemotherapy with dose dense Adriamycin  and Cytoxan  x4 followed by Taxol  weekly x12   02/13/2018 Cancer Staging   Staging form: Breast, AJCC 8th Edition - Pathologic: Stage IB (pT2, pN1a(sn), cM0, G2, ER+, PR+, HER2-) - Signed by Odean Potts, MD on 07/20/2020 Neoadjuvant therapy: No Method of lymph node assessment: Sentinel lymph node biopsy Multigene prognostic tests performed: MammaPrint Histologic grading system: 3 grade system Laterality: Left Tumor size (mm): 25   02/24/2018 - 04/09/2018 Radiation Therapy   Radiation therapy with Dr. Dewey 02/24/18-04/09/18   05/13/2018 Surgery   Bilateral salpingo-oophorectomy with lysis of left adnexal adhesions   05/28/2018 -  Anti-estrogen oral therapy   Anastrozole : Discontinued due to joint pains and stiffness, switched to letrozole  07/2018   08/22/2018 Miscellaneous   Abemaciclib  started 08/21/2020 dose reduced 09/05/2020     CHIEF COMPLIANT: Surveillance of breast cancer  HISTORY OF PRESENT ILLNESS:   History of Present Illness ABBEY [name] is a 43 year old female with breast cancer who presents for follow-up on her current treatment regimen.  She is currently on letrozole  for hormone therapy, which she has been tolerating well since February 2020. Previously, she experienced joint pain with a different medication, prompting the switch to letrozole .  A recent contrast mammogram provided a clear image. An MRI was performed earlier this year. There is consideration of using blood-based monitoring tests such as Signatera and Gardant, though she has anxiety about potential false positives and implications of recurrence detection. A CT scan at the time of her original diagnosis showed no evidence of metastasis.     ALLERGIES:  has no allergies on file.  MEDICATIONS:  Current Outpatient  Medications  Medication Sig Dispense Refill   ALPRAZolam  (XANAX ) 0.25 MG tablet TAKE 1 TABLET BY MOUTH AT BEDTIME AS NEEDED FOR ANXIETY. 30 tablet 3   escitalopram  (LEXAPRO ) 20 MG tablet Take 1 tablet (20 mg total) by mouth daily with supper. 90 tablet 3   letrozole  (FEMARA ) 2.5 MG tablet TAKE 1 TABLET BY MOUTH EVERY DAY 90 tablet 3   tirzepatide (ZEPBOUND) 7.5 MG/0.5ML Pen Inject 7.5 mg into the skin once a week.     tretinoin  (RETIN-A ) 0.1 % cream Apply topically at bedtime. 45 g 11   valACYclovir  (VALTREX ) 1000 MG tablet Take 1 tablet (1,000 mg total) by mouth 2 (two) times daily. 180 tablet 1   No current facility-administered medications for this visit.   Facility-Administered Medications Ordered in Other Visits  Medication Dose Route Frequency Provider Last Rate Last Admin   heparin  lock flush 100 unit/mL  500 Units Intracatheter Once PRN Odean Potts, MD       sodium chloride  flush (NS) 0.9 % injection 10 mL  10 mL Intracatheter PRN Odean Potts, MD   10 mL at 11/15/17 1449   sodium chloride  flush (NS) 0.9 % injection 10 mL  10 mL Intracatheter PRN Odean Potts, MD       sodium chloride  flush (NS) 0.9 % injection 10 mL  10 mL Intracatheter PRN Odean Potts, MD   10 mL at 12/13/17 1500    PHYSICAL EXAMINATION: ECOG PERFORMANCE STATUS: 1 - Symptomatic but completely ambulatory  There were no vitals filed for this visit. There were no vitals filed for this visit.  Physical Exam No palpable lumps around the bilateral breasts or axilla  (exam performed in the presence of a chaperone)  LABORATORY DATA:  I have reviewed the data as listed    Latest Ref Rng & Units 07/09/2022    1:51 PM 04/04/2022    2:06 PM 12/26/2021    9:55 AM  CMP  Glucose 70 - 99 mg/dL 887  99  899   BUN 6 - 20 mg/dL 14  14  16    Creatinine 0.44 - 1.00 mg/dL 8.75  8.73  8.83   Sodium 135 - 145 mmol/L 140  140  139   Potassium 3.5 - 5.1 mmol/L 3.8  4.0  4.2   Chloride 98 - 111 mmol/L 107  104  106   CO2  22 - 32 mmol/L 28  27  29    Calcium 8.9 - 10.3 mg/dL 9.3  9.3  9.1   Total Protein 6.5 - 8.1 g/dL 6.7  7.2  6.9   Total Bilirubin 0.3 - 1.2 mg/dL 0.5  0.5  0.6   Alkaline Phos 38 - 126 U/L 78  74  69   AST 15 - 41 U/L 31  25  26    ALT 0 - 44 U/L 39  27  27     Lab Results  Component Value Date   WBC 3.8 (L) 07/09/2022   HGB 11.4 (L) 07/09/2022   HCT 31.4 (L) 07/09/2022   MCV 92.9 07/09/2022   PLT 148 (L) 07/09/2022   NEUTROABS 1.9 07/09/2022    ASSESSMENT & PLAN:  Malignant neoplasm of upper-inner quadrant of left breast in female, estrogen receptor positive (HCC) 08/19/2017:Left lumpectomy: IDC grade 2, 2.5 cm, DCIS intermediate grade, lymphovascular invasion identified, margins negative, 1/2 lymph nodes positive with extracapsular extension, ER 90%, PR 40%, HER-2 negative ratio 1.15, Ki-67 15% T2 N1a stage II a; Mammaprint high  risk    Recommendation: 1.  Adjuvant chemotherapy with dose dense Adriamycin  and Cytoxan  x4 followed by Taxol  weekly x12 completed 01/31/2018 2. followed by adjuvant radiation therapy which will be completed 04/09/2018 3.  Bilateral salpingo-oophorectomy 05/15/2018  4. Followed by adjuvant antiestrogen therapy; Abemaciclib  Started 07/20/2020 completed 07/17/2022 _______________________________________________________________________________________________   Current treatment: Anastrozole  1 mg daily started 06/02/2018 discontinued 07/02/2018 due to diffuse muscle aches and pains, switched to letrozole  07/14/2018   Letrozole  toxicities: Denies any hot flashes   Bone density 07/01/2018: Normal T score 0.1   Breast cancer surveillance: 1. Breast exam 07/25/2023: Benign 2.  Mammogram 01/08/2024 at [facility]: Benign, breast density category B 3.  Breast MRI 07/10/2023: Benign breast density category B.  Since she is doing contrast-enhanced mammograms at [facility], we do not need to repeat MRIs.   We decided not to Signatera/guardant reveal testing. Her daughter managed to get  admission at [facility].  She started August 2025.  We would like to repeat another bone density test in 3 months.  Return to clinic in 6 months for follow-up    No orders of the defined types were placed in this encounter.  The patient has a good understanding of the overall plan. she agrees with it. she will call with any problems that may develop before the next visit here. Total time spent: 30 mins including face to face time and time spent for planning, charting and co-ordination of care   Sabrina MARLA Chad, MD 02/10/24

## 2024-02-10 NOTE — Assessment & Plan Note (Signed)
 08/19/2017:Left lumpectomy: IDC grade 2, 2.5 cm, DCIS intermediate grade, lymphovascular invasion identified, margins negative, 1/2 lymph nodes positive with extracapsular extension, ER 90%, PR 40%, HER-2 negative ratio 1.15, Ki-67 15% T2 N1a stage II a; Mammaprint high risk    Recommendation: 1.  Adjuvant chemotherapy with dose dense Adriamycin  and Cytoxan  x4 followed by Taxol  weekly x12 completed 01/31/2018 2. followed by adjuvant radiation therapy which will be completed 04/09/2018 3.  Bilateral salpingo-oophorectomy 05/15/2018  4. Followed by adjuvant antiestrogen therapy; Abemaciclib  Started 07/20/2020 completed 07/17/2022 _______________________________________________________________________________________________   Current treatment: Anastrozole  1 mg daily started 06/02/2018 discontinued 07/02/2018 due to diffuse muscle aches and pains, switched to letrozole  07/14/2018   Letrozole  toxicities: Denies any hot flashes   Bone density 07/01/2018: Normal T score 0.1   Breast cancer surveillance: 1. Breast exam 07/25/2023: Benign 2.  Mammogram 01/08/2024 at [facility]: Benign, breast density category B 3.  Breast MRI 07/10/2023: Benign breast density category B.  Plan to do breast MRIs every other year.     We decided not to Signatera testing. Her daughter managed to get admission at [facility].  She is very happy about that.   Return to clinic in 6 months for follow-up

## 2024-04-01 ENCOUNTER — Other Ambulatory Visit: Payer: Self-pay | Admitting: Hematology and Oncology

## 2024-04-22 ENCOUNTER — Other Ambulatory Visit: Payer: Self-pay | Admitting: Hematology and Oncology

## 2024-04-22 NOTE — Telephone Encounter (Signed)
 Reviewed last office note, no mention of Alprazolam  .25 mg. Please review and refill if okay.

## 2024-08-10 ENCOUNTER — Ambulatory Visit: Admitting: Hematology and Oncology
